# Patient Record
Sex: Female | Born: 1945 | Race: White | Hispanic: No | Smoking: Former smoker
Health system: Southern US, Community
[De-identification: ages and names within clinical notes are randomized; demographics above are authoritative.]

## PROBLEM LIST (undated history)

## (undated) DIAGNOSIS — F419 Anxiety disorder, unspecified: Secondary | ICD-10-CM

## (undated) DIAGNOSIS — H269 Unspecified cataract: Secondary | ICD-10-CM

## (undated) DIAGNOSIS — I10 Essential (primary) hypertension: Secondary | ICD-10-CM

## (undated) DIAGNOSIS — E7800 Pure hypercholesterolemia, unspecified: Secondary | ICD-10-CM

## (undated) DIAGNOSIS — F329 Major depressive disorder, single episode, unspecified: Secondary | ICD-10-CM

## (undated) DIAGNOSIS — F32A Depression, unspecified: Secondary | ICD-10-CM

## (undated) DIAGNOSIS — E079 Disorder of thyroid, unspecified: Secondary | ICD-10-CM

## (undated) HISTORY — DX: Unspecified cataract: H26.9

## (undated) HISTORY — DX: Anxiety disorder, unspecified: F41.9

## (undated) HISTORY — PX: TONSILLECTOMY: SUR1361

## (undated) HISTORY — PX: POLYPECTOMY: SHX149

## (undated) HISTORY — PX: REPLACEMENT TOTAL KNEE: SUR1224

## (undated) HISTORY — PX: CHOLECYSTECTOMY: SHX55

## (undated) HISTORY — PX: COLONOSCOPY: SHX174

---

## 1999-01-08 ENCOUNTER — Emergency Department (HOSPITAL_COMMUNITY): Admission: EM | Admit: 1999-01-08 | Discharge: 1999-01-08 | Payer: Self-pay | Admitting: Internal Medicine

## 1999-01-08 ENCOUNTER — Encounter: Payer: Self-pay | Admitting: Internal Medicine

## 1999-04-28 ENCOUNTER — Other Ambulatory Visit: Admission: RE | Admit: 1999-04-28 | Discharge: 1999-04-28 | Payer: Self-pay | Admitting: Obstetrics and Gynecology

## 1999-04-28 ENCOUNTER — Encounter (INDEPENDENT_AMBULATORY_CARE_PROVIDER_SITE_OTHER): Payer: Self-pay | Admitting: Specialist

## 1999-11-02 ENCOUNTER — Other Ambulatory Visit: Admission: RE | Admit: 1999-11-02 | Discharge: 1999-11-02 | Payer: Self-pay | Admitting: Obstetrics and Gynecology

## 1999-11-18 ENCOUNTER — Encounter: Payer: Self-pay | Admitting: Internal Medicine

## 1999-11-18 ENCOUNTER — Ambulatory Visit (HOSPITAL_COMMUNITY): Admission: RE | Admit: 1999-11-18 | Discharge: 1999-11-18 | Payer: Self-pay | Admitting: Internal Medicine

## 1999-11-19 ENCOUNTER — Ambulatory Visit (HOSPITAL_COMMUNITY): Admission: RE | Admit: 1999-11-19 | Discharge: 1999-11-19 | Payer: Self-pay | Admitting: Internal Medicine

## 1999-11-19 ENCOUNTER — Encounter: Payer: Self-pay | Admitting: Internal Medicine

## 2000-01-08 ENCOUNTER — Encounter (INDEPENDENT_AMBULATORY_CARE_PROVIDER_SITE_OTHER): Payer: Self-pay | Admitting: *Deleted

## 2000-01-08 ENCOUNTER — Ambulatory Visit (HOSPITAL_COMMUNITY): Admission: RE | Admit: 2000-01-08 | Discharge: 2000-01-08 | Payer: Self-pay | Admitting: *Deleted

## 2000-09-01 ENCOUNTER — Other Ambulatory Visit: Admission: RE | Admit: 2000-09-01 | Discharge: 2000-09-01 | Payer: Self-pay | Admitting: Urology

## 2000-09-06 ENCOUNTER — Other Ambulatory Visit: Admission: RE | Admit: 2000-09-06 | Discharge: 2000-09-06 | Payer: Self-pay | Admitting: Urology

## 2000-09-12 ENCOUNTER — Encounter: Payer: Self-pay | Admitting: General Surgery

## 2000-09-12 ENCOUNTER — Encounter: Admission: RE | Admit: 2000-09-12 | Discharge: 2000-09-12 | Payer: Self-pay | Admitting: General Surgery

## 2000-09-15 ENCOUNTER — Encounter: Payer: Self-pay | Admitting: General Surgery

## 2000-09-16 ENCOUNTER — Observation Stay (HOSPITAL_COMMUNITY): Admission: RE | Admit: 2000-09-16 | Discharge: 2000-09-17 | Payer: Self-pay | Admitting: General Surgery

## 2000-09-16 ENCOUNTER — Encounter (INDEPENDENT_AMBULATORY_CARE_PROVIDER_SITE_OTHER): Payer: Self-pay | Admitting: Specialist

## 2000-12-03 ENCOUNTER — Ambulatory Visit (HOSPITAL_COMMUNITY): Admission: RE | Admit: 2000-12-03 | Discharge: 2000-12-03 | Payer: Self-pay | Admitting: Endocrinology

## 2000-12-03 ENCOUNTER — Encounter: Payer: Self-pay | Admitting: Endocrinology

## 2000-12-08 ENCOUNTER — Other Ambulatory Visit: Admission: RE | Admit: 2000-12-08 | Discharge: 2000-12-08 | Payer: Self-pay | Admitting: Obstetrics and Gynecology

## 2000-12-13 ENCOUNTER — Encounter: Admission: RE | Admit: 2000-12-13 | Discharge: 2000-12-23 | Payer: Self-pay | Admitting: Orthopedic Surgery

## 2000-12-29 ENCOUNTER — Emergency Department (HOSPITAL_COMMUNITY): Admission: EM | Admit: 2000-12-29 | Discharge: 2000-12-29 | Payer: Self-pay | Admitting: *Deleted

## 2001-04-11 ENCOUNTER — Encounter: Payer: Self-pay | Admitting: Endocrinology

## 2001-04-11 ENCOUNTER — Encounter: Admission: RE | Admit: 2001-04-11 | Discharge: 2001-04-11 | Payer: Self-pay | Admitting: Endocrinology

## 2002-01-02 ENCOUNTER — Other Ambulatory Visit: Admission: RE | Admit: 2002-01-02 | Discharge: 2002-01-02 | Payer: Self-pay | Admitting: Obstetrics and Gynecology

## 2002-03-26 ENCOUNTER — Emergency Department (HOSPITAL_COMMUNITY): Admission: EM | Admit: 2002-03-26 | Discharge: 2002-03-26 | Payer: Self-pay | Admitting: Emergency Medicine

## 2002-03-26 ENCOUNTER — Encounter: Payer: Self-pay | Admitting: Emergency Medicine

## 2003-05-09 ENCOUNTER — Other Ambulatory Visit: Admission: RE | Admit: 2003-05-09 | Discharge: 2003-05-09 | Payer: Self-pay | Admitting: Obstetrics and Gynecology

## 2004-01-02 ENCOUNTER — Emergency Department (HOSPITAL_COMMUNITY): Admission: EM | Admit: 2004-01-02 | Discharge: 2004-01-02 | Payer: Self-pay | Admitting: Emergency Medicine

## 2004-03-30 ENCOUNTER — Other Ambulatory Visit: Admission: RE | Admit: 2004-03-30 | Discharge: 2004-03-30 | Payer: Self-pay | Admitting: Obstetrics and Gynecology

## 2005-04-08 ENCOUNTER — Other Ambulatory Visit: Admission: RE | Admit: 2005-04-08 | Discharge: 2005-04-08 | Payer: Self-pay | Admitting: Obstetrics and Gynecology

## 2005-10-27 ENCOUNTER — Ambulatory Visit: Payer: Self-pay | Admitting: Internal Medicine

## 2005-11-01 ENCOUNTER — Encounter (INDEPENDENT_AMBULATORY_CARE_PROVIDER_SITE_OTHER): Payer: Self-pay | Admitting: *Deleted

## 2005-11-01 ENCOUNTER — Ambulatory Visit: Payer: Self-pay | Admitting: Internal Medicine

## 2008-10-04 HISTORY — PX: JOINT REPLACEMENT: SHX530

## 2009-08-08 ENCOUNTER — Ambulatory Visit (HOSPITAL_BASED_OUTPATIENT_CLINIC_OR_DEPARTMENT_OTHER): Admission: RE | Admit: 2009-08-08 | Discharge: 2009-08-08 | Payer: Self-pay | Admitting: Specialist

## 2009-09-02 ENCOUNTER — Emergency Department (HOSPITAL_COMMUNITY): Admission: EM | Admit: 2009-09-02 | Discharge: 2009-09-02 | Payer: Self-pay | Admitting: Emergency Medicine

## 2009-10-04 HISTORY — PX: FOOT SURGERY: SHX648

## 2009-11-28 ENCOUNTER — Inpatient Hospital Stay (HOSPITAL_COMMUNITY): Admission: AD | Admit: 2009-11-28 | Discharge: 2009-12-02 | Payer: Self-pay | Admitting: Specialist

## 2009-12-16 ENCOUNTER — Ambulatory Visit: Admission: RE | Admit: 2009-12-16 | Discharge: 2009-12-16 | Payer: Self-pay | Admitting: Specialist

## 2009-12-16 ENCOUNTER — Encounter (INDEPENDENT_AMBULATORY_CARE_PROVIDER_SITE_OTHER): Payer: Self-pay | Admitting: Specialist

## 2009-12-16 ENCOUNTER — Ambulatory Visit: Payer: Self-pay | Admitting: Surgery

## 2010-02-26 ENCOUNTER — Telehealth: Payer: Self-pay | Admitting: Internal Medicine

## 2010-02-27 ENCOUNTER — Encounter (INDEPENDENT_AMBULATORY_CARE_PROVIDER_SITE_OTHER): Payer: Self-pay | Admitting: *Deleted

## 2010-02-27 ENCOUNTER — Telehealth: Payer: Self-pay | Admitting: Internal Medicine

## 2010-04-10 ENCOUNTER — Encounter (INDEPENDENT_AMBULATORY_CARE_PROVIDER_SITE_OTHER): Payer: Self-pay | Admitting: *Deleted

## 2010-04-15 ENCOUNTER — Ambulatory Visit: Payer: Self-pay | Admitting: Internal Medicine

## 2010-04-15 ENCOUNTER — Encounter (INDEPENDENT_AMBULATORY_CARE_PROVIDER_SITE_OTHER): Payer: Self-pay | Admitting: *Deleted

## 2010-04-22 ENCOUNTER — Ambulatory Visit: Payer: Self-pay | Admitting: Internal Medicine

## 2010-04-24 ENCOUNTER — Encounter: Payer: Self-pay | Admitting: Internal Medicine

## 2010-04-28 ENCOUNTER — Encounter: Admission: RE | Admit: 2010-04-28 | Discharge: 2010-04-28 | Payer: Self-pay | Admitting: Otolaryngology

## 2010-10-23 ENCOUNTER — Ambulatory Visit
Admission: RE | Admit: 2010-10-23 | Discharge: 2010-10-23 | Payer: Self-pay | Source: Home / Self Care | Attending: Specialist | Admitting: Specialist

## 2010-11-02 ENCOUNTER — Ambulatory Visit
Admission: RE | Admit: 2010-11-02 | Discharge: 2010-11-03 | Payer: Self-pay | Source: Home / Self Care | Attending: Specialist | Admitting: Specialist

## 2010-11-02 LAB — POCT I-STAT 4, (NA,K, GLUC, HGB,HCT)
Glucose, Bld: 89 mg/dL (ref 70–99)
HCT: 44 % (ref 36.0–46.0)
Hemoglobin: 15 g/dL (ref 12.0–15.0)
Sodium: 143 mEq/L (ref 135–145)

## 2010-11-03 NOTE — Letter (Signed)
Summary: Previsit letter  [facility]  [address] [place], [place] [address]   Phone: [phone]  Fax: [phone]       02/27/2010 MRN: [id number]  Dawn Kim [address] [place], [place]  [address]  Dear [name],  Welcome to the Gastroenterology Division at [facility].    You are scheduled to see a nurse for your pre-procedure visit on 04-15-10 at 10am, on the 3rd floor at [facility], [address]  We ask that you try to arrive at our office 15 minutes prior to your appointment time to allow for check-in.  Your nurse visit will consist of discussing your medical and surgical history, your immediate family medical history, and your medications.    Please bring a complete list of all your medications or, if you prefer, bring the medication bottles and we will list them.  We will need to be aware of both prescribed and over the counter drugs.  We will need to know exact dosage information as well.  If you are on blood thinners (Coumadin, Plavix, Aggrenox, Ticlid, etc.) please call our office today/prior to your appointment, as we need to consult with your physician about holding your medication.   Please be prepared to read and sign documents such as consent forms, a financial agreement, and acknowledgement forms.  If necessary, and with your consent, a friend or relative is welcome to sit-in on the nurse visit with you.  Please bring your insurance card so that we may make a copy of it.  If your insurance requires a referral to see a specialist, please bring your referral form from your primary care physician.  No co-pay is required for this nurse visit.     If you cannot keep your appointment, please call [phone] to cancel or reschedule prior to your appointment date.  This allows [place] the opportunity to schedule an appointment for another patient in need of care.    Thank you for choosing [facility] for your medical needs.   We appreciate the opportunity to care for you.  Please visit [place] at our website  to learn more about our practice.                     Sincerely.                                                                                                                   The Gastroenterology Division  Appended Document: Previsit letter Letter mailed to patient.

## 2010-11-03 NOTE — Procedures (Signed)
Summary: Colonoscopy   Colonoscopy  Procedure date:  11/01/2005  Findings:      Location:  [facility].  Pathology:  Adenomatous polyp.        Results: Diverticulosis.         Procedures Next Due Date:    Colonoscopy: 11/2008  Colonoscopy  Procedure date:  11/01/2005  Findings:      Location:  [facility].  Pathology:  Adenomatous polyp.        Results: Diverticulosis.         Procedures Next Due Date:    Colonoscopy: 11/2008 Patient Name: Dawn Kim, Dawn Kim. MRN:  Procedure Procedures: Colonoscopy CPT: [phone].    with polypectomy. CPT: A3573898.  Personnel: Endoscopist: Wilhemina Bonito. Marina Goodell, MD.  Exam Location: Exam performed in Outpatient Clinic. Outpatient  Patient Consent: Procedure, Alternatives, Risks and Benefits discussed, consent obtained, from patient. Consent was obtained by the RN.  Indications Symptoms: Constipation Abdominal pain / bloating.  Average Risk Screening Routine.  History  Current Medications: Patient is not currently taking Coumadin.  Pre-Exam Physical: Performed Nov 01, 2005. Cardio-pulmonary exam, Rectal exam, Abdominal exam, Mental status exam WNL.  Comments: Pt. history reviewed/updated, physical exam performed prior to initiation of sedation? yes Exam Exam: Extent of exam reached: Cecum, extent intended: Cecum.  The cecum was identified by appendiceal orifice and IC valve. Patient position: on left side. The Cecum was reached at 3:20 PM. ended at 3:35 PM. Time for Withdrawl: 00:15. Colon retroflexion performed. Images taken. ASA Classification: II. Tolerance: excellent.  Monitoring: Pulse and BP monitoring, Oximetry used. Supplemental O2 given.  Colon Prep Used Miralax for colon prep. Prep results: good.  Sedation Meds: Patient assessed and found to be appropriate for moderate (conscious) sedation. Fentanyl 100 mcg. given IV. Versed 10 mg. given IV.  Findings POLYP: Hepatic Flexure, diminutive, Procedure:   snare without cautery, removed, retrieved, Polyp sent to pathology. ICD9: Colon Polyps: 211. 3.  NORMAL EXAM: Cecum to Rectum.  MULTIPLE POLYPS: Cecum. minimum size 3 mm, maximum size 6 mm. Procedure:  snare without cautery, removed, retrieved, 3 polyps Polyps sent to pathology. ICD9: Colon Polyps: 211.3.  - DIVERTICULOSIS: Descending Colon to Sigmoid Colon. ICD9: Diverticulosis, Colon: 562.10.   Assessment  Diagnoses: 211.3: Colon Polyps.  562.10: Diverticulosis, Colon.   Events  Unplanned Interventions: No intervention was required.  Unplanned Events: There were no complications. Plans Disposition: After procedure patient sent to recovery. After recovery patient sent home.  Scheduling/Referral: Colonoscopy, to Wilhemina Bonito. Marina Goodell, MD, in 3 years if polyps adenomatous; otherwise 5 years,    This report was created from the original endoscopy report, which was reviewed and signed by the above listed endoscopist.   cc:  Adela Lank, MD      Harold Hedge, MD      The Patient

## 2010-11-03 NOTE — Miscellaneous (Signed)
Summary: [facility] Previsit/prep  Clinical Lists Changes  Medications: Added new medication of MOVIPREP 100 GM  SOLR (PEG-KCL-NACL-NASULF-NA ASC-C) As per prep instructions. - Signed Rx of MOVIPREP 100 GM  SOLR (PEG-KCL-NACL-NASULF-NA ASC-C) As per prep instructions.;  #1 x 0;  Signed;  Entered by: Wyona Almas RN;  Authorized by: Hilarie Fredrickson MD;  Method used: Electronically to [id number]*, [address], [place], [place]  [address], Ph: [phone], Fax: [phone] Observations: Added new observation of NKA: T (04/15/2010 9:34)    Prescriptions: MOVIPREP 100 GM  SOLR (PEG-KCL-NACL-NASULF-NA ASC-C) As per prep instructions.  #1 x 0   Entered by:   Wyona Almas RN   Authorized by:   Hilarie Fredrickson MD   Signed by:   Wyona Almas RN on 04/15/2010   Method used:   Electronically to        [id number] (retail)       [address] [place], [place]  [address]       Ph: [phone]       Fax: [phone]   RxID:   [id number]

## 2010-11-03 NOTE — Letter (Signed)
Summary: Anticoag letter  Anticoag letter   Imported By: Lester Stanchfield 04/22/2010 10:21:27  _____________________________________________________________________  External Attachment:    Type:   Image     Comment:   External Document

## 2010-11-03 NOTE — Procedures (Signed)
Summary: Colonoscopy  Patient: Dawn Kim Note: All result statuses are Final unless otherwise noted.  Tests: (1) Colonoscopy (COL)   COL Colonoscopy           DONE     [facility]     [address]     [place], [place]  [address]           COLONOSCOPY PROCEDURE REPORT           PATIENT:  [name], [name]  MR#:  [id number]     BIRTHDATE:  1945/12/02, 63 yrs. old  GENDER:  female     ENDOSCOPIST:  Wilhemina Bonito. Eda Keys, MD     REF. BY:  Surveillance Program Recall     PROCEDURE DATE:  04/22/2010     PROCEDURE:  Colonoscopy with snare polypectomy x 12     EXTENDED SERVICE MODIFIER FOR TIME     (>25 MIN) AND MULT. POLYPS (12)     ASA CLASS:  Class II     INDICATIONS:  history of pre-cancerous (adenomatous) colon polyps,     surveillance and high-risk screening ; index exam 10-2005 w/ mult.     adenomas     MEDICATIONS:   Fentanyl 100 mcg IV, Versed 10 mg IV           DESCRIPTION OF PROCEDURE:   After the risks benefits and     alternatives of the procedure were thoroughly explained, informed     consent was obtained.  Digital rectal exam was performed and     revealed no abnormalities.   The LB [id number] endoscope     was introduced through the anus and advanced to the cecum, which     was identified by both the appendix and ileocecal valve, without     limitations.Time to cecum = 3:31 min.  The quality of the prep was     good, using MoviPrep.  The instrument was then slowly withdrawn     (time = 22:16 min) as the colon was fully examined.     <<PROCEDUREIMAGES>>           FINDINGS:  There were multiplesessile polyps measuring between 3mm     and 11mm. these were located in the cecum (3), ascending colon     (7), and transverse colon (2). The majority looked adenomatous w/     some hyperplastic appearring lesions . All Polyps were snared     without cautery and removed entirely. Retrieval was successful.     Severe diverticulosis was found in the sigmoid colon.    Retroflexed     views in the rectum revealed no abnormalities.    The scope was     then withdrawn from the patient and the procedure completed.           COMPLICATIONS:  None     ENDOSCOPIC IMPRESSION:     1) Polyps, multiple (12) - Removed     2) Severe diverticulosis in the sigmoid colon           RECOMMENDATIONS:     1) Follow up colonoscopy in 2 years           ______________________________     Wilhemina Bonito. Eda Keys, MD           CC:  Adela Lank, MD; The Patient           n.     eSIGNED:   Wilhemina Bonito. Eda Keys at 04/22/2010 12:15  PM           [place]ice[place], Wente, [id number]  Note: An exclamation mark (!) indicates a result that was not dispersed into the flowsheet. Document Creation Date: 04/22/2010 12:16 PM _______________________________________________________________________  (1) Order result status: Final Collection or observation date-time: 04/22/2010 11:56 Requested date-time:  Receipt date-time:  Reported date-time:  Referring Physician:   Ordering Physician: Fransico Setters [phone]) Specimen Source:  Source: Launa Grill Order Number: [id number] Lab site:   Appended Document: Colonoscopy recall 2 yrs     Procedures Next Due Date:    Colonoscopy: 05/2012

## 2010-11-03 NOTE — Letter (Signed)
Summary: [facility] Instructions  [facility]  [address] [place], [place] [address]   Phone: [phone]  Fax: [phone]       EMI LYMON    September 11, 2001    MRN: [id number]        Procedure Day Dorna Bloom:   Wednesday   [birth year]     Arrival Time:   9:30 a.m.     Procedure Time:   10:30 a.m.     Location of Procedure:                    _x _  [facility] (4th Floor)   PREPARATION FOR COLONOSCOPY WITH MOVIPREP   Starting 67 days prior to your procedure  7-67-11 do not eat nuts, seeds, popcorn, corn, beans, peas,  salads, or any raw vegetables.  Do not take any fiber supplements (e.g. Metamucil, Citrucel, and Benefiber).  THE DAY BEFORE YOUR PROCEDURE         DATE:  04-21[birth year]  DAY: Tuesday  1.  Drink clear liquids the entire day-NO SOLID FOOD  2.  Do not drink anything colored red or purple.  Avoid juices with pulp.  No orange juice.  3.  Drink at least 64 oz. (8 glasses) of fluid/clear liquids during the day to prevent dehydration and help the prep work efficiently.  CLEAR LIQUIDS INCLUDE: Water Jello Ice Popsicles Tea (sugar ok, no milk/cream) Powdered fruit flavored drinks Coffee (sugar ok, no milk/cream) Gatorade Juice: apple, white grape, white cranberry  Lemonade Clear bullion, consomm, broth Carbonated beverages (any kind) Strained chicken noodle soup Hard Candy                             4.  In the morning, mix first dose of MoviPrep solution:    Empty 1 Pouch A and 1 Pouch B into the disposable container    Add lukewarm drinking water to the top line of the container. Mix to dissolve    Refrigerate (mixed solution should be used within 24 hrs)  5.  Begin drinking the prep at 5:00 p.m. The MoviPrep container is divided by 4 marks.   Every 15 minutes drink the solution down to the next mark (approximately 8 oz) until the full liter is complete.   6.  Follow completed prep with 16 oz of clear liquid of your choice (Nothing red or  purple).  Continue to drink clear liquids until bedtime.  7.  Before going to bed, mix second dose of MoviPrep solution:    Empty 1 Pouch A and 1 Pouch B into the disposable container    Add lukewarm drinking water to the top line of the container. Mix to dissolve    Refrigerate  THE DAY OF YOUR PROCEDURE      DATE: [birth year]22[birth year] DAY: Wednesday  Beginning at  5:30 a.m. (5 hours before procedure):         1. Every 15 minutes, drink the solution down to the next mark (approx 8 oz) until the full liter is complete.  2. Follow completed prep with 16 oz. of clear liquid of your choice.    3. You may drink clear liquids until  8:30 a.m.  (2 HOURS BEFORE PROCEDURE).   MEDICATION INSTRUCTIONS  Unless otherwise instructed, you should take regular prescription medications with a small sip of water   as early as possible the morning of your procedure.    Stop taking Coumadin  on  7/67/11  (5 days before procedure).  As instructed by PCP Dr. Adela Lank unless you hear differently from Dr. Marina Goodell.  Additional medication instructions:  Hold HCTZ the morning of procedures.         OTHER INSTRUCTIONS  You will need a responsible adult at least 67 years of age to accompany you and drive you home.   This person must remain in the waiting room during your procedure.  Wear loose fitting clothing that is easily removed.  Leave jewelry and other valuables at home.  However, you may wish to bring a book to read or  an iPod/MP3 player to listen to music as you wait for your procedure to start.  Remove all body piercing jewelry and leave at home.  Total time from sign-in until discharge is approximately 2-3 hours.  You should go home directly after your procedure and rest.  You can resume normal activities the  day after your procedure.  The day of your procedure you should not:   Drive   Make legal decisions   Operate machinery   Drink alcohol   Return to work  You will receive  specific instructions about eating, activities and medications before you leave.    The above instructions have been reviewed and explained to me by   Wyona Almas RN  [birth year] 13[birth year] 10:24 AM    I fully understand and can verbalize these instructions _____________________________ Date _________

## 2010-11-03 NOTE — Letter (Signed)
Summary: Patient Notice- Polyp Results  [facility]  [address] [place], [place] [address]   Phone: [phone]  Fax: [phone]        April 24, 2010 MRN: [id number]    Dawn Kim [address] [place], [place]  [address]    Dear [name],  I am pleased to inform you that the colon polyps removed during your recent colonoscopy were found to be benign (no cancer detected) upon pathologic examination.  Because of the number and type of polyps,I recommend you have a repeat colonoscopy examination in 2 years to look for recurrent polyps, as having colon polyps increases your risk for having recurrent polyps or even colon cancer in the future.  Should you develop new or worsening symptoms of abdominal pain, bowel habit changes or bleeding from the rectum or bowels, please schedule an evaluation with either your primary care physician or with me.   Additional information/recommendations:  __ No further action with gastroenterology is needed at this time. Please      follow-up with your primary care physician for your other healthcare      needs.     Please call us if you are having persistent problems or have questions about your condition that have not been fully answered at this time.  Sincerely,  Hilarie Fredrickson MD  This letter has been electronically signed by your physician.  Appended Document: Patient Notice- Polyp Results letter mailed

## 2010-11-03 NOTE — Progress Notes (Signed)
Summary: Triage-Colonoscopy Scheduled  Phone Note Call from Patient Call back at Home Phone [phone]   Caller: Patient Call For: Dr. Marina Goodell Reason for Call: Talk to Nurse Summary of Call: pt is on Coumadin and will come off of it in July....wants to know when it will be okay to sch'd a recall Colon Initial call taken by: Karna Christmas,  Feb 27, 2010 8:33 AM  Follow-up for Phone Call        Last Colon 11-01-2005. Dr.[name] please advise.  Follow-up by: Laureen Ochs LPN,  Feb 27, 2010 8:51 AM  Additional Follow-up for Phone Call Additional follow up Details #1::        after she is off coumadin would be fine Additional Follow-up by: Hilarie Fredrickson MD,  Feb 27, 2010 11:28 AM    Additional Follow-up for Phone Call Additional follow up Details #2::    Above MD orders reviewed with patient. She has scheduled her previsit for 04-15-10 at 10am and her Colonoscopy for 04-22-10 at 10:30am. Pt. instructed to call back as needed.  Follow-up by: Laureen Ochs LPN,  Feb 27, 2010 11:45 AM

## 2010-11-03 NOTE — Letter (Signed)
Summary: Anticoagulation Modification Letter  [facility]  [address] [place], [place] [address]   Phone: [phone]  Fax: [phone]    April 15, 2010  Re:    Dawn Kim DOB:    1946-02-07 MRN:    [id number]    Dear Dr.Kohut, We have scheduled the above patient for an endoscopic procedure. Our records show that  she is on anticoagulation therapy. Please advise as to how long the patient may come off their therapy of Coumadin  prior to the scheduled procedure(s) on 04/28/10   Please fax back/or route the completed form to [place] at [phone]. Please fax response asap as pt. will have to come off med. tomorrow if you approve stopping it for 5 days..  Thank you for your help with this matter.  Sincerely,  Teryl Lucy RN   Physician Recommendation:  Hold Plavix 7 days prior ________________  Hold Coumadin 5 days prior ____________  Other ______________________________     Appended Document: Anticoagulation Modification Letter  Per Dr.[name] he talked with pt. and she is coming off Plavix all together on Friday so that will be enough time to be off it for procedure7/20/11.

## 2010-11-03 NOTE — Procedures (Signed)
Summary: EGD   EGD  Procedure date:  11/01/2005  Findings:      Location: [facility]  GERD  EGD  Procedure date:  11/01/2005  Findings:      Location: [facility]  GERD Patient Name: Kim, Dawn Kim. MRN:  Procedure Procedures: Panendoscopy (EGD) CPT: 43235.  Personnel: Endoscopist: Wilhemina Bonito. Marina Goodell, MD.  Exam Location: Exam performed in Outpatient Clinic. Outpatient  Patient Consent: Procedure, Alternatives, Risks Dawn Benefits discussed, consent obtained, from patient. Consent was obtained by the RN.  Indications Symptoms: Reflux symptoms for 6-10 yrs,  History  Current Medications: Patient is not currently taking Coumadin.  Pre-Exam Physical: Performed Nov 01, 2005  Cardio-pulmonary exam, Abdominal exam, Mental status exam WNL.  Comments: Pt. history reviewed/updated, physical exam performed prior to initiation of sedation?yes Exam Exam Info: Maximum depth of insertion Duodenum, intended Duodenum. Patient position: on left side. Vocal cords visualized. Gastric retroflexion performed. Images taken. ASA Classification: II. Tolerance: excellent.  Sedation Meds: Patient assessed Dawn found to be appropriate for moderate (conscious) sedation. Residual sedation present from prior procedure today. Versed 2 mg. given IV. Cetacaine Spray  Monitoring: BP Dawn pulse monitoring done. Oximetry used. Supplemental O2 given  Findings Normal: Proximal Esophagus to Duodenal 2nd Portion. Comments: small hiatal hernia.   Assessment  Diagnoses: 530.81: GERD.   Events  Unplanned Intervention: No unplanned interventions were required.  Unplanned Events: There were no complications. Plans Medication(s): Continue current medications.  Disposition: After procedure patient sent to recovery. After recovery patient sent home.  Scheduling: Follow-up prn.  Comments: Return to Dawn care of Dr. Juleen China  This report was created from Dawn original  endoscopy report, which was reviewed Dawn signed by Dawn above listed endoscopist.   cc:  Adela Lank, MD      Harold Hedge, MD      Dawn Patient

## 2010-11-03 NOTE — Letter (Signed)
Summary: Anticoag/[facility]  Anticoag/[facility]   Imported By: Lester [place] 04/28/2010 09:18:23  _____________________________________________________________________  External Attachment:    Type:   Image     Comment:   External Document

## 2010-11-03 NOTE — Procedures (Signed)
Summary: Colonoscopy   Colonoscopy  Procedure date:  11/01/2005  Findings:      Location:  [facility].  Pathology:  Adenomatous polyp.          Procedures Next Due Date:    Colonoscopy: 11/2008 Patient Name: Loula, Marcella. MRN:  Procedure Procedures: Colonoscopy CPT: [phone].    with polypectomy. CPT: A3573898.  Personnel: Endoscopist: Wilhemina Bonito. Marina Goodell, MD.  Exam Location: Exam performed in Outpatient Clinic. Outpatient  Patient Consent: Procedure, Alternatives, Risks and Benefits discussed, consent obtained, from patient. Consent was obtained by the RN.  Indications Symptoms: Constipation Abdominal pain / bloating.  Average Risk Screening Routine.  History  Current Medications: Patient is not currently taking Coumadin.  Pre-Exam Physical: Performed Nov 01, 2005. Cardio-pulmonary exam, Rectal exam, Abdominal exam, Mental status exam WNL.  Comments: Pt. history reviewed/updated, physical exam performed prior to initiation of sedation? yes Exam Exam: Extent of exam reached: Cecum, extent intended: Cecum.  The cecum was identified by appendiceal orifice and IC valve. Patient position: on left side. The Cecum was reached at 3:20 PM. ended at 3:35 PM. Time for Withdrawl: 00:15. Colon retroflexion performed. Images taken. ASA Classification: II. Tolerance: excellent.  Monitoring: Pulse and BP monitoring, Oximetry used. Supplemental O2 given.  Colon Prep Used Miralax for colon prep. Prep results: good.  Sedation Meds: Patient assessed and found to be appropriate for moderate (conscious) sedation. Fentanyl 100 mcg. given IV. Versed 10 mg. given IV.  Findings POLYP: Hepatic Flexure, diminutive, Procedure:  snare without cautery, removed, retrieved, Polyp sent to pathology. ICD9: Colon Polyps: 211. 3.  NORMAL EXAM: Cecum to Rectum.  MULTIPLE POLYPS: Cecum. minimum size 3 mm, maximum size 6 mm. Procedure:  snare without cautery, removed, retrieved, 3  polyps Polyps sent to pathology. ICD9: Colon Polyps: 211.3.  - DIVERTICULOSIS: Descending Colon to Sigmoid Colon. ICD9: Diverticulosis, Colon: 562.10.   Assessment  Diagnoses: 211.3: Colon Polyps.  562.10: Diverticulosis, Colon.   Events  Unplanned Interventions: No intervention was required.  Unplanned Events: There were no complications. Plans Disposition: After procedure patient sent to recovery. After recovery patient sent home.  Scheduling/Referral: Colonoscopy, to Wilhemina Bonito. Marina Goodell, MD, in 3 years if polyps adenomatous; otherwise 5 years,    This report was created from the original endoscopy report, which was reviewed and signed by the above listed endoscopist.   cc:  Adela Lank, MD      Harold Hedge, MD      The Patient

## 2010-11-03 NOTE — Progress Notes (Signed)
Summary: rec COL?  Phone Note Call from Patient Call back at Home Phone [phone]   Caller: Patient Call For: Dr. Marina Goodell Reason for Call: Talk to Nurse Summary of Call: pt is currently taking Coumadin, but states she will be going off of it in July... wants to speak with a nurse about when she should sch her REC COL Initial call taken by: Vallarie Mare,  Feb 26, 2010 1:31 PM

## 2010-11-07 LAB — ANAEROBIC CULTURE

## 2010-11-09 NOTE — Op Note (Signed)
  Dawn Kim, Dawn Kim                ACCOUNT NO.:  [id number]  MEDICAL RECORD NO.:  [id number]          PATIENT TYPE:  AMB  LOCATION:  [facility]                         FACILITY:  [facility]  PHYSICIAN:  Jene Every, M.D.    DATE OF BIRTH:  1946/02/07  DATE OF PROCEDURE:  11/02/2010 DATE OF DISCHARGE:                              OPERATIVE REPORT   PREOPERATIVE DIAGNOSIS:  Arthrofibrosis, status post total knee replacement left.  POSTOPERATIVE DIAGNOSIS:  Arthrofibrosis, status post total knee replacement left, with synovitis left knee.  PROCEDURE PERFORMED: 1. Left knee arthroscopy. 2. Extensive synovectomy. 3. Debridement of extensive arthrofibrosis. 4. Aspiration of the knee with fluid sent for cultures, aerobic and     anaerobic.  ANESTHESIA:  General.  ASSISTANT:  No assistant.  BRIEF HISTORY:  A 65 year old status post total knee replacement with knee pain and swelling and grinding consistent with arthrofibrosis, aspiration negative for infection, x-rays negative for loosening and was indicated for debridement of presumed arthrofibrosis and synovitis as well as repeat aspiration.  Risks and benefits were discussed including bleeding and infection, no change in symptoms, worsening in symptoms, need for repeat debridement, DVT, PE, anesthetic complications, etc. She also had a history of a DVT in the past, no PE.  Will put her on postoperative anticoagulant.  TECHNIQUE:  The patient was placed in the supine position.  After induction of adequate general anesthesia and 2 grams of Kefzol, 600 clindamycin and we prepped and draped the left knee in the usual sterile fashion.  There was a lateral, medial and a superomedial parapatellar portal.  It was fashioned with a number 11 blade.  Cannula atraumatically placed.  Fluid and clear synovium was evacuated.  15 mL were sent for culture.  Irrigant was utilized to insufflate the joint. Under direct visualization, we inserted the  camera.  Copiously lavaged the knee.  Inspection revealed the suprapatellar pouch near the patella. Hypertrophic synovitis and scar tissue was noted.  I introduced a shaver and performed an extensive synovectomy of the suprapatellar pouch, the gutters in the medial and lateral aspect of the gutter.  There was scar tissue interposing between the components medially.  This was debrided. We flushed and extended the knee.  There was no evidence of loosening. No adhesion of the patella ligament.  She had actually good range and good stability under exam.  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  Following the debridement, she had good flexion and extension without impingement and good stability.  After the full synovectomy and lysis of adhesions, we removed all instrumentation.  Portals were closed with 4-0 nylon simple sutures.  0.25% Marcaine with epinephrine was infiltrated into the joint.  The wound was dressed sterilely, awoken without difficulty and transported to the recovery room in satisfactorycondition.  The patient tolerated the procedure well.  There were no complications. No assistant.  Minimal bleeding.     Jene Every, M.D.     [name]  D:  11/02/2010  T:  11/02/2010  Job:  [id number]  Electronically Signed by Jene Every M.D. on 11/09/2010 02:15:25 PM

## 2010-12-25 LAB — CBC
HCT: 32.2 % — ABNORMAL LOW (ref 36.0–46.0)
HCT: 36.1 % (ref 36.0–46.0)
HCT: 38.8 % (ref 36.0–46.0)
HCT: 44.4 % (ref 36.0–46.0)
Hemoglobin: 11.1 g/dL — ABNORMAL LOW (ref 12.0–15.0)
Hemoglobin: 11.9 g/dL — ABNORMAL LOW (ref 12.0–15.0)
Hemoglobin: 15.2 g/dL — ABNORMAL HIGH (ref 12.0–15.0)
MCHC: 32.9 g/dL (ref 30.0–36.0)
MCHC: 34.1 g/dL (ref 30.0–36.0)
MCHC: 34.6 g/dL (ref 30.0–36.0)
MCV: 92.5 fL (ref 78.0–100.0)
MCV: 92.6 fL (ref 78.0–100.0)
MCV: 92.8 fL (ref 78.0–100.0)
MCV: 92.8 fL (ref 78.0–100.0)
Platelets: 180 K/uL (ref 150–400)
Platelets: 208 K/uL (ref 150–400)
RBC: 3.48 MIL/uL — ABNORMAL LOW (ref 3.87–5.11)
RBC: 3.89 MIL/uL (ref 3.87–5.11)
RBC: 4.18 MIL/uL (ref 3.87–5.11)
RBC: 4.78 MIL/uL (ref 3.87–5.11)
RDW: 12.5 % (ref 11.5–15.5)
RDW: 12.6 % (ref 11.5–15.5)
RDW: 12.6 % (ref 11.5–15.5)
WBC: 10.3 10*3/uL (ref 4.0–10.5)
WBC: 7.2 10*3/uL (ref 4.0–10.5)
WBC: 7.3 K/uL (ref 4.0–10.5)
WBC: 7.9 K/uL (ref 4.0–10.5)

## 2010-12-25 LAB — COMPREHENSIVE METABOLIC PANEL
ALT: 28 U/L (ref 0–35)
BUN: 18 mg/dL (ref 6–23)
CO2: 31 mEq/L (ref 19–32)
GFR calc non Af Amer: >60 mL/min (ref >60)
Sodium: 141 mEq/L (ref 135–145)
Total Bilirubin: 0.8 mg/dL (ref 0.3–1.2)
Total Protein: 6.7 g/dL (ref 6.0–8.3)

## 2010-12-25 LAB — PROTIME-INR
INR: 0.87 (ref 0.00–1.49)
INR: 0.98 (ref 0.00–1.49)
INR: 2.06 — ABNORMAL HIGH (ref 0.00–1.49)
Prothrombin Time: 11.7 seconds (ref 11.6–15.2)
Prothrombin Time: 12.9 s (ref 11.6–15.2)
Prothrombin Time: 23 s — ABNORMAL HIGH (ref 11.6–15.2)

## 2010-12-25 LAB — BASIC METABOLIC PANEL
Calcium: 8.5 mg/dL (ref 8.4–10.5)
Chloride: 101 mEq/L (ref 96–112)
Creatinine, Ser: 0.86 mg/dL (ref 0.4–1.2)
GFR calc Af Amer: >60 mL/min (ref >60)
GFR calc non Af Amer: >60 mL/min (ref >60)

## 2010-12-25 LAB — BASIC METABOLIC PANEL WITH GFR
BUN: 6 mg/dL (ref 6–23)
BUN: 8 mg/dL (ref 6–23)
CO2: 32 meq/L (ref 19–32)
CO2: 32 meq/L (ref 19–32)
Calcium: 8.1 mg/dL — ABNORMAL LOW (ref 8.4–10.5)
Calcium: 8.2 mg/dL — ABNORMAL LOW (ref 8.4–10.5)
Chloride: 100 meq/L (ref 96–112)
Chloride: 102 meq/L (ref 96–112)
Creatinine, Ser: 0.72 mg/dL (ref 0.4–1.2)
Creatinine, Ser: 0.77 mg/dL (ref 0.4–1.2)
GFR calc non Af Amer: >60 mL/min
GFR calc non Af Amer: >60 mL/min
Glucose, Bld: 136 mg/dL — ABNORMAL HIGH (ref 70–99)
Glucose, Bld: 99 mg/dL (ref 70–99)
Potassium: 3.3 meq/L — ABNORMAL LOW (ref 3.5–5.1)
Potassium: 3.5 meq/L (ref 3.5–5.1)
Sodium: 139 meq/L (ref 135–145)
Sodium: 140 meq/L (ref 135–145)

## 2010-12-25 LAB — DIFFERENTIAL
Basophils Absolute: 0 10*3/uL (ref 0.0–0.1)
Basophils Relative: 0 % (ref 0–1)
Eosinophils Relative: 2 % (ref 0–5)
Monocytes Absolute: 0.6 10*3/uL (ref 0.1–1.0)
Neutro Abs: 4.4 10*3/uL (ref 1.7–7.7)
Neutrophils Relative %: 61 % (ref 43–77)

## 2010-12-25 LAB — URINALYSIS, ROUTINE W REFLEX MICROSCOPIC
Glucose, UA: NEGATIVE mg/dL
Hgb urine dipstick: NEGATIVE
Ketones, ur: NEGATIVE mg/dL
Protein, ur: NEGATIVE mg/dL

## 2010-12-25 LAB — TYPE AND SCREEN
ABO/RH(D): O NEG
Antibody Screen: NEGATIVE

## 2010-12-25 LAB — APTT: aPTT: 24 seconds (ref 24–37)

## 2010-12-25 LAB — ABO/RH: ABO/RH(D): O NEG

## 2010-12-28 LAB — BASIC METABOLIC PANEL
BUN: 10 mg/dL (ref 6–23)
GFR calc non Af Amer: >60 mL/min (ref >60)
Glucose, Bld: 102 mg/dL — ABNORMAL HIGH (ref 70–99)
Potassium: 3.4 mEq/L — ABNORMAL LOW (ref 3.5–5.1)

## 2010-12-28 LAB — CBC
HCT: 33.4 % — ABNORMAL LOW (ref 36.0–46.0)
MCV: 92.7 fL (ref 78.0–100.0)
Platelets: 205 10*3/uL (ref 150–400)
RDW: 12.7 % (ref 11.5–15.5)

## 2011-01-06 LAB — POCT I-STAT 4, (NA,K, GLUC, HGB,HCT)
Hemoglobin: 16 g/dL — ABNORMAL HIGH (ref 12.0–15.0)
Sodium: 139 mEq/L (ref 135–145)

## 2011-01-06 LAB — POCT PREGNANCY, URINE: Preg Test, Ur: NEGATIVE

## 2011-02-19 NOTE — Op Note (Signed)
[facility]  Patient:    Dawn Kim, Dawn Kim                       MRN: [id number] Proc. Date: 09/16/00 Adm. Date:  [id number] Disc. Date: [id number] Attending:  Brandy Hale CC:         Bernadene Person, M.D.  Ronald L. Ovidio Hanger, M.D.   Operative Report  PREOPERATIVE DIAGNOSIS:  Chronic cholecystitis with cholelithiasis.  POSTOPERATIVE DIAGNOSIS:  Chronic cholecystitis with cholelithiasis.  OPERATION PERFORMED:  Laparoscopic cholecystectomy.  SURGEON:  Angelia Mould. Derrell Lolling, M.D.  FIRST ASSISTANT:  Velora Heckler, M.D.  OPERATIVE INDICATIONS:  This is a 65 year old white female who presents with a several-month history of intermittent episodes of right upper quadrant pain and nausea.  This is occurring twice a week and lasts about 15 minutes and then will resolve.  She is currently being worked up for microscopic hematuria and no abnormal findings to date.  She had a gallbladder ultrasound which showed numerous gallstones but normal common bile ducts in diameter.  Liver function tests are normal.  It is felt that she is having recurrent biliary colic.  She is brought to the operating room electively for cholecystectomy.  OPERATIVE FINDINGS:  The gallbladder was chronically inflamed and somewhat discolored.  The liver appeared healthy.  The anatomy of the cystic duct, cystic artery and common bile duct were conventional.  The small intestine, large intestine, stomach and duodenum were all normal to inspection.  OPERATIVE TECHNIQUE:  Following the induction of general endotracheal anesthesia, the patients abdomen was prepped and draped in a sterile fashion. Marcaine 0.5% with epinephrine was used as a local infiltration anesthetic.  A vertical incision was made at the lower end of the umbilicus.  The fascia was incised in the midline and the abdominal cavity entered under direct vision. A 10-mm Hasson trocar was inserted and secured with a  pursestring suture of 0 Vicryl.  Pneumoperitoneum was created.  Video camera was inserted, with visualization and findings as described above.  A 10-mm trocar was placed in the subxiphoid region and two 5-mm trocars placed in the right mid-abdomen. The gallbladder was elevated and placed on traction.  We dissected out the cystic duct and cystic artery.  We dissected all of the tissue away from the cystic artery and cystic duct until we had a clear window behind both structures.  We clearly identified the junction of the cystic duct with the gallbladder and clearly identified the region of the common bile duct.  The cystic duct and cystic artery were then secured with metal clips and divided. The gallbladder was dissected from its bed with electrocautery and removed. The operative field and subphrenic spaces were irrigated with saline.  At the completion of the case, there was no bleeding and no bile leak whatsoever. The trocars were removed under direct vision and there was no bleeding from the trocar sites.  The pneumoperitoneum was released.  The fascia at the umbilicus was closed with 0 Vicryl sutures.  Skin incisions were closed with subcuticular sutures of 4-0 Vicryl and Steri-Strips.  Clean bandages were placed and the patient taken to recovery room in stable condition.  Estimated blood loss was about 10 cc.  Complications:  None.  Sponge, needle and instrument counts were correct. DD:  09/16/00 TD:  09/18/00 Job: [id number]

## 2011-02-19 NOTE — Procedures (Signed)
[facility]  Patient:    Dawn Kim, Dawn Kim                       MRN: [id number] Proc. Date: 01/08/00 Adm. Date:  [id number] Attending:  Sabino Gasser                           Procedure Report  PROCEDURE:  Colonoscopy with polypectomy.  GASTROENTEROLOGIST:  Sabino Gasser, M.D.  INDICATIONS:  Polyps seen on flexible sigmoidoscopy.  ANESTHESIA:  Demerol 80 mg, Versed 8 mg given intravenously in divided dose.  PROCEDURE IN DETAIL:  With the patient mildly sedated and in the left lateral decubitus position, the Olympus video colonoscope was inserted in the rectum and then passed under direct vision to the cecum.  The cecum was identified by ileocecal valve and appendiceal orifice, both of which were photographed.  From  this point, the colonoscope was slowly withdrawn, taking circumferential views f the entire colonic mucosa, stopping only then in the rectum which appeared normal except for the polyps seen on direct view, and on retroflexed view, internal hemorrhoids were seen.  Subsequently, the endoscope was straightened, and the polyp was resected using snare cautery technique, snaring with 25/25 blended current.  There was good hemostasis.  The patients vital signs and pulse oximetry remained stable.  The patient tolerated the procedure well with no apparent complications.  FINDINGS:  Polypoid rectum.  PLAN:  Await biopsy report.  The patient will call me for results and follow up  with me as an outpatient.DD:  01/08/00 TD:  01/09/00 Job: [id number]

## 2011-03-05 ENCOUNTER — Telehealth: Payer: Self-pay | Admitting: Internal Medicine

## 2011-03-05 NOTE — Telephone Encounter (Signed)
Pt is complaining of constipation. She states that Dr. Marina Goodell had mentioned to her in the past that she could try Miralax. Pt wants to know if it is ok for her to take this. Pt informed that Miralax is what we recommend and she can get it over the counter from the pharmacy. Pt verbalized understanding.

## 2011-10-04 ENCOUNTER — Ambulatory Visit (INDEPENDENT_AMBULATORY_CARE_PROVIDER_SITE_OTHER): Payer: MEDICARE

## 2011-10-04 DIAGNOSIS — F339 Major depressive disorder, recurrent, unspecified: Secondary | ICD-10-CM

## 2011-10-04 DIAGNOSIS — F341 Dysthymic disorder: Secondary | ICD-10-CM

## 2011-10-04 DIAGNOSIS — F410 Panic disorder [episodic paroxysmal anxiety] without agoraphobia: Secondary | ICD-10-CM

## 2012-01-20 ENCOUNTER — Ambulatory Visit (INDEPENDENT_AMBULATORY_CARE_PROVIDER_SITE_OTHER): Payer: MEDICARE | Admitting: Internal Medicine

## 2012-01-20 VITALS — BP 144/90 | HR 76 | Temp 98.4°F | Resp 16 | Ht 61.5 in | Wt 195.0 lb

## 2012-01-20 DIAGNOSIS — E7800 Pure hypercholesterolemia, unspecified: Secondary | ICD-10-CM | POA: Insufficient documentation

## 2012-01-20 DIAGNOSIS — S91109A Unspecified open wound of unspecified toe(s) without damage to nail, initial encounter: Secondary | ICD-10-CM

## 2012-01-20 DIAGNOSIS — I10 Essential (primary) hypertension: Secondary | ICD-10-CM

## 2012-01-20 DIAGNOSIS — F39 Unspecified mood [affective] disorder: Secondary | ICD-10-CM

## 2012-01-20 DIAGNOSIS — E789 Disorder of lipoprotein metabolism, unspecified: Secondary | ICD-10-CM

## 2012-01-20 NOTE — Patient Instructions (Signed)
Wound Care Wound care helps prevent pain and infection.  You may need a tetanus shot if:  You cannot remember when you had your last tetanus shot.   You have never had a tetanus shot.   The injury broke your skin.  If you need a tetanus shot and you choose not to have one, you may get tetanus. Sickness from tetanus can be serious. HOME CARE   Only take medicine as told by your doctor.   Clean the wound daily with mild soap and water.   Change any bandages (dressings) as told by your doctor.   Put medicated cream and a bandage on the wound as told by your doctor.   Change the bandage if it gets wet, dirty, or starts to smell.   Take showers. Do not take baths, swim, or do anything that puts your wound under water.   Rest and raise (elevate) the wound until the pain and puffiness (swelling) are better.   Keep all doctor visits as told.  GET HELP RIGHT AWAY IF:   Yellowish-white fluid (pus) comes from the wound.   Medicine does not lessen your pain.   There is a red streak going away from the wound.   You cannot move your finger or toe.   You have a fever.  MAKE SURE YOU:   Understand these instructions.   Will watch your condition.   Will get help right away if you are not doing well or get worse.  Document Released: 06/29/2008 Document Revised: 09/09/2011 Document Reviewed: 01/24/2011 [employer] Patient Information 2012 [employer]. 

## 2012-01-20 NOTE — Progress Notes (Signed)
  Subjective:    Patient ID: Dawn Kim, female    DOB: 03-13-1946, 66 y.o.   MRN: [id number]  HPI    Review of Systems     Objective:   Physical Exam  L great toe-2% plain lidocaine 3cc/side digital block.  Sterile P&D.  Lateral nail removed.  She does have a new nail that appears to be growing in nicely from the matrix. Xeroform. Bandage.      Assessment & Plan:  Toe care h/o

## 2012-01-20 NOTE — Progress Notes (Signed)
  Subjective:    Patient ID: Dawn Kim, female    DOB: 1946-03-28, 66 y.o.   MRN: [id number]  HPI Stubbed toe 3 weeks ago and nail partially avulsed. Has pulled portions of nail off, now irregular and not healing comfortably for her. Requests nail removal to help nail grow back properly.   Review of Systems    see problem list Objective:   Physical Exam Left great toe no signs of infection NMV intact       Assessment & Plan:  Wound care by PA Ms. McClung, see her note

## 2012-02-23 DIAGNOSIS — M7989 Other specified soft tissue disorders: Secondary | ICD-10-CM

## 2012-04-03 ENCOUNTER — Encounter: Payer: Self-pay | Admitting: Internal Medicine

## 2012-05-09 ENCOUNTER — Emergency Department (HOSPITAL_COMMUNITY): Payer: No Typology Code available for payment source

## 2012-05-09 ENCOUNTER — Emergency Department (HOSPITAL_COMMUNITY)
Admission: EM | Admit: 2012-05-09 | Discharge: 2012-05-09 | Disposition: A | Discharge status: Home / Self Care | Payer: No Typology Code available for payment source | Attending: Emergency Medicine | Admitting: Emergency Medicine

## 2012-05-09 ENCOUNTER — Encounter (HOSPITAL_COMMUNITY): Payer: Self-pay

## 2012-05-09 DIAGNOSIS — M542 Cervicalgia: Secondary | ICD-10-CM | POA: Insufficient documentation

## 2012-05-09 DIAGNOSIS — R10814 Left lower quadrant abdominal tenderness: Secondary | ICD-10-CM | POA: Insufficient documentation

## 2012-05-09 DIAGNOSIS — M25559 Pain in unspecified hip: Secondary | ICD-10-CM | POA: Insufficient documentation

## 2012-05-09 DIAGNOSIS — S20229A Contusion of unspecified back wall of thorax, initial encounter: Secondary | ICD-10-CM | POA: Insufficient documentation

## 2012-05-09 DIAGNOSIS — R079 Chest pain, unspecified: Secondary | ICD-10-CM | POA: Insufficient documentation

## 2012-05-09 DIAGNOSIS — S300XXA Contusion of lower back and pelvis, initial encounter: Secondary | ICD-10-CM

## 2012-05-09 DIAGNOSIS — I10 Essential (primary) hypertension: Secondary | ICD-10-CM | POA: Insufficient documentation

## 2012-05-09 DIAGNOSIS — M545 Low back pain, unspecified: Secondary | ICD-10-CM | POA: Insufficient documentation

## 2012-05-09 DIAGNOSIS — K5730 Diverticulosis of large intestine without perforation or abscess without bleeding: Secondary | ICD-10-CM | POA: Insufficient documentation

## 2012-05-09 HISTORY — DX: Depression, unspecified: F32.A

## 2012-05-09 HISTORY — DX: Disorder of thyroid, unspecified: E07.9

## 2012-05-09 HISTORY — DX: Pure hypercholesterolemia, unspecified: E78.00

## 2012-05-09 HISTORY — DX: Major depressive disorder, single episode, unspecified: F32.9

## 2012-05-09 HISTORY — DX: Essential (primary) hypertension: I10

## 2012-05-09 LAB — CBC WITH DIFFERENTIAL/PLATELET
Basophils Absolute: 0 10*3/uL (ref 0.0–0.1)
Eosinophils Absolute: 0.1 10*3/uL (ref 0.0–0.7)
Lymphs Abs: 2 10*3/uL (ref 0.7–4.0)
MCH: 30.2 pg (ref 26.0–34.0)
Neutrophils Relative %: 61 % (ref 43–77)
Platelets: 203 10*3/uL (ref 150–400)
RBC: 4.73 MIL/uL (ref 3.87–5.11)
WBC: 6.4 10*3/uL (ref 4.0–10.5)

## 2012-05-09 LAB — COMPREHENSIVE METABOLIC PANEL
ALT: 22 U/L (ref 0–35)
AST: 21 U/L (ref 0–37)
Albumin: 3.9 g/dL (ref 3.5–5.2)
Alkaline Phosphatase: 48 U/L (ref 39–117)
GFR calc Af Amer: >90 mL/min (ref >90)
Glucose, Bld: 88 mg/dL (ref 70–99)
Potassium: 3.7 mEq/L (ref 3.5–5.1)
Sodium: 140 mEq/L (ref 135–145)
Total Protein: 6.7 g/dL (ref 6.0–8.3)

## 2012-05-09 LAB — URINALYSIS, ROUTINE W REFLEX MICROSCOPIC
Bilirubin Urine: NEGATIVE
Glucose, UA: NEGATIVE mg/dL
Hgb urine dipstick: NEGATIVE
Specific Gravity, Urine: 1.025 (ref 1.005–1.030)
pH: 6 (ref 5.0–8.0)

## 2012-05-09 MED ORDER — IOHEXOL 300 MG/ML  SOLN
100.0000 mL | Freq: Once | INTRAMUSCULAR | Status: AC | PRN
  Administered 2012-05-09: 100 mL via INTRAVENOUS

## 2012-05-09 MED ORDER — ONDANSETRON HCL 4 MG/2ML IJ SOLN
4.0000 mg | Freq: Once | INTRAMUSCULAR | Status: AC
  Administered 2012-05-09: 4 mg via INTRAVENOUS
  Filled 2012-05-09: qty 2

## 2012-05-09 MED ORDER — MORPHINE SULFATE 4 MG/ML IJ SOLN
4.0000 mg | Freq: Once | INTRAMUSCULAR | Status: AC
  Administered 2012-05-09: 4 mg via INTRAVENOUS
  Filled 2012-05-09: qty 1

## 2012-05-09 NOTE — ED Notes (Signed)
Dr. Jeraldine Loots at the bedside and Dr. Christain Sacramento at the bedside.
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
## 2012-05-10 NOTE — ED Provider Notes (Signed)
  I performed a history and physical examination of Dawn Kim and discussed her management with Dr. Christain Sacramento.  I agree with the history, physical, assessment, and plan of care, with the following exceptions: None  Case well described by Dr. Christain Sacramento.   Date: 05/10/2012  Rate: 60  Rhythm: normal sinus rhythm  QRS Axis: normal  Intervals: normal  ST/T Wave abnormalities: nonspecific T wave changes  Conduction Disutrbances:nonspecific intraventricular conduction delay  Narrative Interpretation:   Old EKG Reviewed: unchanged    Chayanne Speir, Elvis Coil, MD 05/10/12 1600

## 2012-05-22 ENCOUNTER — Encounter: Payer: Self-pay | Admitting: Internal Medicine

## 2012-05-24 ENCOUNTER — Encounter: Payer: Self-pay | Admitting: Internal Medicine

## 2012-06-13 ENCOUNTER — Encounter: Payer: Self-pay | Admitting: Internal Medicine

## 2012-06-22 ENCOUNTER — Encounter: Payer: Self-pay | Admitting: Internal Medicine

## 2012-07-04 ENCOUNTER — Ambulatory Visit (INDEPENDENT_AMBULATORY_CARE_PROVIDER_SITE_OTHER): Payer: Medicare Other | Admitting: Family Medicine

## 2012-07-04 DIAGNOSIS — Z23 Encounter for immunization: Secondary | ICD-10-CM

## 2012-07-05 ENCOUNTER — Encounter: Payer: Self-pay | Admitting: Radiology

## 2012-07-05 ENCOUNTER — Telehealth: Payer: Self-pay

## 2012-07-05 NOTE — Telephone Encounter (Signed)
I have called patient to advise she states she has a rash and her face and lips are very swollen. She has recently started taking Celebrex. I advised her not to take this any more. She was advised to come in for [place] to look at her, especially since her lips and face are swollen. She states she can not afford to come here. I have advised her she needs someone to see her. She normally sees Dr Juleen China and his office prescribed the Celebrex. She advised she is going to call him immediately. She is given my name if she needs to call back here again, or if she can not get in touch with Dr Juleen China.   FYI

## 2012-07-05 NOTE — Telephone Encounter (Signed)
Pt was here yesterday for a flu shot her face is swollen this morning and would like a nurse to call her as soon as possible [phone]

## 2012-07-06 ENCOUNTER — Ambulatory Visit (INDEPENDENT_AMBULATORY_CARE_PROVIDER_SITE_OTHER): Payer: Medicare Other | Admitting: Family Medicine

## 2012-07-06 VITALS — BP 130/78 | HR 69 | Temp 97.8°F | Resp 18 | Ht 62.5 in | Wt 202.0 lb

## 2012-07-06 DIAGNOSIS — T7840XA Allergy, unspecified, initial encounter: Secondary | ICD-10-CM

## 2012-07-06 DIAGNOSIS — Z888 Allergy status to other drugs, medicaments and biological substances status: Secondary | ICD-10-CM

## 2012-07-06 MED ORDER — PREDNISONE 20 MG PO TABS: ORAL_TABLET | ORAL | Status: DC

## 2012-07-06 NOTE — Patient Instructions (Addendum)
Take zyrtec one daily for itching and allergy  Take prednisone as ordered  Return if worse.  If any mouth lesions or breathing problems occur please return  Discuss with your primary care whether you need further evaluation for the remote chance that this was from the recent amoxicillin

## 2012-07-06 NOTE — Progress Notes (Signed)
S: Took flu shot 2 days ago.  Yesterday awoke with swollen eyelids and lips and hives.  No prior allergy.    She was on amoxicillin until about 9/28.  O: Urticarial rash scattered all over.  Mild swelling of eyes and lips. No oral lesions  A: Flu shot allergy Recent amoxicillin exposure (4 days previous) and this remains a remote possibility of allergen in her

## 2012-07-08 ENCOUNTER — Telehealth: Payer: Self-pay

## 2012-07-08 NOTE — Telephone Encounter (Signed)
Normal side effect, correct?

## 2012-07-08 NOTE — Telephone Encounter (Signed)
Pt states that she was in our office recently for an allergic reaction to the flu-shot and was given prednisone. Pt suspects that the prednisone is causing her to be nervous and hyper. Please Advise. Best# [phone]

## 2012-07-09 NOTE — Telephone Encounter (Signed)
Spoke with patient yesterday and told her to d/c prednisone since welps are gone. Continue zyrtec and benadryl. Let us know if rash returns.

## 2012-07-28 ENCOUNTER — Ambulatory Visit (AMBULATORY_SURGERY_CENTER): Payer: Medicare Other

## 2012-07-28 VITALS — Ht 63.0 in | Wt 201.5 lb

## 2012-07-28 DIAGNOSIS — Z860100 Personal history of colon polyps, unspecified: Secondary | ICD-10-CM

## 2012-07-28 DIAGNOSIS — Z1211 Encounter for screening for malignant neoplasm of colon: Secondary | ICD-10-CM

## 2012-07-28 MED ORDER — MOVIPREP 100 G PO SOLR: ORAL | Status: DC

## 2012-08-09 ENCOUNTER — Telehealth: Payer: Self-pay | Admitting: *Deleted

## 2012-08-09 ENCOUNTER — Telehealth: Payer: Self-pay | Admitting: Internal Medicine

## 2012-08-09 NOTE — Telephone Encounter (Signed)
See other  Phone note that was opened.

## 2012-08-09 NOTE — Telephone Encounter (Signed)
Spoke with the patient, and she said that she had not had a BM since Saturday.   She wanted to take her  prep now.  I told her to take her prep around 3 pm if she wanted to start early.  She stated that she doesn't go to the BR without laxatives.  I told her to call the doc on call if she doesn't have a BM by at least 7pm tonight.  I told her that it was crucial that we knew.  She stated again that she didn't have BM's unless she had laxatives, and I told her that this prep would act as a strong laxative usually.  I reiterated that she needed to call us if the prep didn't work by [facility]. She agreed.

## 2012-08-10 ENCOUNTER — Encounter: Payer: Self-pay | Admitting: Internal Medicine

## 2012-08-10 ENCOUNTER — Ambulatory Visit (AMBULATORY_SURGERY_CENTER): Payer: Medicare Other | Admitting: Internal Medicine

## 2012-08-10 VITALS — BP 150/78 | HR 65 | Temp 98.5°F | Resp 27 | Ht 63.0 in | Wt 201.0 lb

## 2012-08-10 DIAGNOSIS — D126 Benign neoplasm of colon, unspecified: Secondary | ICD-10-CM

## 2012-08-10 DIAGNOSIS — K6389 Other specified diseases of intestine: Secondary | ICD-10-CM

## 2012-08-10 DIAGNOSIS — Z86010 Personal history of colonic polyps: Secondary | ICD-10-CM

## 2012-08-10 DIAGNOSIS — Z1211 Encounter for screening for malignant neoplasm of colon: Secondary | ICD-10-CM

## 2012-08-10 MED ORDER — SODIUM CHLORIDE 0.9 % IV SOLN: 500.0000 mL | INTRAVENOUS | Status: DC

## 2012-08-10 NOTE — Patient Instructions (Addendum)

## 2012-08-10 NOTE — Progress Notes (Signed)
Patient did not experience any of the following events: a burn prior to discharge; a fall within the facility; wrong site/side/patient/procedure/implant event; or a hospital transfer or hospital admission upon discharge from the facility. (G8907) Patient did not have preoperative order for IV antibiotic SSI prophylaxis. (G8918)  

## 2012-08-10 NOTE — Op Note (Signed)
[facility] [address] [place], [address]   COLONOSCOPY PROCEDURE REPORT  PATIENT: Dawn Kim, Dawn Kim.  MR#: [id number] BIRTHDATE: October 20, 1945 , 66  yrs. old GENDER: Female ENDOSCOPIST: Roxy Cedar, MD REFERRED AO:[id number] Program Recall PROCEDURE DATE:  08/10/2012 PROCEDURE:   Colonoscopy with snare polypectomy    x 6 ASA CLASS:   Class II INDICATIONS:patient's personal history of adenomatous colon polyps (index 2007; 2011 > 10 adenomas). MEDICATIONS: MAC sedation, administered by CRNA and propofol (Diprivan) 350mg  IV  DESCRIPTION OF PROCEDURE:   After the risks benefits and alternatives of the procedure were thoroughly explained, informed consent was obtained.  A digital rectal exam revealed no abnormalities of the rectum.   The LB CF-H180AL [id number] and LB CF-H180AL [id number]  endoscope was introduced through the anus and advanced to the cecum, which was identified by both the appendix and ileocecal valve. No adverse events experienced.   The quality of the prep was good, using MoviPrep  The instrument was then slowly withdrawn as the colon was fully examined.      COLON FINDINGS: Severe melanosis was found throughout the entire examined colon.   Six polyps ranging between 3-38mm in size were found in the ascending colon(3), transverse colon (2), and rectum. A polypectomy was performed with a cold snare.  The resection was complete and the polyp tissue was completely retrieved.   Mild diverticulosis was noted The finding was in the left colon. Retroflexed views revealed no abnormalities. The time to cecum=4 minutes 13 seconds.  Withdrawal time=15 minutes 25 seconds.  The scope was withdrawn and the procedure completed. COMPLICATIONS: There were no complications.  ENDOSCOPIC IMPRESSION: 1.   Severe melanosis was found throughout the entire examined colon  2.   Six polyps ranging between 3-22mm in size were found in the ascending colon, transverse  colon, and rectum; polypectomy was performed with a cold snare 3.   Mild diverticulosis was noted in the left colon  RECOMMENDATIONS: 1. Repeat Colonoscopy in 3 years.   eSigned:  Roxy Cedar, MD 08/10/2012 10:23 AM  cc: Adela Lank, MD and The Patient   PATIENT NAME:  Dawn Kim, Dawn Kim. MR#: [id number]

## 2012-08-11 ENCOUNTER — Telehealth: Payer: Self-pay | Admitting: *Deleted

## 2012-08-11 NOTE — Telephone Encounter (Signed)
  Follow up Call-  Call back number 08/10/2012  Post procedure Call Back phone  # [phone]  Permission to leave phone message Yes     Patient questions:  Do you have a fever, pain , or abdominal swelling? no Pain Score  0 *  Have you tolerated food without any problems? yes  Have you been able to return to your normal activities? yes  Do you have any questions about your discharge instructions: Diet   no Medications  no Follow up visit  no  Do you have questions or concerns about your Care? no  Actions: * If pain score is 4 or above: No action needed, pain <4.  Pt. Asked about meaning of melanosis.  Explained that melanosis was a staining of colon that occurs secondary to habitual laxative use.  States she takes a colon cleanse most days, and is dependent on it to have BM. Advised that she could add more fiber and water to her diet and that could help alleviate her constipation.  She did not seem to think she could do either of those things.  Advised her to discuss the "chronic" constipation with her PCP.

## 2012-08-15 ENCOUNTER — Encounter: Payer: Self-pay | Admitting: Internal Medicine

## 2012-10-11 ENCOUNTER — Other Ambulatory Visit (HOSPITAL_COMMUNITY): Payer: Self-pay | Admitting: Internal Medicine

## 2012-10-11 DIAGNOSIS — I82409 Acute embolism and thrombosis of unspecified deep veins of unspecified lower extremity: Secondary | ICD-10-CM

## 2012-11-02 ENCOUNTER — Ambulatory Visit (HOSPITAL_COMMUNITY)
Admission: RE | Admit: 2012-11-02 | Discharge: 2012-11-02 | Disposition: A | Discharge status: Home / Self Care | Payer: Medicare Other | Source: Ambulatory Visit | Attending: Internal Medicine | Admitting: Internal Medicine

## 2012-11-02 ENCOUNTER — Encounter (HOSPITAL_COMMUNITY): Payer: Medicare Other

## 2012-11-02 DIAGNOSIS — I82409 Acute embolism and thrombosis of unspecified deep veins of unspecified lower extremity: Secondary | ICD-10-CM

## 2012-11-02 NOTE — Progress Notes (Signed)
Bilateral lower ext. Venous duplex completed. Maxwell Caul

## 2013-03-19 ENCOUNTER — Encounter: Payer: Self-pay | Admitting: Internal Medicine

## 2013-04-23 ENCOUNTER — Ambulatory Visit: Payer: Medicare Other | Admitting: Internal Medicine

## 2013-05-02 ENCOUNTER — Ambulatory Visit (INDEPENDENT_AMBULATORY_CARE_PROVIDER_SITE_OTHER): Payer: Medicare Other | Admitting: Family Medicine

## 2013-05-02 VITALS — BP 150/88 | HR 64 | Temp 98.6°F | Resp 16 | Ht 62.5 in | Wt 195.0 lb

## 2013-05-02 DIAGNOSIS — J309 Allergic rhinitis, unspecified: Secondary | ICD-10-CM

## 2013-05-02 DIAGNOSIS — J019 Acute sinusitis, unspecified: Secondary | ICD-10-CM

## 2013-05-02 MED ORDER — AMOXICILLIN 500 MG PO TABS: 500.0000 mg | ORAL_TABLET | Freq: Two times a day (BID) | ORAL | Status: DC

## 2013-05-02 NOTE — Progress Notes (Signed)
[facility]:  Office Visit  Chief Complaint:  Chief Complaint  Patient presents with  . Sore Throat    3 weeks  . Cough    Chest congestion    HPI: Dawn Kim is a 67 y.o. female who complains of  2-3 week history of sinus congestion with ear pressure/pain. She has tried loratidine without releif. Denies allergies and asthma. Occ nonproductive cough. Denies fevers, chills. She does not have chest congestion or chest pressure daily, just intermittently when she coughs, daytime coughing.   Past Medical History  Diagnosis Date  . Thyroid disease   . Depression   . Hypercholesteremia   . Hypertension   . Cataract     Bil   Past Surgical History  Procedure Laterality Date  . Joint replacement  2010    left knee  . Foot surgery  2011    right foot  . Cholecystectomy    . Colonoscopy    . Polypectomy    . Tonsillectomy     History   Social History  . Marital Status: Married    Spouse Name: N/A    Number of Children: N/A  . Years of Education: N/A   Social History Main Topics  . Smoking status: Former Smoker    Types: Cigarettes    Quit date: 11/04/2009  . Smokeless tobacco: Never Used  . Alcohol Use: No  . Drug Use: No  . Sexually Active: None   Other Topics Concern  . None   Social History Narrative  . None   Family History  Problem Relation Age of Onset  . Heart disease Mother    Allergies  Allergen Reactions  . Influenza Vaccines Hives and Rash   Prior to Admission medications   Medication Sig Start Date End Date Taking? Authorizing Provider  ALPRAZolam (XANAX) 0.25 MG tablet Take 0.25 mg by mouth 3 (three) times daily as needed. For anxiety   Yes Historical Provider, MD  calcium carbonate (OS-CAL - DOSED IN MG OF ELEMENTAL CALCIUM) 1250 MG tablet Take 1 tablet by mouth 2 (two) times daily.    Yes Historical Provider, MD  celecoxib (CELEBREX) 200 MG capsule Take 200 mg by mouth daily.   Yes Historical Provider, MD  fish  oil-omega-3 fatty acids 1000 MG capsule Take 600 mg by mouth 2 (two) times daily.    Yes Historical Provider, MD  gabapentin (NEURONTIN) 300 MG capsule Take 300 mg by mouth at bedtime.   Yes Historical Provider, MD  hydrochlorothiazide (MICROZIDE) 12.5 MG capsule Take 12.5 mg by mouth daily.   Yes Historical Provider, MD  HYDROcodone-acetaminophen (NORCO) 7.5-325 MG per tablet Take 1 tablet by mouth every 6 (six) hours as needed.   Yes Historical Provider, MD  levothyroxine (SYNTHROID, LEVOTHROID) 137 MCG tablet Take 137 mcg by mouth daily.   Yes Historical Provider, MD  Multiple Vitamin (MULTIVITAMIN) tablet Take 1 tablet by mouth daily.   Yes Historical Provider, MD  omeprazole (PRILOSEC) 40 MG capsule Take 40 mg by mouth daily.   Yes Historical Provider, MD  simvastatin (ZOCOR) 40 MG tablet Take 40 mg by mouth every evening.   Yes Historical Provider, MD  traZODone (DESYREL) 150 MG tablet Take 150 mg by mouth at bedtime.   Yes Historical Provider, MD  venlafaxine XR (EFFEXOR-XR) 75 MG 24 hr capsule Take 75 mg by mouth daily.   Yes Historical Provider, MD  VOLTAREN 1 % GEL  07/26/12  Yes Historical Provider, MD  zolpidem (  AMBIEN) 10 MG tablet Take 10 mg by mouth at bedtime.   Yes Historical Provider, MD  aspirin 81 MG tablet Take 81 mg by mouth daily.    Historical Provider, MD     ROS: The patient denies fevers, chills, night sweats, unintentional weight loss, chest pain, palpitations, wheezing, , nausea, vomiting, abdominal pain, dysuria, hematuria, melena, numbness, weakness, or tingling.  All other systems have been reviewed and were otherwise negative with the exception of those mentioned in the HPI and as above.    PHYSICAL EXAM: Filed Vitals:   05/02/13 0851  BP: 150/88  Pulse: 64  Temp: 98.6 F (37 C)  Resp: 16   Filed Vitals:   05/02/13 0851  Height: 5' 2.5" (1.588 m)  Weight: 195 lb (88.451 kg)   Body mass index is 35.08 kg/(m^2).  General: Alert, no acute  distress HEENT:  Normocephalic, atraumatic, oropharynx patent. Allergic shiners, + boggy nares, inflammation, + max sinus tenderness . No exudates, tm nl Cardiovascular:  Regular rate and rhythm, no rubs murmurs or gallops.  No Carotid bruits, radial pulse intact. No pedal edema.  Respiratory: Clear to auscultation bilaterally.  No wheezes, rales, or rhonchi.  No cyanosis, no use of accessory musculature GI: No organomegaly, abdomen is soft and non-tender, positive bowel sounds.  No masses. Skin: No rashes. Neurologic: Facial musculature symmetric. Psychiatric: Patient is appropriate throughout our interaction. Lymphatic: No cervical lymphadenopathy Musculoskeletal: Gait intact.   LABS: Results for orders placed during the hospital encounter of 05/09/12  CBC WITH DIFFERENTIAL      Result Value Range   WBC 6.4  4.0 - 10.5 K/uL   RBC 4.73  3.87 - 5.11 MIL/uL   Hemoglobin 14.3  12.0 - 15.0 g/dL   HCT 16.1  09.6 - 04.5 %   MCV 90.7  78.0 - 100.0 fL   MCH 30.2  26.0 - 34.0 pg   MCHC 33.3  30.0 - 36.0 g/dL   RDW 40.9  81.1 - 91.4 %   Platelets 203  150 - 400 K/uL   Neutrophils Relative % 61  43 - 77 %   Neutro Abs 3.9  1.7 - 7.7 K/uL   Lymphocytes Relative 31  12 - 46 %   Lymphs Abs 2.0  0.7 - 4.0 K/uL   Monocytes Relative 7  3 - 12 %   Monocytes Absolute 0.4  0.1 - 1.0 K/uL   Eosinophils Relative 2  0 - 5 %   Eosinophils Absolute 0.1  0.0 - 0.7 K/uL   Basophils Relative 0  0 - 1 %   Basophils Absolute 0.0  0.0 - 0.1 K/uL  COMPREHENSIVE METABOLIC PANEL      Result Value Range   Sodium 140  135 - 145 mEq/L   Potassium 3.7  3.5 - 5.1 mEq/L   Chloride 102  96 - 112 mEq/L   CO2 30  19 - 32 mEq/L   Glucose, Bld 88  70 - 99 mg/dL   BUN 15  6 - 23 mg/dL   Creatinine, Ser 7.82  0.50 - 1.10 mg/dL   Calcium 9.0  8.4 - 95.6 mg/dL   Total Protein 6.7  6.0 - 8.3 g/dL   Albumin 3.9  3.5 - 5.2 g/dL   AST 21  0 - 37 U/L   ALT 22  0 - 35 U/L   Alkaline Phosphatase 48  39 - 117 U/L   Total  Bilirubin 0.4  0.3 - 1.2 mg/dL   GFR  calc non Af Amer 84 (*) >90 mL/min   GFR calc Af Amer >90  >90 mL/min  LACTIC ACID, PLASMA      Result Value Range   Lactic Acid, Venous 1.1  0.5 - 2.2 mmol/L  URINALYSIS, ROUTINE W REFLEX MICROSCOPIC      Result Value Range   Color, Urine YELLOW  YELLOW   APPearance CLEAR  CLEAR   Specific Gravity, Urine 1.025  1.005 - 1.030   pH 6.0  5.0 - 8.0   Glucose, UA NEGATIVE  NEGATIVE mg/dL   Hgb urine dipstick NEGATIVE  NEGATIVE   Bilirubin Urine NEGATIVE  NEGATIVE   Ketones, ur NEGATIVE  NEGATIVE mg/dL   Protein, ur NEGATIVE  NEGATIVE mg/dL   Urobilinogen, UA 0.2  0.0 - 1.0 mg/dL   Nitrite NEGATIVE  NEGATIVE   Leukocytes, UA NEGATIVE  NEGATIVE     EKG/XRAY:   Primary read interpreted by Dr. Conley Rolls at [facility].   ASSESSMENT/PLAN: Encounter Diagnoses  Name Primary?  . Sinusitis, acute Yes  . Allergic rhinitis    She cannot afford flonase  Advise to do saline nasal flushes C/w antihistamine Rx amoxacillin BID x 10 days    Audreyana Huntsberry PHUONG, DO 05/02/2013 9:29 AM

## 2013-05-02 NOTE — Patient Instructions (Signed)

## 2013-05-11 ENCOUNTER — Ambulatory Visit (INDEPENDENT_AMBULATORY_CARE_PROVIDER_SITE_OTHER): Payer: Medicare Other | Admitting: Family Medicine

## 2013-05-11 VITALS — BP 132/78 | HR 69 | Temp 97.6°F | Resp 17 | Ht 62.5 in | Wt 198.0 lb

## 2013-05-11 DIAGNOSIS — T7840XA Allergy, unspecified, initial encounter: Secondary | ICD-10-CM

## 2013-05-11 MED ORDER — PREDNISONE 20 MG PO TABS: ORAL_TABLET | ORAL | Status: DC

## 2013-05-11 NOTE — Progress Notes (Signed)
[address]   [place], [place]  [address]   [phone]  Subjective:    Patient ID: Dawn Kim, female    DOB: 04/02/1946, 67 y.o.   MRN: [id number]  HPI This 67 y.o. female presents for evaluation of allergic reaction. Evaluated on 04/24/13 treated for sinusitis; prescribed Amoxicillin.  Yesterday broke out in horrible rash all over, eye swelling. Took last dose of Amoxicillin yesterday.  Not sure if allergic to Amoxicillin.  Last night, rash was worse.  Extremities, torso involved.  Applied alcohol which helped with itch.  No Benadryl.  Taking OTC allergy medication.   No tongue swelling; no throat swelling; no sore throat; +hoarseness; eyes swollen.  Onset yesterday.  Last year 07/2012, had allergic reaction after flu vaccine; had also taken amoxicillin around the time of flu vaccine.   Review of Systems  Constitutional: Negative for chills, diaphoresis and fatigue.  HENT: Positive for facial swelling, trouble swallowing and voice change. Negative for sore throat.   Respiratory: Negative for shortness of breath, wheezing and stridor.   Gastrointestinal: Negative for nausea, vomiting and diarrhea.  Skin: Positive for rash.   Past Medical History  Diagnosis Date  . Thyroid disease   . Depression   . Hypercholesteremia   . Hypertension   . Cataract     Bil   Current Outpatient Prescriptions on File Prior to Visit  Medication Sig Dispense Refill  . ALPRAZolam (XANAX) 0.25 MG tablet Take 0.25 mg by mouth 3 (three) times daily as needed. For anxiety      . calcium carbonate (OS-CAL - DOSED IN MG OF ELEMENTAL CALCIUM) 1250 MG tablet Take 1 tablet by mouth 2 (two) times daily.       . fish oil-omega-3 fatty acids 1000 MG capsule Take 600 mg by mouth 2 (two) times daily.       [name] gabapentin (NEURONTIN) 300 MG capsule Take 300 mg by mouth at bedtime.      . hydrochlorothiazide (MICROZIDE) 12.5 MG capsule Take 12.5 mg by mouth daily.      [name] HYDROcodone-acetaminophen (NORCO) 7.5-325 MG  per tablet Take 1 tablet by mouth every 6 (six) hours as needed.      [name] levothyroxine (SYNTHROID, LEVOTHROID) 137 MCG tablet Take 137 mcg by mouth daily.      . Multiple Vitamin (MULTIVITAMIN) tablet Take 1 tablet by mouth daily.      [name] omeprazole (PRILOSEC) 40 MG capsule Take 40 mg by mouth daily.      . simvastatin (ZOCOR) 40 MG tablet Take 40 mg by mouth every evening.      . traZODone (DESYREL) 150 MG tablet Take 150 mg by mouth at bedtime.      [name] venlafaxine XR (EFFEXOR-XR) 75 MG 24 hr capsule Take 75 mg by mouth daily.      . VOLTAREN 1 % GEL       . zolpidem (AMBIEN) 10 MG tablet Take 10 mg by mouth at bedtime.      [name] amoxicillin (AMOXIL) 500 MG tablet Take 1 tablet (500 mg total) by mouth 2 (two) times daily.  20 tablet  0   No current facility-administered medications on file prior to visit.   Allergies  Allergen Reactions  . Influenza Vaccines Hives and Rash       Objective:   Physical Exam  Nursing note and vitals reviewed. Constitutional: She is oriented to person, place, and time. She appears well-developed and well-nourished. No distress.  HENT:  Head: Normocephalic  and atraumatic.  Right Ear: External ear normal.  Left Ear: External ear normal.  Nose: Nose normal.  Mouth/Throat: Oropharynx is clear and moist and mucous membranes are normal. No posterior oropharyngeal edema.  Eyes: EOM are normal. Pupils are equal, round, and reactive to light. Right conjunctiva is not injected. Left conjunctiva is not injected.  Upper eyelid swelling.  Neck: Normal range of motion. Neck supple. No JVD present. No tracheal deviation present. No thyromegaly present.  Cardiovascular: Normal rate, regular rhythm and normal heart sounds.  Exam reveals no gallop and no friction rub.   No murmur heard. Pulmonary/Chest: Effort normal and breath sounds normal. No stridor. No respiratory distress. She has no wheezes.  Lymphadenopathy:    She has no cervical adenopathy.  Neurological: She is  alert and oriented to person, place, and time.  Skin: Skin is warm and dry. Rash noted. She is not diaphoretic.  Diffuse erythematous rash arms, legs, torso.  Facial eyelid swelling upper lids only.    Psychiatric: She has a normal mood and affect. Her behavior is normal.       Assessment & Plan:  Allergic reaction, initial encounter - Plan: predniSONE (DELTASONE) 20 MG tablet   1.  Allergic Reaction Initial Encounter: New.  Allergy to Amoxicillin.  Advised to no longer take Amoxicillin and should avoid in future.  Rx for Prednisone provided.  Continue daily antihistamine.  Start Benadryl 25mg  qhs.  Start Ranitidine 150mg  bid.  To ED for SOB, throat swelling, worsening facial swelling. 2.  Amoxicillin allergy.  Meds ordered this encounter  Medications  . predniSONE (DELTASONE) 20 MG tablet    Sig: Three tablets daily x 1 day then two tablets daily x 5 days then one tablet daily x 5 days    Dispense:  18 tablet    Refill:  0

## 2013-05-11 NOTE — Patient Instructions (Addendum)
1.  Take antihistamine once daily.   2.  Take Benadryl 25mg  one at bedtime for one week. 3.  Take Zantac/Ranitidine 150mg  one tablet twice daily for the next week. 4.  PRESENT TO EMERGENCY DEPARTMENT IF BECOMES SHORT OF BREATH.

## 2013-05-12 ENCOUNTER — Telehealth: Payer: Self-pay

## 2013-05-12 NOTE — Telephone Encounter (Signed)
Pt states that her sob has gotten better and she knows that she needs to be seen or go to ER.  She has a couple of spots that has shown up on up on her thighs after just taking one dose of prednisone.  Advised pt that she just have to give it a little more time and to make sure she is taking the antihistamines.

## 2013-05-12 NOTE — Telephone Encounter (Signed)
Pt was here yesterday for a rash. The rash is getting worse also feeling slight shortness of breath.  Would like to speak with someone about when the rash should clear up. Please call back at [phone].

## 2013-05-13 ENCOUNTER — Encounter (HOSPITAL_COMMUNITY): Payer: Self-pay | Admitting: *Deleted

## 2013-05-13 ENCOUNTER — Emergency Department (HOSPITAL_COMMUNITY): Payer: Medicare Other

## 2013-05-13 ENCOUNTER — Emergency Department (HOSPITAL_COMMUNITY)
Admission: EM | Admit: 2013-05-13 | Discharge: 2013-05-13 | Disposition: A | Discharge status: Home / Self Care | Payer: Medicare Other | Attending: Emergency Medicine | Admitting: Emergency Medicine

## 2013-05-13 DIAGNOSIS — J3489 Other specified disorders of nose and nasal sinuses: Secondary | ICD-10-CM | POA: Insufficient documentation

## 2013-05-13 DIAGNOSIS — R21 Rash and other nonspecific skin eruption: Secondary | ICD-10-CM | POA: Insufficient documentation

## 2013-05-13 DIAGNOSIS — T360X5A Adverse effect of penicillins, initial encounter: Secondary | ICD-10-CM | POA: Insufficient documentation

## 2013-05-13 DIAGNOSIS — IMO0002 Reserved for concepts with insufficient information to code with codable children: Secondary | ICD-10-CM | POA: Insufficient documentation

## 2013-05-13 DIAGNOSIS — F3289 Other specified depressive episodes: Secondary | ICD-10-CM | POA: Insufficient documentation

## 2013-05-13 DIAGNOSIS — Z87891 Personal history of nicotine dependence: Secondary | ICD-10-CM | POA: Insufficient documentation

## 2013-05-13 DIAGNOSIS — Z79899 Other long term (current) drug therapy: Secondary | ICD-10-CM | POA: Insufficient documentation

## 2013-05-13 DIAGNOSIS — T7840XD Allergy, unspecified, subsequent encounter: Secondary | ICD-10-CM

## 2013-05-13 DIAGNOSIS — E079 Disorder of thyroid, unspecified: Secondary | ICD-10-CM | POA: Insufficient documentation

## 2013-05-13 DIAGNOSIS — I10 Essential (primary) hypertension: Secondary | ICD-10-CM | POA: Insufficient documentation

## 2013-05-13 DIAGNOSIS — F329 Major depressive disorder, single episode, unspecified: Secondary | ICD-10-CM | POA: Insufficient documentation

## 2013-05-13 DIAGNOSIS — Z8669 Personal history of other diseases of the nervous system and sense organs: Secondary | ICD-10-CM | POA: Insufficient documentation

## 2013-05-13 DIAGNOSIS — J029 Acute pharyngitis, unspecified: Secondary | ICD-10-CM | POA: Insufficient documentation

## 2013-05-13 DIAGNOSIS — E7800 Pure hypercholesterolemia, unspecified: Secondary | ICD-10-CM | POA: Insufficient documentation

## 2013-05-13 LAB — COMPREHENSIVE METABOLIC PANEL
ALT: 17 U/L (ref 0–35)
Alkaline Phosphatase: 41 U/L (ref 39–117)
CO2: 31 mEq/L (ref 19–32)
Chloride: 101 mEq/L (ref 96–112)
GFR calc Af Amer: 83 mL/min — ABNORMAL LOW (ref >90)
GFR calc non Af Amer: 72 mL/min — ABNORMAL LOW (ref >90)
Glucose, Bld: 115 mg/dL — ABNORMAL HIGH (ref 70–99)
Potassium: 3.7 mEq/L (ref 3.5–5.1)
Sodium: 141 mEq/L (ref 135–145)

## 2013-05-13 LAB — CBC WITH DIFFERENTIAL/PLATELET
Eosinophils Relative: 0 % (ref 0–5)
Lymphocytes Relative: 13 % (ref 12–46)
Lymphs Abs: 1.1 10*3/uL (ref 0.7–4.0)
MCV: 93 fL (ref 78.0–100.0)
Neutro Abs: 7.1 10*3/uL (ref 1.7–7.7)
Neutrophils Relative %: 85 % — ABNORMAL HIGH (ref 43–77)
Platelets: 211 10*3/uL (ref 150–400)
RBC: 4.85 MIL/uL (ref 3.87–5.11)
WBC: 8.4 10*3/uL (ref 4.0–10.5)

## 2013-05-13 MED ORDER — DIPHENHYDRAMINE HCL 50 MG/ML IJ SOLN
25.0000 mg | Freq: Once | INTRAMUSCULAR | Status: AC
  Administered 2013-05-13: 25 mg via INTRAVENOUS
  Filled 2013-05-13: qty 1

## 2013-05-13 MED ORDER — FAMOTIDINE IN NACL 20-0.9 MG/50ML-% IV SOLN
20.0000 mg | Freq: Once | INTRAVENOUS | Status: AC
  Administered 2013-05-13: 20 mg via INTRAVENOUS
  Filled 2013-05-13: qty 50

## 2013-05-13 NOTE — ED Provider Notes (Signed)
TIME SEEN: 11:07 AM  CHIEF COMPLAINT: Skin rash  HPI: Patient is a 67 y.o. Caucasian female with a history of hypothyroidism, depression, hypertension, hyperlipidemia who presents emergency Department with skin rash since 3 days ago. She reports that she has been having sinus congestion, rhinorrhea, sore throat and hoarse voice since last week. She was put on amoxicillin for which he took for 8 days had a ten-day course. She states she began developing high after 8 days of being on amoxicillin and was seen at a urgent care facility who told her she was having an allergic reaction to amoxicillin. They started her on prednisone and Benadryl.. She reports that her rash has not gotten much better. She states she also feels mildly short of breath. No wheezing. No angioedema. No difficulty swallowing. No lightheadedness. Patient has had amoxicillin in the past with similar reaction.  ROS: See HPI Constitutional: no fever  Eyes: no drainage  ENT: no runny nose   Cardiovascular:  no chest pain  Resp: no SOB  GI: no vomiting GU: no dysuria Integumentary: no rash  Allergy: Possible hives Musculoskeletal: no leg swelling  Neurological: no slurred speech ROS otherwise negative  PAST MEDICAL HISTORY/PAST SURGICAL HISTORY:  Past Medical History  Diagnosis Date  . Thyroid disease   . Depression   . Hypercholesteremia   . Hypertension   . Cataract     Bil    MEDICATIONS:  Prior to Admission medications   Medication Sig Start Date End Date Taking? Authorizing Provider  ALPRAZolam (XANAX) 0.25 MG tablet Take 0.25 mg by mouth 3 (three) times daily as needed. For anxiety    Historical Provider, MD  amoxicillin (AMOXIL) 500 MG tablet Take 1 tablet (500 mg total) by mouth 2 (two) times daily. 05/02/13   Thao P Le, DO  calcium carbonate (OS-CAL - DOSED IN MG OF ELEMENTAL CALCIUM) 1250 MG tablet Take 1 tablet by mouth 2 (two) times daily.     Historical Provider, MD  fish oil-omega-3 fatty acids 1000 MG  capsule Take 600 mg by mouth 2 (two) times daily.     Historical Provider, MD  gabapentin (NEURONTIN) 300 MG capsule Take 300 mg by mouth at bedtime.    Historical Provider, MD  hydrochlorothiazide (MICROZIDE) 12.5 MG capsule Take 12.5 mg by mouth daily.    Historical Provider, MD  HYDROcodone-acetaminophen (NORCO) 7.5-325 MG per tablet Take 1 tablet by mouth every 6 (six) hours as needed.    Historical Provider, MD  levothyroxine (SYNTHROID, LEVOTHROID) 137 MCG tablet Take 137 mcg by mouth daily.    Historical Provider, MD  Multiple Vitamin (MULTIVITAMIN) tablet Take 1 tablet by mouth daily.    Historical Provider, MD  omeprazole (PRILOSEC) 40 MG capsule Take 40 mg by mouth daily.    Historical Provider, MD  predniSONE (DELTASONE) 20 MG tablet Three tablets daily x 1 day then two tablets daily x 5 days then one tablet daily x 5 days 05/11/13   Ethelda Chick, MD  simvastatin (ZOCOR) 40 MG tablet Take 40 mg by mouth every evening.    Historical Provider, MD  traZODone (DESYREL) 150 MG tablet Take 150 mg by mouth at bedtime.    Historical Provider, MD  venlafaxine XR (EFFEXOR-XR) 75 MG 24 hr capsule Take 75 mg by mouth daily.    Historical Provider, MD  VOLTAREN 1 % GEL  07/26/12   Historical Provider, MD  zolpidem (AMBIEN) 10 MG tablet Take 10 mg by mouth at bedtime.    Historical  Provider, MD    ALLERGIES:  Allergies  Allergen Reactions  . Influenza Vaccines Hives and Rash  . Amoxicillin Rash    SOCIAL HISTORY:  History  Substance Use Topics  . Smoking status: Former Smoker    Types: Cigarettes    Quit date: 11/04/2009  . Smokeless tobacco: Never Used  . Alcohol Use: No    FAMILY HISTORY: Family History  Problem Relation Age of Onset  . Heart disease Mother     EXAM: BP 145/70  Pulse 72  Temp(Src) 98.6 F (37 C) (Oral)  Resp 16  SpO2 96% CONSTITUTIONAL: Alert and oriented and responds appropriately to questions. Well-appearing; well-nourished, patient is tearful and  anxious HEAD: Normocephalic EYES: Conjunctivae clear, PERRL ENT: normal nose; no rhinorrhea; moist mucous membranes; pharynx without lesions noted, no lesions noted within the mouth or oropharynx NECK: Supple, no meningismus, no LAD  CARD: RRR; S1 and S2 appreciated; no murmurs, no clicks, no rubs, no gallops RESP: Normal chest excursion without splinting or tachypnea; breath sounds clear and equal bilaterally; no wheezes, no rhonchi, no rales,  ABD/GI: Normal bowel sounds; non-distended; soft, non-tender, no rebound, no guarding BACK:  The back appears normal and is non-tender to palpation, there is no CVA tenderness EXT: Normal ROM in all joints; non-tender to palpation; no edema; normal capillary refill; no cyanosis    SKIN: Patient does have multiple erythematous papules on her torso and extremities, no rash on her palms or soles NEURO: Moves all extremities equally PSYCH: The patient's mood and manner are appropriate. Grooming and personal hygiene are appropriate.  MEDICAL DECISION MAKING: Patient with a nonspecific rash which may be allergic reaction versus  Drug reaction. Low suspicion for vasculitis or infectious etiology.  Will obtain basic labs to eval for possible tick borne however unlikely and pt denies exposure and no fever.  Will give benadryl and reassess.  Pt has taken steroids today.  We'll also obtain chest x-ray given her mild shortness of breath. Reassured patient that her O2 sats, lungs exam were reassuring.  ED PROGRESS:  12:56 PM Pt rash on her extremities has improved. She still has some rash on her face but her lungs are clear, no angioedema. Patient reports that she is on a prednisone taper. I have instructed her to continue this as well as when necessary Benadryl. Her labs are unremarkable. Given the patient reassurance and customary usual return precautions. We'll have her followup with her primary care physician if her rash is not improved after her prednisone has been  completed. Instructed her to avoid penicillins in the future she may be having an adverse versus allergic reaction to this medication. Patient verbalizes understanding and is comfortable with this plan.  Layla Maw Hesston Hitchens, DO 05/13/13 1257

## 2013-05-13 NOTE — ED Notes (Signed)
Pt reports she was put on amoxicillin for sinusitis, had allergic reaction with small hives, pt went to pcp on Friday. stopped the amoxicillin and put on predisone and benedryl, rash visible on neck, reports slight SOB. Denies pain

## 2013-07-09 ENCOUNTER — Emergency Department (HOSPITAL_COMMUNITY): Payer: Medicare Other

## 2013-07-09 ENCOUNTER — Encounter (HOSPITAL_COMMUNITY): Payer: Self-pay | Admitting: Emergency Medicine

## 2013-07-09 ENCOUNTER — Emergency Department (HOSPITAL_COMMUNITY)
Admission: EM | Admit: 2013-07-09 | Discharge: 2013-07-09 | Disposition: A | Discharge status: Home / Self Care | Payer: Medicare Other | Attending: Emergency Medicine | Admitting: Emergency Medicine

## 2013-07-09 DIAGNOSIS — E7800 Pure hypercholesterolemia, unspecified: Secondary | ICD-10-CM | POA: Insufficient documentation

## 2013-07-09 DIAGNOSIS — E785 Hyperlipidemia, unspecified: Secondary | ICD-10-CM | POA: Insufficient documentation

## 2013-07-09 DIAGNOSIS — Z87891 Personal history of nicotine dependence: Secondary | ICD-10-CM | POA: Insufficient documentation

## 2013-07-09 DIAGNOSIS — Z79899 Other long term (current) drug therapy: Secondary | ICD-10-CM | POA: Insufficient documentation

## 2013-07-09 DIAGNOSIS — I10 Essential (primary) hypertension: Secondary | ICD-10-CM | POA: Insufficient documentation

## 2013-07-09 DIAGNOSIS — E079 Disorder of thyroid, unspecified: Secondary | ICD-10-CM | POA: Insufficient documentation

## 2013-07-09 DIAGNOSIS — Z880 Allergy status to penicillin: Secondary | ICD-10-CM | POA: Insufficient documentation

## 2013-07-09 DIAGNOSIS — F329 Major depressive disorder, single episode, unspecified: Secondary | ICD-10-CM | POA: Insufficient documentation

## 2013-07-09 DIAGNOSIS — M79609 Pain in unspecified limb: Secondary | ICD-10-CM | POA: Insufficient documentation

## 2013-07-09 DIAGNOSIS — H269 Unspecified cataract: Secondary | ICD-10-CM | POA: Insufficient documentation

## 2013-07-09 DIAGNOSIS — F3289 Other specified depressive episodes: Secondary | ICD-10-CM | POA: Insufficient documentation

## 2013-07-09 DIAGNOSIS — R0602 Shortness of breath: Secondary | ICD-10-CM | POA: Insufficient documentation

## 2013-07-09 DIAGNOSIS — R079 Chest pain, unspecified: Secondary | ICD-10-CM | POA: Insufficient documentation

## 2013-07-09 LAB — URINALYSIS, ROUTINE W REFLEX MICROSCOPIC
Glucose, UA: NEGATIVE mg/dL
Ketones, ur: NEGATIVE mg/dL
Leukocytes, UA: NEGATIVE
Nitrite: NEGATIVE
Specific Gravity, Urine: 1.015 (ref 1.005–1.030)
pH: 5.5 (ref 5.0–8.0)

## 2013-07-09 LAB — POCT I-STAT TROPONIN I
Troponin i, poc: 0 ng/mL (ref 0.00–0.08)
Troponin i, poc: 0 ng/mL (ref 0.00–0.08)

## 2013-07-09 LAB — COMPREHENSIVE METABOLIC PANEL
ALT: 17 U/L (ref 0–35)
Albumin: 4 g/dL (ref 3.5–5.2)
Alkaline Phosphatase: 52 U/L (ref 39–117)
BUN: 18 mg/dL (ref 6–23)
Calcium: 9.1 mg/dL (ref 8.4–10.5)
Potassium: 3.7 mEq/L (ref 3.5–5.1)
Sodium: 138 mEq/L (ref 135–145)
Total Protein: 7 g/dL (ref 6.0–8.3)

## 2013-07-09 LAB — URINE MICROSCOPIC-ADD ON

## 2013-07-09 LAB — CBC
MCH: 31.4 pg (ref 26.0–34.0)
MCHC: 34.1 g/dL (ref 30.0–36.0)
RDW: 12.1 % (ref 11.5–15.5)

## 2013-07-09 LAB — PRO B NATRIURETIC PEPTIDE: Pro B Natriuretic peptide (BNP): 226 pg/mL — ABNORMAL HIGH (ref 0–125)

## 2013-07-09 MED ORDER — MORPHINE SULFATE 4 MG/ML IJ SOLN
4.0000 mg | Freq: Once | INTRAMUSCULAR | Status: AC
  Administered 2013-07-09: 4 mg via INTRAVENOUS
  Filled 2013-07-09: qty 1

## 2013-07-09 MED ORDER — KETOROLAC TROMETHAMINE 30 MG/ML IJ SOLN
30.0000 mg | Freq: Once | INTRAMUSCULAR | Status: AC
  Administered 2013-07-09: 30 mg via INTRAVENOUS
  Filled 2013-07-09: qty 1

## 2013-07-09 MED ORDER — ASPIRIN 325 MG PO TABS
325.0000 mg | ORAL_TABLET | Freq: Once | ORAL | Status: AC
  Administered 2013-07-09: 325 mg via ORAL
  Filled 2013-07-09: qty 1

## 2013-07-09 MED ORDER — IOHEXOL 350 MG/ML SOLN
100.0000 mL | Freq: Once | INTRAVENOUS | Status: AC | PRN
  Administered 2013-07-09: 100 mL via INTRAVENOUS

## 2013-07-09 MED ORDER — DIAZEPAM 5 MG/ML IJ SOLN
5.0000 mg | Freq: Once | INTRAMUSCULAR | Status: AC
  Administered 2013-07-09: 5 mg via INTRAVENOUS
  Filled 2013-07-09: qty 2

## 2013-07-09 MED ORDER — HYDROMORPHONE HCL PF 1 MG/ML IJ SOLN
1.0000 mg | Freq: Once | INTRAMUSCULAR | Status: AC
  Administered 2013-07-09: 1 mg via INTRAVENOUS
  Filled 2013-07-09: qty 1

## 2013-07-09 NOTE — ED Notes (Signed)
MD at bedside. 

## 2013-07-09 NOTE — ED Notes (Signed)
Pt complains of L shoulder pain and chest pain for past several days. Got worse today at appoximately 4am.  Denies injury. Pt complains of sob and dizziness/weakness.

## 2013-07-09 NOTE — ED Provider Notes (Addendum)
CSN: [id number]     Arrival date & time 07/09/13  0741 History   First MD Initiated Contact with Patient 07/09/13 [id number]     Chief Complaint  Patient presents with  . Chest Pain  . Arm Pain   (Consider location/radiation/quality/duration/timing/severity/associated sxs/prior Treatment) The history is provided by the patient.  LASHONDA SONNEBORN is a 67 y.o. female history of hypothyroidism, hypertension, hyperlipidemia here presenting with chest pain. Intermittent left-sided chest pain for the last month. It is a crampy feeling and worse with movement and taking a deep breath. This morning around 4 AM she had worsening pain that woke her up from sleep and some shortness of breath. Denies any recent travel history blood clots. Denies leg swelling. Denies any history of CAD or cardiac stents.    Past Medical History  Diagnosis Date  . Thyroid disease   . Depression   . Hypercholesteremia   . Hypertension   . Cataract     Bil   Past Surgical History  Procedure Laterality Date  . Joint replacement  2010    left knee  . Foot surgery  2011    right foot  . Cholecystectomy    . Colonoscopy    . Polypectomy    . Tonsillectomy     Family History  Problem Relation Age of Onset  . Heart disease Mother    History  Substance Use Topics  . Smoking status: Former Smoker    Types: Cigarettes    Quit date: 11/04/2009  . Smokeless tobacco: Never Used  . Alcohol Use: No   OB History   Grav Para Term Preterm Abortions TAB SAB Ect Mult Living                 Review of Systems  Respiratory: Positive for shortness of breath.   Cardiovascular: Positive for chest pain.  All other systems reviewed and are negative.    Allergies  Penicillins and Amoxicillin  Home Medications   Current Outpatient Rx  Name  Route  Sig  Dispense  Refill  . ALPRAZolam (XANAX) 0.25 MG tablet   Oral   Take 0.25 mg by mouth 3 (three) times daily as needed. For anxiety         . calcium carbonate (OS-CAL  - DOSED IN MG OF ELEMENTAL CALCIUM) 1250 MG tablet   Oral   Take 1 tablet by mouth 2 (two) times daily.          . celecoxib (CELEBREX) 200 MG capsule   Oral   Take 200 mg by mouth daily.         . diphenhydrAMINE (BENADRYL) 25 MG tablet   Oral   Take 25 mg by mouth every 6 (six) hours as needed for itching.         . ferrous sulfate 325 (65 FE) MG tablet   Oral   Take 325 mg by mouth daily with breakfast.         . fish oil-omega-3 fatty acids 1000 MG capsule   Oral   Take 600 mg by mouth 2 (two) times daily.          [name] gabapentin (NEURONTIN) 300 MG capsule   Oral   Take 300 mg by mouth at bedtime.         . hydrochlorothiazide (MICROZIDE) 12.5 MG capsule   Oral   Take 12.5 mg by mouth daily.         [name] HYDROcodone-acetaminophen (NORCO) 7.5-325 MG per tablet  Oral   Take 1 tablet by mouth every 6 (six) hours as needed.         [name] levothyroxine (SYNTHROID, LEVOTHROID) 137 MCG tablet   Oral   Take 137 mcg by mouth daily.         . Multiple Vitamin (MULTIVITAMIN) tablet   Oral   Take 1 tablet by mouth daily.         [name] omeprazole (PRILOSEC) 40 MG capsule   Oral   Take 40 mg by mouth daily.         . simvastatin (ZOCOR) 40 MG tablet   Oral   Take 40 mg by mouth every evening.         . traZODone (DESYREL) 150 MG tablet   Oral   Take 150 mg by mouth at bedtime.         [name] venlafaxine XR (EFFEXOR-XR) 75 MG 24 hr capsule   Oral   Take 75 mg by mouth daily.         [name] zolpidem (AMBIEN) 10 MG tablet   Oral   Take 10 mg by mouth at bedtime.          BP 189/80  Pulse 88  Temp(Src) 98.8 F (37.1 C) (Oral)  Resp 20  SpO2 98% Physical Exam  Nursing note and vitals reviewed. Constitutional: She is oriented to person, place, and time.  Tearful, uncomfortable   HENT:  Head: Normocephalic.  Mouth/Throat: Oropharynx is clear and moist.  Eyes: Conjunctivae are normal. Pupils are equal, round, and reactive to light.  Neck: Normal range  of motion. Neck supple.  Cardiovascular: Normal rate, regular rhythm and normal heart sounds.   Pulmonary/Chest: Effort normal and breath sounds normal. No respiratory distress. She has no wheezes. She has no rales.  L sided chest tenderness reproducible on palpation   Abdominal: Soft. Bowel sounds are normal. She exhibits no distension. There is no tenderness. There is no rebound and no guarding.  Musculoskeletal: Normal range of motion. She exhibits no edema and no tenderness.  Neurological: She is alert and oriented to person, place, and time.  Skin: Skin is warm and dry.  Psychiatric: She has a normal mood and affect. Her behavior is normal. Judgment and thought content normal.    ED Course  Procedures (including critical care time) Labs Review Labs Reviewed  CBC - Abnormal; Notable for the following:    RBC 5.13 (*)    Hemoglobin 16.1 (*)    HCT 47.2 (*)    All other components within normal limits  COMPREHENSIVE METABOLIC PANEL - Abnormal; Notable for the following:    Glucose, Bld 112 (*)    GFR calc non Af Amer 84 (*)    All other components within normal limits  URINALYSIS, ROUTINE W REFLEX MICROSCOPIC - Abnormal; Notable for the following:    Hgb urine dipstick TRACE (*)    All other components within normal limits  D-DIMER, QUANTITATIVE - Abnormal; Notable for the following:    D-Dimer, Quant 0.52 (*)    All other components within normal limits  PRO B NATRIURETIC PEPTIDE - Abnormal; Notable for the following:    Pro B Natriuretic peptide (BNP) 226.0 (*)    All other components within normal limits  URINE MICROSCOPIC-ADD ON  POCT I-STAT TROPONIN I  POCT I-STAT TROPONIN I   Imaging Review Ct Angio Chest Pe W/cm &/or Wo Cm  07/09/2013   CLINICAL DATA:  Chest pain. Shortness of breath.  EXAM: CT ANGIOGRAPHY  CHEST WITH CONTRAST  TECHNIQUE: Multidetector CT imaging of the chest was performed using the standard protocol during bolus administration of intravenous contrast.  Multiplanar CT image reconstructions including MIPs were obtained to evaluate the vascular anatomy.  CONTRAST:  OMNIPAQUE IOHEXOL 350 MG/ML SOLN  COMPARISON:  Chest CTA 03/2012.  FINDINGS: Adequate opacification of the main pulmonary artery for the detection of pulmonary embolus. No pulmonary embolus identified.  No enlarged axillary, mediastinal or hilar lymphadenopathy. Normal heart size. No pericardial effusion. Aorta and main pulmonary artery are normal in caliber.  Central airways are patent. Minimal dependent atelectasis within the bilateral lower lobes. No consolidative or nodular pulmonary opacities  Incidental imaging of the upper abdomen demonstrates no focal abnormalities. Mid thoracic spine degenerative change.  Review of the MIP images confirms the above findings.  IMPRESSION: 1. No evidence for pulmonary embolism.   Electronically Signed   By: Annia Belt M.D.   On: 07/09/2013 09:53   Dg Chest Port 1 View  07/09/2013   CLINICAL DATA:  Shortness of breath. Cough.  EXAM: PORTABLE CHEST - 1 VIEW  COMPARISON:  Chest radiograph 05/13/2013  FINDINGS: Mediastinal contours. Pulmonary venous hypertension no definite overt pulmonary edema. No definite overt pulmonary edema. No consolidative pulmonary opacities or pleural effusions. Regional skeleton is unremarkable.  IMPRESSION: Mild pulmonary venous hypertension.   Electronically Signed   By: Annia Belt M.D.   On: 07/09/2013 08:13     Date: 07/09/2013  Rate: 79  Rhythm: normal sinus rhythm  QRS Axis: normal  Intervals: normal  ST/T Wave abnormalities: normal  Conduction Disutrbances:none  Narrative Interpretation:   Old EKG Reviewed: unchanged    MDM  No diagnosis found. JOCILYN TREGO is a 67 y.o. female here with chest pain. Pain for a month, reproducible. Likely MSK. But given pleuritic component, will do d-dimer. Given SOB will get BNP to r/o new onset CHF. Unlikely ACS but given age will get trop x 2.   11:13 AM Trop neg x 2.  D-dimer slightly elevated. CT chest nl. BNP slightly elevated but no signs of heart failure. She now say that she has been stressed out. Denies suicidal or homicidal ideations. Pain improved and has vicodin at home. I told her to f/u with pmd to get stress test.    Richardean Canal, MD 07/09/13 1114  Richardean Canal, MD 07/09/13 1115

## 2013-10-26 ENCOUNTER — Ambulatory Visit (INDEPENDENT_AMBULATORY_CARE_PROVIDER_SITE_OTHER): Payer: Medicare Other

## 2013-10-26 VITALS — BP 135/83 | HR 76 | Resp 16

## 2013-10-26 DIAGNOSIS — D3610 Benign neoplasm of peripheral nerves and autonomic nervous system, unspecified: Secondary | ICD-10-CM

## 2013-10-26 DIAGNOSIS — M779 Enthesopathy, unspecified: Secondary | ICD-10-CM

## 2013-10-26 DIAGNOSIS — D219 Benign neoplasm of connective and other soft tissue, unspecified: Secondary | ICD-10-CM

## 2013-10-26 DIAGNOSIS — M79609 Pain in unspecified limb: Secondary | ICD-10-CM

## 2013-10-26 MED ORDER — TRIAMCINOLONE ACETONIDE 10 MG/ML IJ SUSP: 10.0000 mg | Freq: Once | INTRAMUSCULAR | Status: DC

## 2013-10-26 NOTE — Patient Instructions (Signed)

## 2013-10-26 NOTE — Progress Notes (Signed)
   Subjective:    Patient ID: Dawn Kim, female    DOB: 07/09/1946, 68 y.o.   MRN: [id number]  HPI Comments: "I am still having pain in the tops of my feet. The shots is what helped the best for me."  Foot Pain      Review of Systems no new findings or changes any significant systems     Objective:   Physical Exam Vascular status is intact as follows DP postal for PT plus one over 4 bilateral Refill time 3 seconds all digits skin temperature warm turgor diminished no edema rubor pallor or varicosities noted. Neurologically epicritic and proprioceptive sensations intact although there are paresthesias bilateral. Patient's and some history of neuroma surgery on the right second interspace however continues have pain in symptomology and capsulitis second MTP area second interspace bilateral. There is pain on direct lateral compression second interspace left more so than right it with enclosed shoes activities and ambulation. Orthopedic biomechanical exam otherwise unremarkable no chest significant changes hair growth diminished absent nails unremarkable no open wounds or ulcerations noted current time. Previous steroid injections were beneficial for several months.       Assessment & Plan:  Assessment this time capsulitis second MTP area bilateral with possibly associated neuroma symptomology second interspace plan at this time injection tender with Kenalog 20 mg Xylocaine plain infiltrated to the second intermetatarsal space 1 second digit common digital nerve and second MTP joint capsule. Maintain ice, Advil or aspirin as needed for pain recheck in 2 or 3 months for followup continued palliative care in the future as needed patient wearing orthoses and appropriate coming shoes as instructed again recheck in likely 2 months  Harriet Masson DPM

## 2013-10-28 ENCOUNTER — Other Ambulatory Visit: Payer: Self-pay | Admitting: Podiatry

## 2013-10-29 ENCOUNTER — Telehealth: Payer: Self-pay | Admitting: *Deleted

## 2013-10-30 NOTE — Telephone Encounter (Signed)
Ok meloxicam 15 mg daily with 2 refills  Dr Blenda Mounts

## 2013-11-14 ENCOUNTER — Ambulatory Visit (INDEPENDENT_AMBULATORY_CARE_PROVIDER_SITE_OTHER): Payer: Medicare Other

## 2013-11-14 VITALS — BP 140/72 | HR 73 | Resp 18

## 2013-11-14 DIAGNOSIS — M79609 Pain in unspecified limb: Secondary | ICD-10-CM

## 2013-11-14 DIAGNOSIS — D219 Benign neoplasm of connective and other soft tissue, unspecified: Secondary | ICD-10-CM

## 2013-11-14 DIAGNOSIS — D3610 Benign neoplasm of peripheral nerves and autonomic nervous system, unspecified: Secondary | ICD-10-CM

## 2013-11-14 DIAGNOSIS — M779 Enthesopathy, unspecified: Secondary | ICD-10-CM

## 2013-11-14 NOTE — Progress Notes (Signed)
   Subjective:    Patient ID: Dawn Kim, female    DOB: Jul 27, 1946, 68 y.o.   MRN: [id number]  HPI both my feet hurt and I am suppose to have some shots done today    Review of Systems no new changes or findings patient continues to have neuritis symptomology bilateral forefoot cannot rule out recurrent neuroma versus chronic pain in symptomology and neuralgia     Objective:   Physical Exam Lotion objective findings as follows pedal pulses are palpable plus one over 4 bilateral DP and PT. Refill time 3 seconds all digits skin color pigment normal. Orthopedic biomechanical exam unremarkable active triggering patient had previous neuroma surgery on the right second and third interspaces are continues to have pain on direct compression of the MTP capsule and interspace second and third bilateral. Both equally painful and symptomatic despite steroid injections which provided temporary relief at best. At this time we previous discuss the possible series of alcohol injections from 57 injections which can be delivered patient is advised this is not a guarantee it may or may not help and she may continue chronic nerve type pain in symptomology. Patient is also taking gabapentin which has provided some improvement relief although not complete resolution.       Assessment & Plan:  Assessment Morton's neuroma and neuralgia bilateral forefoot second and third interspaces. Plan at this time injection of 4% alcohol solution and 0.5% Marcaine plain and 1 cc is infiltrated each of the second and third interspaces of both feet. Patient tolerated these injections well with minimal discomfort reappointed next week or 2 for her second in a series of 5-7 injections we'll schedule appointments in advance for weekly her two-week intervals as recommended. Patient denies to contact us in changes or exacerbations in the interim we use Tylenol as needed for pain also ice to the foot after her injection  series.  Harriet Masson DPM

## 2013-11-14 NOTE — Patient Instructions (Signed)

## 2013-11-15 ENCOUNTER — Telehealth: Payer: Self-pay | Admitting: *Deleted

## 2013-11-15 NOTE — Telephone Encounter (Signed)
There are no other good options in [place] for shoes other than shoe market. At least not one that am aware of. She can tried a [facility] in [place].

## 2013-11-15 NOTE — Telephone Encounter (Signed)
Pt asked for another shoe store he preferred, she did not care for [employer].

## 2013-11-21 NOTE — Telephone Encounter (Signed)
Called pt-informed her of Dr. Erasmo Downer response regarding shoe store options. She states that she did not want to have to drive all the [place].

## 2013-11-27 ENCOUNTER — Ambulatory Visit (INDEPENDENT_AMBULATORY_CARE_PROVIDER_SITE_OTHER): Payer: Medicare Other | Admitting: Emergency Medicine

## 2013-11-27 VITALS — BP 130/78 | HR 74 | Temp 98.1°F | Resp 18 | Ht 62.5 in | Wt 190.0 lb

## 2013-11-27 DIAGNOSIS — J069 Acute upper respiratory infection, unspecified: Secondary | ICD-10-CM

## 2013-11-27 MED ORDER — AZITHROMYCIN 250 MG PO TABS: ORAL_TABLET | ORAL | Status: DC

## 2013-11-27 NOTE — Progress Notes (Signed)
   Subjective:    Patient ID: Dawn Kim, female    DOB: 1946-08-31, 68 y.o.   MRN: [id number]  HPI One week of sore throat, swollen glands and generalized fatigue and malaise.  No fever.  Intermittent cough which is nonproductive.  No n/v.  Has not taken anything for her symptoms.  No chest pain or shortness of breath.  She reports mild pain with swallowing.  No sick contacts.  PPMH:  Hypertension  SH:  Former smoker, no alcohol.  Review of Systems  Constitutional: Negative for fever and chills.  HENT: Positive for ear pain, rhinorrhea and sore throat. Negative for sinus pressure and trouble swallowing.   Respiratory: Positive for cough. Negative for chest tightness and shortness of breath.   Gastrointestinal: Negative for nausea, vomiting, abdominal pain, diarrhea and constipation.  Musculoskeletal: Positive for myalgias. Negative for back pain and neck pain.  Skin: Negative for rash.       Objective:   Physical Exam Blood pressure 130/78, pulse 74, temperature 98.1 F (36.7 C), temperature source Oral, resp. rate 18, height 5' 2.5" (1.588 m), weight 190 lb (86.183 kg), SpO2 97.00%. Body mass index is 34.18 kg/(m^2). Well-developed, well nourished female who is awake, alert and oriented, in NAD. HEENT: /AT, PERRL, EOMI.  Sclera and conjunctiva are clear.  EAC are patent, TMs are buldging with no erythema. Nasal mucosa is pink and moist. OP is erythematous with no exudates. Neck: supple, non-tender, no lymphadenopathy, thyromegaly. Heart: RRR, no murmur Lungs: normal effort, CTA Abdomen: normo-active bowel sounds, supple, non-tender, no mass or organomegaly. Extremities: no cyanosis, clubbing or edema. Skin: warm and dry without rash. Psychologic: good mood and appropriate affect, normal speech and behavior.      Assessment & Plan:  Upper respiratory infection.  Start zithromax and prn tylenol.

## 2013-11-27 NOTE — Patient Instructions (Signed)

## 2013-11-28 ENCOUNTER — Ambulatory Visit: Payer: Medicare Other

## 2013-12-05 ENCOUNTER — Ambulatory Visit: Payer: Medicare Other

## 2013-12-12 ENCOUNTER — Ambulatory Visit: Payer: Medicare Other

## 2013-12-19 ENCOUNTER — Ambulatory Visit: Payer: Medicare Other

## 2014-01-18 ENCOUNTER — Other Ambulatory Visit (HOSPITAL_COMMUNITY): Payer: Self-pay | Admitting: Specialist

## 2014-01-18 DIAGNOSIS — M25569 Pain in unspecified knee: Secondary | ICD-10-CM

## 2014-01-30 ENCOUNTER — Ambulatory Visit (HOSPITAL_COMMUNITY): Payer: Medicare Other

## 2014-01-30 ENCOUNTER — Encounter (HOSPITAL_COMMUNITY): Payer: Medicare Other

## 2014-02-26 ENCOUNTER — Encounter: Payer: Self-pay | Admitting: Internal Medicine

## 2014-03-27 ENCOUNTER — Encounter (HOSPITAL_COMMUNITY): Payer: Self-pay | Admitting: Emergency Medicine

## 2014-03-27 ENCOUNTER — Emergency Department (HOSPITAL_COMMUNITY): Payer: Medicare Other

## 2014-03-27 ENCOUNTER — Emergency Department (HOSPITAL_COMMUNITY)
Admission: EM | Admit: 2014-03-27 | Discharge: 2014-03-27 | Disposition: A | Discharge status: Home / Self Care | Payer: Medicare Other | Attending: Emergency Medicine | Admitting: Emergency Medicine

## 2014-03-27 DIAGNOSIS — Z87891 Personal history of nicotine dependence: Secondary | ICD-10-CM | POA: Insufficient documentation

## 2014-03-27 DIAGNOSIS — Z791 Long term (current) use of non-steroidal anti-inflammatories (NSAID): Secondary | ICD-10-CM | POA: Insufficient documentation

## 2014-03-27 DIAGNOSIS — R5383 Other fatigue: Secondary | ICD-10-CM

## 2014-03-27 DIAGNOSIS — R0602 Shortness of breath: Secondary | ICD-10-CM | POA: Insufficient documentation

## 2014-03-27 DIAGNOSIS — H269 Unspecified cataract: Secondary | ICD-10-CM | POA: Insufficient documentation

## 2014-03-27 DIAGNOSIS — F3289 Other specified depressive episodes: Secondary | ICD-10-CM | POA: Insufficient documentation

## 2014-03-27 DIAGNOSIS — IMO0001 Reserved for inherently not codable concepts without codable children: Secondary | ICD-10-CM | POA: Insufficient documentation

## 2014-03-27 DIAGNOSIS — R490 Dysphonia: Secondary | ICD-10-CM | POA: Insufficient documentation

## 2014-03-27 DIAGNOSIS — R5381 Other malaise: Secondary | ICD-10-CM | POA: Insufficient documentation

## 2014-03-27 DIAGNOSIS — F329 Major depressive disorder, single episode, unspecified: Secondary | ICD-10-CM | POA: Insufficient documentation

## 2014-03-27 DIAGNOSIS — Z880 Allergy status to penicillin: Secondary | ICD-10-CM | POA: Insufficient documentation

## 2014-03-27 DIAGNOSIS — Z792 Long term (current) use of antibiotics: Secondary | ICD-10-CM | POA: Insufficient documentation

## 2014-03-27 DIAGNOSIS — E7800 Pure hypercholesterolemia, unspecified: Secondary | ICD-10-CM | POA: Insufficient documentation

## 2014-03-27 DIAGNOSIS — E079 Disorder of thyroid, unspecified: Secondary | ICD-10-CM | POA: Insufficient documentation

## 2014-03-27 DIAGNOSIS — R0789 Other chest pain: Secondary | ICD-10-CM | POA: Insufficient documentation

## 2014-03-27 DIAGNOSIS — Z79899 Other long term (current) drug therapy: Secondary | ICD-10-CM | POA: Insufficient documentation

## 2014-03-27 DIAGNOSIS — I10 Essential (primary) hypertension: Secondary | ICD-10-CM | POA: Insufficient documentation

## 2014-03-27 DIAGNOSIS — L509 Urticaria, unspecified: Secondary | ICD-10-CM | POA: Insufficient documentation

## 2014-03-27 DIAGNOSIS — R630 Anorexia: Secondary | ICD-10-CM | POA: Insufficient documentation

## 2014-03-27 LAB — CBC
HCT: 44.1 % (ref 36.0–46.0)
Hemoglobin: 14.9 g/dL (ref 12.0–15.0)
MCH: 30.8 pg (ref 26.0–34.0)
MCHC: 33.8 g/dL (ref 30.0–36.0)
MCV: 91.3 fL (ref 78.0–100.0)
PLATELETS: 237 10*3/uL (ref 150–400)
RBC: 4.83 MIL/uL (ref 3.87–5.11)
RDW: 12.1 % (ref 11.5–15.5)
WBC: 7.5 10*3/uL (ref 4.0–10.5)

## 2014-03-27 LAB — BASIC METABOLIC PANEL
BUN: 14 mg/dL (ref 6–23)
CALCIUM: 9.3 mg/dL (ref 8.4–10.5)
CO2: 30 meq/L (ref 19–32)
Chloride: 99 mEq/L (ref 96–112)
Creatinine, Ser: 0.82 mg/dL (ref 0.50–1.10)
GFR, EST AFRICAN AMERICAN: 83 mL/min — AB (ref >90)
GFR, EST NON AFRICAN AMERICAN: 71 mL/min — AB (ref >90)
Glucose, Bld: 94 mg/dL (ref 70–99)
Potassium: 3.7 mEq/L (ref 3.7–5.3)
SODIUM: 141 meq/L (ref 137–147)

## 2014-03-27 LAB — HEPATIC FUNCTION PANEL
ALT: 16 U/L (ref 0–35)
AST: 19 U/L (ref 0–37)
Albumin: 4 g/dL (ref 3.5–5.2)
Alkaline Phosphatase: 57 U/L (ref 39–117)
BILIRUBIN TOTAL: 0.6 mg/dL (ref 0.3–1.2)
Bilirubin, Direct: <0.2 mg/dL (ref 0.0–0.3)
Total Protein: 6.7 g/dL (ref 6.0–8.3)

## 2014-03-27 LAB — PRO B NATRIURETIC PEPTIDE: PRO B NATRI PEPTIDE: 59.8 pg/mL (ref 0–125)

## 2014-03-27 LAB — I-STAT TROPONIN, ED: TROPONIN I, POC: 0 ng/mL (ref 0.00–0.08)

## 2014-03-27 LAB — URINALYSIS, ROUTINE W REFLEX MICROSCOPIC
Bilirubin Urine: NEGATIVE
Glucose, UA: NEGATIVE mg/dL
HGB URINE DIPSTICK: NEGATIVE
KETONES UR: NEGATIVE mg/dL
Leukocytes, UA: NEGATIVE
NITRITE: NEGATIVE
PROTEIN: NEGATIVE mg/dL
Specific Gravity, Urine: 1.005 (ref 1.005–1.030)
UROBILINOGEN UA: 0.2 mg/dL (ref 0.0–1.0)
pH: 6 (ref 5.0–8.0)

## 2014-03-27 LAB — LIPASE, BLOOD: LIPASE: 32 U/L (ref 11–59)

## 2014-03-27 LAB — T4, FREE: Free T4: 1.48 ng/dL (ref 0.80–1.80)

## 2014-03-27 LAB — TROPONIN I: Troponin I: <0.3 ng/mL (ref <0.30)

## 2014-03-27 LAB — MAGNESIUM: MAGNESIUM: 2.1 mg/dL (ref 1.5–2.5)

## 2014-03-27 LAB — TSH: TSH: 1.17 u[IU]/mL (ref 0.350–4.500)

## 2014-03-27 LAB — CK: Total CK: 117 U/L (ref 7–177)

## 2014-03-27 LAB — D-DIMER, QUANTITATIVE (NOT AT ARMC): D DIMER QUANT: 0.3 ug{FEU}/mL (ref 0.00–0.48)

## 2014-03-27 MED ORDER — FAMOTIDINE IN NACL 20-0.9 MG/50ML-% IV SOLN
20.0000 mg | Freq: Once | INTRAVENOUS | Status: AC
  Administered 2014-03-27: 20 mg via INTRAVENOUS
  Filled 2014-03-27: qty 50

## 2014-03-27 MED ORDER — DIPHENHYDRAMINE HCL 50 MG/ML IJ SOLN
25.0000 mg | Freq: Once | INTRAMUSCULAR | Status: AC
  Administered 2014-03-27: 25 mg via INTRAVENOUS
  Filled 2014-03-27: qty 1

## 2014-03-27 NOTE — ED Notes (Signed)
Patient reports she feels knots on both sides of her neck and also is having pain behind both ears.

## 2014-03-27 NOTE — Discharge Instructions (Signed)
Fatigue Your testing today is reassuring. Followup with Dr. [place] Singer tomorrow. Return to the ED if you develop new or worsening symptoms.  Fatigue is a feeling of tiredness, lack of energy, lack of motivation, or feeling tired all the time. Having enough rest, good nutrition, and reducing stress will normally reduce fatigue. Consult your caregiver if it persists. The nature of your fatigue will help your caregiver to find out its cause. The treatment is based on the cause.  CAUSES  There are many causes for fatigue. Most of the time, fatigue can be traced to one or more of your habits or routines. Most causes fit into one or more of three general areas. They are: Lifestyle problems  Sleep disturbances.  Overwork.  Physical exertion.  Unhealthy habits.  Poor eating habits or eating disorders.  Alcohol and/or drug use .  Lack of proper nutrition (malnutrition). Psychological problems  Stress and/or anxiety problems.  Depression.  Grief.  Boredom. Medical Problems or Conditions  Anemia.  Pregnancy.  Thyroid gland problems.  Recovery from major surgery.  Continuous pain.  Emphysema or asthma that is not well controlled  Allergic conditions.  Diabetes.  Infections (such as mononucleosis).  Obesity.  Sleep disorders, such as sleep apnea.  Heart failure or other heart-related problems.  Cancer.  Kidney disease.  Liver disease.  Effects of certain medicines such as antihistamines, cough and cold remedies, prescription pain medicines, heart and blood pressure medicines, drugs used for treatment of cancer, and some antidepressants. SYMPTOMS  The symptoms of fatigue include:   Lack of energy.  Lack of drive (motivation).  Drowsiness.  Feeling of indifference to the surroundings. DIAGNOSIS  The details of how you feel help guide your caregiver in finding out what is causing the fatigue. You will be asked about your present and past health condition. It is  important to review all medicines that you take, including prescription and non-prescription items. A thorough exam will be done. You will be questioned about your feelings, habits, and normal lifestyle. Your caregiver may suggest blood tests, urine tests, or other tests to look for common medical causes of fatigue.  TREATMENT  Fatigue is treated by correcting the underlying cause. For example, if you have continuous pain or depression, treating these causes will improve how you feel. Similarly, adjusting the dose of certain medicines will help in reducing fatigue.  HOME CARE INSTRUCTIONS   Try to get the required amount of good sleep every night.  Eat a healthy and nutritious diet, and drink enough water throughout the day.  Practice ways of relaxing (including yoga or meditation).  Exercise regularly.  Make plans to change situations that cause stress. Act on those plans so that stresses decrease over time. Keep your work and personal routine reasonable.  Avoid street drugs and minimize use of alcohol.  Start taking a daily multivitamin after consulting your caregiver. SEEK MEDICAL CARE IF:   You have persistent tiredness, which cannot be accounted for.  You have fever.  You have unintentional weight loss.  You have headaches.  You have disturbed sleep throughout the night.  You are feeling sad.  You have constipation.  You have dry skin.  You have gained weight.  You are taking any new or different medicines that you suspect are causing fatigue.  You are unable to sleep at night.  You develop any unusual swelling of your legs or other parts of your body. SEEK IMMEDIATE MEDICAL CARE IF:   You are feeling confused.  Your  vision is blurred.  You feel faint or pass out.  You develop severe headache.  You develop severe abdominal, pelvic, or back pain.  You develop chest pain, shortness of breath, or an irregular or fast heartbeat.  You are unable to pass a  normal amount of urine.  You develop abnormal bleeding such as bleeding from the rectum or you vomit blood.  You have thoughts about harming yourself or committing suicide.  You are worried that you might harm someone else. MAKE SURE YOU:   Understand these instructions.  Will watch your condition.  Will get help right away if you are not doing well or get worse. Document Released: 07/18/2007 Document Revised: 12/13/2011 Document Reviewed: 07/18/2007 [facility] Patient Information 2015 [place], [place]. This information is not intended to replace advice given to you by your health care provider. Make sure you discuss any questions you have with your health care provider.

## 2014-03-27 NOTE — ED Notes (Signed)
Pt states that she has been having weakness, SOB, and leg cramps x 1 wk.  Seems to think that her celebrex has something to do with it but states that she has been taking celebrex for several years.

## 2014-03-27 NOTE — ED Notes (Signed)
Walked pt down the hall and back to her room, O2 stayed between 99 and 100

## 2014-03-27 NOTE — ED Notes (Signed)
Patient having hive-like rash to both upper arms, when asked if she is allergic to anything like latex, patient responded she didn't know.  Dr. Wyvonnia Dusky notified.

## 2014-03-27 NOTE — ED Provider Notes (Signed)
CSN: [id number]     Arrival date & time 03/27/14  1107 History   First MD Initiated Contact with Patient 03/27/14 1130     Chief Complaint  Patient presents with  . Weakness  . Shortness of Breath  . Leg Pain     (Consider location/radiation/quality/duration/timing/severity/associated sxs/prior Treatment) HPI Comments: Patient present with one week history of fatigue, generalized weakness, feeling wiped out, shortness of breath, intermittent leg cramps and chest tightness. He believes this may be from her Celebrex or Ultram medications. She recently started Ultram one week ago but has been Celebrex years. No chest pain currently. She is intermittent chest tightness that comes and goes, lasts a few seconds at a time and doesn't radiate. Denies any cough, fever, nausea or vomiting. No abdominal pain. No nausea vomiting. She has a history of hypothyroidism and states that he complained to medications. She's had a poor appetite. He denies any history of heart or lung problems.  The history is provided by the patient and the spouse.    Past Medical History  Diagnosis Date  . Thyroid disease   . Depression   . Hypercholesteremia   . Hypertension   . Cataract     Bil   Past Surgical History  Procedure Laterality Date  . Joint replacement  2010    left knee  . Foot surgery  2011    right foot  . Cholecystectomy    . Colonoscopy    . Polypectomy    . Tonsillectomy     Family History  Problem Relation Age of Onset  . Heart disease Mother    History  Substance Use Topics  . Smoking status: Former Smoker    Types: Cigarettes    Quit date: 11/04/2009  . Smokeless tobacco: Never Used  . Alcohol Use: No   OB History   Grav Para Term Preterm Abortions TAB SAB Ect Mult Living                 Review of Systems  Constitutional: Positive for activity change, appetite change and fatigue. Negative for fever.  Eyes: Negative for visual disturbance.  Respiratory: Positive for chest  tightness and shortness of breath. Negative for cough.   Cardiovascular: Negative for chest pain.  Gastrointestinal: Negative for nausea, vomiting and abdominal pain.  Genitourinary: Negative for vaginal bleeding and vaginal discharge.  Musculoskeletal: Positive for arthralgias and myalgias. Negative for neck pain and neck stiffness.  Skin: Negative for rash.  Neurological: Positive for weakness.  A complete 10 system review of systems was obtained and all systems are negative except as noted in the HPI and PMH.      Allergies  Penicillins and Amoxicillin  Home Medications   Prior to Admission medications   Medication Sig Start Date End Date Taking? Authorizing Provider  ALPRAZolam (XANAX) 0.25 MG tablet Take 0.25 mg by mouth 3 (three) times daily as needed. For anxiety   Yes Historical Provider, MD  calcium carbonate (OS-CAL - DOSED IN MG OF ELEMENTAL CALCIUM) 1250 MG tablet Take 1 tablet by mouth 2 (two) times daily.    Yes Historical Provider, MD  celecoxib (CELEBREX) 200 MG capsule Take 200 mg by mouth daily.  03/13/14  Yes Historical Provider, MD  ferrous sulfate 325 (65 FE) MG tablet Take 325 mg by mouth daily with breakfast.   Yes Historical Provider, MD  fish oil-omega-3 fatty acids 1000 MG capsule Take 1 g by mouth 2 (two) times daily.    Yes Historical Provider,  MD  gabapentin (NEURONTIN) 300 MG capsule Take 300 mg by mouth at bedtime.   Yes Historical Provider, MD  hydrochlorothiazide (MICROZIDE) 12.5 MG capsule Take 12.5 mg by mouth daily.   Yes Historical Provider, MD  levothyroxine (SYNTHROID, LEVOTHROID) 137 MCG tablet Take 137 mcg by mouth daily.   Yes Historical Provider, MD  Multiple Vitamin (MULTIVITAMIN) tablet Take 1 tablet by mouth daily.   Yes Historical Provider, MD  neomycin-polymyxin b-dexamethasone (MAXITROL) 3.5-10000-0.1 OINT Place 1 application into the right eye 4 (four) times daily.  11/12/13  Yes Historical Provider, MD  omeprazole (PRILOSEC) 40 MG capsule  Take 40 mg by mouth daily.   Yes Historical Provider, MD  simvastatin (ZOCOR) 40 MG tablet Take 40 mg by mouth every evening.   Yes Historical Provider, MD  traMADol (ULTRAM) 50 MG tablet Take 50 mg by mouth every 6 (six) hours as needed for moderate pain.  10/08/13  Yes Historical Provider, MD  traZODone (DESYREL) 150 MG tablet Take 150 mg by mouth at bedtime.   Yes Historical Provider, MD  venlafaxine XR (EFFEXOR-XR) 150 MG 24 hr capsule Take 150 mg by mouth daily with breakfast.   Yes Historical Provider, MD  venlafaxine XR (EFFEXOR-XR) 75 MG 24 hr capsule Take 75 mg by mouth at bedtime.    Yes Historical Provider, MD  VOLTAREN 1 % GEL Apply 2 g topically daily.  10/17/13  Yes Historical Provider, MD  zolpidem (AMBIEN) 10 MG tablet Take 10 mg by mouth at bedtime.   Yes Historical Provider, MD   BP 188/75  Pulse 70  Temp(Src) 98.5 F (36.9 C) (Oral)  Resp 7  SpO2 99% Physical Exam  Nursing note and vitals reviewed. Constitutional: She is oriented to person, place, and time. She appears well-developed and well-nourished. No distress.  Appears fatigued, hoarse voice  HENT:  Head: Normocephalic and atraumatic.  Mouth/Throat: Oropharynx is clear and moist. No oropharyngeal exudate.  Eyes: Conjunctivae and EOM are normal. Pupils are equal, round, and reactive to light.  Neck: Normal range of motion. Neck supple.  No meningismus.  Cardiovascular: Normal rate, regular rhythm, normal heart sounds and intact distal pulses.   No murmur heard. Pulmonary/Chest: Effort normal and breath sounds normal. No respiratory distress. She exhibits no tenderness.  Abdominal: Soft. There is no tenderness. There is no rebound and no guarding.  Musculoskeletal: Normal range of motion. She exhibits no edema and no tenderness.  Neurological: She is alert and oriented to person, place, and time. No cranial nerve deficit. She exhibits normal muscle tone. Coordination normal.  Poor effort on her exam throughout.4/5  strength throughout. CN 2-12 intact. Negative Romberg. Equal grip strength. Sensation intact. Gait is normal.   Skin: Skin is warm.  Psychiatric: She has a normal mood and affect. Her behavior is normal.    ED Course  Procedures (including critical care time) Labs Review Labs Reviewed  BASIC METABOLIC PANEL - Abnormal; Notable for the following:    GFR calc non Af Amer 71 (*)    GFR calc Af Amer 83 (*)    All other components within normal limits  CBC  HEPATIC FUNCTION PANEL  LIPASE, BLOOD  D-DIMER, QUANTITATIVE  PRO B NATRIURETIC PEPTIDE  URINALYSIS, ROUTINE W REFLEX MICROSCOPIC  CK  TSH  T4, FREE  TROPONIN I  MAGNESIUM  I-STAT TROPOININ, ED    Imaging Review Dg Chest 2 View (if Patient Has Fever And/or Copd)  03/27/2014   CLINICAL DATA:  WEAKNESS SHORTNESS OF BREATH LEG PAIN  EXAM: CHEST  2 VIEW  COMPARISON:  None.  FINDINGS: The heart size and mediastinal contours are within normal limits. Both lungs are clear. The visualized skeletal structures are unremarkable.  IMPRESSION: No active cardiopulmonary disease.   Electronically Signed   By: Margaree Mackintosh M.D.   On: 03/27/2014 11:56     EKG Interpretation   Date/Time:  Wednesday March 27 2014 15:11:33 EDT Ventricular Rate:  62 PR Interval:  163 QRS Duration: 102 QT Interval:  539 QTC Calculation: 547 R Axis:   65 Text Interpretation:  Sinus rhythm Nonspecific T abnrm, anterolateral  leads Prolonged QT interval QT has shortened Baseline wander in lead(s) II  aVR No significant change was found Confirmed by [place]  [phone]) on 03/27/2014 3:15:35 PM      MDM   Final diagnoses:  Other fatigue   Generalized weakness with fatigue, shortness of breath, chest tightness and leg cramps. No fever.\  EKG with nonspecific T-wave inversions laterally. nonfocal neuro exam Chest x-ray is negative. D-dimer is negative. Ck normal.  Labs on her remarkable otherwise. TSH is normal  Patient develop urticaria to  bilateral arms in the ED.  She had not received any medications.  No chest pain, throat swelling or tongue swelling.  Benadryl and pepcid given with improvement.  She reports having similar episodes at home without etiology.  Patient ambulatory in the ED without desaturation.  Troponin negative x2. D-dimer negative.  Negative workup discussed the patient's PCP Dr.Kohut. Her QT today slightly more prolonged than previous. There are also questionable U waves on her EKG.  Her potassium and magnesium are normal.  This could be from her Effexor. He wishes to see her in the office tomorrow. Patient feeling better and requesting to go home.  Suspect component of anxiety to her presentation.  Encouraged close follow up with Dr. Wilson Singer tomorrow. Return precautions discussed.   BP 188/75  Pulse 70  Temp(Src) 98.5 F (36.9 C) (Oral)  Resp 7  SpO2 99%    Ezequiel Essex, MD 03/27/14 1839

## 2014-04-22 ENCOUNTER — Ambulatory Visit: Payer: Medicare Other | Admitting: Internal Medicine

## 2014-05-08 ENCOUNTER — Other Ambulatory Visit (HOSPITAL_COMMUNITY): Payer: Self-pay | Admitting: Specialist

## 2014-05-08 ENCOUNTER — Telehealth: Payer: Self-pay | Admitting: Internal Medicine

## 2014-05-08 DIAGNOSIS — M25562 Pain in left knee: Secondary | ICD-10-CM

## 2014-05-08 DIAGNOSIS — Z96659 Presence of unspecified artificial knee joint: Secondary | ICD-10-CM

## 2014-05-08 NOTE — Telephone Encounter (Signed)
Returned call to patient no answer.LMTC. 

## 2014-05-08 NOTE — Telephone Encounter (Signed)
Per answering service-Please call,she thinks she have blood clots.

## 2014-05-09 NOTE — Telephone Encounter (Signed)
Returned call to patient no answer.LMTC. 

## 2014-05-10 NOTE — Telephone Encounter (Signed)
Returned call to patient she stated everything ok.Stated she went to [facility] this week for pain in rt knee.Stated they gave her injection in rt knee and it was better.

## 2014-05-13 ENCOUNTER — Encounter (HOSPITAL_COMMUNITY)
Admission: RE | Admit: 2014-05-13 | Discharge: 2014-05-13 | Disposition: A | Discharge status: Home / Self Care | Payer: Medicare Other | Source: Ambulatory Visit | Attending: Diagnostic Radiology | Admitting: Diagnostic Radiology

## 2014-05-13 ENCOUNTER — Encounter (HOSPITAL_COMMUNITY)
Admission: RE | Admit: 2014-05-13 | Discharge: 2014-05-13 | Disposition: A | Discharge status: Home / Self Care | Payer: Medicare Other | Source: Ambulatory Visit | Attending: Specialist | Admitting: Specialist

## 2014-05-13 DIAGNOSIS — T8489XA Other specified complication of internal orthopedic prosthetic devices, implants and grafts, initial encounter: Secondary | ICD-10-CM | POA: Diagnosis present

## 2014-05-13 DIAGNOSIS — Y831 Surgical operation with implant of artificial internal device as the cause of abnormal reaction of the patient, or of later complication, without mention of misadventure at the time of the procedure: Secondary | ICD-10-CM | POA: Diagnosis not present

## 2014-05-13 DIAGNOSIS — Z96659 Presence of unspecified artificial knee joint: Secondary | ICD-10-CM | POA: Diagnosis not present

## 2014-05-13 DIAGNOSIS — M949 Disorder of cartilage, unspecified: Secondary | ICD-10-CM

## 2014-05-13 DIAGNOSIS — M25569 Pain in unspecified knee: Secondary | ICD-10-CM | POA: Insufficient documentation

## 2014-05-13 DIAGNOSIS — Y929 Unspecified place or not applicable: Secondary | ICD-10-CM | POA: Diagnosis not present

## 2014-05-13 DIAGNOSIS — M25562 Pain in left knee: Secondary | ICD-10-CM

## 2014-05-13 DIAGNOSIS — M899 Disorder of bone, unspecified: Secondary | ICD-10-CM | POA: Insufficient documentation

## 2014-05-13 MED ORDER — TECHNETIUM TC 99M MEDRONATE IV KIT
25.0000 | PACK | Freq: Once | INTRAVENOUS | Status: AC | PRN
  Administered 2014-05-13: 25 via INTRAVENOUS

## 2014-06-04 ENCOUNTER — Emergency Department (HOSPITAL_COMMUNITY)
Admission: EM | Admit: 2014-06-04 | Discharge: 2014-06-04 | Disposition: A | Discharge status: Home / Self Care | Payer: Medicare Other | Source: Home / Self Care | Attending: Family Medicine | Admitting: Family Medicine

## 2014-06-04 ENCOUNTER — Encounter (HOSPITAL_COMMUNITY): Payer: Self-pay | Admitting: Emergency Medicine

## 2014-06-04 DIAGNOSIS — W57XXXA Bitten or stung by nonvenomous insect and other nonvenomous arthropods, initial encounter: Secondary | ICD-10-CM

## 2014-06-04 DIAGNOSIS — S0086XA Insect bite (nonvenomous) of other part of head, initial encounter: Secondary | ICD-10-CM

## 2014-06-04 DIAGNOSIS — S1096XA Insect bite of unspecified part of neck, initial encounter: Secondary | ICD-10-CM

## 2014-06-04 MED ORDER — FLUTICASONE PROPIONATE 0.05 % EX CREA: TOPICAL_CREAM | Freq: Two times a day (BID) | CUTANEOUS | Status: DC

## 2014-06-04 MED ORDER — TRIAMCINOLONE ACETONIDE 40 MG/ML IJ SUSP
40.0000 mg | Freq: Once | INTRAMUSCULAR | Status: AC
  Administered 2014-06-04: 40 mg via INTRAMUSCULAR

## 2014-06-04 MED ORDER — TRIAMCINOLONE ACETONIDE 40 MG/ML IJ SUSP
INTRAMUSCULAR | Status: AC
  Filled 2014-06-04: qty 1

## 2014-06-04 MED ORDER — CEPHALEXIN 500 MG PO CAPS: 500.0000 mg | ORAL_CAPSULE | Freq: Four times a day (QID) | ORAL | Status: DC

## 2014-06-04 NOTE — ED Notes (Signed)
Pt  Felt  A  Sting r  Side  Face     4  Days  Ago  And  She   Now  Has  Pain  Redness       And   Swelling  To  r  Side  Of  Face        She  Reports  The  Area is  painfull  And  Her  Eyes  Are  Watering   Awake and  Alert  Slight  Change  In  Voice

## 2014-06-04 NOTE — ED Provider Notes (Signed)
CSN: [id number]     Arrival date & time 06/04/14  6789 History   First MD Initiated Contact with Patient 06/04/14 1013     Chief Complaint  Patient presents with  . Insect Bite   (Consider location/radiation/quality/duration/timing/severity/associated sxs/prior Treatment) Patient is a 68 y.o. female presenting with rash. The history is provided by the patient and a relative.  Rash Location:  Face Facial rash location:  R cheek Quality: dryness, painful, redness and swelling   Quality: not blistering and not draining   Pain details:    Quality:  Stinging and sore   Severity:  Moderate   Onset quality:  Sudden   Duration:  4 days   Progression:  Unchanged Severity:  Moderate Chronicity:  New Relieved by:  None tried Worsened by:  Nothing tried Associated symptoms: no fever, no periorbital edema, no shortness of breath, no throat swelling and no tongue swelling     Past Medical History  Diagnosis Date  . Thyroid disease   . Depression   . Hypercholesteremia   . Hypertension   . Cataract     Bil   Past Surgical History  Procedure Laterality Date  . Joint replacement  2010    left knee  . Foot surgery  2011    right foot  . Cholecystectomy    . Colonoscopy    . Polypectomy    . Tonsillectomy     Family History  Problem Relation Age of Onset  . Heart disease Mother    History  Substance Use Topics  . Smoking status: Former Smoker    Types: Cigarettes    Quit date: 11/04/2009  . Smokeless tobacco: Never Used  . Alcohol Use: No   OB History   Grav Para Term Preterm Abortions TAB SAB Ect Mult Living                 Review of Systems  Constitutional: Negative.  Negative for fever.  HENT: Positive for facial swelling. Negative for congestion, mouth sores and rhinorrhea.   Eyes: Negative for photophobia, pain, discharge, redness, itching and visual disturbance.  Respiratory: Negative for shortness of breath.   Skin: Positive for rash. Negative for wound.     Allergies  Penicillins and Amoxicillin  Home Medications   Prior to Admission medications   Medication Sig Start Date End Date Taking? Authorizing Provider  ALPRAZolam (XANAX) 0.25 MG tablet Take 0.25 mg by mouth 3 (three) times daily as needed. For anxiety    Historical Provider, MD  calcium carbonate (OS-CAL - DOSED IN MG OF ELEMENTAL CALCIUM) 1250 MG tablet Take 1 tablet by mouth 2 (two) times daily.     Historical Provider, MD  celecoxib (CELEBREX) 200 MG capsule Take 200 mg by mouth daily.  03/13/14   Historical Provider, MD  cephALEXin (KEFLEX) 500 MG capsule Take 1 capsule (500 mg total) by mouth 4 (four) times daily. Take all of medicine and drink lots of fluids 06/04/14   Billy Fischer, MD  ferrous sulfate 325 (65 FE) MG tablet Take 325 mg by mouth daily with breakfast.    Historical Provider, MD  fish oil-omega-3 fatty acids 1000 MG capsule Take 1 g by mouth 2 (two) times daily.     Historical Provider, MD  fluticasone (CUTIVATE) 0.05 % cream Apply topically 2 (two) times daily. 06/04/14   Billy Fischer, MD  gabapentin (NEURONTIN) 300 MG capsule Take 300 mg by mouth at bedtime.    Historical Provider, MD  hydrochlorothiazide (  MICROZIDE) 12.5 MG capsule Take 12.5 mg by mouth daily.    Historical Provider, MD  levothyroxine (SYNTHROID, LEVOTHROID) 137 MCG tablet Take 137 mcg by mouth daily.    Historical Provider, MD  Multiple Vitamin (MULTIVITAMIN) tablet Take 1 tablet by mouth daily.    Historical Provider, MD  neomycin-polymyxin b-dexamethasone (MAXITROL) 3.5-10000-0.1 OINT Place 1 application into the right eye 4 (four) times daily.  11/12/13   Historical Provider, MD  omeprazole (PRILOSEC) 40 MG capsule Take 40 mg by mouth daily.    Historical Provider, MD  simvastatin (ZOCOR) 40 MG tablet Take 40 mg by mouth every evening.    Historical Provider, MD  traMADol (ULTRAM) 50 MG tablet Take 50 mg by mouth every 6 (six) hours as needed for moderate pain.  10/08/13   Historical Provider, MD   traZODone (DESYREL) 150 MG tablet Take 150 mg by mouth at bedtime.    Historical Provider, MD  venlafaxine XR (EFFEXOR-XR) 150 MG 24 hr capsule Take 150 mg by mouth daily with breakfast.    Historical Provider, MD  venlafaxine XR (EFFEXOR-XR) 75 MG 24 hr capsule Take 75 mg by mouth at bedtime.     Historical Provider, MD  VOLTAREN 1 % GEL Apply 2 g topically daily.  10/17/13   Historical Provider, MD  zolpidem (AMBIEN) 10 MG tablet Take 10 mg by mouth at bedtime.    Historical Provider, MD   BP 153/89  Pulse 69  Temp(Src) 97.9 F (36.6 C) (Oral)  Resp 20  SpO2 96% Physical Exam  Nursing note and vitals reviewed. Constitutional: She is oriented to person, place, and time. She appears well-developed and well-nourished.  HENT:  Head: Normocephalic.  Right Ear: External ear normal.  Left Ear: External ear normal.  Nose: Nose normal.  Mouth/Throat: Oropharynx is clear and moist.  Eyes: Conjunctivae are normal. Pupils are equal, round, and reactive to light.  Neck: Normal range of motion. Neck supple.  Neurological: She is alert and oriented to person, place, and time.  Skin: Skin is warm and dry. Rash noted.  Erythematous tender sts to right infraorbital cheek with preauricular tenderness, 2 lesions assoc., no other facial or neck involvement, nose and eye intact.    ED Course  Procedures (including critical care time) Labs Review Labs Reviewed - No data to display  Imaging Review No results found.   MDM   1. Insect bite of face with local reaction, initial encounter        Billy Fischer, MD 06/04/14 1042

## 2014-06-04 NOTE — Discharge Instructions (Signed)
Cool cloth to face before cream, take antibiotic as prescribed, return on thurs for recheck if needed or see your doctor .

## 2014-06-05 NOTE — ED Notes (Signed)
Pt called  State  Face  Getting  Worse      Advised to  Return  For  Recheck          pts  Daughter  Called     And  Was  Advised  As  Well

## 2014-06-25 ENCOUNTER — Ambulatory Visit (INDEPENDENT_AMBULATORY_CARE_PROVIDER_SITE_OTHER): Payer: Medicare Other | Admitting: Radiology

## 2014-06-25 DIAGNOSIS — Z23 Encounter for immunization: Secondary | ICD-10-CM

## 2014-08-14 ENCOUNTER — Other Ambulatory Visit: Payer: Self-pay | Admitting: Obstetrics and Gynecology

## 2014-08-14 DIAGNOSIS — N63 Unspecified lump in unspecified breast: Secondary | ICD-10-CM

## 2014-08-27 ENCOUNTER — Ambulatory Visit
Admission: RE | Admit: 2014-08-27 | Discharge: 2014-08-27 | Disposition: A | Discharge status: Home / Self Care | Payer: Medicare Other | Source: Ambulatory Visit | Attending: Obstetrics and Gynecology | Admitting: Obstetrics and Gynecology

## 2014-08-27 DIAGNOSIS — N63 Unspecified lump in unspecified breast: Secondary | ICD-10-CM

## 2015-02-01 ENCOUNTER — Ambulatory Visit (INDEPENDENT_AMBULATORY_CARE_PROVIDER_SITE_OTHER): Payer: Medicare Other | Admitting: Family Medicine

## 2015-02-01 VITALS — BP 124/86 | HR 85 | Temp 98.3°F | Resp 16 | Ht 62.0 in | Wt 194.0 lb

## 2015-02-01 DIAGNOSIS — L03311 Cellulitis of abdominal wall: Secondary | ICD-10-CM | POA: Diagnosis not present

## 2015-02-01 LAB — POCT CBC
Granulocyte percent: 66.8 %G (ref 37–80)
HCT, POC: 45.4 % (ref 37.7–47.9)
Hemoglobin: 14.6 g/dL (ref 12.2–16.2)
Lymph, poc: 1.9 (ref 0.6–3.4)
MCH, POC: 29.5 pg (ref 27–31.2)
MCHC: 32.1 g/dL (ref 31.8–35.4)
MCV: 92.1 fL (ref 80–97)
MID (CBC): 0.5 (ref 0–0.9)
MPV: 6.1 fL (ref 0–99.8)
PLATELET COUNT, POC: 307 10*3/uL (ref 142–424)
POC GRANULOCYTE: 4.9 (ref 2–6.9)
POC LYMPH %: 26.2 % (ref 10–50)
POC MID %: 7 %M (ref 0–12)
RBC: 4.93 M/uL (ref 4.04–5.48)
RDW, POC: 14.4 %
WBC: 7.4 10*3/uL (ref 4.6–10.2)

## 2015-02-01 MED ORDER — DOXYCYCLINE HYCLATE 100 MG PO TABS: 100.0000 mg | ORAL_TABLET | Freq: Two times a day (BID) | ORAL | Status: DC

## 2015-02-01 MED ORDER — CEFTRIAXONE SODIUM 1 G IJ SOLR
1.0000 g | Freq: Once | INTRAMUSCULAR | Status: AC
  Administered 2015-02-01: 1 g via INTRAMUSCULAR

## 2015-02-01 NOTE — Patient Instructions (Signed)
Start antibiotic by mouth twice per day. Recheck with Dr. Carlota Raspberry Monday morning. Sooner or to emergency room if worsening. Tylenol over the counter if needed for pain. Warm compresses 4-5 times per day to affected area.   Return to the clinic or go to the nearest emergency room if any of your symptoms worsen or new symptoms occur.   Cellulitis Cellulitis is an infection of the skin and the tissue beneath it. The infected area is usually red and tender. Cellulitis occurs most often in the arms and lower legs.  CAUSES  Cellulitis is caused by bacteria that enter the skin through cracks or cuts in the skin. The most common types of bacteria that cause cellulitis are staphylococci and streptococci. SIGNS AND SYMPTOMS   Redness and warmth.  Swelling.  Tenderness or pain.  Fever. DIAGNOSIS  Your health care provider can usually determine what is wrong based on a physical exam. Blood tests may also be done. TREATMENT  Treatment usually involves taking an antibiotic medicine. HOME CARE INSTRUCTIONS   Take your antibiotic medicine as directed by your health care provider. Finish the antibiotic even if you start to feel better.  Keep the infected arm or leg elevated to reduce swelling.  Apply a warm cloth to the affected area up to 4 times per day to relieve pain.  Take medicines only as directed by your health care provider.  Keep all follow-up visits as directed by your health care provider. SEEK MEDICAL CARE IF:   You notice red streaks coming from the infected area.  Your red area gets larger or turns dark in color.  Your bone or joint underneath the infected area becomes painful after the skin has healed.  Your infection returns in the same area or another area.  You notice a swollen bump in the infected area.  You develop new symptoms.  You have a fever. SEEK IMMEDIATE MEDICAL CARE IF:   You feel very sleepy.  You develop vomiting or diarrhea.  You have a general ill  feeling (malaise) with muscle aches and pains. MAKE SURE YOU:   Understand these instructions.  Will watch your condition.  Will get help right away if you are not doing well or get worse. Document Released: 06/30/2005 Document Revised: 02/04/2014 Document Reviewed: 12/06/2011 [facility] Patient Information 2015 [place], [place]. This information is not intended to replace advice given to you by your health care provider. Make sure you discuss any questions you have with your health care provider.

## 2015-02-01 NOTE — Progress Notes (Addendum)
Subjective:    Patient ID: Dawn Kim, female    DOB: 03/13/1946, 69 y.o.   MRN: [id number] This chart was scribed for Merri Ray, MD by Marti Sleigh, Medical Scribe. This patient was seen in Room 10 and the patient's care was started a 3:07 PM.  Chief Complaint  Patient presents with  . Skin Ulcer    lower abdomen     HPI HPI Comments: Dawn Kim is a 69 y.o. female who presents to [facility] complaining of a skin ulcer that stated four days ago. Pt endorses associated pain and redness. Pt state she had a small bump several days ago that she put medicated powder on. Pt states the bump has developed into an ulcer. Pt denies fever. Pt endorses associated mild chills.    Pt states she is not allergic to penicillin or amoxicillin. Pt states she had a rash one time with a large dose of penicillin, and that is why the allergy is listed. Pt has taken keflex in the past without reaction.     Patient Active Problem List   Diagnosis Date Noted  . HTN (hypertension) 01/20/2012  . Lipid disorder 01/20/2012  . Mood disorder 01/20/2012   Past Medical History  Diagnosis Date  . Thyroid disease   . Depression   . Hypercholesteremia   . Hypertension   . Cataract     Bil   Past Surgical History  Procedure Laterality Date  . Joint replacement  2010    left knee  . Foot surgery  2011    right foot  . Cholecystectomy    . Colonoscopy    . Polypectomy    . Tonsillectomy     Allergies  Allergen Reactions  . Penicillins Hives  . Amoxicillin Rash   Prior to Admission medications   Medication Sig Start Date End Date Taking? Authorizing Provider  ALPRAZolam (XANAX) 0.25 MG tablet Take 0.25 mg by mouth 3 (three) times daily as needed. For anxiety   Yes Historical Provider, MD  calcium carbonate (OS-CAL - DOSED IN MG OF ELEMENTAL CALCIUM) 1250 MG tablet Take 1 tablet by mouth 2 (two) times daily.    Yes Historical Provider, MD  celecoxib (CELEBREX) 200 MG capsule Take 200 mg by  mouth daily.  03/13/14  Yes Historical Provider, MD  cephALEXin (KEFLEX) 500 MG capsule Take 1 capsule (500 mg total) by mouth 4 (four) times daily. Take all of medicine and drink lots of fluids 06/04/14  Yes Billy Fischer, MD  ferrous sulfate 325 (65 FE) MG tablet Take 325 mg by mouth daily with breakfast.   Yes Historical Provider, MD  fish oil-omega-3 fatty acids 1000 MG capsule Take 1 g by mouth 2 (two) times daily.    Yes Historical Provider, MD  fluticasone (CUTIVATE) 0.05 % cream Apply topically 2 (two) times daily. 06/04/14  Yes Billy Fischer, MD  gabapentin (NEURONTIN) 300 MG capsule Take 300 mg by mouth at bedtime.   Yes Historical Provider, MD  hydrochlorothiazide (MICROZIDE) 12.5 MG capsule Take 12.5 mg by mouth daily.   Yes Historical Provider, MD  levothyroxine (SYNTHROID, LEVOTHROID) 137 MCG tablet Take 137 mcg by mouth daily.   Yes Historical Provider, MD  Multiple Vitamin (MULTIVITAMIN) tablet Take 1 tablet by mouth daily.   Yes Historical Provider, MD  neomycin-polymyxin b-dexamethasone (MAXITROL) 3.5-10000-0.1 OINT Place 1 application into the right eye 4 (four) times daily.  11/12/13  Yes Historical Provider, MD  traZODone (DESYREL) 150 MG tablet Take  150 mg by mouth at bedtime.   Yes Historical Provider, MD  venlafaxine XR (EFFEXOR-XR) 150 MG 24 hr capsule Take 150 mg by mouth daily with breakfast.   Yes Historical Provider, MD  venlafaxine XR (EFFEXOR-XR) 75 MG 24 hr capsule Take 75 mg by mouth at bedtime.    Yes Historical Provider, MD  VOLTAREN 1 % GEL Apply 2 g topically daily.  10/17/13  Yes Historical Provider, MD  zolpidem (AMBIEN) 10 MG tablet Take 10 mg by mouth at bedtime.   Yes Historical Provider, MD  omeprazole (PRILOSEC) 40 MG capsule Take 40 mg by mouth daily.    Historical Provider, MD  simvastatin (ZOCOR) 40 MG tablet Take 40 mg by mouth every evening.    Historical Provider, MD  traMADol (ULTRAM) 50 MG tablet Take 50 mg by mouth every 6 (six) hours as needed for  moderate pain.  10/08/13   Historical Provider, MD   History   Social History  . Marital Status: Married    Spouse Name: N/A  . Number of Children: N/A  . Years of Education: N/A   Occupational History  . Not on file.   Social History Main Topics  . Smoking status: Former Smoker    Types: Cigarettes    Quit date: 11/04/2009  . Smokeless tobacco: Never Used  . Alcohol Use: No  . Drug Use: No  . Sexual Activity: Not on file   Other Topics Concern  . Not on file   Social History Narrative    Review of Systems  Constitutional: Positive for chills. Negative for fever.  Gastrointestinal: Positive for abdominal pain.       Abdominal wall pain only.  Skin: Positive for color change and wound.       Objective:   Physical Exam  Constitutional: She is oriented to person, place, and time. She appears well-developed and well-nourished. No distress.  HENT:  Head: Normocephalic and atraumatic.  Eyes: Pupils are equal, round, and reactive to light.  Neck: Neck supple.  Cardiovascular: Normal rate.   Pulmonary/Chest: Effort normal. No respiratory distress.  Musculoskeletal: Normal range of motion.  Neurological: She is alert and oriented to person, place, and time. Coordination normal.  Skin: Skin is warm and dry. She is not diaphoretic.  A band of erythema over her lower right abdomen. 1.5cm indurated elevated lump with central opening measuring approximately 1cm with yellow-white exudate. No apparent fluctuance of central area. Area outlined with skin marker. The induration around that central measures approximately 4cm by 3 cm. The erythema measured 24cm horizontally, by 6cm vertically.   Psychiatric: She has a normal mood and affect. Her behavior is normal.  Nursing note and vitals reviewed.    Filed Vitals:   02/01/15 1441  BP: 124/86  Pulse: 85  Temp: 98.3 F (36.8 C)  TempSrc: Oral  Resp: 16  Height: 5\' 2"  (1.575 m)  Weight: 194 lb (87.998 kg)  SpO2: 93%    Results for orders placed or performed in visit on 02/01/15  POCT CBC  Result Value Ref Range   WBC 7.4 4.6 - 10.2 K/uL   Lymph, poc 1.9 0.6 - 3.4   POC LYMPH PERCENT 26.2 10 - 50 %L   MID (cbc) 0.5 0 - 0.9   POC MID % 7.0 0 - 12 %M   POC Granulocyte 4.9 2 - 6.9   Granulocyte percent 66.8 37 - 80 %G   RBC 4.93 4.04 - 5.48 M/uL   Hemoglobin 14.6 12.2 -  16.2 g/dL   HCT, POC 45.4 37.7 - 47.9 %   MCV 92.1 80 - 97 fL   MCH, POC 29.5 27 - 31.2 pg   MCHC 32.1 31.8 - 35.4 g/dL   RDW, POC 14.4 %   Platelet Count, POC 307 142 - 424 K/uL   MPV 6.1 0 - 99.8 fL       Assessment & Plan:   Dawn Kim is a 69 y.o. female Cellulitis of abdominal wall - Plan: cefTRIAXone (ROCEPHIN) injection 1 g, doxycycline (VIBRA-TABS) 100 MG tablet, Wound culture, POCT CBC  - start doxycycline, rocephin 1 gram x1.   - warm compresses/sx care.   -outlined involved area for recheck in 24-48 hrs.    Meds ordered this encounter  Medications  . cefTRIAXone (ROCEPHIN) injection 1 g    Sig:     Order Specific Question:  Antibiotic Indication:    Answer:  Cellulitis  . doxycycline (VIBRA-TABS) 100 MG tablet    Sig: Take 1 tablet (100 mg total) by mouth 2 (two) times daily.    Dispense:  20 tablet    Refill:  0   Patient Instructions  Start antibiotic by mouth twice per day. Recheck with Dr. Carlota Raspberry Monday morning. Sooner or to emergency room if worsening. Tylenol over the counter if needed for pain. Warm compresses 4-5 times per day to affected area.   Return to the clinic or go to the nearest emergency room if any of your symptoms worsen or new symptoms occur.   Cellulitis Cellulitis is an infection of the skin and the tissue beneath it. The infected area is usually red and tender. Cellulitis occurs most often in the arms and lower legs.  CAUSES  Cellulitis is caused by bacteria that enter the skin through cracks or cuts in the skin. The most common types of bacteria that cause cellulitis are  staphylococci and streptococci. SIGNS AND SYMPTOMS   Redness and warmth.  Swelling.  Tenderness or pain.  Fever. DIAGNOSIS  Your health care provider can usually determine what is wrong based on a physical exam. Blood tests may also be done. TREATMENT  Treatment usually involves taking an antibiotic medicine. HOME CARE INSTRUCTIONS   Take your antibiotic medicine as directed by your health care provider. Finish the antibiotic even if you start to feel better.  Keep the infected arm or leg elevated to reduce swelling.  Apply a warm cloth to the affected area up to 4 times per day to relieve pain.  Take medicines only as directed by your health care provider.  Keep all follow-up visits as directed by your health care provider. SEEK MEDICAL CARE IF:   You notice red streaks coming from the infected area.  Your red area gets larger or turns dark in color.  Your bone or joint underneath the infected area becomes painful after the skin has healed.  Your infection returns in the same area or another area.  You notice a swollen bump in the infected area.  You develop new symptoms.  You have a fever. SEEK IMMEDIATE MEDICAL CARE IF:   You feel very sleepy.  You develop vomiting or diarrhea.  You have a general ill feeling (malaise) with muscle aches and pains. MAKE SURE YOU:   Understand these instructions.  Will watch your condition.  Will get help right away if you are not doing well or get worse. Document Released: 06/30/2005 Document Revised: 02/04/2014 Document Reviewed: 12/06/2011 [facility] Patient Information 2015 [place], [place]. This information  is not intended to replace advice given to you by your health care provider. Make sure you discuss any questions you have with your health care provider.       I personally performed the services described in this documentation, which was scribed in my presence. The recorded information has been reviewed and  considered, and addended by me as needed.

## 2015-02-04 LAB — WOUND CULTURE
GRAM STAIN: NONE SEEN
Gram Stain: NONE SEEN

## 2015-02-07 ENCOUNTER — Encounter (HOSPITAL_COMMUNITY): Payer: Self-pay

## 2015-02-07 ENCOUNTER — Emergency Department (HOSPITAL_COMMUNITY)
Admission: EM | Admit: 2015-02-07 | Discharge: 2015-02-07 | Disposition: A | Discharge status: Home / Self Care | Payer: Medicare Other | Attending: Emergency Medicine | Admitting: Emergency Medicine

## 2015-02-07 DIAGNOSIS — Z7952 Long term (current) use of systemic steroids: Secondary | ICD-10-CM | POA: Diagnosis not present

## 2015-02-07 DIAGNOSIS — Z792 Long term (current) use of antibiotics: Secondary | ICD-10-CM | POA: Insufficient documentation

## 2015-02-07 DIAGNOSIS — I10 Essential (primary) hypertension: Secondary | ICD-10-CM | POA: Diagnosis not present

## 2015-02-07 DIAGNOSIS — H269 Unspecified cataract: Secondary | ICD-10-CM | POA: Diagnosis not present

## 2015-02-07 DIAGNOSIS — R21 Rash and other nonspecific skin eruption: Secondary | ICD-10-CM | POA: Diagnosis present

## 2015-02-07 DIAGNOSIS — F329 Major depressive disorder, single episode, unspecified: Secondary | ICD-10-CM | POA: Insufficient documentation

## 2015-02-07 DIAGNOSIS — Z880 Allergy status to penicillin: Secondary | ICD-10-CM | POA: Insufficient documentation

## 2015-02-07 DIAGNOSIS — L03311 Cellulitis of abdominal wall: Secondary | ICD-10-CM | POA: Diagnosis not present

## 2015-02-07 DIAGNOSIS — Z87891 Personal history of nicotine dependence: Secondary | ICD-10-CM | POA: Diagnosis not present

## 2015-02-07 DIAGNOSIS — E079 Disorder of thyroid, unspecified: Secondary | ICD-10-CM | POA: Insufficient documentation

## 2015-02-07 DIAGNOSIS — E780 Pure hypercholesterolemia: Secondary | ICD-10-CM | POA: Insufficient documentation

## 2015-02-07 DIAGNOSIS — Z79899 Other long term (current) drug therapy: Secondary | ICD-10-CM | POA: Diagnosis not present

## 2015-02-07 LAB — CBC WITH DIFFERENTIAL/PLATELET
Basophils Absolute: 0 10*3/uL (ref 0.0–0.1)
Basophils Relative: 0 % (ref 0–1)
Eosinophils Absolute: 0.2 10*3/uL (ref 0.0–0.7)
Eosinophils Relative: 3 % (ref 0–5)
HEMATOCRIT: 47.7 % — AB (ref 36.0–46.0)
HEMOGLOBIN: 15.6 g/dL — AB (ref 12.0–15.0)
LYMPHS PCT: 32 % (ref 12–46)
Lymphs Abs: 2.5 10*3/uL (ref 0.7–4.0)
MCH: 30.9 pg (ref 26.0–34.0)
MCHC: 32.7 g/dL (ref 30.0–36.0)
MCV: 94.5 fL (ref 78.0–100.0)
Monocytes Absolute: 0.8 10*3/uL (ref 0.1–1.0)
Monocytes Relative: 10 % (ref 3–12)
NEUTROS PCT: 55 % (ref 43–77)
Neutro Abs: 4.2 10*3/uL (ref 1.7–7.7)
Platelets: 280 10*3/uL (ref 150–400)
RBC: 5.05 MIL/uL (ref 3.87–5.11)
RDW: 12.8 % (ref 11.5–15.5)
WBC: 7.8 10*3/uL (ref 4.0–10.5)

## 2015-02-07 LAB — I-STAT CHEM 8, ED
BUN: 16 mg/dL (ref 6–20)
CREATININE: 1 mg/dL (ref 0.44–1.00)
Calcium, Ion: 1.16 mmol/L (ref 1.13–1.30)
Chloride: 97 mmol/L — ABNORMAL LOW (ref 101–111)
Glucose, Bld: 90 mg/dL (ref 70–99)
HCT: 49 % — ABNORMAL HIGH (ref 36.0–46.0)
HEMOGLOBIN: 16.7 g/dL — AB (ref 12.0–15.0)
POTASSIUM: 3.3 mmol/L — AB (ref 3.5–5.1)
SODIUM: 140 mmol/L (ref 135–145)
TCO2: 28 mmol/L (ref 0–100)

## 2015-02-07 MED ORDER — ONDANSETRON 4 MG PO TBDP
4.0000 mg | ORAL_TABLET | Freq: Once | ORAL | Status: AC
  Administered 2015-02-07: 4 mg via ORAL
  Filled 2015-02-07: qty 1

## 2015-02-07 MED ORDER — SULFAMETHOXAZOLE-TRIMETHOPRIM 800-160 MG PO TABS: 1.0000 | ORAL_TABLET | Freq: Two times a day (BID) | ORAL | Status: AC

## 2015-02-07 MED ORDER — SULFAMETHOXAZOLE-TRIMETHOPRIM 800-160 MG PO TABS
1.0000 | ORAL_TABLET | Freq: Once | ORAL | Status: AC
  Administered 2015-02-07: 1 via ORAL
  Filled 2015-02-07: qty 1

## 2015-02-07 MED ORDER — ONDANSETRON 8 MG PO TBDP: 8.0000 mg | ORAL_TABLET | Freq: Three times a day (TID) | ORAL | Status: DC | PRN

## 2015-02-07 NOTE — ED Provider Notes (Signed)
CSN: [id number]     Arrival date & time 02/07/15  1516 History   First MD Initiated Contact with Patient 02/07/15 1649     Chief Complaint  Patient presents with  . Cellulitis     (Consider location/radiation/quality/duration/timing/severity/associated sxs/prior Treatment) HPI 69  Y.o. Female with right abdominal  Redness beginning 10 days ago, [facility] and treated with doxycycline but not resolved.  Patient states she feels she has had some nausea, but has continued to drink fluids without difficulty.  She has not had vomiting, diarrhea, or lightheadedness.  She feels tired.  She drove here.   Past Medical History  Diagnosis Date  . Thyroid disease   . Depression   . Hypercholesteremia   . Hypertension   . Cataract     Bil   Past Surgical History  Procedure Laterality Date  . Joint replacement  2010    left knee  . Foot surgery  2011    right foot  . Cholecystectomy    . Colonoscopy    . Polypectomy    . Tonsillectomy     Family History  Problem Relation Age of Onset  . Heart disease Mother    History  Substance Use Topics  . Smoking status: Former Smoker    Types: Cigarettes    Quit date: 11/04/2009  . Smokeless tobacco: Never Used  . Alcohol Use: No   OB History    No data available     Review of Systems  All other systems reviewed and are negative.     Allergies  Penicillins and Amoxicillin  Home Medications   Prior to Admission medications   Medication Sig Start Date End Date Taking? Authorizing Provider  ALPRAZolam (XANAX) 0.25 MG tablet Take 0.25 mg by mouth 3 (three) times daily as needed. For anxiety    Historical Provider, MD  calcium carbonate (OS-CAL - DOSED IN MG OF ELEMENTAL CALCIUM) 1250 MG tablet Take 1 tablet by mouth 2 (two) times daily.     Historical Provider, MD  celecoxib (CELEBREX) 200 MG capsule Take 200 mg by mouth daily.  03/13/14   Historical Provider, MD  cephALEXin (KEFLEX) 500 MG capsule Take 1 capsule (500 mg total) by  mouth 4 (four) times daily. Take all of medicine and drink lots of fluids 06/04/14   Billy Fischer, MD  doxycycline (VIBRA-TABS) 100 MG tablet Take 1 tablet (100 mg total) by mouth 2 (two) times daily. 02/01/15   Wendie Agreste, MD  ferrous sulfate 325 (65 FE) MG tablet Take 325 mg by mouth daily with breakfast.    Historical Provider, MD  fish oil-omega-3 fatty acids 1000 MG capsule Take 1 g by mouth 2 (two) times daily.     Historical Provider, MD  fluticasone (CUTIVATE) 0.05 % cream Apply topically 2 (two) times daily. 06/04/14   Billy Fischer, MD  gabapentin (NEURONTIN) 300 MG capsule Take 300 mg by mouth at bedtime.    Historical Provider, MD  hydrochlorothiazide (MICROZIDE) 12.5 MG capsule Take 12.5 mg by mouth daily.    Historical Provider, MD  levothyroxine (SYNTHROID, LEVOTHROID) 137 MCG tablet Take 137 mcg by mouth daily.    Historical Provider, MD  Multiple Vitamin (MULTIVITAMIN) tablet Take 1 tablet by mouth daily.    Historical Provider, MD  neomycin-polymyxin b-dexamethasone (MAXITROL) 3.5-10000-0.1 OINT Place 1 application into the right eye 4 (four) times daily.  11/12/13   Historical Provider, MD  omeprazole (PRILOSEC) 40 MG capsule Take 40 mg by mouth  daily.    Historical Provider, MD  simvastatin (ZOCOR) 40 MG tablet Take 40 mg by mouth every evening.    Historical Provider, MD  traMADol (ULTRAM) 50 MG tablet Take 50 mg by mouth every 6 (six) hours as needed for moderate pain.  10/08/13   Historical Provider, MD  traZODone (DESYREL) 150 MG tablet Take 150 mg by mouth at bedtime.    Historical Provider, MD  venlafaxine XR (EFFEXOR-XR) 150 MG 24 hr capsule Take 150 mg by mouth daily with breakfast.    Historical Provider, MD  venlafaxine XR (EFFEXOR-XR) 75 MG 24 hr capsule Take 75 mg by mouth at bedtime.     Historical Provider, MD  VOLTAREN 1 % GEL Apply 2 g topically daily.  10/17/13   Historical Provider, MD  zolpidem (AMBIEN) 10 MG tablet Take 10 mg by mouth at bedtime.    Historical  Provider, MD   BP 128/72 mmHg  Pulse 84  Temp(Src) 98.4 F (36.9 C) (Oral)  Resp 16  SpO2 97% Physical Exam  Constitutional: She is oriented to person, place, and time. She appears well-developed and well-nourished.  HENT:  Head: Normocephalic and atraumatic.  Right Ear: External ear normal.  Left Ear: External ear normal.  Nose: Nose normal.  Mouth/Throat: Oropharynx is clear and moist.  Eyes: Conjunctivae and EOM are normal. Pupils are equal, round, and reactive to light.  Neck: Normal range of motion. Neck supple.  Cardiovascular: Normal rate.   Pulmonary/Chest: Effort normal and breath sounds normal.  Abdominal: Soft. Bowel sounds are normal.    Erythematous area 3x3 cm, no fluctuance or drainage.    Musculoskeletal: Normal range of motion.  Neurological: She is alert and oriented to person, place, and time.  Skin: Skin is warm. Rash noted.  Psychiatric: She has a normal mood and affect. Her behavior is normal. Thought content normal.  Nursing note and vitals reviewed.   ED Course  Procedures (including critical care time) Labs Review Labs Reviewed  CBC WITH DIFFERENTIAL/PLATELET - Abnormal; Notable for the following:    Hemoglobin 15.6 (*)    HCT 47.7 (*)    All other components within normal limits  I-STAT CHEM 8, ED - Abnormal; Notable for the following:    Potassium 3.3 (*)    Chloride 97 (*)    Hemoglobin 16.7 (*)    HCT 49.0 (*)    All other components within normal limits    Imaging Review No results found.   EKG Interpretation None      MDM   Final diagnoses:  Cellulitis of abdominal wall    Lower abdominal cellulitis with draining area- failed doxycycline, plan bactrim and will give zofran op.  Patient advised to recheck with her primary, return precautions discussed.      Pattricia Boss, MD 02/10/15 [phone]

## 2015-02-07 NOTE — ED Notes (Signed)
Measured and marked cellulitis area per MD request.  Ecchymosis measures 8cm wide x 3 cm high on right lower abdominal area.

## 2015-02-07 NOTE — ED Notes (Signed)
Pt presents complaining of what she has been informed is cellulitis on her lower right abdomen.  She says it started as a sore and then about ten days ago it started feeling sore and warm to the touch.  She went to Urgent Care six days ago and received a prescription for doxyxycline, but she has not noticed an improvement in symptoms.  She has had decreased appetite for about six days as well.  Pain is currently rated as 4/10 and feels sore and aching.  She states that the wound is draining constantly.

## 2015-02-07 NOTE — Discharge Instructions (Signed)

## 2015-02-07 NOTE — ED Notes (Addendum)
Pt has dime sized wound to abdomin. Pt was placed on antibiotics and has not improved.  Redness noted around site. Potential bite to initiate event.  Pt has been very tired also. Stating she has no energy.

## 2015-02-07 NOTE — ED Notes (Signed)
Notified Dr. Roderic Palau of pt fatigue. Order given for labs.

## 2015-03-04 ENCOUNTER — Emergency Department (HOSPITAL_COMMUNITY)
Admission: EM | Admit: 2015-03-04 | Discharge: 2015-03-04 | Disposition: A | Discharge status: Home / Self Care | Payer: Medicare Other | Attending: Emergency Medicine | Admitting: Emergency Medicine

## 2015-03-04 ENCOUNTER — Emergency Department (HOSPITAL_COMMUNITY): Payer: Medicare Other

## 2015-03-04 ENCOUNTER — Encounter (HOSPITAL_COMMUNITY): Payer: Self-pay | Admitting: Emergency Medicine

## 2015-03-04 DIAGNOSIS — Y9289 Other specified places as the place of occurrence of the external cause: Secondary | ICD-10-CM | POA: Insufficient documentation

## 2015-03-04 DIAGNOSIS — Y9389 Activity, other specified: Secondary | ICD-10-CM | POA: Diagnosis not present

## 2015-03-04 DIAGNOSIS — E079 Disorder of thyroid, unspecified: Secondary | ICD-10-CM | POA: Diagnosis not present

## 2015-03-04 DIAGNOSIS — Z79899 Other long term (current) drug therapy: Secondary | ICD-10-CM | POA: Insufficient documentation

## 2015-03-04 DIAGNOSIS — R269 Unspecified abnormalities of gait and mobility: Secondary | ICD-10-CM | POA: Diagnosis not present

## 2015-03-04 DIAGNOSIS — F329 Major depressive disorder, single episode, unspecified: Secondary | ICD-10-CM | POA: Diagnosis not present

## 2015-03-04 DIAGNOSIS — E780 Pure hypercholesterolemia: Secondary | ICD-10-CM | POA: Insufficient documentation

## 2015-03-04 DIAGNOSIS — I10 Essential (primary) hypertension: Secondary | ICD-10-CM | POA: Insufficient documentation

## 2015-03-04 DIAGNOSIS — Z880 Allergy status to penicillin: Secondary | ICD-10-CM | POA: Diagnosis not present

## 2015-03-04 DIAGNOSIS — Y998 Other external cause status: Secondary | ICD-10-CM | POA: Insufficient documentation

## 2015-03-04 DIAGNOSIS — Z791 Long term (current) use of non-steroidal anti-inflammatories (NSAID): Secondary | ICD-10-CM | POA: Diagnosis not present

## 2015-03-04 DIAGNOSIS — S8012XA Contusion of left lower leg, initial encounter: Secondary | ICD-10-CM

## 2015-03-04 DIAGNOSIS — H269 Unspecified cataract: Secondary | ICD-10-CM | POA: Insufficient documentation

## 2015-03-04 DIAGNOSIS — Z87891 Personal history of nicotine dependence: Secondary | ICD-10-CM | POA: Diagnosis not present

## 2015-03-04 DIAGNOSIS — W19XXXA Unspecified fall, initial encounter: Secondary | ICD-10-CM

## 2015-03-04 DIAGNOSIS — S8992XA Unspecified injury of left lower leg, initial encounter: Secondary | ICD-10-CM | POA: Diagnosis present

## 2015-03-04 DIAGNOSIS — S8991XA Unspecified injury of right lower leg, initial encounter: Secondary | ICD-10-CM | POA: Diagnosis not present

## 2015-03-04 DIAGNOSIS — W01198A Fall on same level from slipping, tripping and stumbling with subsequent striking against other object, initial encounter: Secondary | ICD-10-CM | POA: Insufficient documentation

## 2015-03-04 LAB — BASIC METABOLIC PANEL
Anion gap: 7 (ref 5–15)
BUN: 15 mg/dL (ref 6–20)
CO2: 30 mmol/L (ref 22–32)
Calcium: 8.5 mg/dL — ABNORMAL LOW (ref 8.9–10.3)
Chloride: 103 mmol/L (ref 101–111)
Creatinine, Ser: 0.73 mg/dL (ref 0.44–1.00)
GFR calc Af Amer: >60 mL/min (ref >60)
GLUCOSE: 95 mg/dL (ref 65–99)
Potassium: 3.2 mmol/L — ABNORMAL LOW (ref 3.5–5.1)
Sodium: 140 mmol/L (ref 135–145)

## 2015-03-04 LAB — CBC WITH DIFFERENTIAL/PLATELET
Basophils Absolute: 0 10*3/uL (ref 0.0–0.1)
Basophils Relative: 0 % (ref 0–1)
Eosinophils Absolute: 0.2 10*3/uL (ref 0.0–0.7)
Eosinophils Relative: 2 % (ref 0–5)
HCT: 38.5 % (ref 36.0–46.0)
Hemoglobin: 12.5 g/dL (ref 12.0–15.0)
LYMPHS ABS: 2.3 10*3/uL (ref 0.7–4.0)
Lymphocytes Relative: 25 % (ref 12–46)
MCH: 30.7 pg (ref 26.0–34.0)
MCHC: 32.5 g/dL (ref 30.0–36.0)
MCV: 94.6 fL (ref 78.0–100.0)
Monocytes Absolute: 0.7 10*3/uL (ref 0.1–1.0)
Monocytes Relative: 8 % (ref 3–12)
NEUTROS PCT: 65 % (ref 43–77)
Neutro Abs: 5.8 10*3/uL (ref 1.7–7.7)
PLATELETS: 201 10*3/uL (ref 150–400)
RBC: 4.07 MIL/uL (ref 3.87–5.11)
RDW: 12.6 % (ref 11.5–15.5)
WBC: 8.9 10*3/uL (ref 4.0–10.5)

## 2015-03-04 MED ORDER — HYDROMORPHONE HCL 1 MG/ML IJ SOLN
1.0000 mg | Freq: Once | INTRAMUSCULAR | Status: AC
  Administered 2015-03-04: 1 mg via INTRAVENOUS
  Filled 2015-03-04: qty 1

## 2015-03-04 MED ORDER — HYDROCODONE-ACETAMINOPHEN 5-325 MG PO TABS: 1.0000 | ORAL_TABLET | ORAL | Status: DC | PRN

## 2015-03-04 NOTE — ED Provider Notes (Signed)
CSN: [id number]     Arrival date & time 03/04/15  1217 History   First MD Initiated Contact with Patient 03/04/15 1228     Chief Complaint  Patient presents with  . Leg Injury     (Consider location/radiation/quality/duration/timing/severity/associated sxs/prior Treatment) HPI Comments: 69 year old female with history of knee replacement, high blood pressure presents with bilateral anterior lower leg pain and swelling with ecchymosis since she fell earlier today trying to put a sharp [place]. Patient was standing on telephone books that slipped out from under her causing her to fall with her tibias hitting the edge of the bathtub. No head injury or loss of consciousness. Patient recalls events. Patient had 200 g a fentanyl on route and pain still severe. Pain with palpation however constant. No numbness or weakness in lower extremities. No blood thinners.  The history is provided by the patient.    Past Medical History  Diagnosis Date  . Thyroid disease   . Depression   . Hypercholesteremia   . Hypertension   . Cataract     Bil   Past Surgical History  Procedure Laterality Date  . Joint replacement  2010    left knee  . Foot surgery  2011    right foot  . Cholecystectomy    . Colonoscopy    . Polypectomy    . Tonsillectomy     Family History  Problem Relation Age of Onset  . Heart disease Mother    History  Substance Use Topics  . Smoking status: Former Smoker    Types: Cigarettes    Quit date: 11/04/2009  . Smokeless tobacco: Never Used  . Alcohol Use: No   OB History    No data available     Review of Systems  Constitutional: Negative for fever and chills.  HENT: Negative for congestion.   Eyes: Negative for visual disturbance.  Respiratory: Negative for shortness of breath.   Cardiovascular: Negative for chest pain.  Gastrointestinal: Negative for vomiting and abdominal pain.  Genitourinary: Negative for dysuria and flank pain.  Musculoskeletal: Positive  for joint swelling and gait problem. Negative for back pain, neck pain and neck stiffness.  Skin: Negative for rash.  Neurological: Negative for light-headedness and headaches.      Allergies  Penicillins and Amoxicillin  Home Medications   Prior to Admission medications   Medication Sig Start Date End Date Taking? Authorizing Provider  ALPRAZolam (XANAX) 0.25 MG tablet Take 0.25 mg by mouth 3 (three) times daily as needed. For anxiety   Yes Historical Provider, MD  Biotin 5000 MCG TABS Take 1 tablet by mouth daily.   Yes Historical Provider, MD  calcium carbonate (OS-CAL - DOSED IN MG OF ELEMENTAL CALCIUM) 1250 MG tablet Take 1 tablet by mouth daily.    Yes Historical Provider, MD  cholecalciferol (VITAMIN D) 1000 UNITS tablet Take 1,000 Units by mouth daily.   Yes Historical Provider, MD  ferrous sulfate 325 (65 FE) MG tablet Take 325 mg by mouth daily with breakfast.   Yes Historical Provider, MD  fish oil-omega-3 fatty acids 1000 MG capsule Take 1 g by mouth 2 (two) times daily.    Yes Historical Provider, MD  gabapentin (NEURONTIN) 300 MG capsule Take 300 mg by mouth at bedtime.   Yes Historical Provider, MD  hydrochlorothiazide (MICROZIDE) 12.5 MG capsule Take 12.5 mg by mouth daily.   Yes Historical Provider, MD  HYDROcodone-acetaminophen (NORCO/VICODIN) 5-325 MG per tablet Take 1 tablet by mouth every 4 (four) hours  as needed for moderate pain.   Yes Historical Provider, MD  levothyroxine (SYNTHROID, LEVOTHROID) 137 MCG tablet Take 137 mcg by mouth daily.   Yes Historical Provider, MD  meloxicam (MOBIC) 7.5 MG tablet Take 7.5 mg by mouth daily.   Yes Historical Provider, MD  Multiple Vitamin (MULTIVITAMIN) tablet Take 1 tablet by mouth daily.   Yes Historical Provider, MD  omeprazole (PRILOSEC) 40 MG capsule Take 40 mg by mouth daily.   Yes Historical Provider, MD  simvastatin (ZOCOR) 40 MG tablet Take 40 mg by mouth every evening.   Yes Historical Provider, MD  traZODone  (DESYREL) 150 MG tablet Take 150 mg by mouth at bedtime.   Yes Historical Provider, MD  venlafaxine XR (EFFEXOR-XR) 150 MG 24 hr capsule Take 150 mg by mouth daily with breakfast.   Yes Historical Provider, MD  venlafaxine XR (EFFEXOR-XR) 75 MG 24 hr capsule Take 75 mg by mouth at bedtime.    Yes Historical Provider, MD  VOLTAREN 1 % GEL Apply 2 g topically 2 (two) times daily as needed (for pain).  10/17/13  Yes Historical Provider, MD  zolpidem (AMBIEN) 10 MG tablet Take 10 mg by mouth at bedtime.   Yes Historical Provider, MD  cephALEXin (KEFLEX) 500 MG capsule Take 1 capsule (500 mg total) by mouth 4 (four) times daily. Take all of medicine and drink lots of fluids Patient not taking: Reported on 03/04/2015 06/04/14   Billy Fischer, MD  doxycycline (VIBRA-TABS) 100 MG tablet Take 1 tablet (100 mg total) by mouth 2 (two) times daily. Patient not taking: Reported on 03/04/2015 02/01/15   Wendie Agreste, MD  fluticasone (CUTIVATE) 0.05 % cream Apply topically 2 (two) times daily. Patient not taking: Reported on 03/04/2015 06/04/14   Billy Fischer, MD  HYDROcodone-acetaminophen [facility]) 5-325 MG per tablet Take 1-2 tablets by mouth every 4 (four) hours as needed. 03/04/15   Elnora Morrison, MD  ondansetron (ZOFRAN ODT) 8 MG disintegrating tablet Take 1 tablet (8 mg total) by mouth every 8 (eight) hours as needed for nausea or vomiting. Patient not taking: Reported on 03/04/2015 02/07/15   Pattricia Boss, MD   BP 136/82 mmHg  Pulse 70  Temp(Src) 98.2 F (36.8 C) (Oral)  Resp 16  SpO2 98% Physical Exam  Constitutional: She is oriented to person, place, and time. She appears well-developed and well-nourished.  HENT:  Head: Normocephalic and atraumatic.  Eyes: Conjunctivae are normal. Right eye exhibits no discharge. Left eye exhibits no discharge.  Neck: Normal range of motion. Neck supple. No tracheal deviation present.  Cardiovascular: Normal rate and regular rhythm.   Pulmonary/Chest: Effort normal and  breath sounds normal.  Abdominal: Soft. She exhibits no distension. There is no tenderness. There is no guarding.  Musculoskeletal: She exhibits tenderness. She exhibits no edema.  Patient has significant tenderness anterior proximal mid tibia bilateral worse on the left. Contusion neck most this anterior tibia worse in the left. Significant hematoma on the left anterior tibia. Compartments soft over tenderness palpation. Neurovascular intact distal legs 2+ pulses DP and PT bilateral. Full range of motion of ankles without effusion. No focal knee tenderness or hip tenderness with palpation or range of motion.  Neurological: She is alert and oriented to person, place, and time.  Skin: Skin is warm. No rash noted.  Psychiatric: She has a normal mood and affect.  Nursing note and vitals reviewed.   ED Course  Procedures (including critical care time) Labs Review Labs Reviewed  BASIC  METABOLIC PANEL - Abnormal; Notable for the following:    Potassium 3.2 (*)    Calcium 8.5 (*)    All other components within normal limits  CBC WITH DIFFERENTIAL/PLATELET    Imaging Review Dg Chest 1 View  03/04/2015   CLINICAL DATA:  Fall in bathroom well shadowing to hanging shower recurrent with left-sided chest pain  EXAM: CHEST  1 VIEW  COMPARISON:  03/27/2014  FINDINGS: The heart size and mediastinal contours are within normal limits. Both lungs are clear. The visualized skeletal structures are unremarkable.  IMPRESSION: No active disease.   Electronically Signed   By: Inez Catalina M.D.   On: 03/04/2015 13:33   Dg Tibia/fibula Left  03/04/2015   CLINICAL DATA:  Fall while putting up shower occurred in today. Bilateral shin hematomas.  EXAM: LEFT TIBIA AND FIBULA - 2 VIEW  COMPARISON:  Knee images 2 02/20/2010  FINDINGS: Changes of left knee replacement again noted. No hardware complicating feature. No joint effusion within the left knee. Marked soft tissue swelling over the anterior upper left tibia. No  underlying bony abnormality. No acute fracture, subluxation or dislocation.  IMPRESSION: No acute bony abnormality.  Anterior soft tissue swelling.   Electronically Signed   By: Rolm Baptise M.D.   On: 03/04/2015 13:28   Dg Tibia/fibula Right  03/04/2015   CLINICAL DATA:  Fall today pitting of shower curtain. Bilateral shin soft tissue swelling.  EXAM: RIGHT TIBIA AND FIBULA - 2 VIEW  COMPARISON:  None.  FINDINGS: Mild anterior soft tissue swelling. No acute bony abnormality. No fracture, subluxation or dislocation. Mild degenerative changes within the right knee.  IMPRESSION: No acute bony abnormality.   Electronically Signed   By: Rolm Baptise M.D.   On: 03/04/2015 13:32   Ct Tibia Fibula Left Wo Contrast  03/04/2015   CLINICAL DATA:  [place] today. Pain and swelling of the left lower extremity.  EXAM: CT TIBIA FIBULA LEFT WITHOUT CONTRAST  TECHNIQUE: Multidetector CT imaging was performed according to the standard protocol. Multiplanar CT image reconstructions were also generated.  COMPARISON:  Radiographs 03/04/2015  FINDINGS: A total knee arthroplasty is noted. No acute fractures identified. The tibia and fibula are intact. The ankle joint is normal.  There is a large pretibial soft tissue hematoma beginning just below the knee and extending down to the lower tibial area.  IMPRESSION: No acute fracture is identified.  Large pretibial subcutaneous hematoma.   Electronically Signed   By: Marijo Sanes M.D.   On: 03/04/2015 14:44   Dg Knee Complete 4 Views Left  03/04/2015   CLINICAL DATA:  Fall in bathtub today. Pain and swelling. Initial encounter.  EXAM: LEFT KNEE - COMPLETE 4+ VIEW  COMPARISON:  Radiographs 11/28/2009.  FINDINGS: Status post total knee arthroplasty. The hardware appears intact without loosening. No evidence of acute fracture, dislocation or joint effusion. Moderate soft tissue swelling is noted anterior to the proximal tibia.  IMPRESSION: No acute osseous findings status post total knee  arthroplasty. Pretibial soft tissue swelling.   Electronically Signed   By: Richardean Sale M.D.   On: 03/04/2015 13:23   Dg Knee Complete 4 Views Right  03/04/2015   CLINICAL DATA:  [place] while putting up shower curtain today. Anterior shin soft tissue swelling bilaterally  EXAM: RIGHT KNEE - COMPLETE 4+ VIEW  COMPARISON:  None.  FINDINGS: Mild degenerative changes within the left right knee. No joint effusion. No fracture, subluxation or dislocation. Soft tissues are intact.  IMPRESSION: No  acute bony abnormality.   Electronically Signed   By: Rolm Baptise M.D.   On: 03/04/2015 13:30     EKG Interpretation None      MDM   Final diagnoses:  Fall  Contusion of left leg, initial encounter   Patient is a mechanical fall and bilateral tibia tenderness. X-rays reviewed no acute fracture by myself. CT scan of the left tibia ordered due to significant swelling and pain. Pain meds and ice.  X-ray reviewed no acute fracture however with significant hematoma and pain CT scan ordered. CT scan results reviewed no acute fracture. Patient's pain improved on recheck. No signs of acute compartment syndrome at this time. Patient has a walker and lives with her husband. Discussed observation in the hospital for rechecks and pain control. Patient does not want to stay in the hospital this time and prefers outpatient follow-up. Patient understands importance of close follow-up and recheck.  Results and differential diagnosis were discussed with the patient/parent/guardian. Close follow up outpatient was discussed, comfortable with the plan.   Medications  HYDROmorphone (DILAUDID) injection 1 mg (1 mg Intravenous Given 03/04/15 1237)  HYDROmorphone (DILAUDID) injection 1 mg (1 mg Intravenous Given 03/04/15 1338)    Filed Vitals:   03/04/15 1328 03/04/15 1330 03/04/15 1528 03/04/15 1610  BP: 143/72 154/71 134/72 136/82  Pulse: 63 66 69 70  Temp:    98.2 F (36.8 C)  TempSrc:    Oral  Resp: 15 14 16 16    SpO2: 95% 95% 97% 98%    Final diagnoses:  Fall  Contusion of left leg, initial encounter       Elnora Morrison, MD 03/04/15 [phone]

## 2015-03-04 NOTE — ED Notes (Signed)
Patient transported to X-ray 

## 2015-03-04 NOTE — ED Notes (Signed)
Patient not in the room.  Nurse is going to start IV when she returns.

## 2015-03-04 NOTE — ED Notes (Signed)
AVS explained in detail. Knows to follow up with orthopedic surgery tomorrow ASAP. Acknowledges understanding. Pedal pulses 1+. Both leg coloration and temperature equal and WDL. Ambulatory to the car. Knows to use ice, Vicodin, Motrin and elevation. No other c/c.

## 2015-03-04 NOTE — ED Notes (Signed)
Per EMS-patient was putting up a shower curtain and was standing on telephone books. Fell against her bathtub with both legs. Gross bruising noted to bilateral lower legs. Deformity noted on left lower leg and knee. Had left knee replacement 5 years ago. Pedal pulses present on bilateral legs. 20 PIV placed in [place]. Given 200 mcg Fentanyl. Pain originally 10/10. Now is 8/10. VSS: 150/70 HR 80.

## 2015-03-04 NOTE — Discharge Instructions (Signed)
Take over the counter medicines for pain, For severe pain take norco or vicodin however realize they have the potential for addiction and it can make you sleepy and has tylenol in it.  No operating machinery while taking. Watch for signs of compartment syndrome (pain out of proportion/ worsening despite pain medicines, numbness in lower leg, cool foot), follow up with ortho doctor for recheck.  Ice for 20 minutes every few hours while awake.  Elevate leg frequently.  If you were given medicines take as directed.  If you are on coumadin or contraceptives realize their levels and effectiveness is altered by many different medicines.  If you have any reaction (rash, tongues swelling, other) to the medicines stop taking and see a physician.    If your blood pressure was elevated in the ER make sure you follow up for management with a primary doctor or return for chest pain, shortness of breath or stroke symptoms.  Please follow up as directed and return to the ER or see a physician for new or worsening symptoms.  Thank you. Filed Vitals:   03/04/15 1327 03/04/15 1328 03/04/15 1330 03/04/15 1528  BP: 143/72 143/72 154/71 134/72  Pulse: 74 63 66 69  Temp:      TempSrc:      Resp: 20 15 14 16   SpO2: 97% 95% 95% 97%

## 2015-03-04 NOTE — ED Notes (Signed)
Bed: WA17 Expected date:  Expected time:  Means of arrival:  Comments: EMS- 69yo F, fall w/ deformity

## 2015-03-04 NOTE — Progress Notes (Signed)
Pt with bilateral knee hematomas no fractures per imaging CM spoke with pt who states she has a walker at home.  CM offered other DME but pt refused States her husband is able to assist at home although he has rotator cuff surgery hx.  Pt refused home health services Cm encouraged pt to contact pcp if she changes her mind and provided pt with a list of [place] Pt understands that her pcp can assist with initiation of home health services if she changes her mind.

## 2015-03-04 NOTE — ED Notes (Signed)
Pt here with deformity/swelling to LLE after falling in bathroom and striking both shins on side of tub. Has large growing hematoma to left shin and large hematoma to right shin. Was able to ambulate to bed after injury. Received total of 250mcg of fentanyl en route to ED, but is in severe pain on arrival.

## 2015-03-25 ENCOUNTER — Encounter: Payer: Self-pay | Admitting: Internal Medicine

## 2015-08-13 ENCOUNTER — Encounter: Payer: Self-pay | Admitting: Internal Medicine

## 2015-09-01 ENCOUNTER — Encounter (HOSPITAL_COMMUNITY): Payer: Self-pay | Admitting: Emergency Medicine

## 2015-09-01 ENCOUNTER — Emergency Department (HOSPITAL_COMMUNITY)
Admission: EM | Admit: 2015-09-01 | Discharge: 2015-09-01 | Disposition: A | Discharge status: Home / Self Care | Payer: Medicare Other | Attending: Emergency Medicine | Admitting: Emergency Medicine

## 2015-09-01 ENCOUNTER — Emergency Department (HOSPITAL_COMMUNITY): Payer: Medicare Other

## 2015-09-01 DIAGNOSIS — Z86718 Personal history of other venous thrombosis and embolism: Secondary | ICD-10-CM | POA: Diagnosis not present

## 2015-09-01 DIAGNOSIS — G8929 Other chronic pain: Secondary | ICD-10-CM | POA: Diagnosis not present

## 2015-09-01 DIAGNOSIS — Z87891 Personal history of nicotine dependence: Secondary | ICD-10-CM | POA: Insufficient documentation

## 2015-09-01 DIAGNOSIS — E7800 Pure hypercholesterolemia, unspecified: Secondary | ICD-10-CM | POA: Insufficient documentation

## 2015-09-01 DIAGNOSIS — Z880 Allergy status to penicillin: Secondary | ICD-10-CM | POA: Insufficient documentation

## 2015-09-01 DIAGNOSIS — I10 Essential (primary) hypertension: Secondary | ICD-10-CM | POA: Insufficient documentation

## 2015-09-01 DIAGNOSIS — R0789 Other chest pain: Secondary | ICD-10-CM

## 2015-09-01 DIAGNOSIS — F329 Major depressive disorder, single episode, unspecified: Secondary | ICD-10-CM | POA: Insufficient documentation

## 2015-09-01 DIAGNOSIS — Z79899 Other long term (current) drug therapy: Secondary | ICD-10-CM | POA: Insufficient documentation

## 2015-09-01 DIAGNOSIS — M25562 Pain in left knee: Secondary | ICD-10-CM | POA: Diagnosis not present

## 2015-09-01 DIAGNOSIS — R0602 Shortness of breath: Secondary | ICD-10-CM | POA: Insufficient documentation

## 2015-09-01 DIAGNOSIS — Z791 Long term (current) use of non-steroidal anti-inflammatories (NSAID): Secondary | ICD-10-CM | POA: Diagnosis not present

## 2015-09-01 DIAGNOSIS — R2243 Localized swelling, mass and lump, lower limb, bilateral: Secondary | ICD-10-CM | POA: Insufficient documentation

## 2015-09-01 DIAGNOSIS — H269 Unspecified cataract: Secondary | ICD-10-CM | POA: Insufficient documentation

## 2015-09-01 DIAGNOSIS — E079 Disorder of thyroid, unspecified: Secondary | ICD-10-CM | POA: Diagnosis not present

## 2015-09-01 DIAGNOSIS — Z9889 Other specified postprocedural states: Secondary | ICD-10-CM | POA: Diagnosis not present

## 2015-09-01 DIAGNOSIS — R079 Chest pain, unspecified: Secondary | ICD-10-CM | POA: Diagnosis present

## 2015-09-01 LAB — CBC
HCT: 42.1 % (ref 36.0–46.0)
Hemoglobin: 13.8 g/dL (ref 12.0–15.0)
MCH: 31.1 pg (ref 26.0–34.0)
MCHC: 32.8 g/dL (ref 30.0–36.0)
MCV: 94.8 fL (ref 78.0–100.0)
PLATELETS: 211 10*3/uL (ref 150–400)
RBC: 4.44 MIL/uL (ref 3.87–5.11)
RDW: 12.8 % (ref 11.5–15.5)
WBC: 7.7 10*3/uL (ref 4.0–10.5)

## 2015-09-01 LAB — BASIC METABOLIC PANEL
Anion gap: 10 (ref 5–15)
BUN: 21 mg/dL — AB (ref 6–20)
CO2: 29 mmol/L (ref 22–32)
Calcium: 9.4 mg/dL (ref 8.9–10.3)
Chloride: 100 mmol/L — ABNORMAL LOW (ref 101–111)
Creatinine, Ser: 0.85 mg/dL (ref 0.44–1.00)
GFR calc Af Amer: >60 mL/min (ref >60)
Glucose, Bld: 95 mg/dL (ref 65–99)
Potassium: 3.7 mmol/L (ref 3.5–5.1)
SODIUM: 139 mmol/L (ref 135–145)

## 2015-09-01 LAB — I-STAT TROPONIN, ED
TROPONIN I, POC: 0.01 ng/mL (ref 0.00–0.08)
Troponin i, poc: 0.01 ng/mL (ref 0.00–0.08)

## 2015-09-01 LAB — BRAIN NATRIURETIC PEPTIDE: B Natriuretic Peptide: 47 pg/mL (ref 0.0–100.0)

## 2015-09-01 LAB — D-DIMER, QUANTITATIVE: D-Dimer, Quant: 0.44 ug/mL-FEU (ref 0.00–0.50)

## 2015-09-01 MED ORDER — HYDROCODONE-ACETAMINOPHEN 5-325 MG PO TABS: 1.0000 | ORAL_TABLET | ORAL | Status: DC | PRN

## 2015-09-01 NOTE — ED Notes (Signed)
Pt c/o central chest pain that has been intermittent over a month.  Pt states that it's related to stress and will just "shoot and go away".  Pt states that she has fallen several times in the past couple weeks, and unsure of what is causing the falls.  Pt also c/o bilat leg pain and swelling.

## 2015-09-01 NOTE — ED Notes (Addendum)
Pt found nurse to state that her "foot was turning black".   RN returned Pt to her room and assessed foot.  Both feet warm, pink and with 2+ pedal pulse.  RN encouraged Pt to please use call bell if she needs to get out of bed to present fall.

## 2015-09-01 NOTE — Discharge Instructions (Signed)
Chronic Pain  Chronic pain can be defined as pain that is off and on and lasts for 3-6 months or longer. Many things cause chronic pain, which can make it difficult to make a diagnosis. There are many treatment options available for chronic pain. However, finding a treatment that works well for you may require trying various approaches until the right one is found. Many people benefit from a combination of two or more types of treatment to control their pain.  SYMPTOMS   Chronic pain can occur anywhere in the body and can range from mild to very severe. Some types of chronic pain include:  · Headache.  · Low back pain.  · Cancer pain.  · Arthritis pain.  · Neurogenic pain. This is pain resulting from damage to nerves.   People with chronic pain may also have other symptoms such as:  · Depression.  · Anger.  · Insomnia.  · Anxiety.  DIAGNOSIS   Your health care provider will help diagnose your condition over time. In many cases, the initial focus will be on excluding possible conditions that could be causing the pain. Depending on your symptoms, your health care provider may order tests to diagnose your condition. Some of these tests may include:   · Blood tests.    · CT scan.    · MRI.    · X-rays.    · Ultrasounds.    · Nerve conduction studies.    You may need to see a specialist.   TREATMENT   Finding treatment that works well may take time. You may be referred to a pain specialist. He or she may prescribe medicine or therapies, such as:   · Mindful meditation or yoga.  · Shots (injections) of numbing or pain-relieving medicines into the spine or area of pain.  · Local electrical stimulation.  · Acupuncture.    · Massage therapy.    · Aroma, color, light, or sound therapy.    · Biofeedback.    · Working with a physical therapist to keep from getting stiff.    · Regular, gentle exercise.    · Cognitive or behavioral therapy.    · Group support.    Sometimes, surgery may be recommended.   HOME CARE INSTRUCTIONS    · Take all medicines as directed by your health care provider.    · Lessen stress in your life by relaxing and doing things such as listening to calming music.    · Exercise or be active as directed by your health care provider.    · Eat a healthy diet and include things such as vegetables, fruits, fish, and lean meats in your diet.    · Keep all follow-up appointments with your health care provider.    · Attend a support group with others suffering from chronic pain.  SEEK MEDICAL CARE IF:   · Your pain gets worse.    · You develop a new pain that was not there before.    · You cannot tolerate medicines given to you by your health care provider.    · You have new symptoms since your last visit with your health care provider.    SEEK IMMEDIATE MEDICAL CARE IF:   · You feel weak.    · You have decreased sensation or numbness.    · You lose control of bowel or bladder function.    · Your pain suddenly gets much worse.    · You develop shaking.  · You develop chills.  · You develop confusion.  · You develop chest pain.  · You develop shortness of breath.    MAKE SURE YOU:  ·   Document Revised: 05/23/2013 Document Reviewed: 03/16/2013 Elsevier Interactive Patient Education 2016 [employer]  Nonspecific Chest Pain  Chest pain can be caused by many different conditions. There is always a chance that your pain could be related to something serious, such as a heart attack or a blood clot in your lungs. Chest pain can also be caused by conditions that are not life-threatening. If you have chest pain, it is very important to follow up with your health care provider. CAUSES  Chest pain  can be caused by:  Heartburn.  Pneumonia or bronchitis.  Anxiety or stress.  Inflammation around your heart (pericarditis) or lung (pleuritis or pleurisy).  A blood clot in your lung.  A collapsed lung (pneumothorax). It can develop suddenly on its own (spontaneous pneumothorax) or from trauma to the chest.  Shingles infection (varicella-zoster virus).  Heart attack.  Damage to the bones, muscles, and cartilage that make up your chest wall. This can include:  Bruised bones due to injury.  Strained muscles or cartilage due to frequent or repeated coughing or overwork.  Fracture to one or more ribs.  Sore cartilage due to inflammation (costochondritis). RISK FACTORS  Risk factors for chest pain may include:  Activities that increase your risk for trauma or injury to your chest.  Respiratory infections or conditions that cause frequent coughing.  Medical conditions or overeating that can cause heartburn.  Heart disease or family history of heart disease.  Conditions or health behaviors that increase your risk of developing a blood clot.  Having had chicken pox (varicella zoster). SIGNS AND SYMPTOMS Chest pain can feel like:  Burning or tingling on the surface of your chest or deep in your chest.  Crushing, pressure, aching, or squeezing pain.  Dull or sharp pain that is worse when you move, cough, or take a deep breath.  Pain that is also felt in your back, neck, shoulder, or arm, or pain that spreads to any of these areas. Your chest pain may come and go, or it may stay constant. DIAGNOSIS Lab tests or other studies may be needed to find the cause of your pain. Your health care provider may have you take a test called an ambulatory ECG (electrocardiogram). An ECG records your heartbeat patterns at the time the test is performed. You may also have other tests, such as:  Transthoracic echocardiogram (TTE). During echocardiography, sound waves are used to create a  picture of all of the heart structures and to look at how blood flows through your heart.  Transesophageal echocardiogram (TEE).This is a more advanced imaging test that obtains images from inside your body. It allows your health care provider to see your heart in finer detail.  Cardiac monitoring. This allows your health care provider to monitor your heart rate and rhythm in real time.  Holter monitor. This is a portable device that records your heartbeat and can help to diagnose abnormal heartbeats. It allows your health care provider to track your heart activity for several days, if needed.  Stress tests. These can be done through exercise or by taking medicine that makes your heart beat more quickly.  Blood tests.  Imaging tests. TREATMENT  Your treatment depends on what is causing your chest pain. Treatment may include:  Medicines. These may include:  Acid blockers for heartburn.  Anti-inflammatory medicine.  Pain medicine for inflammatory conditions.  Antibiotic medicine, if an infection is present.  Medicines to dissolve blood clots.  Medicines to treat coronary artery disease.  Supportive care  for conditions that do not require medicines. This may include:  Resting.  Applying heat or cold packs to injured areas.  Limiting activities until pain decreases. HOME CARE INSTRUCTIONS  If you were prescribed an antibiotic medicine, finish it all even if you start to feel better.  Avoid any activities that bring on chest pain.  Do not use any tobacco products, including cigarettes, chewing tobacco, or electronic cigarettes. If you need help quitting, ask your health care provider.  Do not drink alcohol.  Take medicines only as directed by your health care provider.  Keep all follow-up visits as directed by your health care provider. This is important. This includes any further testing if your chest pain does not go away.  If heartburn is the cause for your chest  pain, you may be told to keep your head raised (elevated) while sleeping. This reduces the chance that acid will go from your stomach into your esophagus.  Make lifestyle changes as directed by your health care provider. These may include:  Getting regular exercise. Ask your health care provider to suggest some activities that are safe for you.  Eating a heart-healthy diet. A registered dietitian can help you to learn healthy eating options.  Maintaining a healthy weight.  Managing diabetes, if necessary.  Reducing stress. SEEK MEDICAL CARE IF:  Your chest pain does not go away after treatment.  You have a rash with blisters on your chest.  You have a fever. SEEK IMMEDIATE MEDICAL CARE IF:   Your chest pain is worse.  You have an increasing cough, or you cough up blood.  You have severe abdominal pain.  You have severe weakness.  You faint.  You have chills.  You have sudden, unexplained chest discomfort.  You have sudden, unexplained discomfort in your arms, back, neck, or jaw.  You have shortness of breath at any time.  You suddenly start to sweat, or your skin gets clammy.  You feel nauseous or you vomit.  You suddenly feel light-headed or dizzy.  Your heart begins to beat quickly, or it feels like it is skipping beats. These symptoms may represent a serious problem that is an emergency. Do not wait to see if the symptoms will go away. Get medical help right away. Call your local emergency services (911 in the [place] Do not drive yourself to the hospital.   This information is not intended to replace advice given to you by your health care provider. Make sure you discuss any questions you have with your health care provider.   Document Released: 06/30/2005 Document Revised: 10/11/2014 Document Reviewed: 04/26/2014 Elsevier Interactive Patient Education [employer].

## 2015-09-01 NOTE — ED Notes (Signed)
Pt took her own IV out and put a band aid from her purse on it.

## 2015-09-01 NOTE — ED Provider Notes (Signed)
CSN: [id number]     Arrival date & time 09/01/15  0802 History   First MD Initiated Contact with Patient 09/01/15 [phone]     Chief Complaint  Patient presents with  . Chest Pain  . Leg Pain    Patient is a 69 y.o. female presenting with chest pain and leg pain. The history is provided by the patient. No language interpreter was used.  Chest Pain Leg Pain  Dawn Kim is a 69 y.o. female w/ hx/o HTN and HPL who presents to the Emergency Department complaining of chest pain and leg pain.she reports several months of waxing and waning chest pain. The pain is located in her central chest and described as a discomfort. It is worse with stress. She reports pain episodes come and go with episodes lasting about a couple of minutes at a time. She has associated shortness of breath. No fevers, cough, hemoptysis, bone pain, vomiting, diarrhea. She also endorses several months of progressive lower extremity swelling. She states it has been present since she had a fall back in May but it is worsening since then. She has diffuse lower extremity pain that is worse with palpation and movement. She reports frequent falls lately. 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 No history of cardiac disease.   Past Medical History  Diagnosis Date  . Thyroid disease   . Depression   . Hypercholesteremia   . Hypertension   . Cataract     Bil   Past Surgical History  Procedure Laterality Date  . Joint replacement  2010    left knee  . Foot surgery  2011    right foot  . Cholecystectomy    . Colonoscopy    . Polypectomy    . Tonsillectomy     Family History  Problem Relation Age of Onset  . Heart disease Mother    Social History  Substance Use Topics  . Smoking status: Former Smoker    Types: Cigarettes    Quit date: 11/04/2009  . Smokeless tobacco: Never Used  . Alcohol Use: No   OB History    No data available     Review of Systems  Cardiovascular:  Positive for chest pain.  All other systems reviewed and are negative.     Allergies  Penicillins and Amoxicillin  Home Medications   Prior to Admission medications   Medication Sig Start Date End Date Taking? Authorizing Provider  ALPRAZolam (XANAX) 0.25 MG tablet Take 0.25 mg by mouth 3 (three) times daily as needed. For anxiety   Yes Historical Provider, MD  Biotin 5000 MCG TABS Take 1 tablet by mouth daily.   Yes Historical Provider, MD  calcium carbonate (OS-CAL - DOSED IN MG OF ELEMENTAL CALCIUM) 1250 MG tablet Take 1 tablet by mouth daily.    Yes Historical Provider, MD  cholecalciferol (VITAMIN D) 1000 UNITS tablet Take 1,000 Units by mouth daily.   Yes Historical Provider, MD  ferrous sulfate 325 (65 FE) MG tablet Take 325 mg by mouth daily with breakfast.   Yes Historical Provider, MD  fish oil-omega-3 fatty acids 1000 MG capsule Take 1 g by mouth 2 (two) times daily.    Yes Historical Provider, MD  gabapentin (NEURONTIN) 300 MG capsule Take 300 mg by mouth at bedtime.   Yes Historical Provider, MD  hydrochlorothiazide (MICROZIDE) 12.5 MG capsule Take 12.5 mg by mouth daily.   Yes Historical Provider, MD  HYDROcodone-acetaminophen (NORCO) 7.5-325 MG tablet  Take 1 tablet by mouth. 08/20/15  Yes Historical Provider, MD  levothyroxine (SYNTHROID, LEVOTHROID) 137 MCG tablet Take 137 mcg by mouth daily.   Yes Historical Provider, MD  meloxicam (MOBIC) 7.5 MG tablet Take 7.5 mg by mouth daily.   Yes Historical Provider, MD  omeprazole (PRILOSEC) 40 MG capsule Take 40 mg by mouth daily.   Yes Historical Provider, MD  simvastatin (ZOCOR) 40 MG tablet Take 40 mg by mouth every evening.   Yes Historical Provider, MD  traZODone (DESYREL) 150 MG tablet Take 150 mg by mouth at bedtime.   Yes Historical Provider, MD  venlafaxine XR (EFFEXOR-XR) 150 MG 24 hr capsule Take 150 mg by mouth daily with breakfast.   Yes Historical Provider, MD  zolpidem (AMBIEN) 10 MG tablet Take 10 mg by mouth at  bedtime.   Yes Historical Provider, MD  cephALEXin (KEFLEX) 500 MG capsule Take 1 capsule (500 mg total) by mouth 4 (four) times daily. Take all of medicine and drink lots of fluids Patient not taking: Reported on 03/04/2015 06/04/14   Billy Fischer, MD  doxycycline (VIBRA-TABS) 100 MG tablet Take 1 tablet (100 mg total) by mouth 2 (two) times daily. Patient not taking: Reported on 03/04/2015 02/01/15   Wendie Agreste, MD  fluticasone (CUTIVATE) 0.05 % cream Apply topically 2 (two) times daily. Patient not taking: Reported on 03/04/2015 06/04/14   Billy Fischer, MD  Multiple Vitamin (MULTIVITAMIN) tablet Take 1 tablet by mouth daily.    Historical Provider, MD  ondansetron (ZOFRAN ODT) 8 MG disintegrating tablet Take 1 tablet (8 mg total) by mouth every 8 (eight) hours as needed for nausea or vomiting. Patient not taking: Reported on 03/04/2015 02/07/15   Pattricia Boss, MD  venlafaxine XR (EFFEXOR-XR) 75 MG 24 hr capsule Take 75 mg by mouth at bedtime.     Historical Provider, MD  VOLTAREN 1 % GEL Apply 2 g topically 2 (two) times daily as needed (for pain).  10/17/13   Historical Provider, MD   BP 173/95 mmHg  Pulse 77  Temp(Src) 97.7 F (36.5 C) (Oral)  Resp 18  SpO2 96% Physical Exam  Constitutional: She is oriented to person, place, and time. She appears well-developed and well-nourished.  HENT:  Head: Normocephalic and atraumatic.  Cardiovascular: Normal rate and regular rhythm.   No murmur heard. Pulmonary/Chest: Effort normal and breath sounds normal. No respiratory distress.  Abdominal: Soft. There is no tenderness. There is no rebound and no guarding.  Musculoskeletal:  Mild diffuse swelling of bilateral lower extremities that is tender to palpation. Swelling extends from feet to knees. 2+ DP pulses bilaterally. No cellulitis.  Neurological: She is alert and oriented to person, place, and time.  Skin: Skin is warm and dry.  Psychiatric: She has a normal mood and affect. Her behavior is  normal.  Nursing note and vitals reviewed.   ED Course  Procedures (including critical care time) Labs Review Labs Reviewed  BASIC METABOLIC PANEL - Abnormal; Notable for the following:    Chloride 100 (*)    BUN 21 (*)    All other components within normal limits  CBC  BRAIN NATRIURETIC PEPTIDE  D-DIMER, QUANTITATIVE (NOT AT [facility])  Randolm Idol, ED  Randolm Idol, ED    Imaging Review Dg Chest 2 View  09/01/2015  CLINICAL DATA:  Central chest pain EXAM: CHEST  2 VIEW COMPARISON:  03/04/2015 FINDINGS: Normal heart size and mediastinal contours. No acute infiltrate or edema. No effusion or pneumothorax. No  acute osseous findings. IMPRESSION: No evidence of active cardiopulmonary disease. Electronically Signed   By: Monte Fantasia M.D.   On: 09/01/2015 09:01   Dg Knee Complete 4 Views Left  09/01/2015  CLINICAL DATA:  Several falls in last couple weeks, BILATERAL leg pain and swelling since Friday, LEFT knee pain EXAM: LEFT KNEE - COMPLETE 4+ VIEW COMPARISON:  03/04/2015 FINDINGS: Osseous demineralization. Components of LEFT knee prosthesis in expected positions. Scattered bone debris. No periprosthetic lucency. Mild regional soft tissue swelling. No acute fracture, dislocation, or bone destruction. No knee joint effusion. IMPRESSION: Osseous demineralization with unchanged appearance of LEFT knee prosthesis since previous exam. No acute abnormalities. Electronically Signed   By: Lavonia Dana M.D.   On: 09/01/2015 13:11   I have personally reviewed and evaluated these images and lab results as part of my medical decision-making.   EKG Interpretation   Date/Time:  Monday September 01 2015 08:12:35 EST Ventricular Rate:  73 PR Interval:  157 QRS Duration: 79 QT Interval:  433 QTC Calculation: 477 R Axis:   58 Text Interpretation:  Sinus rhythm Low voltage, precordial leads  Borderline T abnormalities, anterior leads Baseline wander in lead(s) III  V2 Confirmed by Hazle Coca  [phone]) on 09/01/2015 8:34:03 AM      MDM   Final diagnoses:  Atypical chest pain  Chronic knee pain, left    Patient here for evaluation of several months of chest and leg pain. No evidence of acute CHF, pneumonia, ACS, renal failure. Will risks for DVT/PE, d-dimer negative. Discussed with patient home care for lower extremity edema, leg pain, atypical chest pain also discussed outpatient follow-up. Discussed with patient diet changes. Patient takes hydrocodone at home for pain, meds not given in the emergency Department since patient drove today.    Quintella Reichert, MD 09/01/15 [phone]

## 2015-09-01 NOTE — ED Notes (Signed)
Pt ambulatory with steady gait. C/o bilateral leg and knee pain still. MD Ralene Bathe notified.

## 2015-09-01 NOTE — ED Notes (Signed)
Patient transported to X-ray 

## 2015-09-03 ENCOUNTER — Telehealth (HOSPITAL_BASED_OUTPATIENT_CLINIC_OR_DEPARTMENT_OTHER): Payer: Self-pay | Admitting: Emergency Medicine

## 2015-09-04 ENCOUNTER — Encounter: Payer: Self-pay | Admitting: Internal Medicine

## 2015-09-16 ENCOUNTER — Encounter: Payer: Self-pay | Admitting: Internal Medicine

## 2015-10-03 ENCOUNTER — Ambulatory Visit: Payer: Medicare Other | Admitting: Family Medicine

## 2015-10-12 DIAGNOSIS — T50902A Poisoning by unspecified drugs, medicaments and biological substances, intentional self-harm, initial encounter: Secondary | ICD-10-CM | POA: Insufficient documentation

## 2015-10-13 DIAGNOSIS — X838XXA Intentional self-harm by other specified means, initial encounter: Secondary | ICD-10-CM | POA: Insufficient documentation

## 2015-10-15 DIAGNOSIS — E785 Hyperlipidemia, unspecified: Secondary | ICD-10-CM | POA: Insufficient documentation

## 2015-10-15 DIAGNOSIS — T50901A Poisoning by unspecified drugs, medicaments and biological substances, accidental (unintentional), initial encounter: Secondary | ICD-10-CM | POA: Insufficient documentation

## 2015-10-17 DIAGNOSIS — F332 Major depressive disorder, recurrent severe without psychotic features: Secondary | ICD-10-CM | POA: Insufficient documentation

## 2015-10-22 ENCOUNTER — Encounter: Payer: Self-pay | Admitting: Internal Medicine

## 2015-10-22 ENCOUNTER — Ambulatory Visit (INDEPENDENT_AMBULATORY_CARE_PROVIDER_SITE_OTHER): Payer: Medicare Other | Admitting: Internal Medicine

## 2015-10-22 VITALS — BP 142/90 | HR 75 | Temp 98.5°F | Resp 18 | Ht 62.0 in | Wt 192.0 lb

## 2015-10-22 DIAGNOSIS — I10 Essential (primary) hypertension: Secondary | ICD-10-CM

## 2015-10-22 DIAGNOSIS — M25562 Pain in left knee: Secondary | ICD-10-CM

## 2015-10-22 DIAGNOSIS — G4700 Insomnia, unspecified: Secondary | ICD-10-CM

## 2015-10-22 DIAGNOSIS — E038 Other specified hypothyroidism: Secondary | ICD-10-CM

## 2015-10-22 DIAGNOSIS — E789 Disorder of lipoprotein metabolism, unspecified: Secondary | ICD-10-CM | POA: Diagnosis not present

## 2015-10-22 DIAGNOSIS — E039 Hypothyroidism, unspecified: Secondary | ICD-10-CM | POA: Insufficient documentation

## 2015-10-22 DIAGNOSIS — M25561 Pain in right knee: Secondary | ICD-10-CM

## 2015-10-22 DIAGNOSIS — M79672 Pain in left foot: Secondary | ICD-10-CM

## 2015-10-22 DIAGNOSIS — F32A Depression, unspecified: Secondary | ICD-10-CM

## 2015-10-22 DIAGNOSIS — F419 Anxiety disorder, unspecified: Secondary | ICD-10-CM

## 2015-10-22 DIAGNOSIS — M79671 Pain in right foot: Secondary | ICD-10-CM

## 2015-10-22 DIAGNOSIS — K219 Gastro-esophageal reflux disease without esophagitis: Secondary | ICD-10-CM

## 2015-10-22 DIAGNOSIS — F329 Major depressive disorder, single episode, unspecified: Secondary | ICD-10-CM

## 2015-10-22 MED ORDER — MELOXICAM 15 MG PO TABS: 15.0000 mg | ORAL_TABLET | Freq: Every day | ORAL | Status: DC

## 2015-10-22 MED ORDER — OMEPRAZOLE 40 MG PO CPDR: 40.0000 mg | DELAYED_RELEASE_CAPSULE | Freq: Every day | ORAL | Status: DC

## 2015-10-22 MED ORDER — GABAPENTIN 300 MG PO CAPS: 300.0000 mg | ORAL_CAPSULE | Freq: Two times a day (BID) | ORAL | Status: DC

## 2015-10-22 MED ORDER — SIMVASTATIN 40 MG PO TABS: 40.0000 mg | ORAL_TABLET | Freq: Every evening | ORAL | Status: DC

## 2015-10-22 MED ORDER — CLONAZEPAM 0.5 MG PO TABS: 0.5000 mg | ORAL_TABLET | Freq: Two times a day (BID) | ORAL | Status: DC | PRN

## 2015-10-22 MED ORDER — TRAZODONE HCL 150 MG PO TABS: 150.0000 mg | ORAL_TABLET | Freq: Every day | ORAL | Status: DC

## 2015-10-22 MED ORDER — VENLAFAXINE HCL ER 150 MG PO CP24: 150.0000 mg | ORAL_CAPSULE | Freq: Two times a day (BID) | ORAL | Status: DC

## 2015-10-22 MED ORDER — LEVOTHYROXINE SODIUM 137 MCG PO TABS: 137.0000 ug | ORAL_TABLET | Freq: Every day | ORAL | Status: DC

## 2015-10-22 MED ORDER — RAMIPRIL 10 MG PO CAPS: 10.0000 mg | ORAL_CAPSULE | Freq: Every day | ORAL | Status: DC

## 2015-10-22 NOTE — Assessment & Plan Note (Signed)
Controlled Continue daily medication for now

## 2015-10-22 NOTE — Patient Instructions (Addendum)
   Medications reviewed and updated.  Changes include stopping the hydrocodone pain medication.  We will increase the meloxicam.  Stop taking the lorazepam.  Start clonazepam twice daily.  Stop taking the ambien.   Your prescription(s) have been submitted to your pharmacy. Please take as directed and contact our office if you believe you are having problem(s) with the medication(s).  Call with any questions.  Please schedule followup in 1-2 weeks.

## 2015-10-22 NOTE — Assessment & Plan Note (Signed)
Taking simvastatin 40 mg daily We'll continue We will discuss checking lipid panel at next visit

## 2015-10-22 NOTE — Assessment & Plan Note (Signed)
Taking gabapentin, which seems to be helping Continue gabapentin 300 mg twice daily

## 2015-10-22 NOTE — Progress Notes (Signed)
Subjective:    Patient ID: Dawn Kim, female    DOB: 1945/10/06, 70 y.o.   MRN: [id number]  HPI She is here to establish with a new pcp.   Hypothyroidism:  She is taking her medication daily.  She denies any recent changes in energy or weight that are unexplained.  Her last tsh was about one month ago.  Hypertension: She is taking her medication daily. She is compliant with a low sodium diet.  She denies chest pain, palpitations, edema, shortness of breath and regular headaches. She is not exercising regularly.  She does monitor her blood pressure at home, up to [id number] systolically.    Hyperlipidemia: She is taking her medication daily. She is compliant with a low fat/cholesterol diet. She is not exercising regularly. She denies myalgias.   Depression: She is taking her medication daily as prescribed. She denies any side effects from the medication. She feels her depression is well controlled and she is happy with her current dose of medication.   Anxiety:  She was taking xanax as needed for anxiety, but her last pcp did not want to prescribe it.  She is taking lorazepam as needed, which is an old prescription. She feels her anxiety is not controlled. She feels her stress level is very high. She cannot afford to speak to a counselor because it is not completely covered by her insurance. She is currently taking care for 61 year old husband, which is a huge source of stress.  GERD:  She is taking her medication daily as prescribed.  She denies any GERD symptoms and feels her GERD is well controlled.   Insomnia:  She takes [place] as needed -about twice a week.  She takes trazodone nightly and it works 80% of the time.     S/p left knee replacement, right knee OA:   She takes norco three times as needed for knee pain. She does not take pain medication every single day-just depends on how much pain she has. She follows with orthopedics.  She also takes meloxicam daily.    Foot surgery:  She  had foot surgery and is taking gabapentin twice a day.   Medications and allergies reviewed with patient and updated if appropriate.  Patient Active Problem List   Diagnosis Date Noted  . HTN (hypertension) 01/20/2012  . Lipid disorder 01/20/2012  . Mood disorder ([place]) 01/20/2012    Current Outpatient Prescriptions on File Prior to Visit  Medication Sig Dispense Refill  . ALPRAZolam (XANAX) 0.5 MG tablet Take 0.5 mg by mouth daily as needed for anxiety.    . Biotin 5000 MCG TABS Take 1 tablet by mouth daily.    . calcium carbonate (OS-CAL - DOSED IN MG OF ELEMENTAL CALCIUM) 1250 MG tablet Take 1 tablet by mouth daily.     . cholecalciferol (VITAMIN D) 1000 UNITS tablet Take 1,000 Units by mouth daily.    . fish oil-omega-3 fatty acids 1000 MG capsule Take 1 g by mouth 2 (two) times daily.     [name] gabapentin (NEURONTIN) 300 MG capsule Take 300 mg by mouth 3 (three) times daily.     [name] HYDROcodone-acetaminophen (NORCO) 7.5-325 MG tablet Take 1 tablet by mouth 3 (three) times daily as needed (for pain).   0  . levothyroxine (SYNTHROID, LEVOTHROID) 137 MCG tablet Take 137 mcg by mouth daily.    . meloxicam (MOBIC) 7.5 MG tablet Take 7.5 mg by mouth daily.    . Multiple Vitamin (MULTIVITAMIN) tablet Take  1 tablet by mouth daily.    [name] omeprazole (PRILOSEC) 40 MG capsule Take 40 mg by mouth daily.    . simvastatin (ZOCOR) 40 MG tablet Take 40 mg by mouth every evening.    . traZODone (DESYREL) 150 MG tablet Take 150 mg by mouth at bedtime.    [name] venlafaxine XR (EFFEXOR-XR) 150 MG 24 hr capsule Take 150 mg by mouth daily with breakfast.    . VOLTAREN 1 % GEL Apply 2 g topically 2 (two) times daily as needed (for pain).     [name] zolpidem (AMBIEN) 10 MG tablet Take 10 mg by mouth at bedtime.    . ferrous sulfate 325 (65 FE) MG tablet Take 325 mg by mouth daily with breakfast.     Current Facility-Administered Medications on File Prior to Visit  Medication Dose Route Frequency Provider Last Rate Last  Dose  . triamcinolone acetonide (KENALOG) 10 MG/ML injection 10 mg  10 mg Other Once Harriet Masson, DPM      . triamcinolone acetonide (KENALOG) 10 MG/ML injection 10 mg  10 mg Other Once Harriet Masson, DPM        Past Medical History  Diagnosis Date  . Thyroid disease   . Depression   . Hypercholesteremia   . Hypertension   . Cataract     Bil    Past Surgical History  Procedure Laterality Date  . Joint replacement  2010    left knee  . Foot surgery  2011    right foot  . Cholecystectomy    . Colonoscopy    . Polypectomy    . Tonsillectomy      Social History   Social History  . Marital Status: Married    Spouse Name: N/A  . Number of Children: N/A  . Years of Education: N/A   Social History Main Topics  . Smoking status: Former Smoker    Types: Cigarettes    Quit date: 11/04/2009  . Smokeless tobacco: Never Used  . Alcohol Use: No  . Drug Use: No  . Sexual Activity: Not on file   Other Topics Concern  . Not on file   Social History Narrative    Family History  Problem Relation Age of Onset  . Heart disease Mother     Review of Systems  Constitutional: Negative for fever and chills.  HENT: Negative for congestion and sore throat.   Respiratory: Negative for cough, shortness of breath and wheezing.   Cardiovascular: Negative for chest pain, palpitations and leg swelling.  Gastrointestinal: Positive for constipation (pain medication). Negative for nausea, abdominal pain, diarrhea and blood in stool.       Gerd controlled  Endocrine: Negative for polydipsia and polyuria.  Genitourinary: Negative for dysuria and hematuria.  Musculoskeletal: Positive for arthralgias.  Neurological: Negative for dizziness, light-headedness and headaches.  Psychiatric/Behavioral: Positive for sleep disturbance and dysphoric mood. The patient is nervous/anxious.        Objective:   Filed Vitals:   10/22/15 1307  BP: 142/90  Pulse: 75  Temp: 98.5 F (36.9 C)    Resp: 18   Filed Weights   10/22/15 1307  Weight: 192 lb (87.091 kg)   Body mass index is 35.11 kg/(m^2).   Physical Exam  Constitutional: She is oriented to person, place, and time. She appears well-developed and well-nourished. No distress.  HENT:  Head: Normocephalic and atraumatic.  Nose: Nose normal.  Mouth/Throat: Oropharynx is clear and moist. No oropharyngeal exudate.  Eyes: Conjunctivae are  normal.  Neck: Neck supple. No tracheal deviation present. No thyromegaly present.  No chronic.  Cardiovascular: Normal rate, regular rhythm, normal heart sounds and intact distal pulses.   No murmur heard. Pulmonary/Chest: Effort normal and breath sounds normal. No respiratory distress. She has no wheezes. She has no rales.  Abdominal: Soft. She exhibits no distension. There is no tenderness.  Musculoskeletal: She exhibits no edema.  Lymphadenopathy:    She has no cervical adenopathy.  Neurological: She is alert and oriented to person, place, and time.  Skin: Skin is dry. She is not diaphoretic.  Psychiatric:  Anxious        Assessment & Plan:    See problem list for assessment and plan  Follow-up in 1-2 weeks

## 2015-10-22 NOTE — Assessment & Plan Note (Addendum)
She is taking effexor 150 mg twice daily She feels her depression is well controlled Continue same

## 2015-10-22 NOTE — Assessment & Plan Note (Signed)
Overall not controlled We will increase lisinopril to 10 mg daily Will recheck CMP at next visit

## 2015-10-22 NOTE — Assessment & Plan Note (Signed)
Not controlled, high stress level She really needs to speak with her therapist, but not for this Discussed other sources of social support-church, family members The stress is a huge problem and is affecting many aspects of her life Effexor is not controlling her anxiety She sees a psychiatrist or therapist She has been taking benzodiazepines for a while and I feel the benefit of these medications may help with her case Trial of clonazepam 0.5 mg twice daily

## 2015-10-22 NOTE — Assessment & Plan Note (Signed)
Taking meloxicam 7.5 mg daily-we'll increase to 15 mg daily-we'll reevaluate if this is effective and if that is causing any stomach upset at her next visit Discontinue hydrocodone We'll hopefully be able to avoid narcotic pain medication, but we may need to restart hydrocodone depending on severity of her pain Follow-up in 1-2 weeks Already following with orthopedics

## 2015-10-22 NOTE — Progress Notes (Signed)
Pre visit review using our clinic review tool, if applicable. No additional management support is needed unless otherwise documented below in the visit note. 

## 2015-10-22 NOTE — Assessment & Plan Note (Addendum)
tsh checked about one month ago Taking synthroid 137 mcg daily and wants to try the generic Refill sent to pharmacy We'll discuss blood work at her next visit

## 2015-10-31 ENCOUNTER — Ambulatory Visit: Payer: Medicare Other | Admitting: Internal Medicine

## 2015-11-03 ENCOUNTER — Other Ambulatory Visit: Payer: Self-pay | Admitting: Internal Medicine

## 2015-11-03 ENCOUNTER — Other Ambulatory Visit (INDEPENDENT_AMBULATORY_CARE_PROVIDER_SITE_OTHER): Payer: Medicare Other

## 2015-11-03 ENCOUNTER — Encounter: Payer: Self-pay | Admitting: Internal Medicine

## 2015-11-03 ENCOUNTER — Ambulatory Visit (INDEPENDENT_AMBULATORY_CARE_PROVIDER_SITE_OTHER): Payer: Medicare Other | Admitting: Internal Medicine

## 2015-11-03 VITALS — BP 140/84 | HR 65 | Temp 98.3°F | Resp 18 | Wt 196.0 lb

## 2015-11-03 DIAGNOSIS — G4700 Insomnia, unspecified: Secondary | ICD-10-CM | POA: Diagnosis not present

## 2015-11-03 DIAGNOSIS — E038 Other specified hypothyroidism: Secondary | ICD-10-CM | POA: Diagnosis not present

## 2015-11-03 DIAGNOSIS — F329 Major depressive disorder, single episode, unspecified: Secondary | ICD-10-CM

## 2015-11-03 DIAGNOSIS — F419 Anxiety disorder, unspecified: Secondary | ICD-10-CM | POA: Diagnosis not present

## 2015-11-03 DIAGNOSIS — I10 Essential (primary) hypertension: Secondary | ICD-10-CM

## 2015-11-03 DIAGNOSIS — F32A Depression, unspecified: Secondary | ICD-10-CM

## 2015-11-03 LAB — CBC WITH DIFFERENTIAL/PLATELET
BASOS PCT: 0.3 % (ref 0.0–3.0)
Basophils Absolute: 0 10*3/uL (ref 0.0–0.1)
EOS PCT: 6.1 % — AB (ref 0.0–5.0)
Eosinophils Absolute: 0.4 10*3/uL (ref 0.0–0.7)
HCT: 44.2 % (ref 36.0–46.0)
HEMOGLOBIN: 14.6 g/dL (ref 12.0–15.0)
LYMPHS ABS: 2.2 10*3/uL (ref 0.7–4.0)
Lymphocytes Relative: 30.5 % (ref 12.0–46.0)
MCHC: 33.1 g/dL (ref 30.0–36.0)
MCV: 91.8 fl (ref 78.0–100.0)
MONO ABS: 0.6 10*3/uL (ref 0.1–1.0)
MONOS PCT: 8.7 % (ref 3.0–12.0)
Neutro Abs: 3.9 10*3/uL (ref 1.4–7.7)
Neutrophils Relative %: 54.4 % (ref 43.0–77.0)
Platelets: 269 10*3/uL (ref 150.0–400.0)
RBC: 4.82 Mil/uL (ref 3.87–5.11)
RDW: 12.7 % (ref 11.5–15.5)
WBC: 7.2 10*3/uL (ref 4.0–10.5)

## 2015-11-03 LAB — COMPREHENSIVE METABOLIC PANEL
ALT: 15 U/L (ref 0–35)
AST: 15 U/L (ref 0–37)
Albumin: 4 g/dL (ref 3.5–5.2)
Alkaline Phosphatase: 48 U/L (ref 39–117)
BUN: 14 mg/dL (ref 6–23)
CHLORIDE: 104 meq/L (ref 96–112)
CO2: 30 mEq/L (ref 19–32)
Calcium: 8.9 mg/dL (ref 8.4–10.5)
Creatinine, Ser: 0.8 mg/dL (ref 0.40–1.20)
GFR: 75.06 mL/min (ref >60.00)
GLUCOSE: 86 mg/dL (ref 70–99)
POTASSIUM: 4.2 meq/L (ref 3.5–5.1)
Sodium: 142 mEq/L (ref 135–145)
Total Bilirubin: 0.5 mg/dL (ref 0.2–1.2)
Total Protein: 6.6 g/dL (ref 6.0–8.3)

## 2015-11-03 LAB — TSH: TSH: 0.32 u[IU]/mL — AB (ref 0.35–4.50)

## 2015-11-03 MED ORDER — TRAZODONE HCL 100 MG PO TABS: 200.0000 mg | ORAL_TABLET | Freq: Every day | ORAL | Status: DC

## 2015-11-03 MED ORDER — BUSPIRONE HCL 5 MG PO TABS: 5.0000 mg | ORAL_TABLET | Freq: Three times a day (TID) | ORAL | Status: DC

## 2015-11-03 MED ORDER — LEVOTHYROXINE SODIUM 125 MCG PO TABS: 125.0000 ug | ORAL_TABLET | Freq: Every day | ORAL | Status: DC

## 2015-11-03 NOTE — Progress Notes (Signed)
Subjective:    Patient ID: Dawn Kim, female    DOB: June 27, 1946, 70 y.o.   MRN: [id number]  HPI She is here for follow-up. Her niece is with her.  Insomnia:  She is taking the trazodone nightly and 150 mg nightly. She has been on medication for years and when she saw me last she was also taking Ambien with that.  The trazodone 150 mg is not helping. She is not able to go to sleep and once she does fall asleep she is not able to stay asleep. She is getting approximately 8-year-old 4 hours of sleep a night. She does feel chronically fatigued.  .  Memory issues:  She is concerned about memory. She sometimes has difficulty recalling specific name or object. We did discuss at her last visit that some of the medication she was on could potentially affect her memory and she is concerned that she took them for so long.    Anxiety, stress:  The clonazepam made her cry and feel more stressed.  She stopped it.  She took xanax in the past and that did help with her anxiety - she took it 1-2 times a day and helped.  The ativan did not help and it made her sleepy/groggy.  She feels anxiety intermittently throughout the day, not consistently. She was on BuSpar in the past as well. This was only during one situation and she denies any side effects.    Depression:  She feels her depression is controlled. She is taking Effexor twice daily as prescribed. She is happy with her doses and does not want to change this medication.  Hypothyroidism:  She is taking her medication daily.  She denies any recent changes in energy or weight that are unexplained.   Hypertension: She is taking her medication daily. She is compliant with a low sodium diet.  She denies chest pain, palpitations, shortness of breath and regular headaches. She is not exercising regularly.  She does monitor her blood pressure at home, 143/85, 124/?.  Her blood pressure has been better since she was here last one we adjusted her  medication.   Medications and allergies reviewed with patient and updated if appropriate.  Patient Active Problem List   Diagnosis Date Noted  . Hypothyroidism 10/22/2015  . Depression 10/22/2015  . Anxiety 10/22/2015  . GERD (gastroesophageal reflux disease) 10/22/2015  . Insomnia 10/22/2015  . Foot pain, right 10/22/2015  . Bilateral knee pain 10/22/2015  . HTN (hypertension) 01/20/2012  . Lipid disorder 01/20/2012    Current Outpatient Prescriptions on File Prior to Visit  Medication Sig Dispense Refill  . Biotin 5000 MCG TABS Take 1 tablet by mouth daily.    . calcium carbonate (OS-CAL - DOSED IN MG OF ELEMENTAL CALCIUM) 1250 MG tablet Take 1 tablet by mouth daily.     . cholecalciferol (VITAMIN D) 1000 UNITS tablet Take 1,000 Units by mouth daily.    . ferrous sulfate 325 (65 FE) MG tablet Take 325 mg by mouth daily with breakfast.    . fish oil-omega-3 fatty acids 1000 MG capsule Take 1 g by mouth 2 (two) times daily.     [name] gabapentin (NEURONTIN) 300 MG capsule Take 1 capsule (300 mg total) by mouth 2 (two) times daily. 180 capsule 3  . levothyroxine (SYNTHROID, LEVOTHROID) 137 MCG tablet Take 1 tablet (137 mcg total) by mouth daily. 90 tablet 3  . meloxicam (MOBIC) 15 MG tablet Take 1 tablet (15 mg total) by  mouth daily. 90 tablet 3  . Multiple Vitamin (MULTIVITAMIN) tablet Take 1 tablet by mouth daily.    [name] omeprazole (PRILOSEC) 40 MG capsule Take 1 capsule (40 mg total) by mouth daily. 90 capsule 3  . ramipril (ALTACE) 10 MG capsule Take 1 capsule (10 mg total) by mouth daily. 90 capsule 3  . simvastatin (ZOCOR) 40 MG tablet Take 1 tablet (40 mg total) by mouth every evening. 90 tablet 3  . traZODone (DESYREL) 150 MG tablet Take 1 tablet (150 mg total) by mouth at bedtime. 90 tablet 3  . venlafaxine XR (EFFEXOR-XR) 150 MG 24 hr capsule Take 1 capsule (150 mg total) by mouth 2 (two) times daily. 180 capsule 3  . VOLTAREN 1 % GEL Apply 2 g topically 2 (two) times daily as  needed (for pain).     . clonazePAM (KLONOPIN) 0.5 MG tablet Take 1 tablet (0.5 mg total) by mouth 2 (two) times daily as needed for anxiety. (Patient not taking: Reported on 11/03/2015) 60 tablet 0   Current Facility-Administered Medications on File Prior to Visit  Medication Dose Route Frequency Provider Last Rate Last Dose  . triamcinolone acetonide (KENALOG) 10 MG/ML injection 10 mg  10 mg Other Once Harriet Masson, DPM      . triamcinolone acetonide (KENALOG) 10 MG/ML injection 10 mg  10 mg Other Once Harriet Masson, DPM        Past Medical History  Diagnosis Date  . Thyroid disease   . Depression   . Hypercholesteremia   . Hypertension   . Cataract     Bil    Past Surgical History  Procedure Laterality Date  . Joint replacement  2010    left knee  . Foot surgery  2011    right foot  . Cholecystectomy    . Colonoscopy    . Polypectomy    . Tonsillectomy      Social History   Social History  . Marital Status: Married    Spouse Name: N/A  . Number of Children: N/A  . Years of Education: N/A   Social History Main Topics  . Smoking status: Former Smoker    Types: Cigarettes    Quit date: 11/04/2009  . Smokeless tobacco: Never Used  . Alcohol Use: No  . Drug Use: No  . Sexual Activity: Not Asked   Other Topics Concern  . None   Social History Narrative    Family History  Problem Relation Age of Onset  . Heart disease Mother     Review of Systems  Constitutional: Negative for fever and chills.  Respiratory: Negative for cough, shortness of breath and wheezing.   Cardiovascular: Positive for leg swelling (occasional). Negative for chest pain and palpitations.  Neurological: Positive for headaches (sometimes). Negative for dizziness and light-headedness.       Objective:   Filed Vitals:   11/03/15 1105  BP: 140/84  Pulse: 65  Temp: 98.3 F (36.8 C)  Resp: 18   Filed Weights   11/03/15 1105  Weight: 196 lb (88.905 kg)   Body mass index is  35.84 kg/(m^2).   Physical Exam Constitutional: Appears well-developed and well-nourished. No distress.  Neck: Neck supple. No tracheal deviation present. No thyromegaly present.  No carotid bruit. No cervical adenopathy.   Cardiovascular: Normal rate, regular rhythm and normal heart sounds.   No murmur heard.  No edema Pulmonary/Chest: Effort normal and breath sounds normal. No respiratory distress. No wheezes.  Psych:  Appears anxious  Assessment & Plan:   See Problem List for Assessment and Plan of chronic medical problems.  Follow-up in one month, sooner if needed

## 2015-11-03 NOTE — Assessment & Plan Note (Signed)
Not controlled and she appears anxious here today Review her options I would like to avoid benzodiazepine and she does not want to change the Effexor She did tolerate BuSpar in the past and we will retry that at a low dose Start buspar 5 mg three times daily She would benefit from therapy, but this is not covered by her insurance Consider psychiatry for evaluation, but she also thinks this is not covered for the pain-she will check Follow-up in one month

## 2015-11-03 NOTE — Assessment & Plan Note (Signed)
Check TSH We'll titrate medication if needed Need to rule this out as a possible source of some of her anxiety

## 2015-11-03 NOTE — Progress Notes (Signed)
Pre visit review using our clinic review tool, if applicable. No additional management support is needed unless otherwise documented below in the visit note. 

## 2015-11-03 NOTE — Assessment & Plan Note (Signed)
Blood pressure better controlled at home after increasing ramipril to 10 mg She will continue to monitor at home Encouraged regular exercise Recheck in one month

## 2015-11-03 NOTE — Assessment & Plan Note (Signed)
Controlled, stable Continue Effexor at current dose

## 2015-11-03 NOTE — Patient Instructions (Addendum)
  We have reviewed your prior records including labs and tests today.  Test(s) ordered today. Your results will be released to [facility] (or called to you) after review, usually within 72hours after test completion. If any changes need to be made, you will be notified at that same time.   Medications reviewed and updated.     Changes include increasing the trazodone to 200 mg nightly.  We will also start buspirone (buspar) 5 mg three times a day.    Your prescription(s) have been submitted to your pharmacy. Please take as directed and contact our office if you believe you are having problem(s) with the medication(s).   Please schedule followup in 4 weeks

## 2015-11-03 NOTE — Assessment & Plan Note (Signed)
Not controlled with trazodone 150 mg nightly We will try the trazodone at 200 mg nightly - hopefully this will increase will help Her niece and I agree she is not acutely be on Ambien and we will try to avoid this Discussed good sleep hygiene Hopefully decreasing her anxiety will also help Follow-up in one month

## 2015-11-05 ENCOUNTER — Encounter: Payer: Medicare Other | Admitting: Internal Medicine

## 2015-11-12 DIAGNOSIS — H5203 Hypermetropia, bilateral: Secondary | ICD-10-CM | POA: Diagnosis not present

## 2015-11-18 ENCOUNTER — Encounter: Payer: Medicare Other | Admitting: Internal Medicine

## 2015-11-19 ENCOUNTER — Telehealth: Payer: Self-pay | Admitting: Internal Medicine

## 2015-11-19 NOTE — Telephone Encounter (Signed)
Please advise if there is anything you can do until Dr Quay Burow gets back.

## 2015-11-19 NOTE — Telephone Encounter (Signed)
Pt called stating Dr. Quay Burow changed up her traZODone (DESYREL) 100 MG tablet [id number] to help her sleep She still isn't able to sleep. She states she's been on this for 18yrs and thinks she may be immune to it.  She takes care of her 72 yr old husband and needs her sleep. I informed her Dr. Quay Burow is out for the week and she would like to see if you can consult with another provider. [facility]

## 2015-11-20 NOTE — Telephone Encounter (Signed)
Will defer additional medication until Dr. Quay Burow returns.

## 2015-11-21 NOTE — Telephone Encounter (Signed)
Spoke with pt to inform.  

## 2015-12-01 ENCOUNTER — Ambulatory Visit: Payer: Medicare Other | Admitting: Internal Medicine

## 2015-12-15 ENCOUNTER — Ambulatory Visit (INDEPENDENT_AMBULATORY_CARE_PROVIDER_SITE_OTHER): Payer: Medicare Other | Admitting: Internal Medicine

## 2015-12-15 ENCOUNTER — Encounter: Payer: Self-pay | Admitting: Internal Medicine

## 2015-12-15 VITALS — BP 140/88 | HR 74 | Temp 98.4°F | Resp 16 | Wt 197.0 lb

## 2015-12-15 DIAGNOSIS — F329 Major depressive disorder, single episode, unspecified: Secondary | ICD-10-CM | POA: Diagnosis not present

## 2015-12-15 DIAGNOSIS — G4700 Insomnia, unspecified: Secondary | ICD-10-CM | POA: Diagnosis not present

## 2015-12-15 DIAGNOSIS — F419 Anxiety disorder, unspecified: Secondary | ICD-10-CM | POA: Diagnosis not present

## 2015-12-15 DIAGNOSIS — M25562 Pain in left knee: Secondary | ICD-10-CM

## 2015-12-15 DIAGNOSIS — M25561 Pain in right knee: Secondary | ICD-10-CM

## 2015-12-15 DIAGNOSIS — F32A Depression, unspecified: Secondary | ICD-10-CM

## 2015-12-15 MED ORDER — BUSPIRONE HCL 5 MG PO TABS: 10.0000 mg | ORAL_TABLET | Freq: Three times a day (TID) | ORAL | Status: DC

## 2015-12-15 MED ORDER — SUVOREXANT 10 MG PO TABS: 10.0000 mg | ORAL_TABLET | Freq: Every day | ORAL | Status: DC

## 2015-12-15 MED ORDER — GABAPENTIN 300 MG PO CAPS: ORAL_CAPSULE | ORAL | Status: DC

## 2015-12-15 NOTE — Assessment & Plan Note (Signed)
Trazodone not effective-we'll discontinue Ideally like to avoid Ambien given her side effect- staggering and feeling under the influence the morning after taking it Trial of belsomra If above not improved may need to discontinue Effexor and try Remeron or some other alternative-doxepin. Ideally she should be seen a psychiatrist for medication adjustment, but it is not covered by insurance and she cannot afford Hopefully the slight increase in gabapentin will help - we may be able to increase this further

## 2015-12-15 NOTE — Progress Notes (Signed)
Pre visit review using our clinic review tool, if applicable. No additional management support is needed unless otherwise documented below in the visit note. 

## 2015-12-15 NOTE — Assessment & Plan Note (Signed)
Continue meloxicam-we'll reevaluate when her anxiety and depression are better controlled-may need to discontinue if it is not effective at all Increase gabapentin to 600 mg at bedtime, continue 300 mg in the morning

## 2015-12-15 NOTE — Patient Instructions (Addendum)
Try increasing buspar to 10 mg three times a day.  If this does not help with your anxiety call me Thursday or Friday.  We may need to go back to the alprazolam.   Continue the trazodone for now - we will send a new prescription to your pharmacy to see if we can get it approved.    Increase your gabapentin to one pill in the morning and two pills at bedtime.

## 2015-12-15 NOTE — Assessment & Plan Note (Signed)
Not really controlled Taking Effexor 300 mg daily-which is a very high dose Discussed with her decreasing the dose of changing to a different medication and she does not want to try that I think she would do better with a different medication, but we will readdress this once her anxiety and insomnia are slightly improved

## 2015-12-15 NOTE — Assessment & Plan Note (Signed)
Poorly controlled. Ativan, clonazepam were not effective were made her anxiety worse Xanax was effective, but not ideal medication for. She has been on BuSpar and there has been no effect. I will increase the BuSpar to 10 mg 3 times daily. She will call me in a few days if this is not effective. If it is not effective and we'll go ahead and taper her off the medication and consider restarting Xanax. Unfortunately her anxiety is severe and she needs something. Discussed changing the Effexor to something different, but she does not want to do that-remained considerably longer better control her overall anxiety depression

## 2015-12-15 NOTE — Progress Notes (Signed)
Subjective:    Patient ID: Dawn Kim, female    DOB: Aug 12, 1946, 70 y.o.   MRN: [id number]  HPI She is here for follow up of her anxiety, depression, insomnia.   Insomnia:  She has tried trazodone 200 mg nightly.  She had been taking 150 mg nightly and it was not effective and we decided to try 200 mg at night. She states this has not helped at all. She falls asleep ok and then wakes 3-4 hours after falling asleep.  She is unable to get back to sleep.  She was on Ambien in the past, but admits that sometimes she would feel hung over in the morning. One morning she found herself staggering going to an early doctor's appointment.  Anxiety:  She is taking the effexor daily.  She has tried clonazepam and lorazepam and they did not work or made things worse. She is on buspar now and it has not helped.  The only medication that has helped is xanax.    Depression: She is taking the Effexor daily as prescribed. She has been on that dose for a while and is very hesitant to decrease it. At times she feels her depression is well controlled, but with being so fatigued and anxious she is starting to become depressed.  Left knee pain:  She has chronic left knee pain. She has had 4 surgeries on that knee in the past. She has seen more than one orthopedic and the only thing they could do was an injection.  She is frustrated that there is no way to control her pain. She does take the meloxicam daily and it does not help.  She takes the gabapentin one morning and 1 at night and she thinks it might help with some for pain.  Medications and allergies reviewed with patient and updated if appropriate.  Patient Active Problem List   Diagnosis Date Noted  . Hypothyroidism 10/22/2015  . Depression 10/22/2015  . Anxiety 10/22/2015  . GERD (gastroesophageal reflux disease) 10/22/2015  . Insomnia 10/22/2015  . Foot pain, right 10/22/2015  . Bilateral knee pain 10/22/2015  . HTN (hypertension) 01/20/2012  .  Lipid disorder 01/20/2012    Current Outpatient Prescriptions on File Prior to Visit  Medication Sig Dispense Refill  . Biotin 5000 MCG TABS Take 1 tablet by mouth daily.    . busPIRone (BUSPAR) 5 MG tablet Take 1 tablet (5 mg total) by mouth 3 (three) times daily. 90 tablet 1  . calcium carbonate (OS-CAL - DOSED IN MG OF ELEMENTAL CALCIUM) 1250 MG tablet Take 1 tablet by mouth daily.     . cholecalciferol (VITAMIN D) 1000 UNITS tablet Take 1,000 Units by mouth daily.    . ferrous sulfate 325 (65 FE) MG tablet Take 325 mg by mouth daily with breakfast.    . fish oil-omega-3 fatty acids 1000 MG capsule Take 1 g by mouth 2 (two) times daily.     [name] gabapentin (NEURONTIN) 300 MG capsule Take 1 capsule (300 mg total) by mouth 2 (two) times daily. 180 capsule 3  . levothyroxine (SYNTHROID, LEVOTHROID) 125 MCG tablet Take 1 tablet (125 mcg total) by mouth daily. 90 tablet 3  . meloxicam (MOBIC) 15 MG tablet Take 1 tablet (15 mg total) by mouth daily. 90 tablet 3  . Multiple Vitamin (MULTIVITAMIN) tablet Take 1 tablet by mouth daily.    [name] omeprazole (PRILOSEC) 40 MG capsule Take 1 capsule (40 mg total) by mouth daily. 90 capsule  3  . ramipril (ALTACE) 10 MG capsule Take 1 capsule (10 mg total) by mouth daily. 90 capsule 3  . simvastatin (ZOCOR) 40 MG tablet Take 1 tablet (40 mg total) by mouth every evening. 90 tablet 3  . traZODone (DESYREL) 100 MG tablet Take 2 tablets (200 mg total) by mouth at bedtime. 60 tablet 3  . venlafaxine XR (EFFEXOR-XR) 150 MG 24 hr capsule Take 1 capsule (150 mg total) by mouth 2 (two) times daily. 180 capsule 3  . VOLTAREN 1 % GEL Apply 2 g topically 2 (two) times daily as needed (for pain).      Current Facility-Administered Medications on File Prior to Visit  Medication Dose Route Frequency Provider Last Rate Last Dose  . triamcinolone acetonide (KENALOG) 10 MG/ML injection 10 mg  10 mg Other Once Harriet Masson, DPM      . triamcinolone acetonide (KENALOG) 10 MG/ML  injection 10 mg  10 mg Other Once Harriet Masson, DPM        Past Medical History  Diagnosis Date  . Thyroid disease   . Depression   . Hypercholesteremia   . Hypertension   . Cataract     Bil    Past Surgical History  Procedure Laterality Date  . Joint replacement  2010    left knee  . Foot surgery  2011    right foot  . Cholecystectomy    . Colonoscopy    . Polypectomy    . Tonsillectomy      Social History   Social History  . Marital Status: Married    Spouse Name: N/A  . Number of Children: N/A  . Years of Education: N/A   Social History Main Topics  . Smoking status: Former Smoker    Types: Cigarettes    Quit date: 11/04/2009  . Smokeless tobacco: Never Used  . Alcohol Use: No  . Drug Use: No  . Sexual Activity: Not on file   Other Topics Concern  . Not on file   Social History Narrative    Family History  Problem Relation Age of Onset  . Heart disease Mother     Review of Systems  Constitutional: Positive for fever.  Psychiatric/Behavioral: Positive for sleep disturbance and dysphoric mood. Negative for self-injury. The patient is nervous/anxious.        Objective:   Filed Vitals:   12/15/15 1602  BP: 140/88  Pulse: 74  Temp: 98.4 F (36.9 C)  Resp: 16   Filed Weights   12/15/15 1602  Weight: 197 lb (89.359 kg)   Body mass index is 36.02 kg/(m^2).   Physical Exam  Constitutional: She appears well-developed and well-nourished. No distress.  Psychiatric:  Anxious and depressed, crying intermittently       Assessment & Plan:   30 minutes were spent face-to-face with the patient, over 50% of which was spent counseling regarding her medications, trying to control her anxiety, depression, insomnia and pain. She is unable to afford to see a therapist or psychiatrist and neither are covered by her insurance  See Problem List for Assessment and Plan of chronic medical problems.  Follow-up in 4 weeks, sooner if needed

## 2015-12-16 ENCOUNTER — Telehealth: Payer: Self-pay | Admitting: Internal Medicine

## 2015-12-16 NOTE — Telephone Encounter (Signed)
Pt states the pharmacy told her they will not cover the Suvorexant (BELSOMRA) 10 MG TABS [id number]

## 2015-12-16 NOTE — Telephone Encounter (Signed)
This med will require a PA, LVM informing pt. MD has been notified and does want to proceed with PA.

## 2015-12-17 MED ORDER — ALPRAZOLAM 0.25 MG PO TABS: 0.2500 mg | ORAL_TABLET | Freq: Three times a day (TID) | ORAL | Status: DC

## 2015-12-17 NOTE — Telephone Encounter (Signed)
Stop buspar.  Start xanax 0.25 mg three times daily.  Lets work on the anxiety first then worry about the sleep so we do not make too many changes.

## 2015-12-17 NOTE — Telephone Encounter (Signed)
Please advise 

## 2015-12-17 NOTE — Addendum Note (Signed)
Addended by: Binnie Rail on: 12/17/2015 03:29 PM   Modules accepted: Orders, Medications

## 2015-12-17 NOTE — Telephone Encounter (Addendum)
Pt informed rx has been faxed to pharm.

## 2015-12-17 NOTE — Telephone Encounter (Signed)
Pt called back and she did call the insurance and they said they would approve it but it would cost her about $150.  She's wondering if there could be an alternative.  She also states the busPIRone (BUSPAR) 5 MG tablet [id number] is not helping her at all.  Please give pt a call

## 2015-12-22 ENCOUNTER — Telehealth: Payer: Self-pay | Admitting: Internal Medicine

## 2015-12-22 MED ORDER — QUETIAPINE FUMARATE 25 MG PO TABS: 25.0000 mg | ORAL_TABLET | Freq: Every day | ORAL | Status: DC

## 2015-12-22 NOTE — Telephone Encounter (Signed)
Pt states she hasn't been sleeping at all and if you can prescribe something to help her  Pharmacy is [facility]

## 2015-12-22 NOTE — Telephone Encounter (Signed)
Trial of seroquel -- sent to pharmacy.  She should follow up with me next week in the office

## 2015-12-22 NOTE — Telephone Encounter (Signed)
Please advise 

## 2015-12-23 ENCOUNTER — Telehealth: Payer: Self-pay

## 2015-12-23 DIAGNOSIS — G4700 Insomnia, unspecified: Secondary | ICD-10-CM

## 2015-12-23 NOTE — Telephone Encounter (Signed)
PA initiated via [facility]Meds [facility]

## 2015-12-23 NOTE — Telephone Encounter (Signed)
Pt is going to make sure the co-pay for the med is affordable and will call back to schedule an appt.

## 2015-12-24 NOTE — Telephone Encounter (Signed)
Re-faxed to [phone]

## 2015-12-24 NOTE — Telephone Encounter (Signed)
[employer] called [phone]) requesting additional information ( does pt have additional diagnosis beside insomnia ex. Depression, anxiety etc.)  Information submitted via fax to [phone] Authorization Dept

## 2015-12-25 NOTE — Telephone Encounter (Signed)
PA Denied, pt requires phychiatric diagnosis to qualify for medication (Anxiety, depression not included - mood disorder is listed as resolved)

## 2015-12-26 NOTE — Telephone Encounter (Signed)
Patient called to advise that she was told that the PA was denied. Advised that we have note of this, and that it has been sent to dr burns for review.

## 2015-12-28 NOTE — Telephone Encounter (Signed)
She can try increasing the gabapentin to three pills at night.  Has she tried [place] or [place] - they are similar to [place]? There are no other options - she has either tried everything or can not take certain medications because of what she is already on.  I do not think the effexor is working - she is still anxious.  I think we need to decrease that dose slowly and consider different treatment that may help her anxiety, depression and possibly sleep.

## 2015-12-30 NOTE — Telephone Encounter (Signed)
Hetlioz is restricted in the [place].  Armodafinil is used in narcolepsy to help keep people awake during the day - it is not appropriate.  We can have her see neuro sleep medicine clinic - we can see if they can help.

## 2015-12-30 NOTE — Telephone Encounter (Signed)
Spoke with pt. She stated that medications listed below were not in the "book". She gave two medications that were Armodafinil & Hetlioz. Pt is coming in next week for follow-up

## 2015-12-31 ENCOUNTER — Encounter: Payer: Self-pay | Admitting: Internal Medicine

## 2015-12-31 ENCOUNTER — Other Ambulatory Visit: Payer: Self-pay | Admitting: Internal Medicine

## 2015-12-31 NOTE — Telephone Encounter (Signed)
silenor can not be combined with the effexor.  xyrem is not available in the [place] and not appropriate.   zaleplon is sonata, which I asked previously if she has tried.  It may not be covered by her insurance and we would have to be careful with her other medicatons

## 2015-12-31 NOTE — Telephone Encounter (Signed)
Please advise 

## 2015-12-31 NOTE — Telephone Encounter (Signed)
Pt has called back and I let her know what Dr. Quay Burow said. She gave me more names from the "book". Silenor 3 mg, Xyren, Zaleplon 5mg . I also mentioned the sleep clinic.  Can you please give her a call back

## 2015-12-31 NOTE — Telephone Encounter (Signed)
Spoke with pt. She is okay with trying to [facility], informed that we would send it in to verify if insurance would pay.

## 2015-12-31 NOTE — Telephone Encounter (Signed)
LVM for pt to call back.

## 2016-01-01 ENCOUNTER — Telehealth: Payer: Self-pay | Admitting: Emergency Medicine

## 2016-01-01 MED ORDER — ZALEPLON 5 MG PO CAPS: 5.0000 mg | ORAL_CAPSULE | Freq: Every evening | ORAL | Status: DC | PRN

## 2016-01-01 NOTE — Addendum Note (Signed)
Addended by: Binnie Rail on: 01/01/2016 11:56 AM   Modules accepted: Orders

## 2016-01-01 NOTE — Telephone Encounter (Signed)
RX for [employer] faxed to [facility]

## 2016-01-01 NOTE — Telephone Encounter (Signed)
Faxed to [facility]

## 2016-01-01 NOTE — Telephone Encounter (Signed)
printed

## 2016-01-05 ENCOUNTER — Telehealth: Payer: Self-pay | Admitting: Internal Medicine

## 2016-01-05 ENCOUNTER — Encounter: Payer: Medicare Other | Admitting: Internal Medicine

## 2016-01-05 NOTE — Telephone Encounter (Signed)
It is not for a sleep study over night - it is to see a neurologist

## 2016-01-05 NOTE — Telephone Encounter (Signed)
Pt agree to take 2 pill at night to see if that will help. Pt does not wants to go to sleep clinic, can not leave spouse at home by himself. Pt has an appt on 01/08/16.

## 2016-01-05 NOTE — Progress Notes (Signed)
Subjective:    Patient ID: Dawn Kim, female    DOB: Apr 17, 1946, 70 y.o.   MRN: [id number]  HPI error  Medications and allergies reviewed with patient and updated if appropriate.  Patient Active Problem List   Diagnosis Date Noted  . Hypothyroidism 10/22/2015  . Depression 10/22/2015  . Anxiety 10/22/2015  . GERD (gastroesophageal reflux disease) 10/22/2015  . Insomnia 10/22/2015  . Foot pain, right 10/22/2015  . Bilateral knee pain 10/22/2015  . HTN (hypertension) 01/20/2012  . Lipid disorder 01/20/2012    Current Outpatient Prescriptions on File Prior to Visit  Medication Sig Dispense Refill  . ALPRAZolam (XANAX) 0.25 MG tablet Take 1 tablet (0.25 mg total) by mouth 3 (three) times daily. 90 tablet 0  . Biotin 5000 MCG TABS Take 1 tablet by mouth daily.    . calcium carbonate (OS-CAL - DOSED IN MG OF ELEMENTAL CALCIUM) 1250 MG tablet Take 1 tablet by mouth daily.     . cholecalciferol (VITAMIN D) 1000 UNITS tablet Take 1,000 Units by mouth daily.    . ferrous sulfate 325 (65 FE) MG tablet Take 325 mg by mouth daily with breakfast.    . fish oil-omega-3 fatty acids 1000 MG capsule Take 1 g by mouth 2 (two) times daily.     [name] gabapentin (NEURONTIN) 300 MG capsule Take one pill in morning, take two pills at bedtime 270 capsule 0  . levothyroxine (SYNTHROID, LEVOTHROID) 125 MCG tablet Take 1 tablet (125 mcg total) by mouth daily. 90 tablet 3  . meloxicam (MOBIC) 15 MG tablet Take 1 tablet (15 mg total) by mouth daily. 90 tablet 3  . Multiple Vitamin (MULTIVITAMIN) tablet Take 1 tablet by mouth daily.    [name] omeprazole (PRILOSEC) 40 MG capsule Take 1 capsule (40 mg total) by mouth daily. 90 capsule 3  . QUEtiapine (SEROQUEL) 25 MG tablet Take 1 tablet (25 mg total) by mouth at bedtime. 30 tablet 0  . ramipril (ALTACE) 10 MG capsule Take 1 capsule (10 mg total) by mouth daily. 90 capsule 3  . simvastatin (ZOCOR) 40 MG tablet Take 1 tablet (40 mg total) by mouth every evening.  90 tablet 3  . Suvorexant (BELSOMRA) 10 MG TABS Take 10 mg by mouth at bedtime. 30 tablet 5  . venlafaxine XR (EFFEXOR-XR) 150 MG 24 hr capsule Take 1 capsule (150 mg total) by mouth 2 (two) times daily. 180 capsule 3  . VOLTAREN 1 % GEL Apply 2 g topically 2 (two) times daily as needed (for pain).     . zaleplon (SONATA) 5 MG capsule Take 1 capsule (5 mg total) by mouth at bedtime as needed for sleep. 30 capsule 0   Current Facility-Administered Medications on File Prior to Visit  Medication Dose Route Frequency Provider Last Rate Last Dose  . triamcinolone acetonide (KENALOG) 10 MG/ML injection 10 mg  10 mg Other Once Harriet Masson, DPM      . triamcinolone acetonide (KENALOG) 10 MG/ML injection 10 mg  10 mg Other Once Harriet Masson, DPM        Past Medical History  Diagnosis Date  . Thyroid disease   . Depression   . Hypercholesteremia   . Hypertension   . Cataract     Bil    Past Surgical History  Procedure Laterality Date  . Joint replacement  2010    left knee  . Foot surgery  2011    right foot  . Cholecystectomy    .  Colonoscopy    . Polypectomy    . Tonsillectomy      Social History   Social History  . Marital Status: Married    Spouse Name: N/A  . Number of Children: N/A  . Years of Education: N/A   Social History Main Topics  . Smoking status: Former Smoker    Types: Cigarettes    Quit date: 11/04/2009  . Smokeless tobacco: Never Used  . Alcohol Use: No  . Drug Use: No  . Sexual Activity: Not on file   Other Topics Concern  . Not on file   Social History Narrative    Family History  Problem Relation Age of Onset  . Heart disease Mother     Review of Systems     Objective:  There were no vitals filed for this visit. There were no vitals filed for this visit. There is no weight on file to calculate BMI.   Physical Exam        Assessment & Plan:    This encounter was created in error - please disregard.

## 2016-01-05 NOTE — Telephone Encounter (Signed)
Pt called stating her new sleep medication is not working. She picked it up on Friday and has not slept. She has canceled her appt for this morning for this reason.

## 2016-01-05 NOTE — Telephone Encounter (Signed)
She can try taking two at night to see if that works.  I would like to refer her to a sleep clinic if she agrees.

## 2016-01-06 NOTE — Telephone Encounter (Signed)
Spoke with pt to inform. Pt stated that she was not able to go to any other appts right now and wanted to discuss further at appt later in the week. Stated she took 2 pills last night for sleep and was able to sleep.

## 2016-01-08 ENCOUNTER — Ambulatory Visit (INDEPENDENT_AMBULATORY_CARE_PROVIDER_SITE_OTHER): Payer: Medicare Other | Admitting: Internal Medicine

## 2016-01-08 ENCOUNTER — Encounter: Payer: Self-pay | Admitting: Internal Medicine

## 2016-01-08 VITALS — BP 146/100 | HR 70 | Temp 98.5°F | Resp 16 | Wt 190.0 lb

## 2016-01-08 DIAGNOSIS — G4700 Insomnia, unspecified: Secondary | ICD-10-CM

## 2016-01-08 DIAGNOSIS — F419 Anxiety disorder, unspecified: Secondary | ICD-10-CM

## 2016-01-08 DIAGNOSIS — F329 Major depressive disorder, single episode, unspecified: Secondary | ICD-10-CM | POA: Diagnosis not present

## 2016-01-08 DIAGNOSIS — F32A Depression, unspecified: Secondary | ICD-10-CM

## 2016-01-08 MED ORDER — ZALEPLON 10 MG PO CAPS: 10.0000 mg | ORAL_CAPSULE | Freq: Every evening | ORAL | Status: DC | PRN

## 2016-01-08 NOTE — Assessment & Plan Note (Signed)
Sonata not effective at 5 mg, but seems to be effective and well tolerated and 10 mg nightly Continue Sonata 10 mg nightly Given her extensive history with sleep difficulties and will refer to neurology-sleep clinic for further evaluation and recommendations

## 2016-01-08 NOTE — Progress Notes (Signed)
Pre visit review using our clinic review tool, if applicable. No additional management support is needed unless otherwise documented below in the visit note. 

## 2016-01-08 NOTE — Assessment & Plan Note (Signed)
Fairly controlled. She does experience some depression regarding her husband. She is resistant to changing the Effexor, which she has been on for a long time Ideally I would like her to see a psychiatrist for medication management, but this is not covered by her insurance and she cannot afford Continue current medication for now, but we'll discuss decreasing Effexor and considering a different medication in the near future

## 2016-01-08 NOTE — Assessment & Plan Note (Signed)
Anxiety not controlled with Effexor BuSpar, Ativan and clonazepam not effective Xanax is effective and given her significant anxiety we will continue We have discussed the possible risks with taking medication on a daily basis

## 2016-01-08 NOTE — Patient Instructions (Signed)
   Medications reviewed and updated.  No changes recommended at this time.  Continue the sonata at 10 mg nightly for sleep.  We will continue the xanax as needed during the day for anxiety - do not take more than prescribed.      Please followup in 3 months

## 2016-01-08 NOTE — Progress Notes (Signed)
Subjective:    Patient ID: Dawn Kim, female    DOB: 04-19-1946, 70 y.o.   MRN: [id number]  HPI She is here for follow up of anxiety and insomnia.  Anxiety:  She is taking the xanax during the day.  She was prescribed one pill three times a day, but did self increase the medication and has run out early.  She was taking it at night to help her sleep as well.  She does feel less anxious during the day since getting some sleep.  She is out of xanax, but understands she can not get it filled yet.    Insomnia: She has tried several medication that have not been effective.  Ambien was effective, but she felt hungover the next day.  She has been using sonata at 5 mg and it did not help.  She tried the 10 mg and it seems to work well.  She denies a hungover feeling.    Depression:  She is taking effexor daily as prescribed.  She has been on medication for a long time and has felt that it has been effective. She is reluctant to make any changes.  We have discussed in the past or seen a psychiatrist or a therapist, but neither by her insurance and she is not able to afford it.  Medications and allergies reviewed with patient and updated if appropriate.  Patient Active Problem List   Diagnosis Date Noted  . Hypothyroidism 10/22/2015  . Depression 10/22/2015  . Anxiety 10/22/2015  . GERD (gastroesophageal reflux disease) 10/22/2015  . Insomnia 10/22/2015  . Foot pain, right 10/22/2015  . Bilateral knee pain 10/22/2015  . HTN (hypertension) 01/20/2012  . Lipid disorder 01/20/2012    Current Outpatient Prescriptions on File Prior to Visit  Medication Sig Dispense Refill  . ALPRAZolam (XANAX) 0.25 MG tablet Take 1 tablet (0.25 mg total) by mouth 3 (three) times daily. 90 tablet 0  . Biotin 5000 MCG TABS Take 1 tablet by mouth daily.    . calcium carbonate (OS-CAL - DOSED IN MG OF ELEMENTAL CALCIUM) 1250 MG tablet Take 1 tablet by mouth daily.     . cholecalciferol (VITAMIN D) 1000 UNITS  tablet Take 1,000 Units by mouth daily.    . ferrous sulfate 325 (65 FE) MG tablet Take 325 mg by mouth daily with breakfast.    . fish oil-omega-3 fatty acids 1000 MG capsule Take 1 g by mouth 2 (two) times daily.     [name] gabapentin (NEURONTIN) 300 MG capsule Take one pill in morning, take two pills at bedtime 270 capsule 0  . levothyroxine (SYNTHROID, LEVOTHROID) 125 MCG tablet Take 1 tablet (125 mcg total) by mouth daily. 90 tablet 3  . meloxicam (MOBIC) 15 MG tablet Take 1 tablet (15 mg total) by mouth daily. 90 tablet 3  . Multiple Vitamin (MULTIVITAMIN) tablet Take 1 tablet by mouth daily.    [name] omeprazole (PRILOSEC) 40 MG capsule Take 1 capsule (40 mg total) by mouth daily. 90 capsule 3  . ramipril (ALTACE) 10 MG capsule Take 1 capsule (10 mg total) by mouth daily. 90 capsule 3  . simvastatin (ZOCOR) 40 MG tablet Take 1 tablet (40 mg total) by mouth every evening. 90 tablet 3  . venlafaxine XR (EFFEXOR-XR) 150 MG 24 hr capsule Take 1 capsule (150 mg total) by mouth 2 (two) times daily. 180 capsule 3  . VOLTAREN 1 % GEL Apply 2 g topically 2 (two) times daily as needed (  for pain).     . zaleplon (SONATA) 5 MG capsule Take 1 capsule (5 mg total) by mouth at bedtime as needed for sleep. 30 capsule 0   Current Facility-Administered Medications on File Prior to Visit  Medication Dose Route Frequency Provider Last Rate Last Dose  . triamcinolone acetonide (KENALOG) 10 MG/ML injection 10 mg  10 mg Other Once Harriet Masson, DPM      . triamcinolone acetonide (KENALOG) 10 MG/ML injection 10 mg  10 mg Other Once Harriet Masson, DPM        Past Medical History  Diagnosis Date  . Thyroid disease   . Depression   . Hypercholesteremia   . Hypertension   . Cataract     Bil    Past Surgical History  Procedure Laterality Date  . Joint replacement  2010    left knee  . Foot surgery  2011    right foot  . Cholecystectomy    . Colonoscopy    . Polypectomy    . Tonsillectomy      Social  History   Social History  . Marital Status: Married    Spouse Name: N/A  . Number of Children: N/A  . Years of Education: N/A   Social History Main Topics  . Smoking status: Former Smoker    Types: Cigarettes    Quit date: 11/04/2009  . Smokeless tobacco: Never Used  . Alcohol Use: No  . Drug Use: No  . Sexual Activity: Not on file   Other Topics Concern  . Not on file   Social History Narrative    Family History  Problem Relation Age of Onset  . Heart disease Mother     Review of Systems  Constitutional: Positive for fatigue (Energy level is improving with increased sleep).  Cardiovascular: Negative for chest pain and palpitations.  Neurological: Negative for light-headedness and headaches.  Psychiatric/Behavioral: Positive for sleep disturbance and dysphoric mood (sometimes feels depressed). Negative for confusion and self-injury. The patient is nervous/anxious.        Objective:   Filed Vitals:   01/08/16 0928  BP: 146/100  Pulse: 70  Temp: 98.5 F (36.9 C)  Resp: 16   Filed Weights   01/08/16 0928  Weight: 190 lb (86.183 kg)   Body mass index is 34.74 kg/(m^2).   Physical Exam  Constitutional: She appears well-developed and well-nourished. No distress.  Skin: She is not diaphoretic.  Psychiatric: Judgment and thought content normal.  Slightly anxious, almost hyperactive          Assessment & Plan:   See Problem List for Assessment and Plan of chronic medical problems.

## 2016-01-09 ENCOUNTER — Other Ambulatory Visit: Payer: Self-pay | Admitting: Internal Medicine

## 2016-01-12 ENCOUNTER — Other Ambulatory Visit: Payer: Self-pay | Admitting: Internal Medicine

## 2016-01-12 NOTE — Telephone Encounter (Signed)
RX [facility]ed to local [facility] 

## 2016-01-19 ENCOUNTER — Other Ambulatory Visit: Payer: Self-pay | Admitting: Internal Medicine

## 2016-01-19 DIAGNOSIS — G4700 Insomnia, unspecified: Secondary | ICD-10-CM

## 2016-01-28 ENCOUNTER — Telehealth: Payer: Self-pay | Admitting: Internal Medicine

## 2016-01-28 NOTE — Telephone Encounter (Signed)
Please advise 

## 2016-01-28 NOTE — Telephone Encounter (Signed)
States medication prescribed for knee inflammation - mobic.  Would like to know if something else could be sent to her pharmacy at [facility] on [address]. Please follow up in regards.

## 2016-01-29 MED ORDER — DICLOFENAC SODIUM 75 MG PO TBEC: 75.0000 mg | DELAYED_RELEASE_TABLET | Freq: Two times a day (BID) | ORAL | Status: DC

## 2016-01-29 NOTE — Telephone Encounter (Signed)
Spoke with pt to inform.  

## 2016-01-29 NOTE — Telephone Encounter (Signed)
A different anti-inflammatory was sent to her [facility] - diclofenac - she needs to stop the meloxicam.  This one may or may not be more effective.

## 2016-02-09 ENCOUNTER — Encounter: Payer: Self-pay | Admitting: Internal Medicine

## 2016-02-12 DIAGNOSIS — M1711 Unilateral primary osteoarthritis, right knee: Secondary | ICD-10-CM | POA: Diagnosis not present

## 2016-02-12 DIAGNOSIS — Z96652 Presence of left artificial knee joint: Secondary | ICD-10-CM | POA: Diagnosis not present

## 2016-02-12 DIAGNOSIS — Z09 Encounter for follow-up examination after completed treatment for conditions other than malignant neoplasm: Secondary | ICD-10-CM | POA: Diagnosis not present

## 2016-02-16 ENCOUNTER — Other Ambulatory Visit: Payer: Self-pay | Admitting: Internal Medicine

## 2016-02-16 ENCOUNTER — Encounter: Payer: Self-pay | Admitting: Internal Medicine

## 2016-02-16 NOTE — Telephone Encounter (Signed)
RX faxed to [facility] 

## 2016-02-17 ENCOUNTER — Other Ambulatory Visit (INDEPENDENT_AMBULATORY_CARE_PROVIDER_SITE_OTHER): Payer: Medicare Other

## 2016-02-17 ENCOUNTER — Encounter: Payer: Self-pay | Admitting: Internal Medicine

## 2016-02-17 ENCOUNTER — Ambulatory Visit (INDEPENDENT_AMBULATORY_CARE_PROVIDER_SITE_OTHER): Payer: Medicare Other | Admitting: Internal Medicine

## 2016-02-17 VITALS — BP 160/100 | HR 82 | Temp 98.4°F | Resp 18 | Ht 62.5 in | Wt 193.1 lb

## 2016-02-17 DIAGNOSIS — Z1159 Encounter for screening for other viral diseases: Secondary | ICD-10-CM | POA: Diagnosis not present

## 2016-02-17 DIAGNOSIS — F329 Major depressive disorder, single episode, unspecified: Secondary | ICD-10-CM

## 2016-02-17 DIAGNOSIS — I10 Essential (primary) hypertension: Secondary | ICD-10-CM | POA: Diagnosis not present

## 2016-02-17 DIAGNOSIS — F419 Anxiety disorder, unspecified: Secondary | ICD-10-CM | POA: Diagnosis not present

## 2016-02-17 DIAGNOSIS — M25561 Pain in right knee: Secondary | ICD-10-CM

## 2016-02-17 DIAGNOSIS — E038 Other specified hypothyroidism: Secondary | ICD-10-CM

## 2016-02-17 DIAGNOSIS — K219 Gastro-esophageal reflux disease without esophagitis: Secondary | ICD-10-CM

## 2016-02-17 DIAGNOSIS — M25562 Pain in left knee: Secondary | ICD-10-CM

## 2016-02-17 DIAGNOSIS — F32A Depression, unspecified: Secondary | ICD-10-CM

## 2016-02-17 DIAGNOSIS — G4700 Insomnia, unspecified: Secondary | ICD-10-CM | POA: Diagnosis not present

## 2016-02-17 LAB — TSH: TSH: 0.48 u[IU]/mL (ref 0.35–4.50)

## 2016-02-17 MED ORDER — TRAZODONE HCL 100 MG PO TABS: 150.0000 mg | ORAL_TABLET | Freq: Every day | ORAL | Status: DC

## 2016-02-17 MED ORDER — HYDROCHLOROTHIAZIDE 25 MG PO TABS: 25.0000 mg | ORAL_TABLET | Freq: Every day | ORAL | Status: DC

## 2016-02-17 NOTE — Progress Notes (Signed)
Subjective:    Patient ID: Dawn Kim, female    DOB: 08-08-1946, 70 y.o.   MRN: [id number]  HPI She is here for follow up.  Anxiety, depression: She feels her depression and anxiety are controlled.  She is happy with her current dose of effexor and xanax.  She still has anxiety and depression at times.   Insomnia:  She has been taking the sonata 10 mg at night and has had confusion, gait instability and memory issues.  The 5 mg dose was not effective.  She has tried several other medications and had side effects or they were not effective. She wonders about going back to trazodone.    Hypertension: She is taking her medication daily. She is compliant with a low sodium diet.  She denies chest pain, palpitations, edema, shortness of breath and regular headaches. She is not exercising regularly.  She does monitor her blood pressure at home - it has been 169/92 and around there.    GERD:  She is taking her medication daily as prescribed.  She denies any GERD symptoms and feels her GERD is well controlled.   Hypothyroidism:  She is taking her medication daily.  She denies any recent changes in energy or weight that are unexplained.   Arthritis:  She has bad knee pain and is following with orthopedics.  She had an injection in her right knee.  She has been taking the diclofenac and it helps a little.     Medications and allergies reviewed with patient and updated if appropriate.  Patient Active Problem List   Diagnosis Date Noted  . Hypothyroidism 10/22/2015  . Depression 10/22/2015  . Anxiety 10/22/2015  . GERD (gastroesophageal reflux disease) 10/22/2015  . Insomnia 10/22/2015  . Foot pain, right 10/22/2015  . Bilateral knee pain 10/22/2015  . HTN (hypertension) 01/20/2012  . Lipid disorder 01/20/2012    Current Outpatient Prescriptions on File Prior to Visit  Medication Sig Dispense Refill  . ALPRAZolam (XANAX) 0.25 MG tablet TAKE ONE TABLET BY MOUTH THREE TIMES DAILY 90  tablet 0  . Biotin 5000 MCG TABS Take 1 tablet by mouth daily.    . calcium carbonate (OS-CAL - DOSED IN MG OF ELEMENTAL CALCIUM) 1250 MG tablet Take 1 tablet by mouth daily.     . cholecalciferol (VITAMIN D) 1000 UNITS tablet Take 1,000 Units by mouth daily.    . diclofenac (VOLTAREN) 75 MG EC tablet Take 1 tablet (75 mg total) by mouth 2 (two) times daily. Take with food. 60 tablet 3  . ferrous sulfate 325 (65 FE) MG tablet Take 325 mg by mouth daily with breakfast.    . fish oil-omega-3 fatty acids 1000 MG capsule Take 1 g by mouth 2 (two) times daily.     [name] gabapentin (NEURONTIN) 300 MG capsule Take one pill in morning, take two pills at bedtime 270 capsule 0  . levothyroxine (SYNTHROID, LEVOTHROID) 125 MCG tablet Take 1 tablet (125 mcg total) by mouth daily. 90 tablet 3  . Multiple Vitamin (MULTIVITAMIN) tablet Take 1 tablet by mouth daily.    [name] omeprazole (PRILOSEC) 40 MG capsule Take 1 capsule (40 mg total) by mouth daily. 90 capsule 3  . ramipril (ALTACE) 10 MG capsule Take 1 capsule (10 mg total) by mouth daily. 90 capsule 3  . simvastatin (ZOCOR) 40 MG tablet Take 1 tablet (40 mg total) by mouth every evening. 90 tablet 3  . venlafaxine XR (EFFEXOR-XR) 150 MG 24 hr capsule  Take 1 capsule (150 mg total) by mouth 2 (two) times daily. 180 capsule 3  . zaleplon (SONATA) 10 MG capsule Take 1 capsule (10 mg total) by mouth at bedtime as needed for sleep. 30 capsule 2  . VOLTAREN 1 % GEL Apply 2 g topically 2 (two) times daily as needed (for pain). Reported on 02/17/2016     Current Facility-Administered Medications on File Prior to Visit  Medication Dose Route Frequency Provider Last Rate Last Dose  . triamcinolone acetonide (KENALOG) 10 MG/ML injection 10 mg  10 mg Other Once Harriet Masson, DPM      . triamcinolone acetonide (KENALOG) 10 MG/ML injection 10 mg  10 mg Other Once Harriet Masson, DPM        Past Medical History  Diagnosis Date  . Thyroid disease   . Depression   .  Hypercholesteremia   . Hypertension   . Cataract     Bil    Past Surgical History  Procedure Laterality Date  . Joint replacement  2010    left knee  . Foot surgery  2011    right foot  . Cholecystectomy    . Colonoscopy    . Polypectomy    . Tonsillectomy      Social History   Social History  . Marital Status: Married    Spouse Name: N/A  . Number of Children: N/A  . Years of Education: N/A   Social History Main Topics  . Smoking status: Former Smoker    Types: Cigarettes    Quit date: 11/04/2009  . Smokeless tobacco: Never Used  . Alcohol Use: No  . Drug Use: No  . Sexual Activity: Not Asked   Other Topics Concern  . None   Social History Narrative    Family History  Problem Relation Age of Onset  . Heart disease Mother     Review of Systems  Constitutional: Negative for fever.  Respiratory: Negative for cough, shortness of breath and wheezing.   Cardiovascular: Negative for chest pain, palpitations and leg swelling.  Musculoskeletal: Positive for joint swelling (knees) and arthralgias.  Neurological: Negative for light-headedness and headaches.       Objective:   Filed Vitals:   02/17/16 1601  BP: 160/100  Pulse: 82  Temp: 98.4 F (36.9 C)  Resp: 18   Filed Weights   02/17/16 1601  Weight: 193 lb 1.9 oz (87.599 kg)   Body mass index is 34.74 kg/(m^2).   Physical Exam Constitutional: Appears well-developed and well-nourished. No distress.  Neck: Neck supple. No tracheal deviation present. No thyromegaly present.  No carotid bruit. No cervical adenopathy.   Cardiovascular: Normal rate, regular rhythm and normal heart sounds.   No murmur heard.  No edema Pulmonary/Chest: Effort normal and breath sounds normal. No respiratory distress. No wheezes.  Psych: slightly anxious - at baseline      Assessment & Plan:   See Problem List for Assessment and Plan of chronic medical problems.  F/u in 4 months

## 2016-02-17 NOTE — Assessment & Plan Note (Signed)
Fairly controlled Will continue current dose of effexor

## 2016-02-17 NOTE — Progress Notes (Signed)
Pre visit review using our clinic review tool, if applicable. No additional management support is needed unless otherwise documented below in the visit note. 

## 2016-02-17 NOTE — Assessment & Plan Note (Signed)
GERD controlled Continue daily medication  

## 2016-02-17 NOTE — Assessment & Plan Note (Signed)
Check tsh  Titrate med dose if needed  

## 2016-02-17 NOTE — Patient Instructions (Addendum)
Goal BP on average less than 150/90.   Test(s) ordered today. Your results will be released to [place] (or called to you) after review, usually within 72hours after test completion. If any changes need to be made, you will be notified at that same time.  Medications reviewed and updated.  Changes include stopping the sonata (sleep medication) and restarting the trazodone.  Take 150-200 mg nightly.  Start hydrochlorothiazide for your blood pressure - 25 mg daily.  Your prescription(s) have been submitted to your pharmacy. Please take as directed and contact our office if you believe you are having problem(s) with the medication(s).   Please followup in 4 months

## 2016-02-17 NOTE — Assessment & Plan Note (Signed)
Fairly controlled Will continue current dose of effexor and xanax

## 2016-02-17 NOTE — Assessment & Plan Note (Signed)
Following with orthopedics Diclofenac helps a little - will continue Encouraged regular exercise, weight loss

## 2016-02-17 NOTE — Assessment & Plan Note (Signed)
Side effects from sonata 10 mg daily -  will discontinue  Restart trazodone 150-200 mg daily

## 2016-02-17 NOTE — Assessment & Plan Note (Signed)
Uncontrolled Continue altace 10 mg daily Start hctz 25 mg daily She will continue to monitor her BP at home - discussed goal of less than 150/90

## 2016-02-18 LAB — HEPATITIS C ANTIBODY: HCV AB: NEGATIVE

## 2016-02-23 ENCOUNTER — Other Ambulatory Visit: Payer: Self-pay | Admitting: Obstetrics & Gynecology

## 2016-02-23 ENCOUNTER — Telehealth: Payer: Self-pay | Admitting: Internal Medicine

## 2016-02-23 DIAGNOSIS — R5381 Other malaise: Secondary | ICD-10-CM

## 2016-02-23 MED ORDER — DICLOFENAC SODIUM 1 % TD GEL: 2.0000 g | Freq: Two times a day (BID) | TRANSDERMAL | Status: DC | PRN

## 2016-02-23 NOTE — Telephone Encounter (Signed)
Is requesting voltarin gel to be sent to [facility] on [address].   Patient is requesting script for 3 tubes b/c its the same price as 1.

## 2016-02-24 ENCOUNTER — Telehealth: Payer: Self-pay | Admitting: Internal Medicine

## 2016-02-24 ENCOUNTER — Other Ambulatory Visit: Payer: Self-pay | Admitting: Obstetrics & Gynecology

## 2016-02-24 DIAGNOSIS — E2839 Other primary ovarian failure: Secondary | ICD-10-CM

## 2016-02-24 NOTE — Telephone Encounter (Signed)
See other note

## 2016-02-24 NOTE — Telephone Encounter (Signed)
Is the orthopedic trying to rule out insufficiency of the veins or a blood clot?  I will order - just want to know more of why the orthopedic wants this test.

## 2016-02-24 NOTE — Telephone Encounter (Signed)
Patient is requesting a HC US VENOUS IMAGING BILATERAL . i advised that an appt may be needed for this, and she states that she did talk to you about this at the last visit that she had. She states that she prefers a referral from you because of the fact that she is not comfortable with the judgement of her orthopedic doctor.

## 2016-02-24 NOTE — Telephone Encounter (Signed)
5.23.17 Pt called at 5:30 because she had not heard from [facility] or Dr. Quay Burow regarding phone not from earlier in the morning. Told pt I would let [name] know to call first thing in the AM. MS

## 2016-02-25 DIAGNOSIS — M7742 Metatarsalgia, left foot: Secondary | ICD-10-CM | POA: Diagnosis not present

## 2016-02-25 DIAGNOSIS — M7741 Metatarsalgia, right foot: Secondary | ICD-10-CM | POA: Diagnosis not present

## 2016-02-25 NOTE — Telephone Encounter (Signed)
Patient walked in today. She states that she is the one that is requesting the test. Dawn Kim spoke to the patient and advised that we are without the notes from her ortho dr. The dx is left knee pain

## 2016-02-25 NOTE — Telephone Encounter (Signed)
It might be best for her to see a different orthopedic for evaluation.  She does not need and can not have a bone scan.  An [place] is only going to show if she has a blood clot, but will not show anything else.  If she thinks she has a blood clot she needs to come in for an appt so we can get the US done.

## 2016-02-25 NOTE — Telephone Encounter (Signed)
LVM for pt to call back. MD is needing to know why ortho is wanting the the [place] ordered.

## 2016-02-25 NOTE — Telephone Encounter (Signed)
Pt states that she would like a Bone Scan. It sounds like she needs an [place] be she continued to talk about blood clots and bruising. She stated ortho would not given injection in left knee due to a replacement. She states left knee is causing horrible pain.

## 2016-02-26 NOTE — Telephone Encounter (Signed)
Patient called back in regards.  Gave her Dr. Quay Burow response.  Patient states she is going to look for another orthopedic.

## 2016-02-26 NOTE — Telephone Encounter (Signed)
LVM for pt to call back.

## 2016-03-03 ENCOUNTER — Ambulatory Visit: Payer: Medicare Other | Admitting: Family

## 2016-03-04 ENCOUNTER — Ambulatory Visit: Payer: Medicare Other | Admitting: Family

## 2016-03-11 ENCOUNTER — Telehealth: Payer: Self-pay | Admitting: *Deleted

## 2016-03-11 NOTE — Telephone Encounter (Signed)
Left msg on triage requesting call back. Call pt no answer LMOM RTC...Dawn Kim

## 2016-03-12 MED ORDER — HYDROCHLOROTHIAZIDE 25 MG PO TABS: 25.0000 mg | ORAL_TABLET | Freq: Every day | ORAL | Status: DC

## 2016-03-12 NOTE — Telephone Encounter (Signed)
MD sent rx on 5/16, resent to [place]...Dawn Kim

## 2016-03-12 NOTE — Telephone Encounter (Signed)
Pt called back stated Dr. Quay Burow want her to Start hctz 25 mg daily with the Ramipril since her BP is high. Please send to [place].

## 2016-03-12 NOTE — Addendum Note (Signed)
Addended by: Earnstine Regal on: 03/12/2016 04:42 PM   Modules accepted: Orders

## 2016-03-12 NOTE — Telephone Encounter (Signed)
Tried contact pt again a gentleman answer stating this is not the correct # for pt, and not to call bck. Closing encounter...Dawn Kim

## 2016-03-15 ENCOUNTER — Encounter: Payer: Medicare Other | Admitting: Internal Medicine

## 2016-03-15 ENCOUNTER — Other Ambulatory Visit: Payer: Medicare Other

## 2016-03-16 ENCOUNTER — Other Ambulatory Visit: Payer: Self-pay | Admitting: Internal Medicine

## 2016-03-17 NOTE — Telephone Encounter (Signed)
RX faxed to [facility] 

## 2016-03-29 ENCOUNTER — Other Ambulatory Visit: Payer: Medicare Other

## 2016-04-05 ENCOUNTER — Other Ambulatory Visit: Payer: Self-pay | Admitting: Internal Medicine

## 2016-04-05 NOTE — Telephone Encounter (Signed)
Please advise. Pt is not due for Xanax until 04/16/16. Other Med is not on current med list.

## 2016-04-06 NOTE — Telephone Encounter (Signed)
Do not fill xanax early.  Refuse sonata - we discontinued that medication - she is now taking trazodone and we have not discussed continuing the sonata

## 2016-04-08 ENCOUNTER — Ambulatory Visit: Payer: Medicare Other | Admitting: Internal Medicine

## 2016-04-09 ENCOUNTER — Encounter: Payer: Self-pay | Admitting: Vascular Surgery

## 2016-04-12 ENCOUNTER — Other Ambulatory Visit: Payer: Self-pay | Admitting: Internal Medicine

## 2016-04-12 ENCOUNTER — Encounter: Payer: Medicare Other | Admitting: Vascular Surgery

## 2016-04-12 DIAGNOSIS — H04123 Dry eye syndrome of bilateral lacrimal glands: Secondary | ICD-10-CM | POA: Diagnosis not present

## 2016-04-13 ENCOUNTER — Encounter: Payer: Medicare Other | Admitting: Internal Medicine

## 2016-04-15 ENCOUNTER — Other Ambulatory Visit: Payer: Self-pay | Admitting: Internal Medicine

## 2016-04-15 NOTE — Telephone Encounter (Signed)
Called refill into [facility] spoke w/Marsha gave md approval.../lmb

## 2016-04-19 ENCOUNTER — Encounter: Payer: Medicare Other | Admitting: Vascular Surgery

## 2016-04-20 ENCOUNTER — Telehealth: Payer: Self-pay | Admitting: Internal Medicine

## 2016-04-20 NOTE — Telephone Encounter (Signed)
Please advise if you would like me to order.

## 2016-04-20 NOTE — Telephone Encounter (Signed)
Yes ok to order  

## 2016-04-20 NOTE — Telephone Encounter (Signed)
Sorry, intended to route to taylor

## 2016-04-20 NOTE — Telephone Encounter (Signed)
Patient called to request that we issue her a cologuard screening kit. She states that she is wanting to do this as a preventative measure.

## 2016-04-23 ENCOUNTER — Inpatient Hospital Stay: Admission: RE | Admit: 2016-04-23 | Payer: Medicare Other | Source: Ambulatory Visit

## 2016-04-30 ENCOUNTER — Telehealth: Payer: Self-pay | Admitting: Emergency Medicine

## 2016-04-30 NOTE — Telephone Encounter (Signed)
Pt called back about ordering her cologuard. Please follow up thanks.

## 2016-05-03 NOTE — Telephone Encounter (Signed)
Ordered

## 2016-05-03 NOTE — Telephone Encounter (Signed)
LVM informing pt Cologuard was ordered. Will take them 24-48 hours to contact her.

## 2016-05-04 ENCOUNTER — Encounter: Payer: Medicare Other | Admitting: Vascular Surgery

## 2016-05-05 NOTE — Telephone Encounter (Signed)
Pt advised in detail 

## 2016-05-06 ENCOUNTER — Ambulatory Visit
Admission: RE | Admit: 2016-05-06 | Discharge: 2016-05-06 | Disposition: A | Discharge status: Home / Self Care | Payer: Medicare Other | Source: Ambulatory Visit | Attending: Obstetrics & Gynecology | Admitting: Obstetrics & Gynecology

## 2016-05-06 DIAGNOSIS — Z780 Asymptomatic menopausal state: Secondary | ICD-10-CM | POA: Diagnosis not present

## 2016-05-06 DIAGNOSIS — M8589 Other specified disorders of bone density and structure, multiple sites: Secondary | ICD-10-CM | POA: Diagnosis not present

## 2016-05-06 DIAGNOSIS — E2839 Other primary ovarian failure: Secondary | ICD-10-CM

## 2016-05-09 DIAGNOSIS — Z1212 Encounter for screening for malignant neoplasm of rectum: Secondary | ICD-10-CM | POA: Diagnosis not present

## 2016-05-09 DIAGNOSIS — Z1211 Encounter for screening for malignant neoplasm of colon: Secondary | ICD-10-CM | POA: Diagnosis not present

## 2016-05-09 LAB — COLOGUARD: Cologuard: NEGATIVE

## 2016-05-17 ENCOUNTER — Other Ambulatory Visit: Payer: Self-pay | Admitting: Internal Medicine

## 2016-05-17 NOTE — Telephone Encounter (Signed)
RX faxed to [facility] 

## 2016-05-30 NOTE — Progress Notes (Signed)
Subjective:    Patient ID: Dawn Kim, female    DOB: 04-10-1946, 70 y.o.   MRN: [id number]  HPI appt cancelled  Hypertension: She is taking her medication daily. She is compliant with a low sodium diet.  She denies chest pain, palpitations, edema, shortness of breath and regular headaches. She is exercising regularly.  She does not monitor her blood pressure at home.    Hypothyroidism:  She is taking her medication daily.  She denies any recent changes in energy or weight that are unexplained.   GERD:  She is taking her medication daily as prescribed.  She denies any GERD symptoms and feels her GERD is well controlled.   Anxiety:  Insomnia:    Medications and allergies reviewed with patient and updated if appropriate.  Patient Active Problem List   Diagnosis Date Noted  . Hypothyroidism 10/22/2015  . Depression 10/22/2015  . Anxiety 10/22/2015  . GERD (gastroesophageal reflux disease) 10/22/2015  . Insomnia 10/22/2015  . Foot pain, right 10/22/2015  . Bilateral knee pain 10/22/2015  . HTN (hypertension) 01/20/2012  . Lipid disorder 01/20/2012    Current Outpatient Prescriptions on File Prior to Visit  Medication Sig Dispense Refill  . ALPRAZolam (XANAX) 0.25 MG tablet Take 1 tablet (0.25 mg total) by mouth 3 (three) times daily as needed. 90 tablet 0  . Biotin 5000 MCG TABS Take 1 tablet by mouth daily.    . calcium carbonate (OS-CAL - DOSED IN MG OF ELEMENTAL CALCIUM) 1250 MG tablet Take 1 tablet by mouth daily.     . cholecalciferol (VITAMIN D) 1000 UNITS tablet Take 1,000 Units by mouth daily.    . diclofenac (VOLTAREN) 75 MG EC tablet Take 1 tablet (75 mg total) by mouth 2 (two) times daily. Take with food. 60 tablet 3  . diclofenac sodium (VOLTAREN) 1 % GEL Apply 2 g topically 2 (two) times daily as needed (for pain). 300 g 0  . ferrous sulfate 325 (65 FE) MG tablet Take 325 mg by mouth daily with breakfast.    . fish oil-omega-3 fatty acids 1000 MG capsule  Take 1 g by mouth 2 (two) times daily.     [name] gabapentin (NEURONTIN) 300 MG capsule Take one pill in morning, take two pills at bedtime 270 capsule 0  . hydrochlorothiazide (HYDRODIURIL) 25 MG tablet Take 1 tablet (25 mg total) by mouth daily. 90 tablet 3  . levothyroxine (SYNTHROID, LEVOTHROID) 125 MCG tablet Take 1 tablet (125 mcg total) by mouth daily. 90 tablet 3  . Multiple Vitamin (MULTIVITAMIN) tablet Take 1 tablet by mouth daily.    [name] omeprazole (PRILOSEC) 40 MG capsule Take 1 capsule (40 mg total) by mouth daily. 90 capsule 3  . ramipril (ALTACE) 10 MG capsule Take 1 capsule (10 mg total) by mouth daily. 90 capsule 3  . simvastatin (ZOCOR) 40 MG tablet Take 1 tablet (40 mg total) by mouth every evening. 90 tablet 3  . traZODone (DESYREL) 100 MG tablet Take 1.5-2 tablets (150-200 mg total) by mouth at bedtime. 60 tablet 5  . venlafaxine XR (EFFEXOR-XR) 150 MG 24 hr capsule Take 1 capsule (150 mg total) by mouth 2 (two) times daily. 180 capsule 3   Current Facility-Administered Medications on File Prior to Visit  Medication Dose Route Frequency Provider Last Rate Last Dose  . triamcinolone acetonide (KENALOG) 10 MG/ML injection 10 mg  10 mg Other Once Harriet Masson, DPM      . triamcinolone acetonide (KENALOG) 10  MG/ML injection 10 mg  10 mg Other Once Harriet Masson, DPM        Past Medical History:  Diagnosis Date  . Cataract    Bil  . Depression   . Hypercholesteremia   . Hypertension   . Thyroid disease     Past Surgical History:  Procedure Laterality Date  . CHOLECYSTECTOMY    . COLONOSCOPY    . FOOT SURGERY  2011   right foot  . JOINT REPLACEMENT  2010   left knee  . POLYPECTOMY    . TONSILLECTOMY      Social History   Social History  . Marital status: Married    Spouse name: N/A  . Number of children: N/A  . Years of education: N/A   Social History Main Topics  . Smoking status: Former Smoker    Types: Cigarettes    Quit date: 11/04/2009  . Smokeless  tobacco: Never Used  . Alcohol use No  . Drug use: No  . Sexual activity: Not on file   Other Topics Concern  . Not on file   Social History Narrative  . No narrative on file    Family History  Problem Relation Age of Onset  . Heart disease Mother     Review of Systems     Objective:  There were no vitals filed for this visit. There were no vitals filed for this visit. There is no height or weight on file to calculate BMI.   Physical Exam        Assessment & Plan:       This encounter was created in error - please disregard.

## 2016-05-31 ENCOUNTER — Other Ambulatory Visit (INDEPENDENT_AMBULATORY_CARE_PROVIDER_SITE_OTHER): Payer: Medicare Other

## 2016-05-31 ENCOUNTER — Encounter: Payer: Self-pay | Admitting: Nurse Practitioner

## 2016-05-31 ENCOUNTER — Ambulatory Visit (INDEPENDENT_AMBULATORY_CARE_PROVIDER_SITE_OTHER): Payer: Medicare Other | Admitting: Nurse Practitioner

## 2016-05-31 ENCOUNTER — Encounter: Payer: Medicare Other | Admitting: Internal Medicine

## 2016-05-31 DIAGNOSIS — I10 Essential (primary) hypertension: Secondary | ICD-10-CM | POA: Diagnosis not present

## 2016-05-31 DIAGNOSIS — G4700 Insomnia, unspecified: Secondary | ICD-10-CM

## 2016-05-31 DIAGNOSIS — F419 Anxiety disorder, unspecified: Secondary | ICD-10-CM

## 2016-05-31 DIAGNOSIS — R600 Localized edema: Secondary | ICD-10-CM | POA: Insufficient documentation

## 2016-05-31 DIAGNOSIS — M810 Age-related osteoporosis without current pathological fracture: Secondary | ICD-10-CM | POA: Diagnosis not present

## 2016-05-31 DIAGNOSIS — K219 Gastro-esophageal reflux disease without esophagitis: Secondary | ICD-10-CM

## 2016-05-31 DIAGNOSIS — F329 Major depressive disorder, single episode, unspecified: Secondary | ICD-10-CM | POA: Diagnosis not present

## 2016-05-31 DIAGNOSIS — E789 Disorder of lipoprotein metabolism, unspecified: Secondary | ICD-10-CM

## 2016-05-31 DIAGNOSIS — F32A Depression, unspecified: Secondary | ICD-10-CM

## 2016-05-31 DIAGNOSIS — E038 Other specified hypothyroidism: Secondary | ICD-10-CM

## 2016-05-31 LAB — BASIC METABOLIC PANEL
BUN: 17 mg/dL (ref 6–23)
CHLORIDE: 99 meq/L (ref 96–112)
CO2: 35 meq/L — AB (ref 19–32)
CREATININE: 0.96 mg/dL (ref 0.40–1.20)
Calcium: 9.3 mg/dL (ref 8.4–10.5)
GFR: 60.72 mL/min (ref >60.00)
GLUCOSE: 91 mg/dL (ref 70–99)
Potassium: 3.5 mEq/L (ref 3.5–5.1)
Sodium: 141 mEq/L (ref 135–145)

## 2016-05-31 LAB — BRAIN NATRIURETIC PEPTIDE: PRO B NATRI PEPTIDE: 71 pg/mL (ref 0.0–100.0)

## 2016-05-31 MED ORDER — CALCIUM CARBONATE 1250 (500 CA) MG PO TABS: 1.0000 | ORAL_TABLET | Freq: Every day | ORAL | Status: AC

## 2016-05-31 NOTE — Assessment & Plan Note (Signed)
Stable.  No change in medication. 

## 2016-05-31 NOTE — Assessment & Plan Note (Signed)
Stable Maintain Effexor and xanax

## 2016-05-31 NOTE — Progress Notes (Signed)
Subjective:  Patient ID: Dawn Kim, female    DOB: 1945/12/01  Age: 70 y.o. MRN: [id number]  CC: Follow-up (HTN, Insomnia, anxiety/depression) and Edema (x74months.)  HPI RUTHELMA BESCH presents for f/up of chronic conditions.  States she has also developed LE edema in the last 37months.  HTN: improved with medication, tolerating HCTZ and ramipril well. No Cp, no palpitation, no SOB. Developed edema x6months  Insomnia: improved with use of trazodone, has developed edema, weight gain and impaired memory in last 28months.  Anxiety/Depression: controlled with effexor and xanax.  GERD: controlled, no black stool, no ABD pain or nausea or vomiting.  Hypothyroidism: controlled.  Edema: onset 60months ago, improves with elevation, no PND.  Also brings report of dexa scan which indicates osteoporosis, still taking calcium with Vit D supplement.  Outpatient Medications Prior to Visit  Medication Sig Dispense Refill  . ALPRAZolam (XANAX) 0.25 MG tablet Take 1 tablet (0.25 mg total) by mouth 3 (three) times daily as needed. 90 tablet 0  . Biotin 5000 MCG TABS Take 1 tablet by mouth daily.    . cholecalciferol (VITAMIN D) 1000 UNITS tablet Take 1,000 Units by mouth daily.    . diclofenac sodium (VOLTAREN) 1 % GEL Apply 2 g topically 2 (two) times daily as needed (for pain). 300 g 0  . ferrous sulfate 325 (65 FE) MG tablet Take 325 mg by mouth daily with breakfast.    . fish oil-omega-3 fatty acids 1000 MG capsule Take 1 g by mouth 2 (two) times daily.     [name] gabapentin (NEURONTIN) 300 MG capsule Take one pill in morning, take two pills at bedtime 270 capsule 0  . hydrochlorothiazide (HYDRODIURIL) 25 MG tablet Take 1 tablet (25 mg total) by mouth daily. 90 tablet 3  . levothyroxine (SYNTHROID, LEVOTHROID) 125 MCG tablet Take 1 tablet (125 mcg total) by mouth daily. 90 tablet 3  . Multiple Vitamin (MULTIVITAMIN) tablet Take 1 tablet by mouth daily.    [name] omeprazole (PRILOSEC) 40 MG capsule Take  1 capsule (40 mg total) by mouth daily. 90 capsule 3  . ramipril (ALTACE) 10 MG capsule Take 1 capsule (10 mg total) by mouth daily. 90 capsule 3  . simvastatin (ZOCOR) 40 MG tablet Take 1 tablet (40 mg total) by mouth every evening. 90 tablet 3  . traZODone (DESYREL) 100 MG tablet Take 1.5-2 tablets (150-200 mg total) by mouth at bedtime. 60 tablet 5  . venlafaxine XR (EFFEXOR-XR) 150 MG 24 hr capsule Take 1 capsule (150 mg total) by mouth 2 (two) times daily. 180 capsule 3  . calcium carbonate (OS-CAL - DOSED IN MG OF ELEMENTAL CALCIUM) 1250 MG tablet Take 1 tablet by mouth daily.     . diclofenac (VOLTAREN) 75 MG EC tablet Take 1 tablet (75 mg total) by mouth 2 (two) times daily. Take with food. (Patient not taking: Reported on 05/31/2016) 60 tablet 3   Facility-Administered Medications Prior to Visit  Medication Dose Route Frequency Provider Last Rate Last Dose  . triamcinolone acetonide (KENALOG) 10 MG/ML injection 10 mg  10 mg Other Once Harriet Masson, DPM      . triamcinolone acetonide (KENALOG) 10 MG/ML injection 10 mg  10 mg Other Once Harriet Masson, DPM        ROS Review of Systems  Constitutional: Positive for unexpected weight change. Negative for activity change, appetite change, chills and fever.  Eyes: Negative for visual disturbance.  Respiratory: Negative for shortness of breath.   Cardiovascular:  Positive for leg swelling. Negative for chest pain and palpitations.  Gastrointestinal: Negative for abdominal pain, blood in stool, constipation, diarrhea, nausea and vomiting.  Genitourinary: Negative for frequency.  Skin: Negative for rash.  Neurological: Negative for headaches.  Psychiatric/Behavioral: Negative for confusion and suicidal ideas.    Objective:  BP (P) 128/78   Pulse (P) 76   Temp (P) 98.1 F (36.7 C)   Wt (P) 207 lb (93.9 kg)   SpO2 (P) 98%   BMI (P) 37.26 kg/m   BP Readings from Last 3 Encounters:  05/31/16 (P) 128/78  02/17/16 (!) 160/100    01/08/16 (!) 146/100    Wt Readings from Last 3 Encounters:  05/31/16 (P) 207 lb (93.9 kg)  02/17/16 193 lb 1.9 oz (87.6 kg)  01/08/16 190 lb (86.2 kg)    Physical Exam  Constitutional: She is oriented to person, place, and time. She appears well-developed. No distress.  Neck: Normal range of motion. Neck supple. No thyromegaly present.  Cardiovascular: Normal rate and normal heart sounds.   Pulmonary/Chest: Effort normal and breath sounds normal.  Abdominal: Soft.  Musculoskeletal: She exhibits edema.  Lymphadenopathy:    She has no cervical adenopathy.  Neurological: She is alert and oriented to person, place, and time.  Skin: Skin is warm and dry.  Psychiatric: She has a normal mood and affect.    Lab Results  Component Value Date   WBC 7.2 11/03/2015   HGB 14.6 11/03/2015   HCT 44.2 11/03/2015   PLT 269.0 11/03/2015   GLUCOSE 91 05/31/2016   ALT 15 11/03/2015   AST 15 11/03/2015   NA 141 05/31/2016   K 3.5 05/31/2016   CL 99 05/31/2016   CREATININE 0.96 05/31/2016   BUN 17 05/31/2016   CO2 35 (H) 05/31/2016   TSH 0.48 02/17/2016   INR 2.33 (H) 12/02/2009    Dg Bone Density  Result Date: 05/06/2016 EXAM: DUAL X-RAY ABSORPTIOMETRY (DXA) FOR BONE MINERAL DENSITY IMPRESSION: Referring Physician:  Sanjuana Kava PATIENT: Name: Achante, Sturdy Patient ID: [id number] Birth Date: Jul 08, 1946 Height: 62.5 in. Sex: Female Measured: 05/06/2016 Weight: 204.0 lbs. Indications: Advanced Age, Caucasian, Estrogen Deficient, Postmenopausal, Synthroid Fractures: None Treatments: Calcium (E943.0), Vitamin D (E933.5) ASSESSMENT: The BMD measured at Forearm Radius 33% is 0.719 g/cm2 with a T-score of -1.9. This patient is considered osteopenic according to [facility]) criteria. Lumbar spine was not utilized due to advanced degenerative changes. Per the official positions of the ISCD, it is not possible to quantitatively compare BMD or calculate an [facility] between exams done at  different facilities. Site Region Measured Date Measured Age YA BMD Significant CHANGE T-score Left Forearm Radius 33% 05/06/2016 71.9 -1.9 0.719 g/cm2 DualFemur Neck Left 05/06/2016 71.9 -1.0 0.901 g/cm2 World Health Organization [facility]) criteria for post-menopausal, Caucasian Women: Normal       T-score at or above -1 SD Osteopenia   T-score between -1 and -2.5 SD Osteoporosis T-score at or below -2.5 SD RECOMMENDATION: [place] recommends that FDA-approved medical therapies be considered in postmenopausal women and men age 57 or older with a: 1. Hip or vertebral (clinical or morphometric) fracture. 2. T-score of <-2.5 at the spine or hip. 3. Ten-year fracture probability by FRAX of 3% or greater for hip fracture or 20% or greater for major osteoporotic fracture. All treatment decisions require clinical judgment and consideration of individual patient factors, including patient preferences, co-morbidities, previous drug use, risk factors not captured in the FRAX model (e.g. falls,  vitamin D deficiency, increased bone turnover, interval significant decline in bone density) and possible under - or over-estimation of fracture risk by FRAX. All patients should ensure an adequate intake of dietary calcium (1200 mg/d) and vitamin D (800 IU daily) unless contraindicated. FOLLOW-UP: People with diagnosed cases of osteoporosis or at high risk for fracture should have regular bone mineral density tests. For patients eligible for Medicare, routine testing is allowed once every 2 years. The testing frequency can be increased to one year for patients who have rapidly progressing disease, those who are receiving or discontinuing medical therapy to restore bone mass, or have additional risk factors. I have reviewed this report, and agree with the above findings. [facility] FRAX* 10-year Probability of Fracture Based on femoral neck BMD: DualFemur (Left) Major Osteoporotic Fracture: 8.4% Hip  Fracture:                0.9% Population:                  [place] (Caucasian) Risk Factors:                None *FRAX is a Materials engineer of the [employer] for Metabolic Bone Disease, a World Pharmacologist (WHO) [facility]. ASSESSMENT: The probability of a major osteoporotic fracture is 8.4 % withinthe next ten years. The probability of a hip fracture is 0.9 % within the next ten tears. Electronically Signed   By: Fidela Salisbury M.D.   On: 05/06/2016 13:22    Assessment & Plan:   Ayleth was seen today for follow-up and edema.  Diagnoses and all orders for this visit:  Essential hypertension -     Basic Metabolic Panel (BMET); Future -     Pro b natriuretic peptide; Future  Depression  Insomnia  Gastroesophageal reflux disease, esophagitis presence not specified  Anxiety  Lower leg edema -     Pro b natriuretic peptide; Future  Osteoporosis -     Vitamin D 1,25 dihydroxy; Future -     calcium carbonate (OS-CAL - DOSED IN MG OF ELEMENTAL CALCIUM) 1250 (500 Ca) MG tablet; Take 1 tablet (500 mg of elemental calcium total) by mouth daily.  Other specified hypothyroidism  Lipid disorder -     Lipid Profile; Future   I have changed Ms. Buss's calcium carbonate. I am also having her maintain her multivitamin, fish oil-omega-3 fatty acids, ferrous sulfate, Biotin, cholecalciferol, ramipril, omeprazole, simvastatin, venlafaxine XR, levothyroxine, gabapentin, diclofenac, traZODone, diclofenac sodium, hydrochlorothiazide, and ALPRAZolam. We will continue to administer triamcinolone acetonide and triamcinolone acetonide.  Meds ordered this encounter  Medications  . calcium carbonate (OS-CAL - DOSED IN MG OF ELEMENTAL CALCIUM) 1250 (500 Ca) MG tablet    Sig: Take 1 tablet (500 mg of elemental calcium total) by mouth daily.    Order Specific Question:   Supervising Provider    Answer:   Cassandria Anger [[id number]]    Follow-up: Return in  about 3 months (around 08/31/2016) for HTN, edema, Insomnia, Osteoporosis.  Wilfred Lacy, NP

## 2016-05-31 NOTE — Assessment & Plan Note (Signed)
Check BMP and proBNP.

## 2016-05-31 NOTE — Progress Notes (Signed)
Pre visit review using our clinic review tool, if applicable. No additional management support is needed unless otherwise documented below in the visit note. 

## 2016-05-31 NOTE — Assessment & Plan Note (Signed)
Stable, no change in medication

## 2016-05-31 NOTE — Assessment & Plan Note (Signed)
Stable Maintain effexor and xanax

## 2016-05-31 NOTE — Assessment & Plan Note (Addendum)
Improved BP Check BMP today Maintain HCTZ and ramipril

## 2016-05-31 NOTE — Assessment & Plan Note (Signed)
Check vit D level Start prolia

## 2016-05-31 NOTE — Patient Instructions (Addendum)
Osteoporosis:  Your bone density scan showed that you have osteoporosis.  There has been no improvement since your last scan. You have a high risk of a fracture.   To maintain or improve your bone density you should exercise regularly and make sure you are getting about 1000-2000 units of vitamin D daily. Ideally you should be getting approximately 1200 mg of calcium daily in a combination of food and supplements, but no more than 600 mg should come from supplements. Dairy products, dark green vegetables, beans and oily fish, such as salmon, are good sources of calcium.   I will be starting you on Prolia.  Prolia is a medication that is given in our office as an injection twice a year.  It helps decrease the breakdown of bone and is well tolerated.  Let me know if you have any questions or concerns.

## 2016-06-04 ENCOUNTER — Telehealth: Payer: Self-pay | Admitting: Internal Medicine

## 2016-06-04 LAB — VITAMIN D 1,25 DIHYDROXY
VITAMIN D 1, 25 (OH) TOTAL: 21 pg/mL
Vitamin D3 1, 25 (OH)2: 20 pg/mL

## 2016-06-04 NOTE — Telephone Encounter (Signed)
Forwarding to [place] for advise:   I did not see where anything was rx'ed for knee swelling in the medication list.

## 2016-06-04 NOTE — Telephone Encounter (Signed)
Pt was wondering if Dawn Kim can give her something for knee knee being swelling and pain. Please advise. Saw Dawn Kim on 05/31/16.

## 2016-06-04 NOTE — Telephone Encounter (Signed)
I did not see this patient for knee pain , nor did she mention anything about knee pain during Ov. She will need to make another  appt with Dr. Senaida Ores to eval knee pain and re eval LE edema. Thank you.

## 2016-06-04 NOTE — Telephone Encounter (Signed)
Pt would like someone to call her about her collogued results

## 2016-06-04 NOTE — Telephone Encounter (Signed)
Reviewed portal. Cologuard results are negative.  Pt informed. Modifier added to overdue maintenance and result abstracted. Place in PCP box for initial and scan.

## 2016-06-08 ENCOUNTER — Encounter: Payer: Self-pay | Admitting: Internal Medicine

## 2016-06-08 NOTE — Telephone Encounter (Signed)
Advise pt of massage below. Pt stating she will call back once she is ready to make an appt.

## 2016-06-09 ENCOUNTER — Telehealth: Payer: Self-pay | Admitting: Internal Medicine

## 2016-06-09 NOTE — Telephone Encounter (Signed)
Let her know her cologuard is negative.  Please document in health Maintenance.

## 2016-06-10 NOTE — Telephone Encounter (Signed)
Results were given by [facility] c, CMA on 06/04/16

## 2016-06-14 ENCOUNTER — Other Ambulatory Visit: Payer: Self-pay | Admitting: Internal Medicine

## 2016-06-14 NOTE — Telephone Encounter (Signed)
RX faxed to [facility] 

## 2016-06-18 ENCOUNTER — Ambulatory Visit: Payer: Medicare Other | Admitting: Internal Medicine

## 2016-06-18 DIAGNOSIS — M545 Low back pain: Secondary | ICD-10-CM | POA: Diagnosis not present

## 2016-06-18 DIAGNOSIS — M79671 Pain in right foot: Secondary | ICD-10-CM | POA: Diagnosis not present

## 2016-06-18 DIAGNOSIS — M25562 Pain in left knee: Secondary | ICD-10-CM | POA: Diagnosis not present

## 2016-06-18 DIAGNOSIS — M25561 Pain in right knee: Secondary | ICD-10-CM | POA: Diagnosis not present

## 2016-06-18 DIAGNOSIS — M79672 Pain in left foot: Secondary | ICD-10-CM | POA: Diagnosis not present

## 2016-06-18 DIAGNOSIS — G894 Chronic pain syndrome: Secondary | ICD-10-CM | POA: Diagnosis not present

## 2016-06-21 ENCOUNTER — Ambulatory Visit (INDEPENDENT_AMBULATORY_CARE_PROVIDER_SITE_OTHER): Payer: Medicare Other | Admitting: Internal Medicine

## 2016-06-21 ENCOUNTER — Encounter: Payer: Self-pay | Admitting: Internal Medicine

## 2016-06-21 VITALS — BP 120/76 | HR 67 | Temp 98.4°F | Resp 18 | Wt 202.0 lb

## 2016-06-21 DIAGNOSIS — K219 Gastro-esophageal reflux disease without esophagitis: Secondary | ICD-10-CM | POA: Diagnosis not present

## 2016-06-21 DIAGNOSIS — M8580 Other specified disorders of bone density and structure, unspecified site: Secondary | ICD-10-CM | POA: Insufficient documentation

## 2016-06-21 DIAGNOSIS — F32A Depression, unspecified: Secondary | ICD-10-CM

## 2016-06-21 DIAGNOSIS — Z23 Encounter for immunization: Secondary | ICD-10-CM | POA: Diagnosis not present

## 2016-06-21 DIAGNOSIS — I10 Essential (primary) hypertension: Secondary | ICD-10-CM | POA: Diagnosis not present

## 2016-06-21 DIAGNOSIS — E038 Other specified hypothyroidism: Secondary | ICD-10-CM | POA: Diagnosis not present

## 2016-06-21 DIAGNOSIS — L089 Local infection of the skin and subcutaneous tissue, unspecified: Secondary | ICD-10-CM | POA: Insufficient documentation

## 2016-06-21 DIAGNOSIS — F419 Anxiety disorder, unspecified: Secondary | ICD-10-CM

## 2016-06-21 DIAGNOSIS — G4700 Insomnia, unspecified: Secondary | ICD-10-CM | POA: Diagnosis not present

## 2016-06-21 DIAGNOSIS — L723 Sebaceous cyst: Secondary | ICD-10-CM

## 2016-06-21 DIAGNOSIS — H00013 Hordeolum externum right eye, unspecified eyelid: Secondary | ICD-10-CM

## 2016-06-21 DIAGNOSIS — F329 Major depressive disorder, single episode, unspecified: Secondary | ICD-10-CM

## 2016-06-21 DIAGNOSIS — H00019 Hordeolum externum unspecified eye, unspecified eyelid: Secondary | ICD-10-CM | POA: Insufficient documentation

## 2016-06-21 MED ORDER — CEPHALEXIN 500 MG PO CAPS: 500.0000 mg | ORAL_CAPSULE | Freq: Three times a day (TID) | ORAL | 0 refills | Status: DC

## 2016-06-21 MED ORDER — ERYTHROMYCIN 5 MG/GM OP OINT: 1.0000 "application " | TOPICAL_OINTMENT | Freq: Every day | OPHTHALMIC | 0 refills | Status: DC

## 2016-06-21 NOTE — Assessment & Plan Note (Signed)
Continue calcium, vitamin d Encouraged exercise No other treatment needed

## 2016-06-21 NOTE — Progress Notes (Signed)
Pre visit review using our clinic review tool, if applicable. No additional management support is needed unless otherwise documented below in the visit note. 

## 2016-06-21 NOTE — Assessment & Plan Note (Signed)
Fairly controlled Continue trazodone nightly

## 2016-06-21 NOTE — Patient Instructions (Addendum)
Apply the antibiotic ointment to your right lower lid at bedtime.  Continue to apply the warm compresses.   Take the antibiotic, Keflex, three times a day for 10 days.  If your infection under your left arm does not improve or worsens please call.  Start applying warm compresses to the area a few times a day.     Flu vaccine administered today.   Medications reviewed and updated.  Changes include those mentioned above.   Your prescription(s) have been submitted to your pharmacy. Please take as directed and contact our office if you believe you are having problem(s) with the medication(s).    Please followup in 6 months

## 2016-06-21 NOTE — Assessment & Plan Note (Signed)
tsh in normal range in may 2017 Continue current dose of medication

## 2016-06-21 NOTE — Assessment & Plan Note (Signed)
Present for two weeks No improvement with warm compresses erythromycin ointment

## 2016-06-21 NOTE — Assessment & Plan Note (Addendum)
BP well controlled Current regimen effective and well tolerated Continue current medications at current doses cmp in august

## 2016-06-21 NOTE — Assessment & Plan Note (Signed)
Not controlled, but does not want to try to come off effexor and try something different Continue current medications Can not afford seeing a psychiatrist

## 2016-06-21 NOTE — Assessment & Plan Note (Signed)
Controlled, stable Continue current dose of medication  

## 2016-06-21 NOTE — Progress Notes (Signed)
Subjective:    Patient ID: Dawn Kim, female    DOB: 03-23-1946, 70 y.o.   MRN: [id number]  HPI The patient is here for follow up.  Stye right lower eyelid:  It has been there for two weeks.  She has been applying warm compresses.  She denies eye pain, changes vision or discharge from the eye.   Rash, skin rawness:  For two weeks she has a couple of boils under her left armpit.  She denies prior episodes.  They came to a head and there was discharge.  Now there are a couple of areas of raw skin.    Hypertension: She is taking her medication daily. She is compliant with a low sodium diet.  She denies chest pain, palpitations, edema, shortness of breath and regular headaches. She is exercising regularly.  She does not monitor her blood pressure at home.    Hypothyroidism:  She is taking her medication daily.  She denies any recent changes in energy or weight that are unexplained.   GERD:  She is taking her medication daily as prescribed.  She denies any GERD symptoms and feels her GERD is well controlled.   Anxiety:  She is taking xanax 3 times a day only as needed and effexor daily.      Insomnia:  She is taking trazodone nightly.  Her sleep is fairly controlled.    Depression: She is taking her medication daily as prescribed. She denies any side effects from the medication. She feels her depression is not well controlled, but does not want to change medications.      Bilateral knee pain:  She did see pain management and was given a prescription for hydrocodone.  She is not sure she wants to follow with them anymore because of the requirements required by their office.  She is taking gabapentin twice daily.     Medications and allergies reviewed with patient and updated if appropriate.  Patient Active Problem List   Diagnosis Date Noted  . Stye external 06/21/2016  . Infected sebaceous cyst of skin 06/21/2016  . Osteoporosis 05/31/2016  . Lower leg edema 05/31/2016  .  Hypothyroidism 10/22/2015  . Depression 10/22/2015  . Anxiety 10/22/2015  . GERD (gastroesophageal reflux disease) 10/22/2015  . Insomnia 10/22/2015  . Foot pain, right 10/22/2015  . Bilateral knee pain 10/22/2015  . HTN (hypertension) 01/20/2012  . Lipid disorder 01/20/2012    Current Outpatient Prescriptions on File Prior to Visit  Medication Sig Dispense Refill  . ALPRAZolam (XANAX) 0.25 MG tablet TAKE ONE TABLET BY MOUTH THREE TIMES DAILY AS NEEDED 90 tablet 0  . Biotin 5000 MCG TABS Take 1 tablet by mouth daily.    . calcium carbonate (OS-CAL - DOSED IN MG OF ELEMENTAL CALCIUM) 1250 (500 Ca) MG tablet Take 1 tablet (500 mg of elemental calcium total) by mouth daily.    . cholecalciferol (VITAMIN D) 1000 UNITS tablet Take 1,000 Units by mouth daily.    . diclofenac sodium (VOLTAREN) 1 % GEL Apply 2 g topically 2 (two) times daily as needed (for pain). 300 g 0  . ferrous sulfate 325 (65 FE) MG tablet Take 325 mg by mouth daily with breakfast.    . fish oil-omega-3 fatty acids 1000 MG capsule Take 1 g by mouth 2 (two) times daily.     [name] gabapentin (NEURONTIN) 300 MG capsule Take one pill in morning, take two pills at bedtime 270 capsule 0  . hydrochlorothiazide (HYDRODIURIL) 25  MG tablet Take 1 tablet (25 mg total) by mouth daily. 90 tablet 3  . levothyroxine (SYNTHROID, LEVOTHROID) 125 MCG tablet Take 1 tablet (125 mcg total) by mouth daily. 90 tablet 3  . Multiple Vitamin (MULTIVITAMIN) tablet Take 1 tablet by mouth daily.    [name] omeprazole (PRILOSEC) 40 MG capsule Take 1 capsule (40 mg total) by mouth daily. 90 capsule 3  . ramipril (ALTACE) 10 MG capsule Take 1 capsule (10 mg total) by mouth daily. 90 capsule 3  . simvastatin (ZOCOR) 40 MG tablet Take 1 tablet (40 mg total) by mouth every evening. 90 tablet 3  . traZODone (DESYREL) 100 MG tablet Take 1.5-2 tablets (150-200 mg total) by mouth at bedtime. 60 tablet 5  . venlafaxine XR (EFFEXOR-XR) 150 MG 24 hr capsule Take 1 capsule  (150 mg total) by mouth 2 (two) times daily. 180 capsule 3   Current Facility-Administered Medications on File Prior to Visit  Medication Dose Route Frequency Provider Last Rate Last Dose  . triamcinolone acetonide (KENALOG) 10 MG/ML injection 10 mg  10 mg Other Once Harriet Masson, DPM      . triamcinolone acetonide (KENALOG) 10 MG/ML injection 10 mg  10 mg Other Once Harriet Masson, DPM        Past Medical History:  Diagnosis Date  . Cataract    Bil  . Depression   . Hypercholesteremia   . Hypertension   . Thyroid disease     Past Surgical History:  Procedure Laterality Date  . CHOLECYSTECTOMY    . COLONOSCOPY    . FOOT SURGERY  2011   right foot  . JOINT REPLACEMENT  2010   left knee  . POLYPECTOMY    . TONSILLECTOMY      Social History   Social History  . Marital status: Married    Spouse name: N/A  . Number of children: N/A  . Years of education: N/A   Social History Main Topics  . Smoking status: Former Smoker    Types: Cigarettes    Quit date: 11/04/2009  . Smokeless tobacco: Never Used  . Alcohol use No  . Drug use: No  . Sexual activity: Not on file   Other Topics Concern  . Not on file   Social History Narrative  . No narrative on file    Family History  Problem Relation Age of Onset  . Heart disease Mother     Review of Systems  Constitutional: Negative for fever.  Respiratory: Negative for cough, shortness of breath and wheezing.   Cardiovascular: Negative for chest pain, palpitations and leg swelling.  Gastrointestinal: Negative for abdominal pain.       Gerd controlled  Musculoskeletal: Positive for arthralgias.  Neurological: Negative for light-headedness and headaches.  Psychiatric/Behavioral: Positive for dysphoric mood and sleep disturbance (controlled). The patient is not nervous/anxious.        Objective:   Vitals:   06/21/16 1303  BP: 120/76  Pulse: 67  Resp: 18  Temp: 98.4 F (36.9 C)   Filed Weights   06/21/16 1303   Weight: 202 lb (91.6 kg)   Body mass index is 36.36 kg/m.   Physical Exam    Constitutional: Appears well-developed and well-nourished. No distress.  HENT:  Head: Normocephalic and atraumatic. right lower eye lid in medial aspect with erythema, no pinpoint, no conjunctival erythema or discharge Neck: Neck supple. No tracheal deviation present. No thyromegaly present.  Cardiovascular: Normal rate, regular rhythm and normal heart sounds.  No murmur heard. No carotid bruit  Pulmonary/Chest: Effort normal and breath sounds normal. No respiratory distress. No has no wheezes. No rales.  Musculoskeletal: No edema.  Lymphadenopathy: No cervical adenopathy.  Skin: Skin is warm and dry. Not diaphoretic. left axilla: three boils infected - two are open and draining Psychiatric: Normal mood and affect. Behavior is normal.     Assessment & Plan:    Flu vaccine today  See Problem List for Assessment and Plan of chronic medical problems.   Follow up in 6 months

## 2016-06-21 NOTE — Assessment & Plan Note (Signed)
Three cysts left axilla with active drainage in one Warm compresses Will start an antibiotic - keflex 500 mg TID x 10 days Return if no improvement or if symptoms worsen

## 2016-07-03 ENCOUNTER — Other Ambulatory Visit: Payer: Self-pay | Admitting: Internal Medicine

## 2016-07-10 DIAGNOSIS — H04122 Dry eye syndrome of left lacrimal gland: Secondary | ICD-10-CM | POA: Diagnosis not present

## 2016-07-10 DIAGNOSIS — H04121 Dry eye syndrome of right lacrimal gland: Secondary | ICD-10-CM | POA: Diagnosis not present

## 2016-07-13 ENCOUNTER — Telehealth: Payer: Self-pay | Admitting: Emergency Medicine

## 2016-07-13 NOTE — Telephone Encounter (Signed)
Pt called and stated she has been taking 3 capsules instead of 2 of gabapentin (NEURONTIN) 300 MG capsule. She is running out of her medication faster and wants to know if she can get a refill? Please advise thanks.

## 2016-07-13 NOTE — Telephone Encounter (Signed)
Ok to increase and send in script - I am assuming she is taking 4/day - one in the morning and three at night - just confirm before sending in

## 2016-07-13 NOTE — Telephone Encounter (Signed)
Please advise 

## 2016-07-14 ENCOUNTER — Other Ambulatory Visit: Payer: Self-pay | Admitting: Internal Medicine

## 2016-07-14 MED ORDER — GABAPENTIN 300 MG PO CAPS: ORAL_CAPSULE | ORAL | 0 refills | Status: DC

## 2016-07-14 NOTE — Addendum Note (Signed)
Addended by: Terence Lux B on: 07/14/2016 09:39 AM   Modules accepted: Orders

## 2016-07-14 NOTE — Telephone Encounter (Signed)
Faxed to [facility] 

## 2016-07-14 NOTE — Telephone Encounter (Signed)
Spoke with pt. States she is taking 1 at bedtime and 2 during the day. rx sent to [facility]

## 2016-08-02 ENCOUNTER — Ambulatory Visit (INDEPENDENT_AMBULATORY_CARE_PROVIDER_SITE_OTHER): Payer: Medicare Other | Admitting: Orthopedic Surgery

## 2016-08-11 ENCOUNTER — Telehealth: Payer: Self-pay | Admitting: Internal Medicine

## 2016-08-11 ENCOUNTER — Other Ambulatory Visit: Payer: Self-pay | Admitting: Internal Medicine

## 2016-08-11 MED ORDER — ALPRAZOLAM 0.25 MG PO TABS: 0.2500 mg | ORAL_TABLET | Freq: Three times a day (TID) | ORAL | 0 refills | Status: DC | PRN

## 2016-08-11 NOTE — Telephone Encounter (Signed)
Ok to fill - xanax printed

## 2016-08-11 NOTE — Telephone Encounter (Signed)
Patient is requesting refill on alprazolam and venlafaxine to be sent to [facility] at [facility].

## 2016-08-12 ENCOUNTER — Telehealth: Payer: Self-pay

## 2016-08-12 NOTE — Telephone Encounter (Signed)
RX faxed to [facility] 

## 2016-08-12 NOTE — Telephone Encounter (Signed)
What med is this for?

## 2016-08-12 NOTE — Telephone Encounter (Signed)
PA received on this patient for exceeding plan quantity limits. Can the prescribed total daily dose be achieved with a lower quantity of a higher strength, that does not exceed the quantity limit (e.g. one 60 mg tablet/day in place of two 30 mg tablets/day)?  Please advise, thanks!

## 2016-08-13 NOTE — Telephone Encounter (Signed)
PA APPROVED 08/13/2016 through 08/13/2017. Pharmacy to be notified via CoverMyMeds

## 2016-08-13 NOTE — Telephone Encounter (Signed)
She has been on that dose long term.  She feels it is working and is stable on that dose.  She is unable to see a psychiatrist because it is not covered by her insurance.  She has tried multiple medications in the past and they were not effective.  For these reasons I feel it is necessary for her to remain on the effexor XR 300 mg daily.

## 2016-08-13 NOTE — Telephone Encounter (Signed)
PA initiated via CoverMyMeds key [id number].  MD clinical rationale added to request

## 2016-08-13 NOTE — Telephone Encounter (Signed)
Sorry, Effexor

## 2016-08-19 ENCOUNTER — Ambulatory Visit (INDEPENDENT_AMBULATORY_CARE_PROVIDER_SITE_OTHER): Payer: Medicare Other | Admitting: Orthopedic Surgery

## 2016-08-21 ENCOUNTER — Other Ambulatory Visit: Payer: Self-pay | Admitting: Internal Medicine

## 2016-09-01 ENCOUNTER — Other Ambulatory Visit (INDEPENDENT_AMBULATORY_CARE_PROVIDER_SITE_OTHER): Payer: Medicare Other

## 2016-09-01 ENCOUNTER — Encounter: Payer: Self-pay | Admitting: Internal Medicine

## 2016-09-01 ENCOUNTER — Ambulatory Visit (INDEPENDENT_AMBULATORY_CARE_PROVIDER_SITE_OTHER): Payer: Medicare Other | Admitting: Internal Medicine

## 2016-09-01 VITALS — BP 150/82 | HR 79 | Temp 97.7°F | Resp 16 | Wt 204.0 lb

## 2016-09-01 DIAGNOSIS — E038 Other specified hypothyroidism: Secondary | ICD-10-CM

## 2016-09-01 DIAGNOSIS — M25562 Pain in left knee: Secondary | ICD-10-CM

## 2016-09-01 DIAGNOSIS — E789 Disorder of lipoprotein metabolism, unspecified: Secondary | ICD-10-CM

## 2016-09-01 DIAGNOSIS — Z23 Encounter for immunization: Secondary | ICD-10-CM

## 2016-09-01 DIAGNOSIS — M8580 Other specified disorders of bone density and structure, unspecified site: Secondary | ICD-10-CM | POA: Diagnosis not present

## 2016-09-01 DIAGNOSIS — I10 Essential (primary) hypertension: Secondary | ICD-10-CM | POA: Diagnosis not present

## 2016-09-01 DIAGNOSIS — G8929 Other chronic pain: Secondary | ICD-10-CM

## 2016-09-01 DIAGNOSIS — K219 Gastro-esophageal reflux disease without esophagitis: Secondary | ICD-10-CM | POA: Diagnosis not present

## 2016-09-01 DIAGNOSIS — G4700 Insomnia, unspecified: Secondary | ICD-10-CM

## 2016-09-01 DIAGNOSIS — F419 Anxiety disorder, unspecified: Secondary | ICD-10-CM

## 2016-09-01 DIAGNOSIS — F329 Major depressive disorder, single episode, unspecified: Secondary | ICD-10-CM

## 2016-09-01 DIAGNOSIS — F32A Depression, unspecified: Secondary | ICD-10-CM

## 2016-09-01 LAB — COMPREHENSIVE METABOLIC PANEL
ALBUMIN: 4.2 g/dL (ref 3.5–5.2)
ALK PHOS: 40 U/L (ref 39–117)
ALT: 19 U/L (ref 0–35)
AST: 21 U/L (ref 0–37)
BUN: 16 mg/dL (ref 6–23)
CALCIUM: 9.6 mg/dL (ref 8.4–10.5)
CHLORIDE: 100 meq/L (ref 96–112)
CO2: 32 mEq/L (ref 19–32)
CREATININE: 0.92 mg/dL (ref 0.40–1.20)
GFR: 63.73 mL/min (ref >60.00)
Glucose, Bld: 90 mg/dL (ref 70–99)
POTASSIUM: 3.9 meq/L (ref 3.5–5.1)
SODIUM: 140 meq/L (ref 135–145)
TOTAL PROTEIN: 6.7 g/dL (ref 6.0–8.3)
Total Bilirubin: 0.7 mg/dL (ref 0.2–1.2)

## 2016-09-01 LAB — CBC WITH DIFFERENTIAL/PLATELET
BASOS PCT: 0.2 % (ref 0.0–3.0)
Basophils Absolute: 0 10*3/uL (ref 0.0–0.1)
EOS PCT: 2.8 % (ref 0.0–5.0)
Eosinophils Absolute: 0.2 10*3/uL (ref 0.0–0.7)
HEMATOCRIT: 42.6 % (ref 36.0–46.0)
HEMOGLOBIN: 14.3 g/dL (ref 12.0–15.0)
LYMPHS PCT: 40.3 % (ref 12.0–46.0)
Lymphs Abs: 2.6 10*3/uL (ref 0.7–4.0)
MCHC: 33.5 g/dL (ref 30.0–36.0)
MCV: 90.3 fl (ref 78.0–100.0)
MONO ABS: 0.5 10*3/uL (ref 0.1–1.0)
MONOS PCT: 8.1 % (ref 3.0–12.0)
Neutro Abs: 3.2 10*3/uL (ref 1.4–7.7)
Neutrophils Relative %: 48.6 % (ref 43.0–77.0)
Platelets: 287 10*3/uL (ref 150.0–400.0)
RBC: 4.71 Mil/uL (ref 3.87–5.11)
RDW: 12.8 % (ref 11.5–15.5)
WBC: 6.5 10*3/uL (ref 4.0–10.5)

## 2016-09-01 LAB — LIPID PANEL
CHOLESTEROL: 154 mg/dL (ref 0–200)
HDL: 48.8 mg/dL (ref >39.00)
LDL Cholesterol: 72 mg/dL (ref 0–99)
NONHDL: 105.61
Total CHOL/HDL Ratio: 3
Triglycerides: 167 mg/dL — ABNORMAL HIGH (ref 0.0–149.0)
VLDL: 33.4 mg/dL (ref 0.0–40.0)

## 2016-09-01 LAB — TSH: TSH: 0.13 u[IU]/mL — AB (ref 0.35–4.50)

## 2016-09-01 LAB — VITAMIN D 25 HYDROXY (VIT D DEFICIENCY, FRACTURES): VITD: 36.95 ng/mL (ref 30.00–100.00)

## 2016-09-01 NOTE — Progress Notes (Signed)
Pre visit review using our clinic review tool, if applicable. No additional management support is needed unless otherwise documented below in the visit note. 

## 2016-09-01 NOTE — Progress Notes (Signed)
Subjective:    Patient ID: Dawn Kim, female    DOB: Mar 04, 1946, 70 y.o.   MRN: [id number]  HPI The patient is here for follow up.  Anxiety: She is taking her medication daily as prescribed. She denies any side effects from the medication. She feels her anxiety is not well controlled.     Depression: She is taking her medication daily as prescribed. She denies any side effects from the medication. She has been on the medication for years and does not want to change it.  She feels her depression is well controlled and she is happy with her current dose of medication.   Insomnia: She sleeps only 4-5 hours at night.  She feels the medication is not working well.  She wonders about trying [place] again - it did make her hungover and there was one instance of her driving in the morning and was not clear or felt hungover.  She is unsure if she snores and is unsure if she has ever had apnea.  When she wakes she does feel refreshed, but then will become tired and be tired all day.  She does not nap.   GERD:  She is taking her medication daily as prescribed.  She denies any GERD symptoms and feels her GERD is well controlled.   Hypertension: She is taking her medication daily. She is compliant with a low sodium diet.  She denies chest pain, palpitations, edema, shortness of breath and regular headaches. She is exercising regularly.  She does not monitor her blood pressure at home.    Hyperlipidemia: She is taking her medication daily. She is compliant with a low fat/cholesterol diet. She is exercising regularly. She denies myalgias.   Hypothyroidism:  She is taking her medication daily.  She denies any recent changes in energy or weight that are unexplained.      Medications and allergies reviewed with patient and updated if appropriate.  Patient Active Problem List   Diagnosis Date Noted  . Stye external 06/21/2016  . Infected sebaceous cyst of skin 06/21/2016  . Osteopenia 06/21/2016    . Lower leg edema 05/31/2016  . Hypothyroidism 10/22/2015  . Depression 10/22/2015  . Anxiety 10/22/2015  . GERD (gastroesophageal reflux disease) 10/22/2015  . Insomnia 10/22/2015  . Foot pain, right 10/22/2015  . Bilateral knee pain 10/22/2015  . HTN (hypertension) 01/20/2012  . Lipid disorder 01/20/2012    Current Outpatient Prescriptions on File Prior to Visit  Medication Sig Dispense Refill  . ALPRAZolam (XANAX) 0.25 MG tablet Take 1 tablet (0.25 mg total) by mouth 3 (three) times daily as needed. 90 tablet 0  . Biotin 5000 MCG TABS Take 1 tablet by mouth daily.    . calcium carbonate (OS-CAL - DOSED IN MG OF ELEMENTAL CALCIUM) 1250 (500 Ca) MG tablet Take 1 tablet (500 mg of elemental calcium total) by mouth daily.    . cholecalciferol (VITAMIN D) 1000 UNITS tablet Take 1,000 Units by mouth daily.    . diclofenac sodium (VOLTAREN) 1 % GEL APPLY 2 GRAMS TOPICALLY TWO TIMES DAILY AS NEEDED FOR PAIN 300 g 0  . ferrous sulfate 325 (65 FE) MG tablet Take 325 mg by mouth daily with breakfast.    . fish oil-omega-3 fatty acids 1000 MG capsule Take 1 g by mouth 2 (two) times daily.     [name] gabapentin (NEURONTIN) 300 MG capsule Take one pill in morning, take two pills at bedtime 270 capsule 0  . hydrochlorothiazide (HYDRODIURIL)  25 MG tablet Take 1 tablet (25 mg total) by mouth daily. 90 tablet 3  . levothyroxine (SYNTHROID, LEVOTHROID) 125 MCG tablet Take 1 tablet (125 mcg total) by mouth daily. 90 tablet 3  . Multiple Vitamin (MULTIVITAMIN) tablet Take 1 tablet by mouth daily.    [name] omeprazole (PRILOSEC) 40 MG capsule Take 1 capsule (40 mg total) by mouth daily. 90 capsule 3  . ramipril (ALTACE) 10 MG capsule Take 1 capsule (10 mg total) by mouth daily. 90 capsule 3  . simvastatin (ZOCOR) 40 MG tablet Take 1 tablet (40 mg total) by mouth every evening. 90 tablet 3  . traZODone (DESYREL) 100 MG tablet TAKE ONE & ONE-HALF TO TWO TABLETS BY MOUTH AT BEDTIME 60 tablet 5  . venlafaxine XR  (EFFEXOR-XR) 150 MG 24 hr capsule Take 1 capsule (150 mg total) by mouth 2 (two) times daily. 180 capsule 3   Current Facility-Administered Medications on File Prior to Visit  Medication Dose Route Frequency Provider Last Rate Last Dose  . triamcinolone acetonide (KENALOG) 10 MG/ML injection 10 mg  10 mg Other Once Harriet Masson, DPM      . triamcinolone acetonide (KENALOG) 10 MG/ML injection 10 mg  10 mg Other Once Harriet Masson, DPM        Past Medical History:  Diagnosis Date  . Cataract    Bil  . Depression   . Hypercholesteremia   . Hypertension   . Thyroid disease     Past Surgical History:  Procedure Laterality Date  . CHOLECYSTECTOMY    . COLONOSCOPY    . FOOT SURGERY  2011   right foot  . JOINT REPLACEMENT  2010   left knee  . POLYPECTOMY    . TONSILLECTOMY      Social History   Social History  . Marital status: Married    Spouse name: N/A  . Number of children: N/A  . Years of education: N/A   Social History Main Topics  . Smoking status: Former Smoker    Types: Cigarettes    Quit date: 11/04/2009  . Smokeless tobacco: Never Used  . Alcohol use No  . Drug use: No  . Sexual activity: Not Asked   Other Topics Concern  . None   Social History Narrative  . None    Family History  Problem Relation Age of Onset  . Heart disease Mother     Review of Systems  Constitutional: Negative for fever.  Respiratory: Negative for cough, shortness of breath and wheezing.   Cardiovascular: Negative for chest pain, palpitations and leg swelling.  Neurological: Negative for light-headedness and headaches.  Psychiatric/Behavioral: Positive for dysphoric mood. The patient is nervous/anxious.        Objective:   Vitals:   09/01/16 1054  BP: (!) 150/82  Pulse: 79  Resp: 16  Temp: 97.7 F (36.5 C)   Filed Weights   09/01/16 1054  Weight: 204 lb (92.5 kg)   Body mass index is 36.72 kg/m.   Physical Exam    Constitutional: Appears well-developed  and well-nourished. No distress.  HENT:  Head: Normocephalic and atraumatic.  Neck: Neck supple. No tracheal deviation present. No thyromegaly present.  No cervical lymphadenopathy Cardiovascular: Normal rate, regular rhythm and normal heart sounds.   No murmur heard. No carotid bruit .  No edema Pulmonary/Chest: Effort normal and breath sounds normal. No respiratory distress. No has no wheezes. No rales.  Skin: Skin is warm and dry. Not diaphoretic.  Psychiatric: anxious mood  and affect. Behavior is normal.      Assessment & Plan:    See Problem List for Assessment and Plan of chronic medical problems.

## 2016-09-01 NOTE — Patient Instructions (Addendum)
Schedule your mammogram.    Test(s) ordered today. Your results will be released to [place] (or called to you) after review, usually within 72hours after test completion. If any changes need to be made, you will be notified at that same time.  All other Health Maintenance issues reviewed.   All recommended immunizations and age-appropriate screenings are up-to-date or discussed.  prevnar  administered today.   Medications reviewed and updated.  No changes recommended at this time.  Your prescription(s) have been submitted to your pharmacy. Please take as directed and contact our office if you believe you are having problem(s) with the medication(s).   Please followup in 6 months

## 2016-09-02 ENCOUNTER — Telehealth: Payer: Self-pay | Admitting: Internal Medicine

## 2016-09-02 NOTE — Assessment & Plan Note (Signed)
GERD controlled Continue daily medication  

## 2016-09-02 NOTE — Assessment & Plan Note (Signed)
Not controlled, but has tried several medication had they were not effective or she had side effects Will not start ambien again given hungover feeling - did not remember driving to doctors and has appeared "under the influence' when on the medication encouraged a sleep study Unable to see psych - not covered by insurance and can not afford Discussed sleep hygeine

## 2016-09-02 NOTE — Assessment & Plan Note (Signed)
Not ideally controlled per her, but she does not want to change the effexor  Will not increase the xanax Psychiatry is not covered by her insurance Will continue current meds

## 2016-09-02 NOTE — Assessment & Plan Note (Signed)
BP Readings from Last 3 Encounters:  09/01/16 (!) 150/82  06/21/16 120/76  05/31/16 (P) 128/78    Typically well controlled Will continue current medication at current doses and monitor cmp

## 2016-09-02 NOTE — Assessment & Plan Note (Signed)
Controlled, stable Continue current dose of medication  

## 2016-09-02 NOTE — Telephone Encounter (Signed)
We discussed this yesterday and I advised she have it done.  If she is willing to go through with it I will schedule -- she will need to see a doctor first for eval and then they will schedule.

## 2016-09-02 NOTE — Assessment & Plan Note (Signed)
Check tsh  Titrate med dose if needed  

## 2016-09-02 NOTE — Assessment & Plan Note (Signed)
Check vitamin d level

## 2016-09-02 NOTE — Assessment & Plan Note (Signed)
Check lipids Continue statin Continue walking

## 2016-09-02 NOTE — Telephone Encounter (Signed)
Patient is requesting the nurse to call her back about the the sleep study. She states Dr. Quay Burow had talked her to about it before. And making sure her insurance covered it. She states the she would pay 20% of it. Please follow up, Thank you.

## 2016-09-02 NOTE — Telephone Encounter (Signed)
Please advise if you are okay with putting this in.. I looked in the referrals and pt declined when she was called to schedule appt.

## 2016-09-03 ENCOUNTER — Telehealth: Payer: Self-pay | Admitting: Internal Medicine

## 2016-09-03 DIAGNOSIS — R5383 Other fatigue: Secondary | ICD-10-CM

## 2016-09-03 DIAGNOSIS — G4700 Insomnia, unspecified: Secondary | ICD-10-CM

## 2016-09-03 NOTE — Telephone Encounter (Signed)
LV informing pt that and eval will be before an actual sleep study. Please order.

## 2016-09-03 NOTE — Telephone Encounter (Signed)
Patient states she is suppose to be referred for a sleep study?  Do not see referral or order.  Can you please follow up in regard.

## 2016-09-03 NOTE — Telephone Encounter (Signed)
Referral ordered to neuro for sleep eval.

## 2016-09-03 NOTE — Telephone Encounter (Signed)
LVM informing pt

## 2016-09-04 ENCOUNTER — Other Ambulatory Visit: Payer: Self-pay | Admitting: Internal Medicine

## 2016-09-04 MED ORDER — LEVOTHYROXINE SODIUM 112 MCG PO TABS: 112.0000 ug | ORAL_TABLET | Freq: Every day | ORAL | 3 refills | Status: DC

## 2016-09-07 ENCOUNTER — Other Ambulatory Visit: Payer: Self-pay | Admitting: Internal Medicine

## 2016-09-07 NOTE — Telephone Encounter (Signed)
[place] controlled substance database checked.  rx last filled 11/9 - due for refill 12/7.  Ok to fill medication.

## 2016-09-08 ENCOUNTER — Other Ambulatory Visit: Payer: Self-pay | Admitting: Internal Medicine

## 2016-09-08 NOTE — Telephone Encounter (Signed)
RX faxed to [facility] 

## 2016-09-21 ENCOUNTER — Ambulatory Visit: Payer: Medicare Other | Admitting: Family Medicine

## 2016-09-28 ENCOUNTER — Other Ambulatory Visit: Payer: Self-pay | Admitting: Internal Medicine

## 2016-09-30 ENCOUNTER — Other Ambulatory Visit: Payer: Self-pay | Admitting: Internal Medicine

## 2016-09-30 NOTE — Telephone Encounter (Signed)
90 day supply filled on 07/14/2016. Please advise

## 2016-10-04 ENCOUNTER — Other Ambulatory Visit: Payer: Self-pay | Admitting: Internal Medicine

## 2016-10-05 NOTE — Telephone Encounter (Signed)
RX faxed to [facility] 

## 2016-10-07 ENCOUNTER — Institutional Professional Consult (permissible substitution): Payer: Medicare Other | Admitting: Neurology

## 2016-11-02 ENCOUNTER — Other Ambulatory Visit: Payer: Self-pay | Admitting: Internal Medicine

## 2016-11-03 NOTE — Telephone Encounter (Signed)
RX faxed to [facility] 

## 2016-11-08 ENCOUNTER — Ambulatory Visit (INDEPENDENT_AMBULATORY_CARE_PROVIDER_SITE_OTHER): Payer: Medicare Other | Admitting: Orthopedic Surgery

## 2016-11-10 ENCOUNTER — Other Ambulatory Visit: Payer: Self-pay | Admitting: Internal Medicine

## 2016-11-29 ENCOUNTER — Other Ambulatory Visit: Payer: Self-pay | Admitting: Internal Medicine

## 2016-12-01 NOTE — Telephone Encounter (Signed)
Notified pt refill has been called into [facility] spoke w/Betty gave MD approval.../lmb

## 2016-12-01 NOTE — Telephone Encounter (Signed)
Pt called to check up on this request. Please call her once its done.

## 2016-12-01 NOTE — Telephone Encounter (Signed)
[place] controlled substance database checked.  Ok to fill medication.  

## 2016-12-05 ENCOUNTER — Other Ambulatory Visit: Payer: Self-pay | Admitting: Internal Medicine

## 2016-12-09 ENCOUNTER — Ambulatory Visit (INDEPENDENT_AMBULATORY_CARE_PROVIDER_SITE_OTHER)
Admission: RE | Admit: 2016-12-09 | Discharge: 2016-12-09 | Disposition: A | Discharge status: Home / Self Care | Payer: Medicare Other | Source: Ambulatory Visit | Attending: Family Medicine | Admitting: Family Medicine

## 2016-12-09 ENCOUNTER — Ambulatory Visit: Payer: Self-pay

## 2016-12-09 ENCOUNTER — Ambulatory Visit (INDEPENDENT_AMBULATORY_CARE_PROVIDER_SITE_OTHER): Payer: Medicare Other | Admitting: Family Medicine

## 2016-12-09 ENCOUNTER — Encounter: Payer: Self-pay | Admitting: Family Medicine

## 2016-12-09 VITALS — BP 120/74 | HR 75 | Wt 207.0 lb

## 2016-12-09 DIAGNOSIS — G8929 Other chronic pain: Secondary | ICD-10-CM

## 2016-12-09 DIAGNOSIS — Z96652 Presence of left artificial knee joint: Secondary | ICD-10-CM

## 2016-12-09 DIAGNOSIS — M25562 Pain in left knee: Principal | ICD-10-CM

## 2016-12-09 DIAGNOSIS — T8484XA Pain due to internal orthopedic prosthetic devices, implants and grafts, initial encounter: Secondary | ICD-10-CM | POA: Diagnosis not present

## 2016-12-09 NOTE — Patient Instructions (Addendum)
I am sorry for the bad news.  I feel the knee is unstable  We will try to get you in with Dr. Maureen Ralphs.,  Xray today  pennsaid pinkie amount topically 2 times daily as needed.  Call me if you need me or have questions.

## 2016-12-09 NOTE — Assessment & Plan Note (Signed)
Patient does have some very mild instability possibly noted. We discussed we can do further workup including x-rays as well as potentially a bone scan which patient declined. Patient thinks that she needs something more aggressive. We discussed the potential stability brace which patient also declined. We did put an order for a knee x-ray to further evaluate. Patient was referred to her choice of orthopedic surgeon, Dr. Ricki Rodriguez. Discussed with her having this instability feeling further workup will likely be necessary.

## 2016-12-09 NOTE — Progress Notes (Signed)
[facility] [place], [place] [address] Phone: [phone] Subjective:    I'm seeing this patient by the request  of:  Binnie Rail, MD   CC: Left knee pain  [id number]  Dawn Kim is a 71 y.o. female coming in with complaint of left knee pain. Replacement In 2011. Patient was doing fairly well. Recently did have another fall 1 year ago that seems to give this pain patient states that she has fallen multiple times since this. Patient states that she feels like there is swelling. States that it is sore all the time. Even hurts to touch. Rates the severity pain is 8 out of 10. Patient states that she is unable to play with her 73-year-old grandson.  Last imaging shows the patient does have a left knee x-ray taken 09/01/2015. Patient was found to have osteopenia but no chair instability of the knee.   Patient also had a bone scan in August 2015 I did not show any true loosening of the prosthesis at that time.    Past Medical History:  Diagnosis Date  . Cataract    Bil  . Depression   . Hypercholesteremia   . Hypertension   . Thyroid disease    Past Surgical History:  Procedure Laterality Date  . CHOLECYSTECTOMY    . COLONOSCOPY    . FOOT SURGERY  2011   right foot  . JOINT REPLACEMENT  2010   left knee  . POLYPECTOMY    . TONSILLECTOMY     Social History   Social History  . Marital status: Married    Spouse name: N/A  . Number of children: N/A  . Years of education: N/A   Social History Main Topics  . Smoking status: Former Smoker    Types: Cigarettes    Quit date: 11/04/2009  . Smokeless tobacco: Never Used  . Alcohol use No  . Drug use: No  . Sexual activity: Not on file   Other Topics Concern  . Not on file   Social History Narrative  . No narrative on file   Allergies  Allergen Reactions  . Penicillins Hives    Has patient had a PCN reaction causing immediate rash, facial/tongue/throat swelling, SOB or  lightheadedness with hypotension: No Has patient had a PCN reaction causing severe rash involving mucus membranes or skin necrosis: Yes  Has patient had a PCN reaction that required hospitalization: No Has patient had a PCN reaction occurring within the last 10 years: No  If all of the above answers are "NO", then may proceed with Cephalosporin use.   [name] Amoxicillin Rash    Has patient had a PCN reaction causing immediate rash, facial/tongue/throat swelling, SOB or lightheadedness with hypotension: No  Has patient had a PCN reaction causing severe rash involving mucus membranes or skin necrosis: Yes Has patient had a PCN reaction that required hospitalization No Has patient had a PCN reaction occurring within the last 10 years: No If all of the above answers are "NO", then may proceed with Cephalosporin use.    Family History  Problem Relation Age of Onset  . Heart disease Mother     Past medical history, social, surgical and family history all reviewed in electronic medical record.  No pertanent information unless stated regarding to the chief complaint.   Review of Systems:Review of systems updated and as accurate as of 12/09/16  Positive for headaches, muscle aches, body aches, joint swelling, denies bowel  fever, chills, shortness of breath, visual impairment, abdominal pain, diarrhea or constipation cranial rash  Objective  There were no vitals taken for this visit. Systems examined below as of 12/09/16   General: No apparent distress alert and oriented x3 mood and affect normal, dressed appropriately.  HEENT: Pupils equal, extraocular movements intact  Respiratory: Patient's speak in full sentences and does not appear short of breath  Cardiovascular: No lower extremity edema, non tender, no erythema  Skin: Warm dry intact with no signs of infection or rash on extremities or on axial skeleton.  Abdomen: Soft nontender  Neuro: Cranial nerves II through XII are intact,  neurovascularly intact in all extremities with 2+ DTRs and 2+ pulses.  Lymph: No lymphadenopathy of posterior or anterior cervical chain or axillae bilaterally.  Gait Antalgic gait MSK:  Non tender with full range of motion and good stability and symmetric strength and tone of shoulders, elbows, wrist, hip, and ankles bilaterally. Arthritic changes of multiple joints Knee: Left Incisions well-healed. Diffuse tenderness of seems to be out of proportion Lacks last 15 of flexion and does have crepitus with range of motion.. Some mild laxity of the medial aspect of the knee replacement but no true instability Negative Mcmurray's, Apley's, and Thessalonian tests. Painful compression of the patella. Patellar glide with moderate crepitus. Patellar and quadriceps tendons unremarkable. Hamstring and quadriceps strength is normal.  Contralateral knee has arthritic changes with very minimal tenderness      Impression and Recommendations:     This case required medical decision making of moderate complexity.      Note: This dictation was prepared with Dragon dictation along with smaller phrase technology. Any transcriptional errors that result from this process are unintentional.

## 2016-12-13 ENCOUNTER — Other Ambulatory Visit: Payer: Self-pay | Admitting: Internal Medicine

## 2016-12-20 ENCOUNTER — Other Ambulatory Visit: Payer: Self-pay

## 2016-12-20 DIAGNOSIS — Z96659 Presence of unspecified artificial knee joint: Principal | ICD-10-CM

## 2016-12-20 DIAGNOSIS — T8484XA Pain due to internal orthopedic prosthetic devices, implants and grafts, initial encounter: Secondary | ICD-10-CM

## 2016-12-21 ENCOUNTER — Telehealth: Payer: Self-pay | Admitting: Internal Medicine

## 2016-12-21 NOTE — Telephone Encounter (Signed)
Spoke with patient. She wants to try to get into [facility] since that was Dr. Thompson Caul first recommendation. She is going to try to see Dr. Delrae Sawyers to release her to [facility].

## 2016-12-21 NOTE — Telephone Encounter (Signed)
States [employer] [facility] [employer] is going to send patient to doctor lucio.  States Dr. Maxie Better has to refer.  Please follow up in regard.

## 2016-12-25 ENCOUNTER — Other Ambulatory Visit: Payer: Self-pay | Admitting: Internal Medicine

## 2016-12-27 NOTE — Telephone Encounter (Signed)
RX faxed to [facility] 

## 2016-12-29 ENCOUNTER — Encounter (HOSPITAL_COMMUNITY): Payer: Self-pay | Admitting: Emergency Medicine

## 2016-12-29 ENCOUNTER — Emergency Department (HOSPITAL_COMMUNITY)
Admission: EM | Admit: 2016-12-29 | Discharge: 2016-12-30 | Disposition: A | Discharge status: Home / Self Care | Payer: Medicare Other | Attending: Emergency Medicine | Admitting: Emergency Medicine

## 2016-12-29 DIAGNOSIS — Z79899 Other long term (current) drug therapy: Secondary | ICD-10-CM | POA: Insufficient documentation

## 2016-12-29 DIAGNOSIS — Z008 Encounter for other general examination: Secondary | ICD-10-CM

## 2016-12-29 DIAGNOSIS — I10 Essential (primary) hypertension: Secondary | ICD-10-CM | POA: Diagnosis not present

## 2016-12-29 DIAGNOSIS — F4323 Adjustment disorder with mixed anxiety and depressed mood: Secondary | ICD-10-CM | POA: Diagnosis present

## 2016-12-29 DIAGNOSIS — Z87891 Personal history of nicotine dependence: Secondary | ICD-10-CM | POA: Insufficient documentation

## 2016-12-29 DIAGNOSIS — Z046 Encounter for general psychiatric examination, requested by authority: Secondary | ICD-10-CM | POA: Diagnosis not present

## 2016-12-29 DIAGNOSIS — E039 Hypothyroidism, unspecified: Secondary | ICD-10-CM | POA: Diagnosis not present

## 2016-12-29 DIAGNOSIS — Z96652 Presence of left artificial knee joint: Secondary | ICD-10-CM | POA: Insufficient documentation

## 2016-12-29 DIAGNOSIS — F329 Major depressive disorder, single episode, unspecified: Secondary | ICD-10-CM | POA: Insufficient documentation

## 2016-12-29 LAB — COMPREHENSIVE METABOLIC PANEL
ALBUMIN: 3.9 g/dL (ref 3.5–5.0)
ALT: 22 U/L (ref 14–54)
AST: 23 U/L (ref 15–41)
Alkaline Phosphatase: 53 U/L (ref 38–126)
Anion gap: 8 (ref 5–15)
BUN: 14 mg/dL (ref 6–20)
CHLORIDE: 98 mmol/L — AB (ref 101–111)
CO2: 32 mmol/L (ref 22–32)
CREATININE: 0.98 mg/dL (ref 0.44–1.00)
Calcium: 9 mg/dL (ref 8.9–10.3)
GFR calc Af Amer: >60 mL/min (ref >60)
GFR calc non Af Amer: 56 mL/min — ABNORMAL LOW (ref >60)
GLUCOSE: 101 mg/dL — AB (ref 65–99)
POTASSIUM: 3 mmol/L — AB (ref 3.5–5.1)
Sodium: 138 mmol/L (ref 135–145)
TOTAL PROTEIN: 6.4 g/dL — AB (ref 6.5–8.1)
Total Bilirubin: 0.5 mg/dL (ref 0.3–1.2)

## 2016-12-29 LAB — RAPID URINE DRUG SCREEN, HOSP PERFORMED
AMPHETAMINES: NOT DETECTED
BENZODIAZEPINES: POSITIVE — AB
Barbiturates: NOT DETECTED
COCAINE: NOT DETECTED
Opiates: NOT DETECTED
Tetrahydrocannabinol: NOT DETECTED

## 2016-12-29 LAB — CBC WITH DIFFERENTIAL/PLATELET
BASOS ABS: 0 10*3/uL (ref 0.0–0.1)
BASOS PCT: 0 %
EOS PCT: 3 %
Eosinophils Absolute: 0.3 10*3/uL (ref 0.0–0.7)
HCT: 42.1 % (ref 36.0–46.0)
Hemoglobin: 14.2 g/dL (ref 12.0–15.0)
LYMPHS PCT: 38 %
Lymphs Abs: 3 10*3/uL (ref 0.7–4.0)
MCH: 30.6 pg (ref 26.0–34.0)
MCHC: 33.7 g/dL (ref 30.0–36.0)
MCV: 90.7 fL (ref 78.0–100.0)
MONO ABS: 0.6 10*3/uL (ref 0.1–1.0)
Monocytes Relative: 8 %
NEUTROS ABS: 4.2 10*3/uL (ref 1.7–7.7)
Neutrophils Relative %: 51 %
PLATELETS: 235 10*3/uL (ref 150–400)
RBC: 4.64 MIL/uL (ref 3.87–5.11)
RDW: 12.5 % (ref 11.5–15.5)
WBC: 8 10*3/uL (ref 4.0–10.5)

## 2016-12-29 LAB — ETHANOL

## 2016-12-29 MED ORDER — PANTOPRAZOLE SODIUM 40 MG PO TBEC
40.0000 mg | DELAYED_RELEASE_TABLET | Freq: Every day | ORAL | Status: DC
  Administered 2016-12-30: 40 mg via ORAL
  Filled 2016-12-29: qty 1

## 2016-12-29 MED ORDER — VENLAFAXINE HCL ER 75 MG PO CP24: 150.0000 mg | ORAL_CAPSULE | Freq: Two times a day (BID) | ORAL | Status: DC

## 2016-12-29 MED ORDER — HYDROCHLOROTHIAZIDE 25 MG PO TABS
25.0000 mg | ORAL_TABLET | Freq: Every day | ORAL | Status: DC
  Administered 2016-12-30: 25 mg via ORAL
  Filled 2016-12-29: qty 1

## 2016-12-29 MED ORDER — ACETAMINOPHEN 500 MG PO TABS
500.0000 mg | ORAL_TABLET | Freq: Four times a day (QID) | ORAL | Status: DC | PRN
  Administered 2016-12-29: 500 mg via ORAL
  Filled 2016-12-29: qty 1

## 2016-12-29 MED ORDER — SIMVASTATIN 40 MG PO TABS
40.0000 mg | ORAL_TABLET | Freq: Every evening | ORAL | Status: DC
  Administered 2016-12-29: 40 mg via ORAL
  Filled 2016-12-29: qty 1

## 2016-12-29 MED ORDER — LEVOTHYROXINE SODIUM 112 MCG PO TABS
112.0000 ug | ORAL_TABLET | Freq: Every day | ORAL | Status: DC
  Administered 2016-12-30: 112 ug via ORAL
  Filled 2016-12-29: qty 1

## 2016-12-29 MED ORDER — GABAPENTIN 300 MG PO CAPS
300.0000 mg | ORAL_CAPSULE | Freq: Three times a day (TID) | ORAL | Status: DC
  Administered 2016-12-29 – 2016-12-30 (×2): 300 mg via ORAL
  Filled 2016-12-29 (×2): qty 1

## 2016-12-29 MED ORDER — TRAZODONE HCL 50 MG PO TABS
150.0000 mg | ORAL_TABLET | Freq: Every day | ORAL | Status: DC
  Administered 2016-12-29: 150 mg via ORAL
  Filled 2016-12-29: qty 1

## 2016-12-29 MED ORDER — VENLAFAXINE HCL ER 75 MG PO CP24
150.0000 mg | ORAL_CAPSULE | Freq: Every day | ORAL | Status: DC
  Administered 2016-12-29: 150 mg via ORAL
  Filled 2016-12-29: qty 2

## 2016-12-29 MED ORDER — ALPRAZOLAM 0.25 MG PO TABS: 0.2500 mg | ORAL_TABLET | Freq: Three times a day (TID) | ORAL | Status: DC | PRN

## 2016-12-29 MED ORDER — RAMIPRIL 10 MG PO CAPS
10.0000 mg | ORAL_CAPSULE | Freq: Every day | ORAL | Status: DC
  Administered 2016-12-30: 10 mg via ORAL
  Filled 2016-12-29: qty 1
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
## 2016-12-29 NOTE — Progress Notes (Signed)
As left note by TTS to request attempt to contact niece Melody Joya Gaskins [phone], this counselor attempted , but was placed on hold and no answer. Contacted Ed provider Caryl Ada PA-C as requested and notified of inability to contact. Per Caryl Ada PA-C since collateral was unable to be obtained request and opinion was to Hold pt. Overnight and a.m. Psych eval or additional attempt for collateral Breelynn Bankert K. Nash Shearer, LPC-A, [place]  Counselor 12/29/2016 9:00 PM

## 2016-12-30 DIAGNOSIS — F4323 Adjustment disorder with mixed anxiety and depressed mood: Secondary | ICD-10-CM | POA: Diagnosis present

## 2016-12-30 LAB — URINALYSIS, ROUTINE W REFLEX MICROSCOPIC
BILIRUBIN URINE: NEGATIVE
Glucose, UA: NEGATIVE mg/dL
Hgb urine dipstick: NEGATIVE
KETONES UR: NEGATIVE mg/dL
LEUKOCYTES UA: NEGATIVE
NITRITE: NEGATIVE
PROTEIN: NEGATIVE mg/dL
Specific Gravity, Urine: 1.013 (ref 1.005–1.030)
pH: 5 (ref 5.0–8.0)

## 2016-12-30 LAB — CBG MONITORING, ED: GLUCOSE-CAPILLARY: 95 mg/dL (ref 65–99)

## 2016-12-30 MED ORDER — POTASSIUM CHLORIDE CRYS ER 20 MEQ PO TBCR
20.0000 meq | EXTENDED_RELEASE_TABLET | Freq: Two times a day (BID) | ORAL | Status: DC
  Administered 2016-12-30: 20 meq via ORAL
  Filled 2016-12-30: qty 1

## 2016-12-30 MED ORDER — VENLAFAXINE HCL ER 75 MG PO CP24
150.0000 mg | ORAL_CAPSULE | Freq: Every day | ORAL | Status: DC
  Administered 2016-12-30: 150 mg via ORAL
  Filled 2016-12-30: qty 2

## 2016-12-30 MED ORDER — POTASSIUM CHLORIDE CRYS ER 20 MEQ PO TBCR
40.0000 meq | EXTENDED_RELEASE_TABLET | Freq: Once | ORAL | Status: AC
  Administered 2016-12-30: 40 meq via ORAL
  Filled 2016-12-30: qty 2

## 2016-12-30 NOTE — ED Notes (Signed)
Patient understood AVS discharge instructions.  She is A & O x4.

## 2016-12-30 NOTE — BH Assessment (Signed)
[place] Assessment Progress Note  Per Corena Pilgrim, MD, this pt does not require psychiatric hospitalization at this time.  Pt presents under IVC initiated by her spouse, which Dr Darleene Cleaver has rescinded.  Pt is to be discharged from [facility].  She reports that she receives behavioral health services from her PCP at [facility].  Discharge instructions advise her continue with these services.  If she needs specialized services, referral information for [place] at [facility] has also been included.  Pt's nurse, Magda Paganini, has been notified.  Jalene Mullet, [place] Triage Specialist [phone]

## 2016-12-30 NOTE — ED Notes (Signed)
Calling family members to get a patient ride back home.
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
## 2016-12-30 NOTE — Discharge Instructions (Signed)
For your ongoing behavioral health needs, you are advised to continue treatment with your primary care providers at [facility].  If you find that you need more specialized services, [place] also has a behavioral health clinic.  Contact them to ask about scheduling an intake appointment:       [place] [facility]      [address]      [place], [place] [address]      [phone]

## 2016-12-30 NOTE — Consult Note (Signed)
[facility] Consult   Reason for Consult:  Reports from husband that pt was a danger to herself Referring Physician: EDP Patient Identification: Dawn Kim MRN:  [id number] Principal Diagnosis: Adjustment disorder with mixed anxiety and depressed mood Diagnosis:   Patient Active Problem List   Diagnosis Date Noted  . Adjustment disorder with mixed anxiety and depressed mood [F43.23] 12/30/2016    Priority: High  . 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 Stye external [H00.019] 06/21/2016  . Infected sebaceous cyst of skin [L72.3, L08.9] 06/21/2016  . Osteopenia [M85.80] 06/21/2016  . Lower leg edema [R60.0] 05/31/2016  . Hypothyroidism [E03.9] 10/22/2015  . Depression [F32.9] 10/22/2015  . Anxiety [F41.9] 10/22/2015  . GERD (gastroesophageal reflux disease) [K21.9] 10/22/2015  . Insomnia [G47.00] 10/22/2015  . Foot pain, right [M79.671] 10/22/2015  . Bilateral knee pain [M25.561, M25.562] 10/22/2015  . HTN (hypertension) [I10] 01/20/2012  . Lipid disorder [E78.9] 01/20/2012    Total Time spent with patient: 30 minutes  Subjective:   Dawn Kim is a 71 y.o. female patient admitted with reports from husband with IVC papers that she is a danger to herself and others. Pt seen and chart reviewed. Pt is alert/oriented x4, calm, cooperative, and appropriate to situation. Pt denies suicidal/homicidal ideation and psychosis and does not appear to be responding to internal stimuli. Pt is very lucid and able to explain all of her medications, timing of doses, and what they are for. She has good insight into her situation and denies that she wants to harm herself or others.   HPI:  I have reviewed and concur with HPI elements below, modified as follows: "Dawn Kim is an 71 y.o. female. She presents to [facility] with [employer]. IVC papers in place taken out by patient's spouse. The IVC reads: "Patient is a danger to others and threatened  to kill petitioner/spouse. Patient may having hallucinations because she talks to the tv as if having a conversation with whoever is on. She is also abusing prescription pain pills". Writer discussed case with Johnn Hai, PA who spoke to [facility]. They reported that patient is not the problem but her husband is the issue. Apparently [employer] have had multiple calls to the residence due to the spouse's behaviors toward the patient.   Writer met with patient face to face. She denies suicidal ideations. She also denies history of suicide attempts. Patient however has a documented history of overdose 11-19-2016 triggered by the death of her sister. Patient denies that she has a psychiatrist or therapist. She does have issues with depression on/off. She sts that her spouse is her current stressor because he is often mean and has dementia. She also stated that he is 71 yrs old. Patient has no concerns for her living situation with her spouse. Sts that she feels safe in going back home.   She denies HI. She denies threatening her spouse. Sts, "He is the one who threatens me". Denies AVH's. Patient is oriented x4 and does not appear to be responding to internal stimuli. She denies allegations of talking to the television. Denies alcohol or drug use including prescription.   Writer contacted patient's spouse Dawn Kim [phone] for collateral information 3x's. Writer was unable to reach him each time and unable to leave a voicemail. Also, contacted patients daughter Dawn Kim and left a message to obtain collateral information. "  Pt has been cooperative in the ED and has not demonstrated behaviors or mannerisms  consistent with husband's report. See evaluation above today on 12/30/16.   Past Psychiatric History: depression  Risk to Self: Suicidal Ideation: No Suicidal Intent: No Is patient at risk for suicide?: No Suicidal Plan?: No Access to Means: No What has been your use of drugs/alcohol within the  last 12 months?:  (patient denies ) How many times?:  (1x at least however patient denies ) Other Self Harm Risks:  (denies ) Triggers for Past Attempts: Other (Comment) (death of sister per history) Intentional Self Injurious Behavior: None Risk to Others: Homicidal Ideation: No Thoughts of Harm to Others: No Current Homicidal Intent: No Current Homicidal Plan: No Access to Homicidal Means: No Identified Victim:  (n/a) History of harm to others?: No Assessment of Violence: None Noted Violent Behavior Description:  (patient calm and cooperative; pleasant ) Does patient have access to weapons?: No Criminal Charges Pending?: No Does patient have a court date: No Prior Inpatient Therapy: Prior Inpatient Therapy: Yes Prior Therapy Dates:  (10/2015 [facility] medical floor) Prior Therapy Facilty/Provider(s):  [facility] medical floor for a overdose) Reason for Treatment:  (overdose) Prior Outpatient Therapy: Prior Outpatient Therapy: No Prior Therapy Dates:  (n/a) Prior Therapy Facilty/Provider(s):  (n/a) Reason for Treatment:  (n/a) Does patient have an ACCT team?: No Does patient have Intensive In-House Services?  : No Does patient have [facility] services? : No Does patient have P4CC services?: No  Past Medical History:  Past Medical History:  Diagnosis Date  . Cataract    Bil  . Depression   . Hypercholesteremia   . Hypertension   . Thyroid disease     Past Surgical History:  Procedure Laterality Date  . CHOLECYSTECTOMY    . COLONOSCOPY    . FOOT SURGERY  2011   right foot  . JOINT REPLACEMENT  2010   left knee  . POLYPECTOMY    . TONSILLECTOMY     Family History:  Family History  Problem Relation Age of Onset  . Heart disease Mother    Family Psychiatric  History: denies Social History:  History  Alcohol Use No     History  Drug Use No    Social History   Social History  . Marital status: Married    Spouse name: N/A  . Number of children: N/A  . Years of  education: N/A   Social History Main Topics  . Smoking status: Former Smoker    Types: Cigarettes    Quit date: 11/04/2009  . Smokeless tobacco: Never Used  . Alcohol use No  . Drug use: No  . Sexual activity: Not Asked   Other Topics Concern  . None   Social History Narrative  . None   Additional Social History:    Allergies:   Allergies  Allergen Reactions  . Penicillins Hives    Has patient had a PCN reaction causing immediate rash, facial/tongue/throat swelling, SOB or lightheadedness with hypotension: No Has patient had a PCN reaction causing severe rash involving mucus membranes or skin necrosis: Yes  Has patient had a PCN reaction that required hospitalization: No Has patient had a PCN reaction occurring within the last 10 years: No  If all of the above answers are "NO", then may proceed with Cephalosporin use.   [name] Amoxicillin Rash    Has patient had a PCN reaction causing immediate rash, facial/tongue/throat swelling, SOB or lightheadedness with hypotension: No  Has patient had a PCN reaction causing severe rash involving mucus membranes or skin necrosis: Yes Has patient  had a PCN reaction that required hospitalization No Has patient had a PCN reaction occurring within the last 10 years: No If all of the above answers are "NO", then may proceed with Cephalosporin use.     Labs:  Results for orders placed or performed during the hospital encounter of 12/29/16 (from the past 48 hour(s))  CBG monitoring, ED     Status: None   Collection Time: 12/29/16  8:18 PM  Result Value Ref Range   Glucose-Capillary 95 65 - 99 mg/dL  Comprehensive metabolic panel     Status: Abnormal   Collection Time: 12/29/16  9:09 PM  Result Value Ref Range   Sodium 138 135 - 145 mmol/L   Potassium 3.0 (L) 3.5 - 5.1 mmol/L   Chloride 98 (L) 101 - 111 mmol/L   CO2 32 22 - 32 mmol/L   Glucose, Bld 101 (H) 65 - 99 mg/dL   BUN 14 6 - 20 mg/dL   Creatinine, Ser 0.98 0.44 - 1.00 mg/dL    Calcium 9.0 8.9 - 10.3 mg/dL   Total Protein 6.4 (L) 6.5 - 8.1 g/dL   Albumin 3.9 3.5 - 5.0 g/dL   AST 23 15 - 41 U/L   ALT 22 14 - 54 U/L   Alkaline Phosphatase 53 38 - 126 U/L   Total Bilirubin 0.5 0.3 - 1.2 mg/dL   GFR calc non Af Amer 56 (L) >60 mL/min   GFR calc Af Amer >60 >60 mL/min    Comment: (NOTE) The eGFR has been calculated using the CKD EPI equation. This calculation has not been validated in all clinical situations. eGFR's persistently <60 mL/min signify possible Chronic Kidney Disease.    Anion gap 8 5 - 15  CBC with Diff     Status: None   Collection Time: 12/29/16  9:09 PM  Result Value Ref Range   WBC 8.0 4.0 - 10.5 K/uL   RBC 4.64 3.87 - 5.11 MIL/uL   Hemoglobin 14.2 12.0 - 15.0 g/dL   HCT 42.1 36.0 - 46.0 %   MCV 90.7 78.0 - 100.0 fL   MCH 30.6 26.0 - 34.0 pg   MCHC 33.7 30.0 - 36.0 g/dL   RDW 12.5 11.5 - 15.5 %   Platelets 235 150 - 400 K/uL   Neutrophils Relative % 51 %   Neutro Abs 4.2 1.7 - 7.7 K/uL   Lymphocytes Relative 38 %   Lymphs Abs 3.0 0.7 - 4.0 K/uL   Monocytes Relative 8 %   Monocytes Absolute 0.6 0.1 - 1.0 K/uL   Eosinophils Relative 3 %   Eosinophils Absolute 0.3 0.0 - 0.7 K/uL   Basophils Relative 0 %   Basophils Absolute 0.0 0.0 - 0.1 K/uL  Urine rapid drug screen (hosp performed)not at [facility]     Status: Abnormal   Collection Time: 12/29/16  9:10 PM  Result Value Ref Range   Opiates NONE DETECTED NONE DETECTED   Cocaine NONE DETECTED NONE DETECTED   Benzodiazepines POSITIVE (A) NONE DETECTED   Amphetamines NONE DETECTED NONE DETECTED   Tetrahydrocannabinol NONE DETECTED NONE DETECTED   Barbiturates NONE DETECTED NONE DETECTED    Comment:        DRUG SCREEN FOR MEDICAL PURPOSES ONLY.  IF CONFIRMATION IS NEEDED FOR ANY PURPOSE, NOTIFY LAB WITHIN 5 DAYS.        LOWEST DETECTABLE LIMITS FOR URINE DRUG SCREEN Drug Class       Cutoff (ng/mL) Amphetamine      1000 Barbiturate  200 Benzodiazepine   782 Tricyclics        423 Opiates          300 Cocaine          300 THC              50   Ethanol     Status: None   Collection Time: 12/29/16  9:17 PM  Result Value Ref Range   Alcohol, Ethyl (B) <5 <5 mg/dL    Comment:        LOWEST DETECTABLE LIMIT FOR SERUM ALCOHOL IS 5 mg/dL FOR MEDICAL PURPOSES ONLY     Current Facility-Administered Medications  Medication Dose Route Frequency Provider Last Rate Last Dose  . acetaminophen (TYLENOL) tablet 500 mg  500 mg Oral Q6H PRN Fransico Meadow, PA-C   500 mg at 12/29/16 2219  . ALPRAZolam Duanne Moron) tablet 0.25 mg  0.25 mg Oral TID PRN Fransico Meadow, PA-C      . gabapentin (NEURONTIN) capsule 300 mg  300 mg Oral TID Fransico Meadow, PA-C   300 mg at 12/30/16 1015  . hydrochlorothiazide (HYDRODIURIL) tablet 25 mg  25 mg Oral Daily Hollace Kinnier [place], PA-C   25 mg at 12/30/16 1015  . levothyroxine (SYNTHROID, LEVOTHROID) tablet 112 mcg  112 mcg Oral QAC breakfast Hollace Kinnier [place], PA-C   112 mcg at 12/30/16 0854  . pantoprazole (PROTONIX) EC tablet 40 mg  40 mg Oral Daily Hollace Kinnier [place], PA-C   40 mg at 12/30/16 1015  . potassium chloride SA (K-DUR,KLOR-CON) CR tablet 20 mEq  20 mEq Oral BID Sherwood Gambler, MD   20 mEq at 12/30/16 1015  . ramipril (ALTACE) capsule 10 mg  10 mg Oral Daily Hollace Kinnier [place], PA-C   10 mg at 12/30/16 1015  . simvastatin (ZOCOR) tablet 40 mg  40 mg Oral QPM Hollace Kinnier [place], PA-C   40 mg at 12/29/16 2220  . traZODone (DESYREL) tablet 150 mg  150 mg Oral QHS Hollace Kinnier [place], PA-C   150 mg at 12/29/16 2220  . venlafaxine XR (EFFEXOR-XR) 24 hr capsule 150 mg  150 mg Oral QHS Hollace Kinnier [place], PA-C   150 mg at 12/29/16 2220   Current Outpatient Prescriptions  Medication Sig Dispense Refill  . acetaminophen (TYLENOL) 500 MG tablet Take 500 mg by mouth every 6 (six) hours as needed for mild pain.    [name] ALPRAZolam (XANAX) 0.25 MG tablet TAKE 1 TABLET BY MOUTH THREE TIMES DAILY AS NEEDED 90 tablet 0  . Biotin 5000 MCG TABS Take 1 tablet by mouth daily.    .  calcium carbonate (OS-CAL - DOSED IN MG OF ELEMENTAL CALCIUM) 1250 (500 Ca) MG tablet Take 1 tablet (500 mg of elemental calcium total) by mouth daily.    . cholecalciferol (VITAMIN D) 1000 UNITS tablet Take 1,000 Units by mouth daily.    . diclofenac sodium (VOLTAREN) 1 % GEL APPLY 2 GRAMS TO AFFECTED AREA 2 TIMES A DAY AS NEEDED FOR PAIN 300 g 1  . ferrous sulfate 325 (65 FE) MG tablet Take 325 mg by mouth daily with breakfast.    . fish oil-omega-3 fatty acids 1000 MG capsule Take 1 g by mouth 2 (two) times daily.     [name] gabapentin (NEURONTIN) 300 MG capsule TAKE ONE CAPSULE BY MOUTH IN THE MORNING AND TWO AT BEDTIME 270 capsule 0  . hydrochlorothiazide (HYDRODIURIL) 25 MG tablet Take 1 tablet (25 mg total) by mouth daily.  90 tablet 3  . levothyroxine (SYNTHROID, LEVOTHROID) 112 MCG tablet Take 1 tablet (112 mcg total) by mouth daily. 90 tablet 3  . Multiple Vitamin (MULTIVITAMIN) tablet Take 1 tablet by mouth daily.    [name] omeprazole (PRILOSEC) 40 MG capsule TAKE ONE CAPSULE BY MOUTH ONCE DAILY 90 capsule 3  . ramipril (ALTACE) 10 MG capsule TAKE ONE CAPSULE BY MOUTH ONCE DAILY 90 capsule 3  . simvastatin (ZOCOR) 40 MG tablet TAKE ONE TABLET BY MOUTH IN THE EVENING 90 tablet 3  . traZODone (DESYREL) 100 MG tablet TAKE ONE & ONE-HALF TO TWO TABLETS BY MOUTH AT BEDTIME 60 tablet 5  . venlafaxine XR (EFFEXOR-XR) 150 MG 24 hr capsule TAKE ONE CAPSULE BY MOUTH TWICE DAILY 180 capsule 1    Musculoskeletal: Strength & Muscle Tone: within normal limits Gait & Station: pt in bed Patient leans: N/A  Psychiatric Specialty Exam: Physical Exam  Review of Systems  Psychiatric/Behavioral: Positive for depression and suicidal ideas. Negative for hallucinations and substance abuse. The patient is nervous/anxious and has insomnia.   All other systems reviewed and are negative.   Blood pressure 120/64, pulse 64, temperature 98.4 F (36.9 C), temperature source Oral, resp. rate 18, SpO2 98 %.There is no  height or weight on file to calculate BMI.  General Appearance: Casual and Fairly Groomed  Eye Contact:  Good  Speech:  Clear and Coherent and Normal Rate  Volume:  Normal  Mood:  Euthymic  Affect:  Appropriate and Congruent  Thought Process:  Coherent, Goal Directed, Linear and Descriptions of Associations: Intact  Orientation:  Full (Time, Place, and Person)  Thought Content:  Focused on meds and discharge plans  Suicidal Thoughts:  No  Homicidal Thoughts:  No  Memory:  Immediate;   Fair Recent;   Fair Remote;   Fair  Judgement:  Good  Insight:  Fair  Psychomotor Activity:  Normal  Concentration:  Concentration: Fair and Attention Span: Fair  Recall:  [employer] of Knowledge:  Fair  Language:  Fair  Akathisia:  No  Handed:    AIMS (if indicated):     Assets:  Communication Skills Desire for Improvement Resilience Social Support  ADL's:  Intact  Cognition:  WNL  Sleep:      Treatment Plan Summary: Adjustment disorder with mixed anxiety and depressed mood stable for outpatient management, no inpatient criteria  Disposition: No evidence of imminent risk to self or others at present.   Patient does not meet criteria for psychiatric inpatient admission. Supportive therapy provided about ongoing stressors. Refer to IOP. Discussed crisis plan, support from social network, calling 911, coming to the Emergency Department, and calling Suicide Hotline.  Benjamine Mola, [place] 12/30/2016 10:25 AM  Patient seen face-to-face for psychiatric evaluation, chart reviewed and case discussed with the physician extender and developed treatment plan. Reviewed the information documented and agree with the treatment plan. Corena Pilgrim, MD

## 2017-01-12 ENCOUNTER — Telehealth: Payer: Self-pay

## 2017-01-12 NOTE — Telephone Encounter (Signed)
Patient notified and will pick up from front desk

## 2017-01-12 NOTE — Telephone Encounter (Signed)
Disability Parking Placard waiting to be sign by Dr. Tamala Julian

## 2017-01-24 ENCOUNTER — Other Ambulatory Visit: Payer: Self-pay | Admitting: Internal Medicine

## 2017-01-24 NOTE — Telephone Encounter (Signed)
Routing to dr burns, please advise, thanks 

## 2017-01-26 NOTE — Telephone Encounter (Signed)
[place] controlled substance database checked.  Ok to fill medication.  

## 2017-01-26 NOTE — Telephone Encounter (Signed)
Called refill into [facility] had to leave on pharmacy vm.../lmb 

## 2017-02-01 ENCOUNTER — Other Ambulatory Visit: Payer: Self-pay | Admitting: Internal Medicine

## 2017-02-03 ENCOUNTER — Other Ambulatory Visit: Payer: Self-pay | Admitting: Internal Medicine

## 2017-02-08 ENCOUNTER — Encounter (INDEPENDENT_AMBULATORY_CARE_PROVIDER_SITE_OTHER): Payer: Self-pay

## 2017-02-08 ENCOUNTER — Ambulatory Visit (INDEPENDENT_AMBULATORY_CARE_PROVIDER_SITE_OTHER): Payer: Medicare Other | Admitting: Neurology

## 2017-02-08 ENCOUNTER — Encounter: Payer: Self-pay | Admitting: Neurology

## 2017-02-08 VITALS — BP 142/86 | HR 88 | Resp 18 | Ht 62.5 in | Wt 199.0 lb

## 2017-02-08 DIAGNOSIS — G479 Sleep disorder, unspecified: Secondary | ICD-10-CM | POA: Diagnosis not present

## 2017-02-08 DIAGNOSIS — R351 Nocturia: Secondary | ICD-10-CM

## 2017-02-08 DIAGNOSIS — R400 Somnolence: Secondary | ICD-10-CM | POA: Diagnosis not present

## 2017-02-08 DIAGNOSIS — F39 Unspecified mood [affective] disorder: Secondary | ICD-10-CM

## 2017-02-08 NOTE — Progress Notes (Signed)
Subjective:    Patient ID: Dawn Kim is a 71 y.o. female.  HPI     History:   Dear Dr. Quay Burow,   I saw your patient, Dawn Kim, upon your kind request in my neurologic clinic today for initial consultation of her sleep disorder. The patient is unaccompanied today. As you know, Dawn Kim is a 71 year old right-handed woman with an underlying medical history of depression, anxiety, recent ER visit on 12/29/16, for exacerbation of mood disorder, but no inpatient behavioral admission, hypothyroidism, reflux disease, knee pain, hypertension, hyperlipidemia, status post left total knee replacement in 2011, osteopenia, edema, and obesity, who reports daytime tiredness, having to take naps, difficulty initiating and more so maintaining sleep, snoring. Her Epworth sleepiness score is 2 out of 24, fatigue score is 59 out of 63. She has never had a sleep study. She is retired, she lives with her husband. She has 3 grown children. She quit smoking in February 2011. She does not drink alcohol or use illicit drugs. She drinks caffeine in the form of An sweet tea and coffee, usually 2 cups of coffee in the mornings and 3-4 glasses of tea throughout the day, maybe as late as 6 PM.  I reviewed your office note from 09/02/16.  For sleep initiation and maintenance issues she has been tried on medication including Ambien which caused side effects including amnesia and hung over feeling, this was started after the L TKA and was stopped about 2 years ago. She has been on Trazodone for several years, currently on 200 mg each night.  She has a BT of around 9 PM, it takes about 1 1/2 hours to fall asleep. She sleeps only up to 4 hours she estimates, will often be awake around 2 AM. She has nocturia about 1/night. She denies RLS symptoms, not sure if she snores. Her husband is 87 years old and snores. They usually don't sleep in the same bedroom.  Her Past Medical History Is Significant For: Past Medical History:   Diagnosis Date  . Anxiety   . Cataract    Bil  . Depression   . Hypercholesteremia   . Hypertension   . Thyroid disease     Her Past Surgical History Is Significant For: Past Surgical History:  Procedure Laterality Date  . CHOLECYSTECTOMY    . COLONOSCOPY    . FOOT SURGERY  2011   right foot  . JOINT REPLACEMENT  2010   left knee  . POLYPECTOMY    . TONSILLECTOMY      Her Family History Is Significant For: Family History  Problem Relation Age of Onset  . Heart disease Mother     Her Social History Is Significant For: Social History   Social History  . Marital status: Married    Spouse name: N/A  . Number of children: 3  . Years of education: 12   Occupational History  . Retired     Social History Main Topics  . Smoking status: Former Smoker    Types: Cigarettes    Quit date: 11/04/2009  . Smokeless tobacco: Never Used  . Alcohol use No  . Drug use: No  . Sexual activity: Not Asked   Other Topics Concern  . None   Social History Narrative   Caffeine:     Her Allergies Are:  Allergies  Allergen Reactions  . Penicillins Hives    Has patient had a PCN reaction causing immediate rash, facial/tongue/throat swelling, SOB or lightheadedness with hypotension: No  Has patient had a PCN reaction causing severe rash involving mucus membranes or skin necrosis: Yes  Has patient had a PCN reaction that required hospitalization: No Has patient had a PCN reaction occurring within the last 10 years: No  If all of the above answers are "NO", then may proceed with Cephalosporin use.   [name] Amoxicillin Rash    Has patient had a PCN reaction causing immediate rash, facial/tongue/throat swelling, SOB or lightheadedness with hypotension: No  Has patient had a PCN reaction causing severe rash involving mucus membranes or skin necrosis: Yes Has patient had a PCN reaction that required hospitalization No Has patient had a PCN reaction occurring within the last 10 years: No If  all of the above answers are "NO", then may proceed with Cephalosporin use.   :   Her Current Medications Are:  Outpatient Encounter Prescriptions as of 02/08/2017  Medication Sig  . ALPRAZolam (XANAX) 0.25 MG tablet TAKE 1 TABLET BY MOUTH THREE TIMES DAILY AS NEEDED  . Biotin 5000 MCG TABS Take 1 tablet by mouth daily.  . calcium carbonate (OS-CAL - DOSED IN MG OF ELEMENTAL CALCIUM) 1250 (500 Ca) MG tablet Take 1 tablet (500 mg of elemental calcium total) by mouth daily.  . cholecalciferol (VITAMIN D) 1000 UNITS tablet Take 1,000 Units by mouth daily.  . diclofenac sodium (VOLTAREN) 1 % GEL APPLY 2 GRAMS TO AFFECTED AREA 2 TIMES A DAY AS NEEDED FOR PAIN  . ferrous sulfate 325 (65 FE) MG tablet Take 325 mg by mouth daily with breakfast.  . fish oil-omega-3 fatty acids 1000 MG capsule Take 1 g by mouth 2 (two) times daily.   [name] gabapentin (NEURONTIN) 300 MG capsule TAKE ONE CAPSULE BY MOUTH IN THE MORNING AND TWO AT BEDTIME  . hydrochlorothiazide (HYDRODIURIL) 25 MG tablet Take 1 tablet (25 mg total) by mouth daily.  [name] levothyroxine (SYNTHROID, LEVOTHROID) 112 MCG tablet Take 1 tablet (112 mcg total) by mouth daily.  . Multiple Vitamin (MULTIVITAMIN) tablet Take 1 tablet by mouth daily.  [name] omeprazole (PRILOSEC) 40 MG capsule TAKE ONE CAPSULE BY MOUTH ONCE DAILY  . ramipril (ALTACE) 10 MG capsule TAKE ONE CAPSULE BY MOUTH ONCE DAILY  . simvastatin (ZOCOR) 40 MG tablet TAKE ONE TABLET BY MOUTH IN THE EVENING  . traZODone (DESYREL) 100 MG tablet TAKE ONE & ONE-HALF TO TWO TABLETS BY MOUTH AT BEDTIME  . venlafaxine XR (EFFEXOR-XR) 150 MG 24 hr capsule TAKE ONE CAPSULE BY MOUTH TWICE DAILY  . [DISCONTINUED] acetaminophen (TYLENOL) 500 MG tablet Take 500 mg by mouth every 6 (six) hours as needed for mild pain.   No facility-administered encounter medications on file as of 02/08/2017.   : Review of Systems:  Out of a complete 14 point review of systems, all are reviewed and negative with the exception  of these symptoms as listed below: Review of Systems  Neurological:       Patient has trouble falling and staying asleep, she states that she sleeps "some" with Trazodone.  Morning fatigue, daytime fatigue, takes naps.    Epworth Sleepiness Scale 0= would never doze 1= slight chance of dozing 2= moderate chance of dozing 3= high chance of dozing  Sitting and reading:0 Watching TV:0 Sitting inactive in a public place (ex. Theater or meeting):0 As a passenger in a car for an hour without a break:0 Lying down to rest in the afternoon:2 Sitting and talking to someone:0 Sitting quietly after lunch (no alcohol):0 In a car, while stopped in  traffic:0 Total:2  Objective:  Neurologic Exam  Physical Exam Physical Examination:   Vitals:   02/08/17 1119  BP: (!) 142/86  Pulse: 88  Resp: 18    General Examination: The patient is a very pleasant 71 y.o. female in no acute distress. She appears well-developed and well-nourished and adequately groomed.   HEENT: Normocephalic, atraumatic, pupils are equal, round and reactive to light and accommodation. Extraocular tracking is good without limitation to gaze excursion or nystagmus noted. Normal smooth pursuit is noted. Hearing is grossly intact. Face is symmetric with normal facial animation and normal facial sensation. Speech is clear with no dysarthria noted. There is no hypophonia. There is no lip, neck/head, jaw or voice tremor. Neck is supple with full range of passive and active motion. There are no carotid bruits on auscultation. Oropharynx exam reveals: mild mouth dryness, adequate dental hygiene and mild airway crowding, due to small airway and longer appearing uvula. Mallampati is class II. Tongue protrudes centrally and palate elevates symmetrically. Tonsils are absent. Neck size is 15 1/8 inches.   Chest: Clear to auscultation without wheezing, rhonchi or crackles noted.  Heart: S1+S2+0, regular and normal without murmurs, rubs or  gallops noted.   Abdomen: Soft, non-tender and non-distended with normal bowel sounds appreciated on auscultation.  Extremities: There is no pitting edema in the distal lower extremities bilaterally. Pedal pulses are intact.  Skin: Warm and dry without trophic changes noted.  Musculoskeletal: exam reveals no obvious joint deformities, tenderness or joint swelling or erythema. She has scars to L knee.   Neurologically:  Mental status: The patient is awake, alert and oriented in all 4 spheres. His immediate and remote memory, attention, language skills and fund of knowledge are appropriate. There is no evidence of aphasia, agnosia, apraxia or anomia. Speech is clear with normal prosody and enunciation. Thought process is linear. Mood is normal and affect is normal.  Cranial nerves II - XII are as described above under HEENT exam. In addition: shoulder shrug is normal with equal shoulder height noted. Motor exam: Normal bulk, strength and tone is noted. There is no drift, tremor or rebound. Romberg is negative, except for mild sway. Reflexes are 1+ throughout, trace in the ankles. Fine motor skills and coordination: intact with normal finger taps, normal hand movements, normal rapid alternating patting, normal foot taps and normal foot agility.  Cerebellar testing: No dysmetria or intention tremor on finger to nose testing. Heel to shin is difficult for her.  Sensory exam: intact to light touch in the upper and lower extremities.  Gait, station and balance: She stands easily. No veering to one side is noted. No leaning to one side is noted. Posture is age-appropriate and stance is narrow based. Gait shows cautious and slow gait, tandem walk is not possible for her.                Assessment and Plan:   In summary, Dawn Kim is a very pleasant 71 y.o.-year old female with an underlying medical history of depression, anxiety, recent ER visit on 12/29/16, for exacerbation of mood disorder, but no  inpatient behavioral admission, hypothyroidism, reflux disease, knee pain, hypertension, hyperlipidemia, status post left total knee replacement in 2011, osteopenia, edema, and obesity, whose history and physical exam are concerning for obstructive sleep apnea (OSA). She has a long-standing history of sleep difficulties, particularly sleep maintenance issues. She may benefit from seeing a therapist or psychiatrist at one point. As I understand, she is not seeing  a counselor at this time. She reports that she has good support from friends and through her church and through praying. We will do a sleep study testing not only to rule out sleep apnea but to look for any other organic underlying sleep disorder that could explain her sleep difficulties. I had a long chat with the patient about my findings and the diagnosis of OSA, its prognosis and treatment options. We talked about medical treatments, surgical interventions and non-pharmacological approaches. I explained in particular the risks and ramifications of untreated moderate to severe OSA, especially with respect to developing cardiovascular disease down the Road, including congestive heart failure, difficult to treat hypertension, cardiac arrhythmias, or stroke. Even type 2 diabetes has, in part, been linked to untreated OSA. Symptoms of untreated OSA include daytime sleepiness, memory problems, mood irritability and mood disorder such as depression and anxiety, lack of energy, as well as recurrent headaches, especially morning headaches. We talked about trying to maintain a healthy lifestyle in general, as well as the importance of weight control. I encouraged the patient to eat healthy, exercise daily and keep well hydrated, to keep a scheduled bedtime and wake time routine, to not skip any meals and eat healthy snacks in between meals. I advised the patient not to drive when feeling sleepy. I recommended the following at this time: sleep study with  potential positive airway pressure titration. (We will score hypopneas at 4%).   I explained the sleep test procedure to the patient and also outlined possible surgical and non-surgical treatment options of OSA, including the use of a custom-made dental device (which would require a referral to a specialist dentist or oral surgeon), upper airway surgical options, such as pillar implants, radiofrequency surgery, tongue base surgery, and UPPP (which would involve a referral to an ENT surgeon). Rarely, jaw surgery such as mandibular advancement may be considered.  I also explained the CPAP treatment option to the patient, who indicated that she would be willing to try CPAP if the need arises. I explained the importance of being compliant with PAP treatment, not only for insurance purposes but primarily to improve Her symptoms, and for the patient's long term health benefit, including to reduce Her cardiovascular risks. I answered all her questions today and the patient was in agreement. I would like to see her back after the sleep study is completed and encouraged her to call with any interim questions, concerns, problems or updates.   Thank you very much for allowing me to participate in the care of this nice patient. If I can be of any further assistance to you please do not hesitate to call me at [phone].  Sincerely,   Star Age, MD, PhD

## 2017-02-08 NOTE — Patient Instructions (Signed)

## 2017-02-09 ENCOUNTER — Other Ambulatory Visit: Payer: Self-pay | Admitting: Internal Medicine

## 2017-02-12 ENCOUNTER — Other Ambulatory Visit: Payer: Self-pay | Admitting: Internal Medicine

## 2017-02-14 DIAGNOSIS — H04123 Dry eye syndrome of bilateral lacrimal glands: Secondary | ICD-10-CM | POA: Diagnosis not present

## 2017-02-14 DIAGNOSIS — H43813 Vitreous degeneration, bilateral: Secondary | ICD-10-CM | POA: Diagnosis not present

## 2017-02-16 ENCOUNTER — Ambulatory Visit (INDEPENDENT_AMBULATORY_CARE_PROVIDER_SITE_OTHER): Payer: Medicare Other | Admitting: Neurology

## 2017-02-16 DIAGNOSIS — G4720 Circadian rhythm sleep disorder, unspecified type: Secondary | ICD-10-CM

## 2017-02-16 DIAGNOSIS — G478 Other sleep disorders: Secondary | ICD-10-CM

## 2017-02-16 DIAGNOSIS — R0683 Snoring: Secondary | ICD-10-CM

## 2017-02-17 ENCOUNTER — Other Ambulatory Visit: Payer: Self-pay | Admitting: Internal Medicine

## 2017-02-17 NOTE — Telephone Encounter (Signed)
Not due - refuse

## 2017-02-17 NOTE — Telephone Encounter (Signed)
Lease advise, last fill 12/06/16 for #90 day supply.

## 2017-02-21 ENCOUNTER — Other Ambulatory Visit: Payer: Self-pay | Admitting: Internal Medicine

## 2017-02-22 ENCOUNTER — Telehealth: Payer: Self-pay | Admitting: Internal Medicine

## 2017-02-22 MED ORDER — ALPRAZOLAM 0.25 MG PO TABS: 0.2500 mg | ORAL_TABLET | Freq: Three times a day (TID) | ORAL | 0 refills | Status: DC | PRN

## 2017-02-22 NOTE — Telephone Encounter (Signed)
RX faxed to [facility] 

## 2017-02-22 NOTE — Telephone Encounter (Signed)
Pt would like a refill of ALPRAZolam (XANAX) 0.25 MG tablet  [facility] on [address]

## 2017-02-24 ENCOUNTER — Telehealth: Payer: Self-pay

## 2017-02-24 NOTE — Telephone Encounter (Signed)
LM (per DPR) with results and recommendations below. I asked her to call back and make f/u appt with Dr. Rexene Alberts. I will send report to PCP.

## 2017-02-24 NOTE — Progress Notes (Signed)
Patient referred by Dr. Quay Burow, seen by me on 02/08/17, diagnostic PSG on 02/16/17.   Please call and notify the patient that the recent sleep study did not show any significant obstructive sleep apnea. Please inform patient that I would like to go over the details of the study during a follow up appointment. Arrange a followup appointment. Also, route or fax report to PCP and referring MD, if other than PCP.  Once you have spoken to patient, you can close this encounter.   Thanks,  Star Age, MD, PhD [facility])

## 2017-02-24 NOTE — Telephone Encounter (Signed)
-----   Message from Star Age, MD sent at 02/24/2017  8:23 AM EDT ----- Patient referred by Dr. Quay Burow, seen by me on 02/08/17, diagnostic PSG on 02/16/17.   Please call and notify the patient that the recent sleep study did not show any significant obstructive sleep apnea. Please inform patient that I would like to go over the details of the study during a follow up appointment. Arrange a followup appointment. Also, route or fax report to PCP and referring MD, if other than PCP.  Once you have spoken to patient, you can close this encounter.   Thanks,  Star Age, MD, PhD [facility])

## 2017-02-24 NOTE — Procedures (Signed)
PATIENT'S NAME:  Dawn Kim, Dawn Kim DOB:      06/02/1900      MR#:    [id number]     DATE OF RECORDING: 02/16/2017 REFERRING M.D.:  Billey Gosling, MD Study Performed:   Baseline Polysomnogram HISTORY: 71 year old woman with an underlying history of depression, anxiety, recent ER visit on 12/29/16, for exacerbation of mood disorder, but no inpatient behavioral admission, hypothyroidism, reflux disease, knee pain, hypertension, hyperlipidemia, status post left total knee replacement in 2011, osteopenia, edema, and obesity, who reports daytime tiredness, having to take naps, difficulty initiating and more so maintaining sleep, snoring. Her Epworth sleepiness score is 2 out of 24, fatigue score is 59 out of 63. She has never had a sleep study. The patient's weight 199 pounds with a height of 62.5 (inches), resulting in a BMI of 36.5 kg/m2. The patient's neck circumference measured 16 inches.  CURRENT MEDICATIONS: Xanax, Biotin, Os-Cal, Vitamin D, Voltaren, Iron, Fish oil, Neurontin, HCTZ, Synthroid, Multivitamin, Prilosec, Altace, Zocor, Desyrel, Effexor XR   PROCEDURE:  This is a multichannel digital polysomnogram utilizing the Somnostar 11.2 system.  Electrodes and sensors were applied and monitored per AASM Specifications.   EEG, EOG, Chin and Limb EMG, were sampled at 200 Hz.  ECG, Snore and Nasal Pressure, Thermal Airflow, Respiratory Effort, CPAP Flow and Pressure, Oximetry was sampled at 50 Hz. Digital video and audio were recorded.      BASELINE STUDY  Lights Out was at 20:46 and Lights On at 05:02.  Total recording time (TRT) was 496.5 minutes, with a total sleep time (TST) of  287 minutes.   The patient's sleep latency was 39 minutes, which is delayed. REM sleep was absent. The sleep efficiency was 57.8 %, which is reduced.     SLEEP ARCHITECTURE: WASO (Wake after sleep onset) was 170.5 minutes with a longer period of wakefulness after 02:30 AM, patient was noted to cry and reported, that this is  typical for her and denied being upset at anything in particular, no pain was reported, not unhappy about being in the sleep lab, reported, having a lot on her mind. There were 5.5 minutes in Stage N1, 112.5 minutes Stage N2, 169 minutes Stage N3 and 0 minutes in Stage REM.  The percentage of Stage N1 was 1.9%, Stage N2 was 39.2%, Stage N3 was 58.9%, which is increased, and Stage R (REM sleep) was absent.   The arousals were noted as: 71 were spontaneous, 0 were associated with PLMs, 16 were associated with respiratory events.    Audio and video analysis did not show any abnormal or unusual movements, behaviors, phonations or vocalizations, other than above mentioned.  The patient took 1 bathroom break and had difficulty going back to sleep after that. Intermittent mild to at times moderate snoring was noted. The EKG was in keeping with normal sinus rhythm (NSR).  RESPIRATORY ANALYSIS:  There were a total of 16 respiratory events:  5 obstructive apneas, 0 central apneas and 0 mixed apneas with a total of 5 apneas and an apnea index (AI) of 1. /hour. There were 11 hypopneas with a hypopnea index of 2.3 /hour. The patient also had 0 respiratory event related arousals (RERAs).      The total APNEA/HYPOPNEA INDEX (AHI) was 3.3/hour and the total RESPIRATORY DISTURBANCE INDEX was 3.3 /hour.  0 events occurred in REM sleep and 22 events in NREM. The REM AHI was 0 /hour, versus a non-REM AHI of 3.3. The patient spent 0 minutes of total sleep  time in the supine position and 287 minutes in non-supine.. The supine AHI was 0.0 versus a non-supine AHI of 3.3.  OXYGEN SATURATION & C02:  The Wake baseline 02 saturation was 96%, with the lowest being 86%. Time spent below 89% saturation equaled 5 minutes.  PERIODIC LIMB MOVEMENTS: The patient had a total of 0 Periodic Limb Movements.  The Periodic Limb Movement (PLM) index was 0 and the PLM Arousal index was 0/hour.  Post-study, the patient indicated that sleep was  the same as usual.   IMPRESSION:  1. Primary Snoring 2. Dysfunctions associated with sleep stages or arousal from sleep  RECOMMENDATIONS:  1. This study does not demonstrate any significant obstructive or central sleep disordered breathing. Of note, the absence of supine sleep and REM sleep may underestimate her AHI and O2 nadir. Intermittent snoring was noted; for this, weight loss may help.  2. This study shows sleep fragmentation and abnormal sleep stage percentages; these are nonspecific findings and per se do not signify an intrinsic sleep disorder or a cause for the patient's sleep-related symptoms. Causes include (but are not limited to) the first night effect of the sleep study, circadian rhythm disturbances, medication effect or an underlying mood disorder or medical problem. The patient was crying in the middle of the night, denied any specific problem. She reported, it was typical for her and reported having a lot on her mind. She will likely benefit from evaluation and ongoing management of her mood disorder by behavioral health, including psychiatrist and counselor.  3. The patient should be cautioned not to drive, work at heights, or operate dangerous or heavy equipment when tired or sleepy. Review and reiteration of good sleep hygiene measures should be pursued with any patient. 4. The patient will be seen in follow-up by Dr. Rexene Alberts at [facility] for discussion of the test results and further management strategies. The referring provider will be notified of the test results.  I certify that I have reviewed the entire raw data recording prior to the issuance of this report in accordance with the Standards of Accreditation of the American Academy of Sleep Medicine (AASM)   Star Age, MD, PhD Diplomat, American Board of Psychiatry and Neurology (Neurology and Sleep Medicine)

## 2017-02-25 ENCOUNTER — Telehealth: Payer: Self-pay | Admitting: Internal Medicine

## 2017-02-25 ENCOUNTER — Telehealth: Payer: Self-pay | Admitting: Family Medicine

## 2017-02-25 NOTE — Telephone Encounter (Signed)
She can call on her own.  If they need a referral would need it from PCP and I likely think it is inappropriate with her not doing the other work up or if she has not seen orthopaedics for further evaluation of the knee.

## 2017-02-25 NOTE — Telephone Encounter (Signed)
Error

## 2017-02-25 NOTE — Telephone Encounter (Signed)
Patient would like a referral for her knee to the pain clinic at [facility]. She did not have any more information about it. Hoping you know what she is talking about. Thank you.

## 2017-02-28 NOTE — Progress Notes (Signed)
Subjective:    Patient ID: Dawn Kim, female    DOB: [birth year], 71 y.o.   MRN: [id number]  HPI The patient is here for follow up.  Insomnia:  She is taking trazodone nightly.  She always takes two pills at night.  She sleeps ok with the medication.    Anxiety: She has been on effexor for years and has not wanted to switch it in the past.  She is taking xanax during the day and has taken more than prescribed - some days she does take 4 pills/day.  Her anxiety is not controlled and she wonders about increasing the xanax to a higher dose.  She can not afford to see a therapist or psychiatrist - it is not covered by her insurance.      Hypertension: She is taking her medication daily. She is compliant with a low sodium diet.  She denies chest pain, palpitations, edema, shortness of breath and regular headaches. She is not exercising regularly.     Hypothyroidism:  She is taking her medication daily.  She denies any recent changes in energy or weight that are unexplained.   GERD:  She is taking her medication daily as prescribed.  She denies any GERD symptoms and feels her GERD is well controlled.    Medications and allergies reviewed with patient and updated if appropriate.  Patient Active Problem List   Diagnosis Date Noted  . Adjustment disorder with mixed anxiety and depressed mood 12/30/2016  . 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 Stye external 06/21/2016  . Osteopenia 06/21/2016  . Lower leg edema 05/31/2016  . Hypothyroidism 10/22/2015  . Depression 10/22/2015  . Anxiety 10/22/2015  . GERD (gastroesophageal reflux disease) 10/22/2015  . Insomnia 10/22/2015  . Foot pain, right 10/22/2015  . Bilateral knee pain 10/22/2015  . HTN (hypertension) 01/20/2012  . Lipid disorder 01/20/2012    Current Outpatient Prescriptions on File Prior to Visit  Medication Sig Dispense Refill  . Biotin 5000 MCG TABS Take 1 tablet by mouth daily.    . calcium  carbonate (OS-CAL - DOSED IN MG OF ELEMENTAL CALCIUM) 1250 (500 Ca) MG tablet Take 1 tablet (500 mg of elemental calcium total) by mouth daily.    . cholecalciferol (VITAMIN D) 1000 UNITS tablet Take 1,000 Units by mouth daily.    . diclofenac sodium (VOLTAREN) 1 % GEL APPLY 2 GRAMS TO AFFECTED AREA 2 TIMES A DAY AS NEEDED FOR PAIN 300 g 1  . ferrous sulfate 325 (65 FE) MG tablet Take 325 mg by mouth daily with breakfast.    . fish oil-omega-3 fatty acids 1000 MG capsule Take 1 g by mouth 2 (two) times daily.     . hydrochlorothiazide (HYDRODIURIL) 25 MG tablet TAKE ONE TABLET BY MOUTH ONCE DAILY 90 tablet 0  . levothyroxine (SYNTHROID, LEVOTHROID) 112 MCG tablet Take 1 tablet (112 mcg total) by mouth daily. 90 tablet 3  . Multiple Vitamin (MULTIVITAMIN) tablet Take 1 tablet by mouth daily.    [name] omeprazole (PRILOSEC) 40 MG capsule TAKE ONE CAPSULE BY MOUTH ONCE DAILY 90 capsule 3  . ramipril (ALTACE) 10 MG capsule TAKE ONE CAPSULE BY MOUTH ONCE DAILY 90 capsule 3  . simvastatin (ZOCOR) 40 MG tablet TAKE ONE TABLET BY MOUTH IN THE EVENING 90 tablet 3  . traZODone (DESYREL) 100 MG tablet TAKE ONE & ONE-HALF TO TWO TABLETS BY MOUTH AT BEDTIME 60 tablet 1  . venlafaxine XR (EFFEXOR-XR) 150  MG 24 hr capsule TAKE ONE CAPSULE BY MOUTH TWICE DAILY 180 capsule 1   No current facility-administered medications on file prior to visit.     Past Medical History:  Diagnosis Date  . Anxiety   . Cataract    Bil  . Depression   . Hypercholesteremia   . Hypertension   . Thyroid disease     Past Surgical History:  Procedure Laterality Date  . CHOLECYSTECTOMY    . COLONOSCOPY    . FOOT SURGERY  2011   right foot  . JOINT REPLACEMENT  2010   left knee  . POLYPECTOMY    . TONSILLECTOMY      Social History   Social History  . Marital status: Married    Spouse name: N/A  . Number of children: 3  . Years of education: 12   Occupational History  . Retired     Social History Main Topics  .  Smoking status: Former Smoker    Types: Cigarettes    Quit date: 11/04/2009  . Smokeless tobacco: Never Used  . Alcohol use No  . Drug use: No  . Sexual activity: Not on file   Other Topics Concern  . Not on file   Social History Narrative   Caffeine:     Family History  Problem Relation Age of Onset  . Heart disease Mother     Review of Systems  Constitutional: Negative for chills and fever.  Respiratory: Negative for shortness of breath and wheezing.   Cardiovascular: Negative for chest pain, palpitations and leg swelling.  Neurological: Negative for light-headedness and headaches.       Objective:   Vitals:   03/01/17 1102  BP: 122/72  Pulse: 68  Resp: 16  Temp: 97.7 F (36.5 C)   Wt Readings from Last 3 Encounters:  03/01/17 201 lb (91.2 kg)  02/08/17 199 lb (90.3 kg)  12/09/16 207 lb (93.9 kg)   Body mass index is 36.18 kg/m.   Physical Exam    Constitutional: Appears well-developed and well-nourished. No distress.  HENT:  Head: Normocephalic and atraumatic.  Neck: Neck supple. No tracheal deviation present. No thyromegaly present.  No cervical lymphadenopathy Cardiovascular: Normal rate, regular rhythm and normal heart sounds.   No murmur heard. No carotid bruit .  No edema Pulmonary/Chest: Effort normal and breath sounds normal. No respiratory distress. No has no wheezes. No rales.  Skin: Skin is warm and dry. Not diaphoretic.  Psychiatric: Normal mood and affect. Behavior is normal.      Assessment & Plan:    See Problem List for Assessment and Plan of chronic medical problems.

## 2017-03-01 ENCOUNTER — Encounter: Payer: Self-pay | Admitting: Internal Medicine

## 2017-03-01 ENCOUNTER — Ambulatory Visit (INDEPENDENT_AMBULATORY_CARE_PROVIDER_SITE_OTHER): Payer: Medicare Other | Admitting: Internal Medicine

## 2017-03-01 ENCOUNTER — Other Ambulatory Visit (INDEPENDENT_AMBULATORY_CARE_PROVIDER_SITE_OTHER): Payer: Medicare Other

## 2017-03-01 VITALS — BP 122/72 | HR 68 | Temp 97.7°F | Resp 16 | Wt 201.0 lb

## 2017-03-01 DIAGNOSIS — I10 Essential (primary) hypertension: Secondary | ICD-10-CM

## 2017-03-01 DIAGNOSIS — K219 Gastro-esophageal reflux disease without esophagitis: Secondary | ICD-10-CM

## 2017-03-01 DIAGNOSIS — F419 Anxiety disorder, unspecified: Secondary | ICD-10-CM

## 2017-03-01 DIAGNOSIS — E038 Other specified hypothyroidism: Secondary | ICD-10-CM

## 2017-03-01 DIAGNOSIS — G4700 Insomnia, unspecified: Secondary | ICD-10-CM | POA: Diagnosis not present

## 2017-03-01 DIAGNOSIS — Z96652 Presence of left artificial knee joint: Secondary | ICD-10-CM

## 2017-03-01 DIAGNOSIS — T8484XD Pain due to internal orthopedic prosthetic devices, implants and grafts, subsequent encounter: Secondary | ICD-10-CM | POA: Diagnosis not present

## 2017-03-01 LAB — COMPREHENSIVE METABOLIC PANEL
ALBUMIN: 4.4 g/dL (ref 3.5–5.2)
ALK PHOS: 37 U/L — AB (ref 39–117)
ALT: 19 U/L (ref 0–35)
AST: 20 U/L (ref 0–37)
BILIRUBIN TOTAL: 0.7 mg/dL (ref 0.2–1.2)
BUN: 17 mg/dL (ref 6–23)
CO2: 32 mEq/L (ref 19–32)
Calcium: 9.4 mg/dL (ref 8.4–10.5)
Chloride: 101 mEq/L (ref 96–112)
Creatinine, Ser: 1 mg/dL (ref 0.40–1.20)
GFR: 57.8 mL/min — AB (ref >60.00)
Glucose, Bld: 91 mg/dL (ref 70–99)
Potassium: 3.9 mEq/L (ref 3.5–5.1)
Sodium: 142 mEq/L (ref 135–145)
TOTAL PROTEIN: 6.8 g/dL (ref 6.0–8.3)

## 2017-03-01 LAB — TSH: TSH: 1.12 u[IU]/mL (ref 0.35–4.50)

## 2017-03-01 MED ORDER — GABAPENTIN 300 MG PO CAPS: 600.0000 mg | ORAL_CAPSULE | Freq: Two times a day (BID) | ORAL | 5 refills | Status: DC

## 2017-03-01 MED ORDER — ALPRAZOLAM 0.5 MG PO TABS: 0.5000 mg | ORAL_TABLET | Freq: Three times a day (TID) | ORAL | 2 refills | Status: DC

## 2017-03-01 MED ORDER — ZOSTER VAC RECOMB ADJUVANTED 50 MCG/0.5ML IM SUSR: 0.5000 mL | Freq: Once | INTRAMUSCULAR | 1 refills | Status: AC

## 2017-03-01 MED ORDER — ZOSTER VAC RECOMB ADJUVANTED 50 MCG/0.5ML IM SUSR: 0.5000 mL | Freq: Once | INTRAMUSCULAR | 1 refills | Status: DC

## 2017-03-01 NOTE — Assessment & Plan Note (Signed)
BP well controlled Current regimen effective and well tolerated Continue current medications at current doses  

## 2017-03-01 NOTE — Assessment & Plan Note (Signed)
Not controlled Taking 3-4 xanax a day Will increase xanax to 0.5 mg TID -advised her she can not take more than prescribed Continue effexor - she does not want to change this Unable to see psych or therapy due to insurance not covering it

## 2017-03-01 NOTE — Progress Notes (Signed)
Resent vaccine 1st transmission failed...lmb

## 2017-03-01 NOTE — Telephone Encounter (Signed)
Spoke to pt, Dr. Quay Burow has entered a referral.

## 2017-03-01 NOTE — Progress Notes (Signed)
Resent Gabapentin 1st transmission failed...Dawn Kim

## 2017-03-01 NOTE — Patient Instructions (Addendum)
  Test(s) ordered today. Your results will be released to [place] (or called to you) after review, usually within 72hours after test completion. If any changes need to be made, you will be notified at that same time.   Medications reviewed and updated.  Changes include increasing the xanax to 0.5 mg three times daily.  We will increase the gabapentin to 2 pills twice daily.   Your prescription(s) have been submitted to your pharmacy. Please take as directed and contact our office if you believe you are having problem(s) with the medication(s).  A referral was ordered for pain clinic.   Please followup in 6 months

## 2017-03-01 NOTE — Assessment & Plan Note (Signed)
Trazodone 200 mg nightly

## 2017-03-01 NOTE — Assessment & Plan Note (Signed)
Uncontrolled pain  Taking tylenol or aleve Has seen Dr Tamala Julian Wants to see pain management - will refer today

## 2017-03-01 NOTE — Assessment & Plan Note (Signed)
GERD controlled Continue daily medication  

## 2017-03-01 NOTE — Assessment & Plan Note (Signed)
Check tsh  Titrate med dose if needed  

## 2017-03-02 ENCOUNTER — Encounter: Payer: Self-pay | Admitting: Internal Medicine

## 2017-03-02 DIAGNOSIS — Z01419 Encounter for gynecological examination (general) (routine) without abnormal findings: Secondary | ICD-10-CM | POA: Diagnosis not present

## 2017-03-02 DIAGNOSIS — Z780 Asymptomatic menopausal state: Secondary | ICD-10-CM | POA: Diagnosis not present

## 2017-03-02 DIAGNOSIS — N6321 Unspecified lump in the left breast, upper outer quadrant: Secondary | ICD-10-CM | POA: Diagnosis not present

## 2017-03-02 DIAGNOSIS — Z6836 Body mass index (BMI) 36.0-36.9, adult: Secondary | ICD-10-CM | POA: Diagnosis not present

## 2017-03-03 ENCOUNTER — Other Ambulatory Visit: Payer: Self-pay | Admitting: Gynecology

## 2017-03-03 DIAGNOSIS — N63 Unspecified lump in unspecified breast: Secondary | ICD-10-CM

## 2017-03-04 ENCOUNTER — Telehealth: Payer: Self-pay | Admitting: Internal Medicine

## 2017-03-04 MED ORDER — GABAPENTIN 300 MG PO CAPS: 600.0000 mg | ORAL_CAPSULE | Freq: Two times a day (BID) | ORAL | 5 refills | Status: DC

## 2017-03-04 MED ORDER — ZOSTER VAC RECOMB ADJUVANTED 50 MCG/0.5ML IM SUSR: 0.5000 mL | Freq: Once | INTRAMUSCULAR | 1 refills | Status: AC

## 2017-03-04 NOTE — Telephone Encounter (Signed)
Pt called requesting a refill on her Gabapentin 300mg . She said that she is taking 4 a day. She is also asking for a prescription to be sent for the [facility] Vaccine. They both need to be sent to [facility] on [address].

## 2017-03-04 NOTE — Telephone Encounter (Signed)
sent 

## 2017-03-14 ENCOUNTER — Ambulatory Visit
Admission: RE | Admit: 2017-03-14 | Discharge: 2017-03-14 | Disposition: A | Discharge status: Home / Self Care | Payer: Medicare Other | Source: Ambulatory Visit | Attending: Gynecology | Admitting: Gynecology

## 2017-03-14 ENCOUNTER — Telehealth: Payer: Self-pay | Admitting: Internal Medicine

## 2017-03-14 DIAGNOSIS — N63 Unspecified lump in unspecified breast: Secondary | ICD-10-CM

## 2017-03-14 DIAGNOSIS — R928 Other abnormal and inconclusive findings on diagnostic imaging of breast: Secondary | ICD-10-CM | POA: Diagnosis not present

## 2017-03-14 DIAGNOSIS — N6489 Other specified disorders of breast: Secondary | ICD-10-CM | POA: Diagnosis not present

## 2017-03-14 MED ORDER — DICLOFENAC SODIUM 1 % TD GEL: TRANSDERMAL | 1 refills | Status: DC

## 2017-03-14 MED ORDER — ALPRAZOLAM 0.5 MG PO TABS: 0.5000 mg | ORAL_TABLET | Freq: Three times a day (TID) | ORAL | 0 refills | Status: DC

## 2017-03-14 NOTE — Telephone Encounter (Signed)
Spoke with pharmacy to give verbal for RX. Verified that RX was not received on 03/01/17

## 2017-03-14 NOTE — Telephone Encounter (Signed)
Please resend Xanax to [facility] on [address] in [place],   Also needs refill of diclofenac sodium (VOLTAREN) 1 % GEL Same pharmacy

## 2017-03-14 NOTE — Telephone Encounter (Addendum)
I called in xanax to [facility] on [address]. Only thing left is the Voltaren.  Thanks!

## 2017-03-24 DIAGNOSIS — Z79891 Long term (current) use of opiate analgesic: Secondary | ICD-10-CM | POA: Diagnosis not present

## 2017-03-24 DIAGNOSIS — M179 Osteoarthritis of knee, unspecified: Secondary | ICD-10-CM | POA: Diagnosis not present

## 2017-03-24 DIAGNOSIS — M25569 Pain in unspecified knee: Secondary | ICD-10-CM | POA: Diagnosis not present

## 2017-03-24 DIAGNOSIS — G894 Chronic pain syndrome: Secondary | ICD-10-CM | POA: Diagnosis not present

## 2017-03-24 DIAGNOSIS — Z79899 Other long term (current) drug therapy: Secondary | ICD-10-CM | POA: Diagnosis not present

## 2017-03-24 DIAGNOSIS — T8484XA Pain due to internal orthopedic prosthetic devices, implants and grafts, initial encounter: Secondary | ICD-10-CM | POA: Diagnosis not present

## 2017-03-31 DIAGNOSIS — T8484XA Pain due to internal orthopedic prosthetic devices, implants and grafts, initial encounter: Secondary | ICD-10-CM | POA: Diagnosis not present

## 2017-03-31 DIAGNOSIS — M179 Osteoarthritis of knee, unspecified: Secondary | ICD-10-CM | POA: Diagnosis not present

## 2017-04-05 ENCOUNTER — Other Ambulatory Visit: Payer: Self-pay | Admitting: Internal Medicine

## 2017-04-05 NOTE — Telephone Encounter (Signed)
Nothing pulls up in controlled substance database for pt, please advise.

## 2017-05-02 ENCOUNTER — Encounter: Payer: Medicare Other | Admitting: Internal Medicine

## 2017-05-02 DIAGNOSIS — Z79891 Long term (current) use of opiate analgesic: Secondary | ICD-10-CM | POA: Diagnosis not present

## 2017-05-02 DIAGNOSIS — M1710 Unilateral primary osteoarthritis, unspecified knee: Secondary | ICD-10-CM | POA: Diagnosis not present

## 2017-05-02 DIAGNOSIS — M25569 Pain in unspecified knee: Secondary | ICD-10-CM | POA: Diagnosis not present

## 2017-05-02 DIAGNOSIS — Z79899 Other long term (current) drug therapy: Secondary | ICD-10-CM | POA: Diagnosis not present

## 2017-05-02 DIAGNOSIS — G894 Chronic pain syndrome: Secondary | ICD-10-CM | POA: Diagnosis not present

## 2017-05-17 ENCOUNTER — Other Ambulatory Visit: Payer: Self-pay | Admitting: Internal Medicine

## 2017-05-30 ENCOUNTER — Telehealth: Payer: Self-pay | Admitting: Internal Medicine

## 2017-05-30 DIAGNOSIS — M1710 Unilateral primary osteoarthritis, unspecified knee: Secondary | ICD-10-CM | POA: Diagnosis not present

## 2017-05-30 DIAGNOSIS — Z79891 Long term (current) use of opiate analgesic: Secondary | ICD-10-CM | POA: Diagnosis not present

## 2017-05-30 DIAGNOSIS — G894 Chronic pain syndrome: Secondary | ICD-10-CM | POA: Diagnosis not present

## 2017-05-30 DIAGNOSIS — Z79899 Other long term (current) drug therapy: Secondary | ICD-10-CM | POA: Diagnosis not present

## 2017-05-30 DIAGNOSIS — M25569 Pain in unspecified knee: Secondary | ICD-10-CM | POA: Diagnosis not present

## 2017-05-30 NOTE — Telephone Encounter (Signed)
gabapentin (NEURONTIN) 300 MG capsule   Patient is requesting that this medication be up. The jumping in her legs at night is making it hard for her to sleep. Please advise if patient needs appointment to discuss. Thank you.

## 2017-05-30 NOTE — Telephone Encounter (Signed)
Currently taking 600 mg twice daily. Can increase night time dose by 100-200 mg - additional script pending for 100 mg pills - she can add this to the night and see if it helps.

## 2017-05-31 MED ORDER — GABAPENTIN 100 MG PO CAPS: ORAL_CAPSULE | ORAL | 5 refills | Status: DC

## 2017-05-31 NOTE — Telephone Encounter (Signed)
RX sent

## 2017-05-31 NOTE — Telephone Encounter (Signed)
Patient informed. 

## 2017-05-31 NOTE — Telephone Encounter (Signed)
Patient called back to follow up on this. She is okay with trying this for a week or so and seeing if this helps.

## 2017-06-03 NOTE — Telephone Encounter (Signed)
Can this note be closed please?

## 2017-06-06 ENCOUNTER — Other Ambulatory Visit: Payer: Self-pay | Admitting: Internal Medicine

## 2017-06-07 NOTE — Telephone Encounter (Signed)
Ok printed

## 2017-06-07 NOTE — Telephone Encounter (Signed)
RX faxed to local [facility] 

## 2017-06-07 NOTE — Telephone Encounter (Signed)
[place] Controlled Substance Database checked. Last filled on 05/08/17

## 2017-06-29 ENCOUNTER — Ambulatory Visit
Admission: RE | Admit: 2017-06-29 | Discharge: 2017-06-29 | Disposition: A | Discharge status: Home / Self Care | Payer: Medicare Other | Source: Ambulatory Visit | Attending: Physician Assistant | Admitting: Physician Assistant

## 2017-06-29 ENCOUNTER — Other Ambulatory Visit: Payer: Self-pay | Admitting: Physician Assistant

## 2017-06-29 DIAGNOSIS — M545 Low back pain: Principal | ICD-10-CM

## 2017-06-29 DIAGNOSIS — M1710 Unilateral primary osteoarthritis, unspecified knee: Secondary | ICD-10-CM | POA: Diagnosis not present

## 2017-06-29 DIAGNOSIS — G8929 Other chronic pain: Secondary | ICD-10-CM

## 2017-06-29 DIAGNOSIS — M25569 Pain in unspecified knee: Secondary | ICD-10-CM | POA: Diagnosis not present

## 2017-06-29 DIAGNOSIS — G894 Chronic pain syndrome: Secondary | ICD-10-CM | POA: Diagnosis not present

## 2017-06-29 DIAGNOSIS — M48061 Spinal stenosis, lumbar region without neurogenic claudication: Secondary | ICD-10-CM | POA: Diagnosis not present

## 2017-06-30 ENCOUNTER — Ambulatory Visit: Payer: Self-pay | Admitting: Family Medicine

## 2017-07-05 ENCOUNTER — Ambulatory Visit: Payer: Self-pay

## 2017-07-25 ENCOUNTER — Other Ambulatory Visit: Payer: Self-pay | Admitting: Internal Medicine

## 2017-08-02 ENCOUNTER — Telehealth: Payer: Self-pay | Admitting: Internal Medicine

## 2017-08-02 MED ORDER — ALPRAZOLAM 0.5 MG PO TABS: ORAL_TABLET | ORAL | 0 refills | Status: DC

## 2017-08-02 NOTE — Telephone Encounter (Signed)
Ok to fill 

## 2017-08-02 NOTE — Telephone Encounter (Signed)
Check [place] registry last filled 07/06/2017 pls advise...Dawn Kim

## 2017-08-02 NOTE — Telephone Encounter (Signed)
Pt called in and is going to need a refill on her ALPRAZolam Duanne Moron) 0.5 MG tablet [[id number]]    [facility] on [address]

## 2017-08-02 NOTE — Telephone Encounter (Signed)
Notified pt refill has been approved and called into [facility]. Had to leave on pharmacy vm...Dawn Kim

## 2017-08-03 DIAGNOSIS — Z79891 Long term (current) use of opiate analgesic: Secondary | ICD-10-CM | POA: Diagnosis not present

## 2017-08-03 DIAGNOSIS — M47817 Spondylosis without myelopathy or radiculopathy, lumbosacral region: Secondary | ICD-10-CM | POA: Diagnosis not present

## 2017-08-03 DIAGNOSIS — Z79899 Other long term (current) drug therapy: Secondary | ICD-10-CM | POA: Diagnosis not present

## 2017-08-03 DIAGNOSIS — M25569 Pain in unspecified knee: Secondary | ICD-10-CM | POA: Diagnosis not present

## 2017-08-03 DIAGNOSIS — M1710 Unilateral primary osteoarthritis, unspecified knee: Secondary | ICD-10-CM | POA: Diagnosis not present

## 2017-08-03 DIAGNOSIS — G894 Chronic pain syndrome: Secondary | ICD-10-CM | POA: Diagnosis not present

## 2017-08-08 ENCOUNTER — Other Ambulatory Visit: Payer: Self-pay | Admitting: Internal Medicine

## 2017-08-09 ENCOUNTER — Telehealth: Payer: Self-pay | Admitting: Physical Therapy

## 2017-08-09 NOTE — Telephone Encounter (Signed)
08/09/17 pt [facility] PT eval, does not want to reschedule

## 2017-08-19 ENCOUNTER — Ambulatory Visit: Payer: Medicare Other | Admitting: Physical Therapy

## 2017-09-01 ENCOUNTER — Telehealth: Payer: Self-pay | Admitting: Internal Medicine

## 2017-09-01 MED ORDER — ALPRAZOLAM 0.5 MG PO TABS: ORAL_TABLET | ORAL | 0 refills | Status: DC

## 2017-09-01 NOTE — Telephone Encounter (Signed)
Check [place] registry last filled 08/02/2017.Marland KitchenJohny Chess

## 2017-09-01 NOTE — Telephone Encounter (Signed)
rx sent but she needs f/u appt -  Needs to be seen every 6 months

## 2017-09-01 NOTE — Telephone Encounter (Signed)
Copied from [place]. Topic: Quick Communication - See Telephone Encounter >> Sep 01, 2017  8:15 AM Yvette Rack wrote: CRM for notification. See Telephone encounter for: med refill on xanax 0.5 mg send to [facility] [place] 09/01/17.

## 2017-09-01 NOTE — Telephone Encounter (Signed)
Rx request 

## 2017-09-02 ENCOUNTER — Encounter: Payer: Self-pay | Admitting: Internal Medicine

## 2017-09-02 ENCOUNTER — Ambulatory Visit: Payer: Self-pay | Admitting: Family Medicine

## 2017-09-02 NOTE — Telephone Encounter (Signed)
Noted  

## 2017-09-02 NOTE — Telephone Encounter (Signed)
Please contact pt to schedule follow-up. See MDs message below.

## 2017-09-02 NOTE — Telephone Encounter (Signed)
Called patient and advised her of refill sent in and tried to get her to make an appointment, states she has to get someone to bring her and I advised as soon as she knows when someone can bring her to call and sch an appointment because she is due, she understood and will call back

## 2017-09-05 ENCOUNTER — Ambulatory Visit: Payer: Self-pay

## 2017-09-05 DIAGNOSIS — M545 Low back pain: Secondary | ICD-10-CM | POA: Diagnosis not present

## 2017-09-05 DIAGNOSIS — G894 Chronic pain syndrome: Secondary | ICD-10-CM | POA: Diagnosis not present

## 2017-09-05 DIAGNOSIS — M47817 Spondylosis without myelopathy or radiculopathy, lumbosacral region: Secondary | ICD-10-CM | POA: Diagnosis not present

## 2017-09-09 ENCOUNTER — Ambulatory Visit: Payer: Medicare Other | Admitting: Physical Therapy

## 2017-09-12 ENCOUNTER — Ambulatory Visit: Payer: Self-pay | Admitting: Internal Medicine

## 2017-09-17 NOTE — Patient Instructions (Addendum)
  Test(s) ordered today. Your results will be released to [place] (or called to you) after review, usually within 72hours after test completion. If any changes need to be made, you will be notified at that same time.  No immunizations administered today.   Medications reviewed and updated.  No changes recommended at this time.  Your prescription(s) have been submitted to your pharmacy. Please take as directed and contact our office if you believe you are having problem(s) with the medication(s).   Please followup in 1 month

## 2017-09-17 NOTE — Progress Notes (Signed)
Subjective:    Patient ID: Dawn Kim, female    DOB: 28-Jan-1946, 71 y.o.   MRN: [id number]  HPI The patient is here for follow up.  Hypertension: She is taking her medication daily. She is compliant with a low sodium diet.  She denies chest pain, palpitations, edema, shortness of breath and regular headaches. She is not exercising regularly.  She does not monitor her blood pressure at home.    GERD:  She is taking her medication daily as prescribed.  She denies any GERD symptoms and feels her GERD is well controlled.   Hypothyroidism:  She is taking her medication daily.  She denies any recent changes in energy or weight that are unexplained. Overall, she feels well.    Depression: She is taking her medication daily as prescribed. She denies any side effects from the medication. She feels her depression is controlled and she is happy with her current dose of medication.   Anxiety: She is taking her medication daily as prescribed. She denies any side effects from the medication. She feels her anxiety is controlled and she is happy with her current dose of medication.   Hyperlipidemia: She is taking her medication daily. She is compliant with a low fat/cholesterol diet. She is not exercising regularly. She denies myalgias.   Insomnia:  She is sleeping well right now with her current medication.    Chronic back and knee pain:  She is following with pain management.  She is taking the hydrocodone twice a day most days.  She feels the medication is helping.  She is also taking the gabapentin, but only takes one pill in the morning.  She can not afford to go to pain management anymore.  She thinks she may need to stop taking the medication.    Medications and allergies reviewed with patient and updated if appropriate.  Patient Active Problem List   Diagnosis Date Noted  . Adjustment disorder with mixed anxiety and depressed mood 12/30/2016  . 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 Stye external 06/21/2016  . Osteopenia 06/21/2016  . Lower leg edema 05/31/2016  . Hypothyroidism 10/22/2015  . Depression 10/22/2015  . Anxiety 10/22/2015  . GERD (gastroesophageal reflux disease) 10/22/2015  . Insomnia 10/22/2015  . Foot pain, right 10/22/2015  . Bilateral knee pain 10/22/2015  . HTN (hypertension) 01/20/2012  . Lipid disorder 01/20/2012    Current Outpatient Medications on File Prior to Visit  Medication Sig Dispense Refill  . ALPRAZolam (XANAX) 0.5 MG tablet TAKE 1 TABLET BY MOUTH THREE TIMES DAILY. MUST LAST 30 DAYS 90 tablet 0  . calcium carbonate (OS-CAL - DOSED IN MG OF ELEMENTAL CALCIUM) 1250 (500 Ca) MG tablet Take 1 tablet (500 mg of elemental calcium total) by mouth daily.    . cholecalciferol (VITAMIN D) 1000 UNITS tablet Take 1,000 Units by mouth daily.    . diclofenac sodium (VOLTAREN) 1 % GEL APPLY 2 GRAMS TO AFFECTED AREA 2 TIMES A DAY AS NEEDED FOR PAIN 300 g 1  . ferrous sulfate 325 (65 FE) MG tablet Take 325 mg by mouth daily with breakfast.    . fish oil-omega-3 fatty acids 1000 MG capsule Take 1 g by mouth 2 (two) times daily.     [name] gabapentin (NEURONTIN) 100 MG capsule 100-200 mg at night in addition to 600 mg 60 capsule 5  . gabapentin (NEURONTIN) 300 MG capsule Take 2 capsules (600 mg total) by mouth 2 (  two) times daily. 360 capsule 5  . hydrochlorothiazide (HYDRODIURIL) 25 MG tablet TAKE 1 TABLET BY MOUTH ONCE DAILY 90 tablet 0  . HYDROcodone-acetaminophen (NORCO) 7.5-325 MG tablet Take 7.5-325 tablets by mouth every 6 (six) hours.    [name] levothyroxine (SYNTHROID, LEVOTHROID) 112 MCG tablet TAKE ONE TABLET BY MOUTH ONCE DAILY 90 tablet 0  . Multiple Vitamin (MULTIVITAMIN) tablet Take 1 tablet by mouth daily.    [name] omeprazole (PRILOSEC) 40 MG capsule TAKE ONE CAPSULE BY MOUTH ONCE DAILY 90 capsule 3  . ramipril (ALTACE) 10 MG capsule TAKE ONE CAPSULE BY MOUTH ONCE DAILY 90 capsule 3  . simvastatin (ZOCOR) 40 MG tablet TAKE ONE  TABLET BY MOUTH IN THE EVENING 90 tablet 3  . traZODone (DESYREL) 100 MG tablet TAKE 1 & 1/2 TO 2 (ONE & ONE-HALF TO TWO) TABLETS BY MOUTH AT BEDTIME 60 tablet 5  . venlafaxine XR (EFFEXOR-XR) 150 MG 24 hr capsule TAKE 1 CAPSULE BY MOUTH TWICE DAILY 180 capsule 0   No current facility-administered medications on file prior to visit.     Past Medical History:  Diagnosis Date  . Anxiety   . Cataract    Bil  . Depression   . Hypercholesteremia   . Hypertension   . Thyroid disease     Past Surgical History:  Procedure Laterality Date  . CHOLECYSTECTOMY    . COLONOSCOPY    . FOOT SURGERY  2011   right foot  . JOINT REPLACEMENT  2010   left knee  . POLYPECTOMY    . TONSILLECTOMY      Social History   Socioeconomic History  . Marital status: Married    Spouse name: None  . Number of children: 3  . Years of education: 4  . Highest education level: None  Social Needs  . Financial resource strain: None  . Food insecurity - worry: None  . Food insecurity - inability: None  . Transportation needs - medical: None  . Transportation needs - non-medical: None  Occupational History  . Occupation: Retired   Tobacco Use  . Smoking status: Former Smoker    Types: Cigarettes    Last attempt to quit: 11/04/2009    Years since quitting: 7.8  . Smokeless tobacco: Never Used  Substance and Sexual Activity  . Alcohol use: No  . Drug use: No  . Sexual activity: None  Other Topics Concern  . None  Social History Narrative   Caffeine:     Family History  Problem Relation Age of Onset  . Heart disease Mother     Review of Systems  Constitutional: Negative for chills, fatigue and fever.  Respiratory: Negative for cough, shortness of breath and wheezing.   Cardiovascular: Negative for chest pain, palpitations and leg swelling.  Musculoskeletal: Positive for arthralgias and back pain. Negative for myalgias.  Neurological: Negative for light-headedness and headaches.         Objective:   Vitals:   09/19/17 0956  BP: 120/76  Pulse: 70  Resp: 16  Temp: 97.9 F (36.6 C)  SpO2: 95%   Wt Readings from Last 3 Encounters:  09/19/17 204 lb (92.5 kg)  03/01/17 201 lb (91.2 kg)  02/08/17 199 lb (90.3 kg)   Body mass index is 36.72 kg/m.   Physical Exam    Constitutional: Appears well-developed and well-nourished. No distress.  HENT:  Head: Normocephalic and atraumatic.  Neck: Neck supple. No tracheal deviation present. No thyromegaly present.  No cervical lymphadenopathy Cardiovascular: Normal  rate, regular rhythm and normal heart sounds.   No murmur heard. No carotid bruit .  No edema Pulmonary/Chest: Effort normal and breath sounds normal. No respiratory distress. No has no wheezes. No rales.  Skin: Skin is warm and dry. Not diaphoretic.  Psychiatric: anxious mood and affect. Behavior is normal.      Assessment & Plan:    See Problem List for Assessment and Plan of chronic medical problems.

## 2017-09-19 ENCOUNTER — Encounter: Payer: Self-pay | Admitting: Internal Medicine

## 2017-09-19 ENCOUNTER — Ambulatory Visit: Payer: Medicare Other | Admitting: Internal Medicine

## 2017-09-19 ENCOUNTER — Other Ambulatory Visit (INDEPENDENT_AMBULATORY_CARE_PROVIDER_SITE_OTHER): Payer: Medicare Other

## 2017-09-19 VITALS — BP 120/76 | HR 70 | Temp 97.9°F | Resp 16 | Wt 204.0 lb

## 2017-09-19 DIAGNOSIS — Z96652 Presence of left artificial knee joint: Secondary | ICD-10-CM | POA: Diagnosis not present

## 2017-09-19 DIAGNOSIS — F32A Depression, unspecified: Secondary | ICD-10-CM

## 2017-09-19 DIAGNOSIS — E038 Other specified hypothyroidism: Secondary | ICD-10-CM

## 2017-09-19 DIAGNOSIS — I10 Essential (primary) hypertension: Secondary | ICD-10-CM | POA: Diagnosis not present

## 2017-09-19 DIAGNOSIS — F419 Anxiety disorder, unspecified: Secondary | ICD-10-CM | POA: Diagnosis not present

## 2017-09-19 DIAGNOSIS — T8484XD Pain due to internal orthopedic prosthetic devices, implants and grafts, subsequent encounter: Secondary | ICD-10-CM

## 2017-09-19 DIAGNOSIS — G8929 Other chronic pain: Secondary | ICD-10-CM

## 2017-09-19 DIAGNOSIS — F329 Major depressive disorder, single episode, unspecified: Secondary | ICD-10-CM | POA: Diagnosis not present

## 2017-09-19 DIAGNOSIS — E789 Disorder of lipoprotein metabolism, unspecified: Secondary | ICD-10-CM

## 2017-09-19 DIAGNOSIS — M545 Low back pain, unspecified: Secondary | ICD-10-CM | POA: Insufficient documentation

## 2017-09-19 DIAGNOSIS — G4700 Insomnia, unspecified: Secondary | ICD-10-CM

## 2017-09-19 DIAGNOSIS — K219 Gastro-esophageal reflux disease without esophagitis: Secondary | ICD-10-CM

## 2017-09-19 LAB — CBC WITH DIFFERENTIAL/PLATELET
BASOS PCT: 0.1 % (ref 0.0–3.0)
Basophils Absolute: 0 10*3/uL (ref 0.0–0.1)
EOS PCT: 1.8 % (ref 0.0–5.0)
Eosinophils Absolute: 0.1 10*3/uL (ref 0.0–0.7)
HEMATOCRIT: 43.3 % (ref 36.0–46.0)
HEMOGLOBIN: 14.1 g/dL (ref 12.0–15.0)
LYMPHS PCT: 31.1 % (ref 12.0–46.0)
Lymphs Abs: 2.5 10*3/uL (ref 0.7–4.0)
MCHC: 32.7 g/dL (ref 30.0–36.0)
MCV: 93.5 fl (ref 78.0–100.0)
MONOS PCT: 6.4 % (ref 3.0–12.0)
Monocytes Absolute: 0.5 10*3/uL (ref 0.1–1.0)
NEUTROS ABS: 4.8 10*3/uL (ref 1.4–7.7)
Neutrophils Relative %: 60.6 % (ref 43.0–77.0)
PLATELETS: 248 10*3/uL (ref 150.0–400.0)
RBC: 4.63 Mil/uL (ref 3.87–5.11)
RDW: 12.6 % (ref 11.5–15.5)
WBC: 7.9 10*3/uL (ref 4.0–10.5)

## 2017-09-19 LAB — COMPREHENSIVE METABOLIC PANEL
ALBUMIN: 4.2 g/dL (ref 3.5–5.2)
ALK PHOS: 42 U/L (ref 39–117)
ALT: 20 U/L (ref 0–35)
AST: 20 U/L (ref 0–37)
BILIRUBIN TOTAL: 0.7 mg/dL (ref 0.2–1.2)
BUN: 11 mg/dL (ref 6–23)
CALCIUM: 9.3 mg/dL (ref 8.4–10.5)
CHLORIDE: 98 meq/L (ref 96–112)
CO2: 34 mEq/L — ABNORMAL HIGH (ref 19–32)
CREATININE: 1.05 mg/dL (ref 0.40–1.20)
GFR: 54.55 mL/min — ABNORMAL LOW (ref >60.00)
Glucose, Bld: 87 mg/dL (ref 70–99)
Potassium: 3.7 mEq/L (ref 3.5–5.1)
Sodium: 142 mEq/L (ref 135–145)
TOTAL PROTEIN: 6.6 g/dL (ref 6.0–8.3)

## 2017-09-19 LAB — TSH: TSH: 0.8 u[IU]/mL (ref 0.35–4.50)

## 2017-09-19 LAB — LDL CHOLESTEROL, DIRECT: Direct LDL: 82 mg/dL

## 2017-09-19 LAB — LIPID PANEL
Cholesterol: 154 mg/dL (ref 0–200)
HDL: 47.4 mg/dL (ref >39.00)
NonHDL: 106.4
TRIGLYCERIDES: 242 mg/dL — AB (ref 0.0–149.0)
Total CHOL/HDL Ratio: 3
VLDL: 48.4 mg/dL — ABNORMAL HIGH (ref 0.0–40.0)

## 2017-09-19 NOTE — Assessment & Plan Note (Signed)
BP well controlled Current regimen effective and well tolerated Continue current medications at current doses cmp  

## 2017-09-19 NOTE — Assessment & Plan Note (Signed)
Following with pain management Taking gabapentin - inc morning dose to 600 mg - continue 800 mg at bedtime Taking hydrocodone which I have agreed to start prescribing - will fu with me in about 3 weeks for chronic pain management visit

## 2017-09-19 NOTE — Assessment & Plan Note (Signed)
Chronic severe pain Currently seeing pain management, but this is very expensive and she can not afford it She is taking her pain medication appropriately and it is helping her pain and improve her quality of life, so I will start prescribing her medication -- she will need to follow up with me in 3 weeks for this

## 2017-09-19 NOTE — Assessment & Plan Note (Signed)
Feels her anxiety is reasonably controlled Continue current medication

## 2017-09-19 NOTE — Assessment & Plan Note (Signed)
Controlled, stable Continue current dose of medication  

## 2017-09-19 NOTE — Assessment & Plan Note (Signed)
GERD controlled Continue daily medication  

## 2017-09-19 NOTE — Assessment & Plan Note (Signed)
Check lipid panel  Continue daily statin Regular exercise and healthy diet encouraged  

## 2017-09-19 NOTE — Assessment & Plan Note (Signed)
Continue current dose of trazodone - this plus the gabapentin seems to be working

## 2017-09-19 NOTE — Assessment & Plan Note (Signed)
Clinically euthyroid Check tsh  Titrate med dose if needed  

## 2017-09-21 ENCOUNTER — Other Ambulatory Visit: Payer: Self-pay | Admitting: Internal Medicine

## 2017-09-22 ENCOUNTER — Other Ambulatory Visit: Payer: Self-pay | Admitting: Internal Medicine

## 2017-09-28 ENCOUNTER — Telehealth: Payer: Self-pay | Admitting: Internal Medicine

## 2017-09-28 NOTE — Telephone Encounter (Signed)
Patient called in with clarification on results called to her earlier today, results given per notes Dr. Senaida Ores on 09/25/17, patient verbalized understanding. She asked was there any pill that can be taken to help you lose weight, I advised she could talk to her provider about that, and she said she will talk about it at her next [place] in January.

## 2017-10-05 DIAGNOSIS — R52 Pain, unspecified: Secondary | ICD-10-CM | POA: Insufficient documentation

## 2017-10-05 NOTE — Progress Notes (Signed)
Subjective:    Patient ID: Dawn Kim, female    DOB: 03-21-1946, 72 y.o.   MRN: [id number]  HPI The patient is here for follow up for chronic pain management.  I will be prescribing her pain medication - she will no longer be going to pain management.  Indication for chronic opioid: chronic pain in back and b/l knee Medication and dose: hydrocodone-acetaminophen 7.5-325 mg 1 tablet 2-3 times a daily, gabapentin # pills per month: 90 Last UDS date: 08/03/17 by preferred pain management Pain contract signed (Y/N): 10/06/17 Date narcotic database last reviewed (include red flags): 10/06/17, no red flags - last filled 09/09/17  Pain assessment:  Pain intensity: 3 /10  Amount of pain relief with medication:   Helps a lot - she needs to take it so she can walk Use of pain medications:  Takes it 2-3 times a day Side effects:    none Sleep:   Ok - taking sleep medication Mood:  Denies anxiety, but take xanax as needed, at times feels depressed Functional/social activities: continues to be active in home setting    Last took prescribed pain medication:  Alcohol use:   none Tobacco use:   none Street Drug use:   none  Anxiety: She is taking her Effexor daily as prescribed.  She takes Xanax as needed and states she is not taking it regularly.  She denies any side effects from the medication. She feels her anxiety is well controlled and she is happy with her current dose of medication.  Depression: She is taking her effexor daily as prescribed. She wonders if she may be bipolar.  Psychiatry is not covered by her insurance.  She is unsure if her depression is poorly controlled, but is not willing to make any changes in her Effexor.  She does not want to pay to see a psychiatrist.   She has increased darker skin and puffiness under her eyes.  She did see her eye doctor in May and has dry eyes.  She has had tear duct patches.  She denies any cold symptoms including sinus pain and pressure,  nasal congestion, fevers and chills.  Medications and allergies reviewed with patient and updated if appropriate.  Patient Active Problem List   Diagnosis Date Noted  . Encounter for pain management 10/05/2017  . Chronic lower back pain 09/19/2017  . 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 Stye external 06/21/2016  . Osteopenia 06/21/2016  . Lower leg edema 05/31/2016  . Hypothyroidism 10/22/2015  . Depression 10/22/2015  . Anxiety 10/22/2015  . GERD (gastroesophageal reflux disease) 10/22/2015  . Insomnia 10/22/2015  . Foot pain, right 10/22/2015  . Bilateral knee pain 10/22/2015  . HTN (hypertension) 01/20/2012  . Lipid disorder 01/20/2012    Current Outpatient Medications on File Prior to Visit  Medication Sig Dispense Refill  . ALPRAZolam (XANAX) 0.5 MG tablet TAKE 1 TABLET BY MOUTH THREE TIMES DAILY. MUST LAST 30 DAYS 90 tablet 0  . calcium carbonate (OS-CAL - DOSED IN MG OF ELEMENTAL CALCIUM) 1250 (500 Ca) MG tablet Take 1 tablet (500 mg of elemental calcium total) by mouth daily.    . cholecalciferol (VITAMIN D) 1000 UNITS tablet Take 1,000 Units by mouth daily.    . diclofenac sodium (VOLTAREN) 1 % GEL APPLY 2 GRAMS TO AFFECTED AREA 2 TIMES A DAY AS NEEDED FOR PAIN 300 g 1  . ferrous sulfate 325 (65 FE) MG tablet Take 325 mg by  mouth daily with breakfast.    . fish oil-omega-3 fatty acids 1000 MG capsule Take 1 g by mouth 2 (two) times daily.     [name] gabapentin (NEURONTIN) 100 MG capsule 100-200 mg at night in addition to 600 mg 60 capsule 5  . gabapentin (NEURONTIN) 300 MG capsule Take 2 capsules (600 mg total) by mouth 2 (two) times daily. 360 capsule 5  . hydrochlorothiazide (HYDRODIURIL) 25 MG tablet TAKE 1 TABLET BY MOUTH ONCE DAILY 90 tablet 0  . HYDROcodone-acetaminophen (NORCO) 7.5-325 MG tablet Take 7.5-325 tablets by mouth every 8 (eight) hours as needed.    [name] levothyroxine (SYNTHROID, LEVOTHROID) 112 MCG tablet TAKE ONE TABLET BY MOUTH ONCE  DAILY 90 tablet 0  . Multiple Vitamin (MULTIVITAMIN) tablet Take 1 tablet by mouth daily.    [name] omeprazole (PRILOSEC) 40 MG capsule TAKE ONE CAPSULE BY MOUTH ONCE DAILY 90 capsule 3  . ramipril (ALTACE) 10 MG capsule TAKE ONE CAPSULE BY MOUTH ONCE DAILY 90 capsule 3  . simvastatin (ZOCOR) 40 MG tablet TAKE ONE TABLET BY MOUTH IN THE EVENING 90 tablet 3  . traZODone (DESYREL) 100 MG tablet TAKE 1 & 1/2 TO 2 (ONE & ONE-HALF TO TWO) TABLETS BY MOUTH AT BEDTIME 180 tablet 0  . venlafaxine XR (EFFEXOR-XR) 150 MG 24 hr capsule TAKE 1 CAPSULE BY MOUTH TWICE DAILY 180 capsule 0   No current facility-administered medications on file prior to visit.     Past Medical History:  Diagnosis Date  . Anxiety   . Cataract    Bil  . Depression   . Hypercholesteremia   . Hypertension   . Thyroid disease     Past Surgical History:  Procedure Laterality Date  . CHOLECYSTECTOMY    . COLONOSCOPY    . FOOT SURGERY  2011   right foot  . JOINT REPLACEMENT  2010   left knee  . POLYPECTOMY    . TONSILLECTOMY      Social History   Socioeconomic History  . Marital status: Married    Spouse name: None  . Number of children: 3  . Years of education: 62  . Highest education level: None  Social Needs  . Financial resource strain: None  . Food insecurity - worry: None  . Food insecurity - inability: None  . Transportation needs - medical: None  . Transportation needs - non-medical: None  Occupational History  . Occupation: Retired   Tobacco Use  . Smoking status: Former Smoker    Types: Cigarettes    Last attempt to quit: 11/04/2009    Years since quitting: 7.9  . Smokeless tobacco: Never Used  Substance and Sexual Activity  . Alcohol use: No  . Drug use: No  . Sexual activity: None  Other Topics Concern  . None  Social History Narrative  . None    Family History  Problem Relation Age of Onset  . Heart disease Mother   . Drug abuse Sister     Review of Systems  Constitutional:  Negative for chills and fever.  HENT: Negative for congestion, ear pain, sinus pressure and sore throat.   Eyes: Positive for discharge (clear water) and itching.  Musculoskeletal: Positive for arthralgias.  Neurological: Negative for light-headedness and headaches.  Psychiatric/Behavioral: Positive for dysphoric mood and sleep disturbance (controlled). The patient is nervous/anxious (intermittent).        Objective:   Vitals:   10/06/17 1030  BP: (!) 144/80  Pulse: 72  Resp: 18  Temp:  97.7 F (36.5 C)  SpO2: 92%   Wt Readings from Last 3 Encounters:  10/06/17 209 lb (94.8 kg)  09/19/17 204 lb (92.5 kg)  03/01/17 201 lb (91.2 kg)   Body mass index is 37.62 kg/m.   Physical Exam    Constitutional: Appears well-developed and well-nourished. No distress.  HENT:  Head: Normocephalic and atraumatic.  puffiness and darker skin under eyes Neck: Neck supple. No tracheal deviation present. No thyromegaly present.  No cervical lymphadenopathy Cardiovascular: Normal rate, regular rhythm and normal heart sounds.   No murmur heard. No carotid bruit .  No edema Pulmonary/Chest: Effort normal and breath sounds normal. No respiratory distress. No has no wheezes. No rales.  Skin: Skin is warm and dry. Not diaphoretic.  Psychiatric: Normal mood and affect. Behavior is normal.      Assessment & Plan:    See Problem List for Assessment and Plan of chronic medical problems.

## 2017-10-05 NOTE — Progress Notes (Addendum)
Subjective:   Dawn Kim is a 72 y.o. female who presents for an Initial Medicare Annual Wellness Visit.  Review of Systems    No ROS.  Medicare Wellness Visit. Additional risk factors are reflected in the social history.   Cardiac Risk Factors include: advanced age (>47men, >62 women);hypertension;obesity (BMI >30kg/m2);sedentary lifestyle  Sleep patterns: feels rested on waking, gets up 1-2 times nightly to void and sleeps 7-8 hours nightly.    Home Safety/Smoke Alarms: Feels safe in home. Smoke alarms in place.  Living environment; residence and Firearm Safety: 1-story house/ trailer, no firearms Lives with husband, no needs for DME, limited support system Seat Belt Safety/Bike Helmet: Wears seat belt.     Objective:    Today's Vitals   10/06/17 1208  PainSc: 4    There is no height or weight on file to calculate BMI.  Advanced Directives 10/06/2017 12/29/2016 12/29/2016 09/01/2015 02/07/2015  Does Patient Have a Medical Advance Directive? Yes No No No No  Type of Paramedic of [place];Living will - - - -  Copy of [place] in Chart? No - copy requested - - - -  Would patient like information on creating a medical advance directive? - No - Patient declined No - Patient declined No - patient declined information No - patient declined information    Current Medications (verified) Outpatient Encounter Medications as of 10/06/2017  Medication Sig  . ALPRAZolam (XANAX) 0.5 MG tablet TAKE 1 TABLET BY MOUTH THREE TIMES DAILY. MUST LAST 30 DAYS  . calcium carbonate (OS-CAL - DOSED IN MG OF ELEMENTAL CALCIUM) 1250 (500 Ca) MG tablet Take 1 tablet (500 mg of elemental calcium total) by mouth daily.  . cholecalciferol (VITAMIN D) 1000 UNITS tablet Take 1,000 Units by mouth daily.  . diclofenac sodium (VOLTAREN) 1 % GEL APPLY 2 GRAMS TO AFFECTED AREA 2 TIMES A DAY AS NEEDED FOR PAIN  . ferrous sulfate 325 (65 FE) MG tablet Take 325 mg by mouth  daily with breakfast.  . fish oil-omega-3 fatty acids 1000 MG capsule Take 1 g by mouth 2 (two) times daily.   [name] gabapentin (NEURONTIN) 100 MG capsule 100-200 mg at night in addition to 600 mg  . gabapentin (NEURONTIN) 300 MG capsule Take 2 capsules (600 mg total) by mouth 2 (two) times daily.  . hydrochlorothiazide (HYDRODIURIL) 25 MG tablet TAKE 1 TABLET BY MOUTH ONCE DAILY  . HYDROcodone-acetaminophen (NORCO) 7.5-325 MG tablet Take 7.5-325 tablets by mouth every 8 (eight) hours as needed.  [name] levothyroxine (SYNTHROID, LEVOTHROID) 112 MCG tablet TAKE ONE TABLET BY MOUTH ONCE DAILY  . Multiple Vitamin (MULTIVITAMIN) tablet Take 1 tablet by mouth daily.  [name] omeprazole (PRILOSEC) 40 MG capsule TAKE ONE CAPSULE BY MOUTH ONCE DAILY  . ramipril (ALTACE) 10 MG capsule TAKE ONE CAPSULE BY MOUTH ONCE DAILY  . simvastatin (ZOCOR) 40 MG tablet TAKE ONE TABLET BY MOUTH IN THE EVENING  . traZODone (DESYREL) 100 MG tablet TAKE 1 & 1/2 TO 2 (ONE & ONE-HALF TO TWO) TABLETS BY MOUTH AT BEDTIME  . venlafaxine XR (EFFEXOR-XR) 150 MG 24 hr capsule TAKE 1 CAPSULE BY MOUTH TWICE DAILY   No facility-administered encounter medications on file as of 10/06/2017.     Allergies (verified) Penicillins and Amoxicillin   History: Past Medical History:  Diagnosis Date  . Anxiety   . Cataract    Bil  . Depression   . Hypercholesteremia   . Hypertension   . Thyroid  disease    Past Surgical History:  Procedure Laterality Date  . CHOLECYSTECTOMY    . COLONOSCOPY    . FOOT SURGERY  2011   right foot  . JOINT REPLACEMENT  2010   left knee  . POLYPECTOMY    . TONSILLECTOMY     Family History  Problem Relation Age of Onset  . Heart disease Mother   . Drug abuse Sister    Social History   Socioeconomic History  . Marital status: Married    Spouse name: None  . Number of children: 3  . Years of education: 58  . Highest education level: None  Social Needs  . Financial resource strain: Not very hard  .  Food insecurity - worry: Never true  . Food insecurity - inability: Never true  . Transportation needs - medical: No  . Transportation needs - non-medical: No  Occupational History  . Occupation: Retired   Tobacco Use  . Smoking status: Former Smoker    Types: Cigarettes    Last attempt to quit: 11/04/2009    Years since quitting: 7.9  . Smokeless tobacco: Never Used  Substance and Sexual Activity  . Alcohol use: No  . Drug use: No  . Sexual activity: No  Other Topics Concern  . None  Social History Narrative  . None    Tobacco Counseling Counseling given: Not Answered  Activities of Daily Living In your present state of health, do you have any difficulty performing the following activities: 10/06/2017 12/29/2016  Hearing? N N  Vision? N N  Difficulty concentrating or making decisions? Y N  Walking or climbing stairs? N N  Dressing or bathing? N N  Doing errands, shopping? N -  Preparing Food and eating ? N -  Using the Toilet? N -  In the past six months, have you accidently leaked urine? N -  Do you have problems with loss of bowel control? N -  Managing your Medications? N -  Managing your Finances? N -  Housekeeping or managing your Housekeeping? N -  Some recent data might be hidden     Immunizations and Health Maintenance Immunization History  Administered Date(s) Administered  . Influenza Split 07/04/2012  . Influenza, High Dose Seasonal PF 06/21/2016  . Influenza,inj,Quad PF,6+ Mos 06/25/2014  . Influenza-Unspecified 06/05/2015, 07/05/2017  . Pneumococcal Conjugate-13 09/01/2016  . Zoster Recombinat (Shingrix) 03/04/2017, 05/25/2017   Health Maintenance Due  Topic Date Due  . TETANUS/TDAP  05/25/1963  . PNA vac Low Risk Adult (2 of 2 - PPSV23) 09/01/2017    Patient Care Team: Binnie Rail, MD as PCP - General (Internal Medicine)  Indicate any recent Medical Services you may have received from other than [facility] providers in the past year (date may be  approximate).     Assessment:   This is a routine wellness examination for Dawn Kim. Physical assessment deferred to PCP.   Hearing/Vision screen Hearing Screening Comments: Able to hear conversational tones w/o difficulty. No issues reported.  Passed whisper test Vision Screening Comments: appointment yearly, at [place] issues and exercise activities discussed: Current Exercise Habits: The patient does not participate in regular exercise at present(States she plans to join [employer] Exercise limited by: orthopedic condition(s) Diet (meal preparation, eat out, water intake, caffeinated beverages, dairy products, fruits and vegetables): in general, a "healthy" diet     Reviewed heart healthy diet, encouraged patient to increase daily water intake. Discussed portion control and reading food labels. Diet education  was provided via handout.  Goals    . Patient Stated     Start to increase physical my activity by going to [facility] and participating in [employer]. Monitor what I eat to be healthier. Use portion control and drink more water.       Depression Screen PHQ 2/9 Scores 10/06/2017 03/01/2017 02/17/2016  PHQ - 2 Score 3 0 0  PHQ- 9 Score 7 2 -    Fall Risk Fall Risk  10/06/2017 03/01/2017 02/17/2016  Falls in the past year? Yes Yes Yes  Number falls in past yr: 1 2 or more 2 or more  Injury with Fall? No Yes No  Risk Factor Category  - High Fall Risk -  Risk for fall due to : - History of fall(s);Impaired balance/gait -   Cognitive Function: MMSE - Mini Mental State Exam 10/06/2017  Not completed: Refused        Screening Tests Health Maintenance  Topic Date Due  . TETANUS/TDAP  05/25/1963  . PNA vac Low Risk Adult (2 of 2 - PPSV23) 09/01/2017  . DEXA SCAN  05/06/2018  . MAMMOGRAM  03/15/2019  . Fecal DNA (Cologuard)  05/10/2019  . INFLUENZA VACCINE  Completed  . Hepatitis C Screening  Completed     Plan:    Start doing brain stimulating  activities (puzzles, reading, adult coloring books, staying active) to keep memory sharp.   Continue to eat heart healthy diet (full of fruits, vegetables, whole grains, lean protein, water--limit salt, fat, and sugar intake) and increase physical activity as tolerated.  I have personally reviewed and noted the following in the patient's chart:   . Medical and social history . Use of alcohol, tobacco or illicit drugs  . Current medications and supplements . Functional ability and status . Nutritional status . Physical activity . Advanced directives . List of other physicians . Vitals . Screenings to include cognitive, depression, and falls . Referrals and appointments  In addition, I have reviewed and discussed with patient certain preventive protocols, quality metrics, and best practice recommendations. A written personalized care plan for preventive services as well as general preventive health recommendations were provided to patient.     Michiel Cowboy, RN   10/06/2017    Medical screening examination/treatment/procedure(s) were performed by non-physician practitioner and as supervising physician I was immediately available for consultation/collaboration. I agree with above. Binnie Rail, MD

## 2017-10-06 ENCOUNTER — Ambulatory Visit (INDEPENDENT_AMBULATORY_CARE_PROVIDER_SITE_OTHER): Payer: Medicare Other | Admitting: *Deleted

## 2017-10-06 ENCOUNTER — Ambulatory Visit: Payer: Medicare Other | Admitting: Internal Medicine

## 2017-10-06 ENCOUNTER — Encounter: Payer: Self-pay | Admitting: Internal Medicine

## 2017-10-06 ENCOUNTER — Encounter: Payer: Self-pay | Admitting: Emergency Medicine

## 2017-10-06 VITALS — BP 144/80 | HR 72 | Temp 97.7°F | Resp 18 | Wt 209.0 lb

## 2017-10-06 DIAGNOSIS — M545 Low back pain, unspecified: Secondary | ICD-10-CM

## 2017-10-06 DIAGNOSIS — M25561 Pain in right knee: Secondary | ICD-10-CM | POA: Diagnosis not present

## 2017-10-06 DIAGNOSIS — F419 Anxiety disorder, unspecified: Secondary | ICD-10-CM

## 2017-10-06 DIAGNOSIS — Z23 Encounter for immunization: Secondary | ICD-10-CM

## 2017-10-06 DIAGNOSIS — F329 Major depressive disorder, single episode, unspecified: Secondary | ICD-10-CM

## 2017-10-06 DIAGNOSIS — R52 Pain, unspecified: Secondary | ICD-10-CM

## 2017-10-06 DIAGNOSIS — G8929 Other chronic pain: Secondary | ICD-10-CM

## 2017-10-06 DIAGNOSIS — F32A Depression, unspecified: Secondary | ICD-10-CM

## 2017-10-06 DIAGNOSIS — Z0000 Encounter for general adult medical examination without abnormal findings: Secondary | ICD-10-CM | POA: Diagnosis not present

## 2017-10-06 DIAGNOSIS — M25562 Pain in left knee: Secondary | ICD-10-CM

## 2017-10-06 MED ORDER — HYDROCODONE-ACETAMINOPHEN 7.5-325 MG PO TABS: 1.0000 | ORAL_TABLET | Freq: Three times a day (TID) | ORAL | 0 refills | Status: DC | PRN

## 2017-10-06 NOTE — Assessment & Plan Note (Signed)
Taking xanax as needed - not taking it regularly Taking effexor daily

## 2017-10-06 NOTE — Assessment & Plan Note (Signed)
Indication for chronic opioid: chronic pain in back and b/l knee Medication and dose: hydrocodone-acetaminophen 7.5-325 mg 1 tablet 2-3 times a daily, gabapentin # pills per month: 90 Last UDS date: 08/03/17 by preferred pain management Pain contract signed (Y/N): 10/06/17 Date narcotic database last reviewed (include red flags): 10/06/17, no red flags - last filled 09/09/17  Follow-up in 3 months

## 2017-10-06 NOTE — Assessment & Plan Note (Signed)
Left knee much more than right knee Status post knee replacement-has seen orthopedics and no further treatment is available Chronic pain management with hydrocodone-acetaminophen 7.5-325 mg-she is taking 1 pill 2-3 times a day as needed.  No longer following with pain management and I will prescribe her medication Pain contract signed UDS done by preferred pain management 11/03/16 Follow-up in 3 months Prescription sent to pharmacy-refill due next week

## 2017-10-06 NOTE — Patient Instructions (Addendum)
Start doing brain stimulating activities (puzzles, reading, adult coloring books, staying active) to keep memory sharp.   Continue to eat heart healthy diet (full of fruits, vegetables, whole grains, lean protein, water--limit salt, fat, and sugar intake) and increase physical activity as tolerated.   Dawn Kim , Thank you for taking time to come for your Medicare Wellness Visit. I appreciate your ongoing commitment to your health goals. Please review the following plan we discussed and let me know if I can assist you in the future.   These are the goals we discussed: Goals    . Patient Stated     Start to increase physical activity by going to [facility] and participating in [employer]. Monitor what I eat to be healthier.       This is a list of the screening recommended for you and due dates:  Health Maintenance  Topic Date Due  . Tetanus Vaccine  05/25/1963  . Pneumonia vaccines (2 of 2 - PPSV23) 09/01/2017  . DEXA scan (bone density measurement)  05/06/2018  . Mammogram  03/15/2019  . Cologuard (Stool DNA test)  05/10/2019  . Flu Shot  Completed  .  Hepatitis C: One time screening is recommended by Center for Disease Control  (CDC) for  adults born from [birth year] through 1967.   Completed

## 2017-10-06 NOTE — Assessment & Plan Note (Addendum)
?   Controlled Taking effexor - does not want to make changes Would benefit from seeing a psychiatrist but it is not covered by her insurance Continue current dose of Effexor

## 2017-10-06 NOTE — Patient Instructions (Signed)
   Medications reviewed and updated.  No changes recommended at this time.   Please followup in 3 months    

## 2017-10-06 NOTE — Assessment & Plan Note (Signed)
Improved with chronic pain medication Her knee pain is ultimately why she is on pain medication, but it also helps her chronic lower back pain Does not want invasive treatment for the back pain-has discussed this with pain management in the past

## 2017-10-09 ENCOUNTER — Other Ambulatory Visit: Payer: Self-pay | Admitting: Internal Medicine

## 2017-10-14 ENCOUNTER — Telehealth: Payer: Self-pay | Admitting: Internal Medicine

## 2017-10-14 ENCOUNTER — Ambulatory Visit: Payer: Self-pay | Admitting: *Deleted

## 2017-10-14 NOTE — Telephone Encounter (Signed)
Copied from [place] [id number]. Topic: General - Other >> Oct 14, 2017 12:59 PM Valla Leaver wrote: Reason for CRM: Patient calling back after being told by [facility] nurse she needs to be seen at Ed, UC or call 911. She declined and wants to be called back by Dr, Eilleen Kempf nurse/cma. See tel note from today 01/11.

## 2017-10-14 NOTE — Telephone Encounter (Signed)
Pt called reporting tremors,muscle pain and weakness, SOB, dizziness, states she is confused, weight gain, unable to sleep at HS,states fell 2 weeks ago "twice" and paramedics came to pick her up off floor. Reports she is having to hold onto furniture and walls to ambulate.  States her Neurontin was increased about the time S/S began. Pt. declines 911, ED or UC visit. Pt states" I have all symptoms except a sore throat and no seizures."  Note routed to practice.

## 2017-10-14 NOTE — Telephone Encounter (Signed)
Spoke with Dawn Kim, per Dr Quay Burow Dawn Kim needs to decrease Gabapentin to 300 mg twice daily, 100 mg once daily.

## 2017-10-14 NOTE — Telephone Encounter (Signed)
See Note from Elliot Cousin from today.

## 2017-10-27 ENCOUNTER — Other Ambulatory Visit: Payer: Self-pay | Admitting: Internal Medicine

## 2017-10-27 NOTE — Telephone Encounter (Signed)
[place] Controlled Substance Database checked. Last filled on 09/29/17

## 2017-11-03 ENCOUNTER — Other Ambulatory Visit: Payer: Self-pay | Admitting: Internal Medicine

## 2017-11-04 ENCOUNTER — Telehealth: Payer: Self-pay | Admitting: Internal Medicine

## 2017-11-04 NOTE — Telephone Encounter (Signed)
Copied from [place] [phone]. Topic: Inquiry >> Nov 04, 2017  9:08 AM Pricilla Handler wrote: Reason for CRM: Patient stated that her medication TraZODone (DESYREL) 100 MG tablet was not working. Patient would like another medication if possible.       Thank You!!!

## 2017-11-05 NOTE — Telephone Encounter (Addendum)
We can retry belsomra to see if that will be covered or seroquel - those are the only options - both were not approved by her insurance last year, but may be approved this year.  Ask if she has a preference.

## 2017-11-07 MED ORDER — QUETIAPINE FUMARATE 25 MG PO TABS: 25.0000 mg | ORAL_TABLET | Freq: Every day | ORAL | 5 refills | Status: DC

## 2017-11-07 NOTE — Telephone Encounter (Signed)
[facility] [address], [place] [id number]   Patient would like generic for Seroquel, she checked w/ her insurance. Please note her pharm.

## 2017-11-07 NOTE — Addendum Note (Signed)
Addended by: Binnie Rail on: 11/07/2017 04:51 PM   Modules accepted: Orders

## 2017-11-07 NOTE — Telephone Encounter (Signed)
Patient says she will try whichever of the two Dr. Quay Burow recommends. She also prefers the generic to whichever is selected.

## 2017-11-07 NOTE — Telephone Encounter (Signed)
Prescription sent.  Please remind her she needs to stop the trazodone before she starts this medication.  We can increase the dose if needed.

## 2017-11-08 MED ORDER — HYDROCODONE-ACETAMINOPHEN 7.5-325 MG PO TABS: 1.0000 | ORAL_TABLET | Freq: Three times a day (TID) | ORAL | 0 refills | Status: DC | PRN

## 2017-11-08 NOTE — Addendum Note (Signed)
Addended by: Binnie Rail on: 11/08/2017 12:36 PM   Modules accepted: Orders

## 2017-11-08 NOTE — Addendum Note (Signed)
Addended by: Terence Lux B on: 11/08/2017 11:57 AM   Modules accepted: Orders

## 2017-11-08 NOTE — Telephone Encounter (Signed)
Spoke with pt to inform. Pt is requesting refill on Hydrocodone. [place] Controlled Substance Database checked. Last filled on 10/10/17

## 2017-11-10 ENCOUNTER — Telehealth: Payer: Self-pay | Admitting: Internal Medicine

## 2017-11-10 MED ORDER — VENLAFAXINE HCL ER 75 MG PO CP24: 75.0000 mg | ORAL_CAPSULE | Freq: Every day | ORAL | 1 refills | Status: DC

## 2017-11-10 NOTE — Telephone Encounter (Signed)
Copied from [place] [phone] [id number]. Topic: Inquiry >> Nov 10, 2017  2:26 PM Neva Seat wrote: Venlafaxine caps 150 mger  Pt says [employer] will not cover her taking 2 of the above Rx a day.  They stated they will only cover 1 a day.  Pt isn't sure what she needs to do since she has been taking the Rx for so long. Please call pt back to discuss asap.

## 2017-11-10 NOTE — Telephone Encounter (Signed)
We should decrease slowly so she does not have side effects - if she is ok with that - send new rx for 75 mg pills - take 3 daily (total of 225 mg)  We will see if they will pay for that and see how she does on that dose.

## 2017-11-10 NOTE — Telephone Encounter (Signed)
Pt states she has quite a few 150 mg capsules left, advised she can use those up taking 1 daily along with 1 75 mg capsule. She was okay with this and understood changes. RX sent to [facility]

## 2017-11-15 ENCOUNTER — Other Ambulatory Visit: Payer: Self-pay | Admitting: Internal Medicine

## 2017-11-21 ENCOUNTER — Telehealth: Payer: Self-pay | Admitting: Internal Medicine

## 2017-11-21 NOTE — Telephone Encounter (Signed)
Can we try a PA for quantity exception.  This is the only med she has been on and has been on this dose for a while.  She has severe anxiety which is currently controlled - I am concerned about changing the dose - good chance her anxiety will increase.  Unable to see a psychiatry - not covered by insurance.

## 2017-11-21 NOTE — Telephone Encounter (Signed)
Copied from [place]. Topic: Inquiry >> Nov 21, 2017  7:39 AM Corie Chiquito, [place] wrote: Reason for CRM: Patient calling because she received a letter from [facility] stating that they will not cover her taking  Venlafaxine XR 150 mg 2 times a day. They are only going to cover her taking the medication once a day. Stated that she has always taken two a day and she is up set about this. She would like to know if there is anything that Dr.Burns could do about this.She would like to speak with Dr.Burn or her nurse about this. She can be reached at [phone] or [phone]

## 2017-11-21 NOTE — Telephone Encounter (Signed)
See request - pt. Reports she has been on her current dose and is concerned about decreasing it.Thanks.

## 2017-11-30 ENCOUNTER — Other Ambulatory Visit: Payer: Self-pay | Admitting: Internal Medicine

## 2017-11-30 NOTE — Telephone Encounter (Signed)
PA has been completed after speaking to insurance, awaiting approval.

## 2017-11-30 NOTE — Telephone Encounter (Signed)
Pt called to say she spoke with [employer] and they told her they have not heard from Dr Quay Burow yet. She is  asking for an update on this, she says its been a week. Pt is worried about this. Please contact her.

## 2017-11-30 NOTE — Telephone Encounter (Signed)
Spoke with to to inform PA was approved for 180 tablets.   Pt also states that she stopped the seroquel and started back on the trazodone bc it was not working. Advised pt that she should let us know when a med is not working. Okay to send in a refill for Trazodone?

## 2017-11-30 NOTE — Telephone Encounter (Signed)
Okay to send in both prescriptions

## 2017-11-30 NOTE — Telephone Encounter (Signed)
Aaron Edelman with [facility] is calling and states he has notified the patient that she was approved for 150mg  for the whole year. Also that she wants the pharmacy to fill 180 tablets instead of 90.

## 2017-12-01 MED ORDER — TRAZODONE HCL 100 MG PO TABS: ORAL_TABLET | ORAL | 0 refills | Status: DC

## 2017-12-05 ENCOUNTER — Other Ambulatory Visit: Payer: Self-pay | Admitting: Emergency Medicine

## 2017-12-05 MED ORDER — HYDROCODONE-ACETAMINOPHEN 7.5-325 MG PO TABS: 1.0000 | ORAL_TABLET | Freq: Three times a day (TID) | ORAL | 0 refills | Status: DC | PRN

## 2017-12-05 NOTE — Telephone Encounter (Signed)
[place] Controlled Substance Database checked. Last filled on 11/10/17, please enter fill date on RX

## 2017-12-09 ENCOUNTER — Telehealth: Payer: Self-pay | Admitting: Internal Medicine

## 2017-12-09 NOTE — Telephone Encounter (Signed)
Notified pt that she is not due until 2020 but to check with her insurance in the mean time to see if it is covered.

## 2017-12-09 NOTE — Telephone Encounter (Signed)
Copied from [place] [id number]. Topic: Quick Communication - See Telephone Encounter >> Dec 09, 2017  8:17 AM Ether Griffins B wrote: CRM for notification. See Telephone encounter for:  Pt requesting a colo guard test be sent to her. Her husband just got out of the hospital and is unable to have a colonoscopy done. She is hoping to start with the colo guard first.  12/09/17.

## 2017-12-22 ENCOUNTER — Other Ambulatory Visit: Payer: Self-pay | Admitting: Internal Medicine

## 2017-12-27 ENCOUNTER — Telehealth: Payer: Self-pay | Admitting: Internal Medicine

## 2017-12-27 MED ORDER — ALPRAZOLAM 0.5 MG PO TABS: ORAL_TABLET | ORAL | 0 refills | Status: DC

## 2017-12-27 NOTE — Telephone Encounter (Signed)
Copied from [place] [id number]. Topic: Quick Communication - Rx Refill/Question >> Dec 27, 2017  9:21 AM Boyd Kerbs wrote: Medication:  ALPRAZolam Duanne Moron) 0.5 MG tablet 90 tablet 2 10/27/2017   Sig: TAKE 1 TABLET BY MOUTH THREE TIMES DAILY. MUST LAST 30 DAYS  Sent to pharmacy as: ALPRAZolam Duanne Moron) 0.5 MG tablet  Can they fill one day early?  Has the patient contacted their pharmacy? Yes.    (Agent: If no, request that the patient contact the pharmacy for the refill.)  Preferred Pharmacy (with phone number or street name):   [facility], [place] [address] [address] Phone: [phone] Fax: [phone]   Agent: Please be advised that RX refills may take up to 3 business days. We ask that you follow-up with your pharmacy.

## 2017-12-27 NOTE — Telephone Encounter (Signed)
Check [place] registry last filled 11/29/2017..lmb  

## 2018-01-05 NOTE — Progress Notes (Signed)
Subjective:    Patient ID: Dawn Kim, female    DOB: 10-17-1945, 72 y.o.   MRN: [id number]  HPI The patient is here for follow up.  The patient is here for follow up for chronic pain management.  Indication for chronic opioid: Chronic back pain and bilateral knee pain from severe arthritis Medication and dose: Vicodin 7.5-325 mg, 1 tablet 2-3 times a day, gabapentin # pills per month: 90 pills of Vicodin Last UDS date: 08/03/17 by preferred pain management Pain contract signed (Y/N): Yes, 10/06/17 Date narcotic database last reviewed (include red flags): 01/06/18, no red flags  Pain assessment:  Pain intensity:  6/10 sitting here now Amount of pain relief with medication: "it helps a lot if she does not have to stand a lot" Use of pain medications: taking medication three times a day Side effects: none Sleep:  Good with trazodone Mood:   Anxious, denies depression Functional/social activities: continues to do minimal things in the home setting   Last took prescribed pain medication:  Alcohol use: None Tobacco use: None Street Drug use: None   Anxiety: She is taking her medication three times a day as prescribed.  On occasion she admits to taking an extra pill.  Her situation at home causes a lot of anxiety-her husband is "difficult".  She denies any side effects from the medication. She feels her anxiety is well controlled and she is happy with her current dose of medication.   Difficulty sleeping: She is taking the trazodone nightly as prescribed.  She feels her sleep is controlled.   Hypertension: She is taking her medication daily. She is compliant with a low sodium diet.  She is not exercising regularly.    Not feeling well:  3-4 weeks ago she started to not feel well.  She has a decreased appetite and has lost weight.  She feels short of breath with exertion and at rest.   The shortness of breath is not worse with exertion.  She is very sedentary.  She feels more  unsteady.  She is unsure why she does not feel well.  She denies depression.  She states she feels her anxiety is controlled, but at other times states that she is under a lot of stress and has anxiety.    Medications and allergies reviewed with patient and updated if appropriate.  Patient Active Problem List   Diagnosis Date Noted  . Encounter for pain management 10/05/2017  . Chronic lower back pain 09/19/2017  . 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 Osteopenia 06/21/2016  . Lower leg edema 05/31/2016  . Hypothyroidism 10/22/2015  . Depression 10/22/2015  . Anxiety 10/22/2015  . GERD (gastroesophageal reflux disease) 10/22/2015  . Insomnia 10/22/2015  . Foot pain, right 10/22/2015  . Bilateral knee pain 10/22/2015  . HTN (hypertension) 01/20/2012  . Lipid disorder 01/20/2012    Current Outpatient Medications on File Prior to Visit  Medication Sig Dispense Refill  . ALPRAZolam (XANAX) 0.5 MG tablet TAKE 1 TABLET BY MOUTH THREE TIMES DAILY. MUST LAST 30 DAYS 90 tablet 0  . calcium carbonate (OS-CAL - DOSED IN MG OF ELEMENTAL CALCIUM) 1250 (500 Ca) MG tablet Take 1 tablet (500 mg of elemental calcium total) by mouth daily.    . cholecalciferol (VITAMIN D) 1000 UNITS tablet Take 1,000 Units by mouth daily.    . diclofenac sodium (VOLTAREN) 1 % GEL APPLY 2 GRAMS TO AFFECTED AREA 2 TIMES A DAY AS NEEDED  FOR PAIN 300 g 1  . ferrous sulfate 325 (65 FE) MG tablet Take 325 mg by mouth daily with breakfast.    . fish oil-omega-3 fatty acids 1000 MG capsule Take 1 g by mouth 2 (two) times daily.     [name] gabapentin (NEURONTIN) 100 MG capsule TAKE 100-200 MG CAPSULES BY MOUTH AT NIGHT IN ADDITION TO 600 MG 180 capsule 0  . gabapentin (NEURONTIN) 300 MG capsule Take 2 capsules (600 mg total) by mouth 2 (two) times daily. 360 capsule 5  . hydrochlorothiazide (HYDRODIURIL) 25 MG tablet TAKE 1 TABLET BY MOUTH ONCE DAILY 90 tablet 1  . HYDROcodone-acetaminophen (NORCO) 7.5-325  MG tablet Take 1 tablet by mouth every 8 (eight) hours as needed. 90 tablet 0  . levothyroxine (SYNTHROID, LEVOTHROID) 112 MCG tablet TAKE 1 TABLET BY MOUTH ONCE DAILY 90 tablet 1  . Multiple Vitamin (MULTIVITAMIN) tablet Take 1 tablet by mouth daily.    [name] omeprazole (PRILOSEC) 40 MG capsule TAKE 1 CAPSULE BY MOUTH ONCE DAILY 90 capsule 3  . ramipril (ALTACE) 10 MG capsule TAKE ONE CAPSULE BY MOUTH ONCE DAILY 90 capsule 3  . simvastatin (ZOCOR) 40 MG tablet TAKE 1 TABLET BY MOUTH IN THE EVENING 90 tablet 1  . traZODone (DESYREL) 100 MG tablet TAKE 1 & 1/2 TO 2 (ONE & ONE-HALF TO TWO) TABLETS BY MOUTH AT BEDTIME 180 tablet 0  . venlafaxine XR (EFFEXOR-XR) 150 MG 24 hr capsule TAKE 1 CAPSULE BY MOUTH TWICE DAILY 180 capsule 1   No current facility-administered medications on file prior to visit.     Past Medical History:  Diagnosis Date  . Anxiety   . Cataract    Bil  . Depression   . Hypercholesteremia   . Hypertension   . Thyroid disease     Past Surgical History:  Procedure Laterality Date  . CHOLECYSTECTOMY    . COLONOSCOPY    . FOOT SURGERY  2011   right foot  . JOINT REPLACEMENT  2010   left knee  . POLYPECTOMY    . TONSILLECTOMY      Social History   Socioeconomic History  . Marital status: Married    Spouse name: Not on file  . Number of children: 3  . Years of education: 74  . Highest education level: Not on file  Occupational History  . Occupation: Retired   Scientific laboratory technician  . Financial resource strain: Not very hard  . Food insecurity:    Worry: Never true    Inability: Never true  . Transportation needs:    Medical: No    Non-medical: No  Tobacco Use  . Smoking status: Former Smoker    Types: Cigarettes    Last attempt to quit: 11/04/2009    Years since quitting: 8.1  . Smokeless tobacco: Never Used  Substance and Sexual Activity  . Alcohol use: No  . Drug use: No  . Sexual activity: Never  Lifestyle  . Physical activity:    Days per week: 0 days      Minutes per session: 0 min  . Stress: Rather much  Relationships  . Social connections:    Talks on phone: Not on file    Gets together: Not on file    Attends religious service: Not on file    Active member of club or organization: Not on file    Attends meetings of clubs or organizations: Not on file    Relationship status: Married  Other Topics Concern  .  Not on file  Social History Narrative  . Not on file    Family History  Problem Relation Age of Onset  . Heart disease Mother   . Drug abuse Sister     Review of Systems  Constitutional: Positive for appetite change (dec) and unexpected weight change. Negative for chills and fever.  HENT: Negative for congestion, ear pain, sinus pain, sore throat and trouble swallowing.   Respiratory: Positive for cough (occ, dry - not new), shortness of breath (with exertion and at rest) and wheezing (mild, occ).   Cardiovascular: Positive for palpitations (sometimess). Negative for chest pain and leg swelling.  Gastrointestinal: Negative for abdominal pain, blood in stool, constipation, diarrhea and nausea.       GERD - couple of times a week  Genitourinary: Positive for frequency. Negative for dysuria and hematuria.  Musculoskeletal: Positive for gait problem (feels unsteady).  Neurological: Positive for light-headedness and headaches (mild).  Psychiatric/Behavioral: Negative for self-injury and suicidal ideas.       Objective:   Vitals:   01/06/18 1359  BP: 124/80  Pulse: 76  Resp: 18  Temp: 97.9 F (36.6 C)  SpO2: 94%   BP Readings from Last 3 Encounters:  01/06/18 124/80  10/06/17 (!) 144/80  09/19/17 120/76   Wt Readings from Last 3 Encounters:  01/06/18 193 lb (87.5 kg)  10/06/17 209 lb (94.8 kg)  09/19/17 204 lb (92.5 kg)   Body mass index is 34.74 kg/m.   Physical Exam    Constitutional: Appears well-developed and well-nourished. No distress.  HENT:  Head: Normocephalic and atraumatic.  Neck: Neck  supple. No tracheal deviation present. No thyromegaly present.  No cervical lymphadenopathy Cardiovascular: Normal rate, regular rhythm and normal heart sounds.   No murmur heard. No carotid bruit .  No edema Pulmonary/Chest: Effort normal and breath sounds normal. No respiratory distress. No has no wheezes. No rales.  Skin: Skin is warm and dry. Not diaphoretic.  Psychiatric: Normal mood and affect. Behavior is normal.      Assessment & Plan:    See Problem List for Assessment and Plan of chronic medical problems.

## 2018-01-05 NOTE — Patient Instructions (Addendum)
  Test(s) ordered today. Your results will be released to MyChart (or called to you) after review, usually within 72hours after test completion. If any changes need to be made, you will be notified at that same time.   Medications reviewed and updated.  No changes recommended at this time.    Please followup in 3 months   

## 2018-01-06 ENCOUNTER — Ambulatory Visit (INDEPENDENT_AMBULATORY_CARE_PROVIDER_SITE_OTHER)
Admission: RE | Admit: 2018-01-06 | Discharge: 2018-01-06 | Disposition: A | Discharge status: Home / Self Care | Payer: Medicare Other | Source: Ambulatory Visit | Attending: Internal Medicine | Admitting: Internal Medicine

## 2018-01-06 ENCOUNTER — Ambulatory Visit: Payer: Medicare Other | Admitting: Internal Medicine

## 2018-01-06 ENCOUNTER — Other Ambulatory Visit (INDEPENDENT_AMBULATORY_CARE_PROVIDER_SITE_OTHER): Payer: Medicare Other

## 2018-01-06 ENCOUNTER — Encounter: Payer: Self-pay | Admitting: Internal Medicine

## 2018-01-06 ENCOUNTER — Other Ambulatory Visit: Payer: Self-pay | Admitting: Internal Medicine

## 2018-01-06 VITALS — BP 124/80 | HR 76 | Temp 97.9°F | Resp 18 | Wt 193.0 lb

## 2018-01-06 DIAGNOSIS — I10 Essential (primary) hypertension: Secondary | ICD-10-CM

## 2018-01-06 DIAGNOSIS — R0602 Shortness of breath: Secondary | ICD-10-CM

## 2018-01-06 DIAGNOSIS — G4700 Insomnia, unspecified: Secondary | ICD-10-CM | POA: Diagnosis not present

## 2018-01-06 DIAGNOSIS — R630 Anorexia: Secondary | ICD-10-CM | POA: Diagnosis not present

## 2018-01-06 DIAGNOSIS — R52 Pain, unspecified: Secondary | ICD-10-CM | POA: Diagnosis not present

## 2018-01-06 DIAGNOSIS — R0600 Dyspnea, unspecified: Secondary | ICD-10-CM | POA: Diagnosis not present

## 2018-01-06 DIAGNOSIS — M25561 Pain in right knee: Secondary | ICD-10-CM

## 2018-01-06 DIAGNOSIS — R350 Frequency of micturition: Secondary | ICD-10-CM

## 2018-01-06 DIAGNOSIS — R634 Abnormal weight loss: Secondary | ICD-10-CM

## 2018-01-06 DIAGNOSIS — M25562 Pain in left knee: Secondary | ICD-10-CM

## 2018-01-06 DIAGNOSIS — F419 Anxiety disorder, unspecified: Secondary | ICD-10-CM | POA: Diagnosis not present

## 2018-01-06 DIAGNOSIS — F329 Major depressive disorder, single episode, unspecified: Secondary | ICD-10-CM | POA: Diagnosis not present

## 2018-01-06 DIAGNOSIS — M545 Low back pain: Secondary | ICD-10-CM | POA: Diagnosis not present

## 2018-01-06 DIAGNOSIS — G8929 Other chronic pain: Secondary | ICD-10-CM

## 2018-01-06 DIAGNOSIS — F32A Depression, unspecified: Secondary | ICD-10-CM

## 2018-01-06 LAB — COMPREHENSIVE METABOLIC PANEL
ALBUMIN: 4.3 g/dL (ref 3.5–5.2)
ALK PHOS: 44 U/L (ref 39–117)
ALT: 17 U/L (ref 0–35)
AST: 17 U/L (ref 0–37)
BUN: 17 mg/dL (ref 6–23)
CO2: 33 mEq/L — ABNORMAL HIGH (ref 19–32)
Calcium: 9.6 mg/dL (ref 8.4–10.5)
Chloride: 100 mEq/L (ref 96–112)
Creatinine, Ser: 1.05 mg/dL (ref 0.40–1.20)
GFR: 54.51 mL/min — ABNORMAL LOW (ref >60.00)
Glucose, Bld: 88 mg/dL (ref 70–99)
POTASSIUM: 3.7 meq/L (ref 3.5–5.1)
SODIUM: 141 meq/L (ref 135–145)
TOTAL PROTEIN: 6.9 g/dL (ref 6.0–8.3)
Total Bilirubin: 0.7 mg/dL (ref 0.2–1.2)

## 2018-01-06 LAB — URINALYSIS, ROUTINE W REFLEX MICROSCOPIC
BILIRUBIN URINE: NEGATIVE
Hgb urine dipstick: NEGATIVE
Ketones, ur: NEGATIVE
NITRITE: NEGATIVE
PH: 6 (ref 5.0–8.0)
RBC / HPF: NONE SEEN (ref 0–?)
Specific Gravity, Urine: 1.025 (ref 1.000–1.030)
TOTAL PROTEIN, URINE-UPE24: NEGATIVE
Urine Glucose: NEGATIVE
Urobilinogen, UA: 0.2 (ref 0.0–1.0)

## 2018-01-06 LAB — CBC WITH DIFFERENTIAL/PLATELET
Basophils Absolute: 0 10*3/uL (ref 0.0–0.1)
Basophils Relative: 0.4 % (ref 0.0–3.0)
EOS PCT: 1.4 % (ref 0.0–5.0)
Eosinophils Absolute: 0.1 10*3/uL (ref 0.0–0.7)
HEMATOCRIT: 44.7 % (ref 36.0–46.0)
HEMOGLOBIN: 14.8 g/dL (ref 12.0–15.0)
LYMPHS PCT: 28.9 % (ref 12.0–46.0)
Lymphs Abs: 2.7 10*3/uL (ref 0.7–4.0)
MCHC: 33 g/dL (ref 30.0–36.0)
MCV: 92.5 fl (ref 78.0–100.0)
MONO ABS: 0.7 10*3/uL (ref 0.1–1.0)
Monocytes Relative: 7 % (ref 3.0–12.0)
Neutro Abs: 5.9 10*3/uL (ref 1.4–7.7)
Neutrophils Relative %: 62.3 % (ref 43.0–77.0)
Platelets: 280 10*3/uL (ref 150.0–400.0)
RBC: 4.84 Mil/uL (ref 3.87–5.11)
RDW: 12.7 % (ref 11.5–15.5)
WBC: 9.5 10*3/uL (ref 4.0–10.5)

## 2018-01-06 LAB — TSH: TSH: 0.74 u[IU]/mL (ref 0.35–4.50)

## 2018-01-06 NOTE — Assessment & Plan Note (Signed)
Denies depression-feels it is controlled Continue current medication

## 2018-01-06 NOTE — Telephone Encounter (Signed)
Copied from [place]. Topic: Quick Communication - Rx Refill/Question >> Jan 06, 2018  5:23 PM Marin Olp L wrote: Medication: HYDROcodone-acetaminophen ([place]) 7.5-325 MG tablet  Has the patient contacted their pharmacy? Yes.   (Agent: If no, request that the patient contact the pharmacy for the refill.) Preferred Pharmacy (with phone number or street name): [facility], [address] [place] [address] Phone: [phone] Fax: [phone] Agent: Please be advised that RX refills may take up to 3 business days. We ask that you follow-up with your pharmacy.  Patient forgot to ask Dr. Quay Burow at her visit today for this refill.

## 2018-01-06 NOTE — Assessment & Plan Note (Signed)
Indication for chronic opioid: Chronic back pain and bilateral knee pain from severe arthritis Medication and dose: Vicodin 7.5-325 mg, 1 tablet 2-3 times a day, gabapentin # pills per month: 90 pills of Vicodin Last UDS date: 08/03/17 by preferred pain management Pain contract signed (Y/N): Yes, 10/06/17 Date narcotic database last reviewed (include red flags): 01/06/18, no red flags  Still has knee pain and back pain.  Discussed with her that she will always likely have some pain, but the pain medication is there just to take the edge off and ideally keep her active Discussed with her that I will not increase medication further She is compliant with her medication and taking it appropriately Refill not due today Follow-up in 3 months

## 2018-01-06 NOTE — Assessment & Plan Note (Signed)
Decreased appetite since her last visit unknown reason Has experienced weight loss We will check CBC, CMP, TSH Denies depression Anxiety may be a possible cause of decreased appetite and weight loss Need to rule out other causes Follow-up in 3 months, sooner if needed
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
## 2018-01-06 NOTE — Assessment & Plan Note (Signed)
States shortness of breath at rest and with exertion She is fairly sedentary and that may be the cause Concerned that she is losing weight and eating less Chest x-ray today CBC, CMP, TSH If symptoms continue may need to see cardiology/pulmonary

## 2018-01-06 NOTE — Assessment & Plan Note (Signed)
Currently states her sleeping is controlled Continue current dose of trazodone

## 2018-01-06 NOTE — Assessment & Plan Note (Signed)
BP well controlled Current regimen effective and well tolerated Continue current medications at current doses cmp  

## 2018-01-06 NOTE — Assessment & Plan Note (Signed)
Weight loss since her last visit-related to not eating as much apparently Unsure why she has decreased appetite-denies depression, anxiety could be a possibility We will check blood work to rule out other causes and urinalysis, urine culture to rule out a possible infection CBC, CMP, TSH

## 2018-01-06 NOTE — Assessment & Plan Note (Signed)
Chronic lower back pain Taking pain medication as prescribed Pain is improved with pain medication We will continue current dose of hydrocodone-acetaminophen Follow-up in 3 months

## 2018-01-06 NOTE — Assessment & Plan Note (Signed)
She does have some increased urinary frequency Urinalysis, urine culture

## 2018-01-06 NOTE — Assessment & Plan Note (Signed)
Chronic bilateral knee pain-severe arthritis Pain medication with hydrocodone-acetaminophen is improving her pain and making it more tolerable We will continue to prescribe since she is taking it appropriately Discussed that this will not take away her pain completely and that I will not increase this dose Not due for refill Follow-up in 3 months

## 2018-01-07 LAB — URINE CULTURE
MICRO NUMBER:: 90423076
RESULT: NO GROWTH
SPECIMEN QUALITY:: ADEQUATE

## 2018-01-09 ENCOUNTER — Other Ambulatory Visit: Payer: Self-pay | Admitting: Internal Medicine

## 2018-01-09 DIAGNOSIS — R0602 Shortness of breath: Secondary | ICD-10-CM

## 2018-01-09 MED ORDER — HYDROCODONE-ACETAMINOPHEN 7.5-325 MG PO TABS: 1.0000 | ORAL_TABLET | Freq: Three times a day (TID) | ORAL | 0 refills | Status: DC | PRN

## 2018-01-09 NOTE — Telephone Encounter (Signed)
Norco refill request - She was just in to see Dr. Quay Burow and forgot to ask for a refill on this Rx.  [place], [address]

## 2018-01-09 NOTE — Telephone Encounter (Signed)
[place] Controlled Substance Database checked. Last filled on 12/08/17

## 2018-01-10 ENCOUNTER — Ambulatory Visit: Payer: Self-pay | Admitting: *Deleted
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
## 2018-01-26 ENCOUNTER — Telehealth: Payer: Self-pay | Admitting: Internal Medicine

## 2018-01-26 NOTE — Telephone Encounter (Signed)
Does she need an appt?

## 2018-01-26 NOTE — Telephone Encounter (Signed)
Copied from [place] [id number]. Topic: Quick Communication - Rx Refill/Question >> Jan 26, 2018 10:47 AM Scherrie Gerlach wrote: Medication: traZODone (DESYREL) 100 MG tablet  Pt is taking 2 of this at night along with the gabapentin (NEURONTIN) 100 MG capsule (1) Pt states she is not sleeping at all.  Pt states she really needs something else to help her sleep. (possibly change of med) Pt states she is so sleep deprived and often feels confused due to not getting any sleep. Pt states she has insomnia really bad. Wants to know if this dx can be submitted for her to get the med she needs. Pt states she has tried several meds.   Pt hoping to get today.  [place], [address] [phone] (Phone) [phone] (Fax)

## 2018-01-27 MED ORDER — ESZOPICLONE 3 MG PO TABS: 3.0000 mg | ORAL_TABLET | Freq: Every day | ORAL | 0 refills | Status: DC

## 2018-01-27 NOTE — Telephone Encounter (Signed)
Notified pt w/MD response.../lmb 

## 2018-01-27 NOTE — Telephone Encounter (Signed)
Completed PA on cover-my-meds Key # [id number], waiting on approval status from insurance.Marland KitchenJohny Chess Form: [place] Medicare Part D General Authorization Form

## 2018-01-27 NOTE — Telephone Encounter (Signed)
We can try lunesta - it is a sleep medication.   Stop trazodone.  Try lunesta at night -- sent to pharmacy.

## 2018-01-27 NOTE — Telephone Encounter (Signed)
Pt states that a PA needs to be done with [employer] for pt to get the Lunesta. She states the cost is $305.00 per month and she cannot afford that.

## 2018-01-30 NOTE — Telephone Encounter (Signed)
Rec'd msg stating This request has been approved. [employer] will send a letter to the member and you regarding this decision. Approvedon April 26 Effective from 01/27/2018 through 01/28/2019. Notified pharmacy w/ approval status.Marland KitchenJohny Chess

## 2018-02-06 ENCOUNTER — Other Ambulatory Visit: Payer: Self-pay | Admitting: Internal Medicine

## 2018-02-06 NOTE — Telephone Encounter (Signed)
Copied from [place]. Topic: Quick Communication - See Telephone Encounter >> Feb 06, 2018  2:14 PM Hewitt Shorts wrote: Pt is needing a refill on her Norco she states that it normally is refilled on the 8th and patient is reminding the provider it is due   Best number [place]

## 2018-02-06 NOTE — Telephone Encounter (Signed)
[place] Controlled Substance Database checked. Last filled on 01/09/18 

## 2018-02-07 MED ORDER — HYDROCODONE-ACETAMINOPHEN 7.5-325 MG PO TABS: 1.0000 | ORAL_TABLET | Freq: Three times a day (TID) | ORAL | 0 refills | Status: DC | PRN

## 2018-02-10 ENCOUNTER — Ambulatory Visit (INDEPENDENT_AMBULATORY_CARE_PROVIDER_SITE_OTHER)
Admission: RE | Admit: 2018-02-10 | Discharge: 2018-02-10 | Disposition: A | Discharge status: Home / Self Care | Payer: Medicare Other | Source: Ambulatory Visit | Attending: Internal Medicine | Admitting: Internal Medicine

## 2018-02-10 ENCOUNTER — Ambulatory Visit: Payer: Medicare Other | Admitting: Internal Medicine

## 2018-02-10 ENCOUNTER — Encounter: Payer: Self-pay | Admitting: Internal Medicine

## 2018-02-10 VITALS — BP 130/74 | HR 77 | Temp 98.4°F | Resp 16

## 2018-02-10 DIAGNOSIS — R0602 Shortness of breath: Secondary | ICD-10-CM | POA: Diagnosis not present

## 2018-02-10 DIAGNOSIS — J069 Acute upper respiratory infection, unspecified: Secondary | ICD-10-CM | POA: Insufficient documentation

## 2018-02-10 DIAGNOSIS — R079 Chest pain, unspecified: Secondary | ICD-10-CM | POA: Diagnosis not present

## 2018-02-10 DIAGNOSIS — R05 Cough: Secondary | ICD-10-CM | POA: Diagnosis not present

## 2018-02-10 DIAGNOSIS — G4700 Insomnia, unspecified: Secondary | ICD-10-CM | POA: Diagnosis not present

## 2018-02-10 MED ORDER — BENZONATATE 200 MG PO CAPS: 200.0000 mg | ORAL_CAPSULE | Freq: Three times a day (TID) | ORAL | 0 refills | Status: DC | PRN

## 2018-02-10 NOTE — Patient Instructions (Signed)
Have a chest x-ray today.  We will call you with the results.    Medications reviewed and updated.  Take coricidin cold products and tylenol for symptoms relief.  Take over the counter cough syrup as needed.   A prescription cough pill was sent to your pharmacy.   Your prescription(s) have been submitted to your pharmacy. Please take as directed and contact our office if you believe you are having problem(s) with the medication(s).  Call if no improvement

## 2018-02-10 NOTE — Assessment & Plan Note (Signed)
Likely viral in nature Will check cxr to make sure there is no PNA Symptomatic treatment Tylenol, coricidin cold products Rest, fluids Call if no improvement

## 2018-02-10 NOTE — Progress Notes (Signed)
Subjective:    Patient ID: Dawn Kim, female    DOB: 01/02/1946, 72 y.o.   MRN: [id number]  HPI She is here for an acute visit for cold symptoms.  Her symptoms started about 10 days ago.  She is experiencing cough that is productive of white sputum and chest soreness with the cough.  She has slightly increased SOB and little wheeze.  She has mild nasal congestion and occasional ear pain.  She denies fever/chills, sinus pressure, sore throat and headaches.  She has taken nothing for her symptoms.   Insomnia:  She is taking the lunesta and it is getting her 5 hrs.  She has tried many medications in the past.    Medications and allergies reviewed with patient and updated if appropriate.  Patient Active Problem List   Diagnosis Date Noted  . Decreased appetite 01/06/2018  . Weight loss 01/06/2018  . Shortness of breath 01/06/2018  . Urinary frequency 01/06/2018  . Encounter for pain management 10/05/2017  . Chronic lower back pain 09/19/2017  . 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 Osteopenia 06/21/2016  . Lower leg edema 05/31/2016  . Hypothyroidism 10/22/2015  . Depression 10/22/2015  . Anxiety 10/22/2015  . GERD (gastroesophageal reflux disease) 10/22/2015  . Insomnia 10/22/2015  . Foot pain, right 10/22/2015  . Bilateral knee pain 10/22/2015  . HTN (hypertension) 01/20/2012  . Lipid disorder 01/20/2012    Current Outpatient Medications on File Prior to Visit  Medication Sig Dispense Refill  . ALPRAZolam (XANAX) 0.5 MG tablet TAKE 1 TABLET BY MOUTH THREE TIMES DAILY. MUST LAST 30 DAYS 90 tablet 0  . calcium carbonate (OS-CAL - DOSED IN MG OF ELEMENTAL CALCIUM) 1250 (500 Ca) MG tablet Take 1 tablet (500 mg of elemental calcium total) by mouth daily.    . cholecalciferol (VITAMIN D) 1000 UNITS tablet Take 1,000 Units by mouth daily.    . diclofenac sodium (VOLTAREN) 1 % GEL APPLY 2 GRAMS TO AFFECTED AREA 2 TIMES A DAY AS NEEDED FOR PAIN 300 g 1  .  Eszopiclone 3 MG TABS Take 1 tablet (3 mg total) by mouth at bedtime. Take immediately before bedtime 30 tablet 0  . ferrous sulfate 325 (65 FE) MG tablet Take 325 mg by mouth daily with breakfast.    . fish oil-omega-3 fatty acids 1000 MG capsule Take 1 g by mouth 2 (two) times daily.     [name] gabapentin (NEURONTIN) 100 MG capsule TAKE 100-200 MG CAPSULES BY MOUTH AT NIGHT IN ADDITION TO 600 MG 180 capsule 0  . gabapentin (NEURONTIN) 300 MG capsule Take 2 capsules (600 mg total) by mouth 2 (two) times daily. 360 capsule 5  . hydrochlorothiazide (HYDRODIURIL) 25 MG tablet TAKE 1 TABLET BY MOUTH ONCE DAILY 90 tablet 1  . HYDROcodone-acetaminophen (NORCO) 7.5-325 MG tablet Take 1 tablet by mouth every 8 (eight) hours as needed. 90 tablet 0  . levothyroxine (SYNTHROID, LEVOTHROID) 112 MCG tablet TAKE 1 TABLET BY MOUTH ONCE DAILY 90 tablet 1  . Multiple Vitamin (MULTIVITAMIN) tablet Take 1 tablet by mouth daily.    [name] omeprazole (PRILOSEC) 40 MG capsule TAKE 1 CAPSULE BY MOUTH ONCE DAILY 90 capsule 3  . ramipril (ALTACE) 10 MG capsule TAKE ONE CAPSULE BY MOUTH ONCE DAILY 90 capsule 3  . simvastatin (ZOCOR) 40 MG tablet TAKE 1 TABLET BY MOUTH IN THE EVENING 90 tablet 1  . venlafaxine XR (EFFEXOR-XR) 150 MG 24 hr capsule TAKE 1 CAPSULE  BY MOUTH TWICE DAILY 180 capsule 1   No current facility-administered medications on file prior to visit.     Past Medical History:  Diagnosis Date  . Anxiety   . Cataract    Bil  . Depression   . Hypercholesteremia   . Hypertension   . Thyroid disease     Past Surgical History:  Procedure Laterality Date  . CHOLECYSTECTOMY    . COLONOSCOPY    . FOOT SURGERY  2011   right foot  . JOINT REPLACEMENT  2010   left knee  . POLYPECTOMY    . TONSILLECTOMY      Social History   Socioeconomic History  . Marital status: Married    Spouse name: Not on file  . Number of children: 3  . Years of education: 46  . Highest education level: Not on file    Occupational History  . Occupation: Retired   Scientific laboratory technician  . Financial resource strain: Not very hard  . Food insecurity:    Worry: Never true    Inability: Never true  . Transportation needs:    Medical: No    Non-medical: No  Tobacco Use  . Smoking status: Former Smoker    Types: Cigarettes    Last attempt to quit: 11/04/2009    Years since quitting: 8.2  . Smokeless tobacco: Never Used  Substance and Sexual Activity  . Alcohol use: No  . Drug use: No  . Sexual activity: Never  Lifestyle  . Physical activity:    Days per week: 0 days    Minutes per session: 0 min  . Stress: Rather much  Relationships  . Social connections:    Talks on phone: Not on file    Gets together: Not on file    Attends religious service: Not on file    Active member of club or organization: Not on file    Attends meetings of clubs or organizations: Not on file    Relationship status: Married  Other Topics Concern  . Not on file  Social History Narrative  . Not on file    Family History  Problem Relation Age of Onset  . Heart disease Mother   . Drug abuse Sister     Review of Systems  Constitutional: Negative for chills and fever.  HENT: Positive for congestion and ear pain (intermittent). Negative for sinus pressure, sinus pain and sore throat.   Respiratory: Positive for cough (prod of white sputum), shortness of breath (slighlty worse than baseline) and wheezing (little).   Gastrointestinal: Negative for diarrhea and nausea.  Neurological: Negative for light-headedness and headaches.       Objective:   Vitals:   02/10/18 1328  BP: 130/74  Pulse: 77  Resp: 16  Temp: 98.4 F (36.9 C)  SpO2: 96%   There were no vitals filed for this visit. There is no height or weight on file to calculate BMI.  Wt Readings from Last 3 Encounters:  01/06/18 193 lb (87.5 kg)  10/06/17 209 lb (94.8 kg)  09/19/17 204 lb (92.5 kg)     Physical Exam GENERAL APPEARANCE: Appears stated age,  well appearing, NAD EYES: conjunctiva clear, no icterus HEENT: bilateral tympanic membranes and ear canals normal, oropharynx with mild erythema, no thyromegaly, trachea midline, no cervical or supraclavicular lymphadenopathy LUNGS: Clear to auscultation without wheeze or crackles, unlabored breathing, good air entry bilaterally CARDIOVASCULAR: Normal S1,S2 without murmurs, no edema SKIN: warm, dry  Assessment & Plan:   See Problem List for Assessment and Plan of chronic medical problems.

## 2018-02-10 NOTE — Assessment & Plan Note (Signed)
Switched from trazodone to [place] - getting 5 hrs of sleep Has tried multiple medication - ambien, trazodone, sonata belsomra and seroquel not covered Will continue lunesta since it is getting her 5 hrs of sleep.   She does lay down during the day but does not sleep.

## 2018-02-13 ENCOUNTER — Other Ambulatory Visit: Payer: Self-pay | Admitting: Internal Medicine

## 2018-02-14 ENCOUNTER — Ambulatory Visit: Payer: Self-pay | Admitting: *Deleted

## 2018-02-14 ENCOUNTER — Telehealth: Payer: Self-pay | Admitting: Internal Medicine

## 2018-02-14 MED ORDER — RANITIDINE HCL 150 MG PO TABS: 150.0000 mg | ORAL_TABLET | Freq: Every day | ORAL | 1 refills | Status: DC

## 2018-02-14 NOTE — Addendum Note (Signed)
Addended by: Terence Lux B on: 02/14/2018 04:14 PM   Modules accepted: Orders

## 2018-02-14 NOTE — Telephone Encounter (Signed)
Spoke with pt to inform. RX sent to [facility].

## 2018-02-14 NOTE — Telephone Encounter (Signed)
Pt states that she was in 02/10/18 with Dr. Quay Burow. She is still having the same symptoms but mostly the burning in her esophagus. She has omeprazole but has not been taking thinking you only take it if you have heartburn. Pt is asking is she should take something or come in for an appt. Cardio appt 5/31, GI appt 04/04/18  Patient has soreness at breast bone- she has started taking the Omeprazole daily on 5/12. Patient has noticed a slight improvement- but she still has soreness in her chest. Told patient I would let Dr Quay Burow know that she has started taking her medications daily and see if she has any suggestions. Discussed diet and that she may have to give the medication time to work. I also call GI office for patient to verify she is on the wait list for cancellation and they are going to reach out to her to see if she would like to see another provider for her symptoms.  Reason for Disposition . Difficulty swallowing is a chronic symptom (recurrent or ongoing AND present > 4 weeks)  Answer Assessment - Initial Assessment Questions 1. SYMPTOM: "Are you having difficulty swallowing liquids, solids, or both?"     No- food- patient has to make sure that her food and pills are small 2. ONSET: "When did the swallowing problems begin?"      Year ago 3. CAUSE: "What do you think is causing the problem?"      Throat needs stretching- Dr Henrene Pastor is to see her 4. CHRONIC/RECURRENT: "Is this a new problem for you?"  If no, ask: "How long have you had this problem?" (e.g., days, weeks, months)      New problem- pain is not new- soreness is not new 5. OTHER SYMPTOMS: "Do you have any other symptoms?" (e.g., difficulty breathing, sore throat, swollen tongue, chest pain)     Sore at breast bone- patient has trouble sleeping 6. PREGNANCY: "Is there any chance you are pregnant?" "When was your last menstrual period?"     n/a  Protocols used: SWALLOWING DIFFICULTY-A-AH

## 2018-02-14 NOTE — Telephone Encounter (Signed)
Copied from [place] [id number]. Topic: Quick Communication - See Telephone Encounter >> Feb 14, 2018 12:15 PM Percell Belt A wrote: CRM for notification. See Telephone encounter for: 02/14/18. [employer] has some questions about the diclofenac sodium (VOLTAREN) 1 % GEL [[id number]]  [phone] Option #5

## 2018-02-14 NOTE — Telephone Encounter (Signed)
She is just started taking the omeprazole again 2 days ago.  This can take up to 4 weeks to be completely effective, but I expect her to see improvement over the next week or 2.  She should continue the omeprazole daily.  We can try adding ranitidine 150 mg at bedtime in addition to the omeprazole.  Prescription pending if she wants to try this.

## 2018-02-14 NOTE — Addendum Note (Signed)
Addended by: Binnie Rail on: 02/14/2018 12:09 PM   Modules accepted: Orders

## 2018-02-15 NOTE — Telephone Encounter (Signed)
There is probably not an alternative except for OTC arthritis meds.  She can always check her "book" to see if anything else is covered.

## 2018-02-15 NOTE — Telephone Encounter (Signed)
Patient is calling to see if everything was fixed with [employer] and would like to know if this is going to be called in with a cheaper price she states its $85 more than last year. She would like to know is there something else that can be called in for her instead since this price is so high.

## 2018-02-15 NOTE — Telephone Encounter (Signed)
Is there an alternative?

## 2018-02-15 NOTE — Telephone Encounter (Signed)
Spoke with pt to inform that form was faxed this am.

## 2018-02-15 NOTE — Telephone Encounter (Signed)
Per Aaron Edelman at [facility] called stating the tier exception is also denied on this medication.   Call back [phone] option 5

## 2018-02-15 NOTE — Telephone Encounter (Signed)
Caller name: Elta [place]  Relation to pt: [employer] back number: [phone] option 5    Reason for call:  [facility] called to inform diclofenac sodium (VOLTAREN) 1 % GEL PA was denied.

## 2018-02-16 NOTE — Telephone Encounter (Signed)
LVM informing pt

## 2018-02-17 DIAGNOSIS — R928 Other abnormal and inconclusive findings on diagnostic imaging of breast: Secondary | ICD-10-CM | POA: Diagnosis not present

## 2018-02-22 ENCOUNTER — Other Ambulatory Visit: Payer: Self-pay | Admitting: Internal Medicine

## 2018-02-22 MED ORDER — ALPRAZOLAM 0.5 MG PO TABS: ORAL_TABLET | ORAL | 0 refills | Status: DC

## 2018-02-22 MED ORDER — ESZOPICLONE 3 MG PO TABS: 3.0000 mg | ORAL_TABLET | Freq: Every day | ORAL | 0 refills | Status: DC

## 2018-02-22 NOTE — Telephone Encounter (Signed)
Copied from [place] [id number]. Topic: Quick Communication - Rx Refill/Question >> Feb 22, 2018  7:59 AM Aurelio Brash B wrote: Medication: ALPRAZolam Duanne Moron) 0.5 MG tablet Eszopiclone 3 MG TAB Has the patient contacted their pharmacy? yes (Agent: If no, request that the patient contact the pharmacy for the refill.) (Agent: If yes, when and what did the pharmacy advise?)  Preferred Pharmacy (with phone number or street name)[facility], [place] [phone] (Phone) [phone] (Fax)      Agent: Please be advised that RX refills may take up to 3 business days. We ask that you follow-up with your pharmacy.

## 2018-02-22 NOTE — Telephone Encounter (Signed)
[place] Controlled Substance Database checked. Xanax last filled on 01/26/18. Eszopiclone last filled 01/27/18

## 2018-02-22 NOTE — Telephone Encounter (Signed)
Refill request for Xanax, last filled on 12/27/17 #90 and Eszopiclone, last filled on 01/27/18 #30  LOV: 02/10/18 Dr[facility]row  [facility]    [address]

## 2018-02-26 ENCOUNTER — Other Ambulatory Visit: Payer: Self-pay | Admitting: Internal Medicine

## 2018-03-02 NOTE — Progress Notes (Deleted)
Cardiology Office Note   Date:  03/02/2018   ID:  Dawn, Kim 05-16-1946, MRN [id number]  PCP:  Dawn Rail, MD  Cardiologist:   No primary care provider on file. Referring:  ***  No chief complaint on file.     History of Present Illness: Dawn Kim is a 72 y.o. female who is referred by Dr. Quay Burow for evaluation of SOB.  ***  Past Medical History:  Diagnosis Date  . Anxiety   . Cataract    Bil  . Depression   . Hypercholesteremia   . Hypertension   . Thyroid disease     Past Surgical History:  Procedure Laterality Date  . CHOLECYSTECTOMY    . COLONOSCOPY    . FOOT SURGERY  2011   right foot  . JOINT REPLACEMENT  2010   left knee  . POLYPECTOMY    . TONSILLECTOMY       Current Outpatient Medications  Medication Sig Dispense Refill  . ALPRAZolam (XANAX) 0.5 MG tablet TAKE 1 TABLET BY MOUTH THREE TIMES DAILY. MUST LAST 30 DAYS 90 tablet 0  . benzonatate (TESSALON) 200 MG capsule Take 1 capsule (200 mg total) by mouth 3 (three) times daily as needed for cough. 30 capsule 0  . calcium carbonate (OS-CAL - DOSED IN MG OF ELEMENTAL CALCIUM) 1250 (500 Ca) MG tablet Take 1 tablet (500 mg of elemental calcium total) by mouth daily.    . cholecalciferol (VITAMIN D) 1000 UNITS tablet Take 1,000 Units by mouth daily.    . diclofenac sodium (VOLTAREN) 1 % GEL APPLY 2 GRAMS TO AFFECTED AREA TWICE DAILY AS NEEDED FOR PAIN 300 g 1  . Eszopiclone 3 MG TABS Take 1 tablet (3 mg total) by mouth at bedtime. Take immediately before bedtime 30 tablet 0  . ferrous sulfate 325 (65 FE) MG tablet Take 325 mg by mouth daily with breakfast.    . fish oil-omega-3 fatty acids 1000 MG capsule Take 1 g by mouth 2 (two) times daily.     [name] gabapentin (NEURONTIN) 100 MG capsule TAKE 1 TO 2 CAPSULES BY MOUTH AT NIGHT IN  ADDITION  TO  600  MG 180 capsule 0  . gabapentin (NEURONTIN) 300 MG capsule Take 2 capsules (600 mg total) by mouth 2 (two) times daily. 360 capsule 5  .  hydrochlorothiazide (HYDRODIURIL) 25 MG tablet TAKE 1 TABLET BY MOUTH ONCE DAILY 90 tablet 1  . HYDROcodone-acetaminophen (NORCO) 7.5-325 MG tablet Take 1 tablet by mouth every 8 (eight) hours as needed. 90 tablet 0  . levothyroxine (SYNTHROID, LEVOTHROID) 112 MCG tablet TAKE 1 TABLET BY MOUTH ONCE DAILY 90 tablet 1  . Multiple Vitamin (MULTIVITAMIN) tablet Take 1 tablet by mouth daily.    [name] omeprazole (PRILOSEC) 40 MG capsule TAKE 1 CAPSULE BY MOUTH ONCE DAILY 90 capsule 3  . ramipril (ALTACE) 10 MG capsule TAKE ONE CAPSULE BY MOUTH ONCE DAILY 90 capsule 3  . ranitidine (ZANTAC) 150 MG tablet Take 1 tablet (150 mg total) by mouth at bedtime. 90 tablet 1  . simvastatin (ZOCOR) 40 MG tablet TAKE 1 TABLET BY MOUTH IN THE EVENING 90 tablet 1  . venlafaxine XR (EFFEXOR-XR) 150 MG 24 hr capsule TAKE 1 CAPSULE BY MOUTH TWICE DAILY 180 capsule 1   No current facility-administered medications for this visit.     Allergies:   Penicillins and Amoxicillin    Social History:  The patient  reports that she quit smoking about  8 years ago. Her smoking use included cigarettes. She has never used smokeless tobacco. She reports that she does not drink alcohol or use drugs.   Family History:  The patient's ***family history includes Drug abuse in her sister; Heart disease in her mother.    ROS:  Please see the history of present illness.   Otherwise, review of systems are positive for {NONE DEFAULTED:18576::"none"}.   All other systems are reviewed and negative.    PHYSICAL EXAM: VS:  There were no vitals taken for this visit. , BMI There is no height or weight on file to calculate BMI. GENERAL:  Well appearing HEENT:  Pupils equal round and reactive, fundi not visualized, oral mucosa unremarkable NECK:  No jugular venous distention, waveform within normal limits, carotid upstroke brisk and symmetric, no bruits, no thyromegaly LYMPHATICS:  No cervical, inguinal adenopathy LUNGS:  Clear to auscultation  bilaterally BACK:  No CVA tenderness CHEST:  Unremarkable HEART:  PMI not displaced or sustained,S1 and S2 within normal limits, no S3, no S4, no clicks, no rubs, *** murmurs ABD:  Flat, positive bowel sounds normal in frequency in pitch, no bruits, no rebound, no guarding, no midline pulsatile mass, no hepatomegaly, no splenomegaly EXT:  2 plus pulses throughout, no edema, no cyanosis no clubbing SKIN:  No rashes no nodules NEURO:  Cranial nerves II through XII grossly intact, motor grossly intact throughout PSYCH:  Cognitively intact, oriented to person place and time    EKG:  EKG {ACTION; IS/IS KQA:06015615} ordered today. The ekg ordered today demonstrates ***   Recent Labs: 01/06/2018: ALT 17; BUN 17; Creatinine, Ser 1.05; Hemoglobin 14.8; Platelets 280.0; Potassium 3.7; Sodium 141; TSH 0.74    Lipid Panel    Component Value Date/Time   CHOL 154 09/19/2017 1104   TRIG 242.0 (H) 09/19/2017 1104   HDL 47.40 09/19/2017 1104   CHOLHDL 3 09/19/2017 1104   VLDL 48.4 (H) 09/19/2017 1104   LDLCALC 72 09/01/2016 1218   LDLDIRECT 82.0 09/19/2017 1104      Wt Readings from Last 3 Encounters:  01/06/18 193 lb (87.5 kg)  10/06/17 209 lb (94.8 kg)  09/19/17 204 lb (92.5 kg)      Other studies Reviewed: Additional studies/ records that were reviewed today include: ***. Review of the above records demonstrates:  Please see elsewhere in the note.  ***   ASSESSMENT AND PLAN:  ***   Current medicines are reviewed at length with the patient today.  The patient {ACTIONS; HAS/DOES NOT HAVE:19233} concerns regarding medicines.  The following changes have been made:  {PLAN; NO CHANGE:13088:s}  Labs/ tests ordered today include: *** No orders of the defined types were placed in this encounter.    Disposition:   FU with ***    Signed, Minus Breeding, MD  03/02/2018 8:41 PM    [facility]

## 2018-03-03 ENCOUNTER — Ambulatory Visit: Payer: Medicare Other | Admitting: Cardiology

## 2018-03-07 ENCOUNTER — Encounter

## 2018-03-07 ENCOUNTER — Ambulatory Visit: Payer: Self-pay | Admitting: Physician Assistant

## 2018-03-08 ENCOUNTER — Other Ambulatory Visit: Payer: Self-pay | Admitting: Internal Medicine

## 2018-03-08 NOTE — Telephone Encounter (Signed)
Copied from [place] [phone]. Topic: Quick Communication - Rx Refill/Question >> Mar 08, 2018 11:33 AM Synthia Innocent wrote: Medication: HYDROcodone-acetaminophen ([place]) 7.5-325 MG tablet  Has the patient contacted their pharmacy? Yes, was told to call office (Agent: If no, request that the patient contact the pharmacy for the refill.) (Agent: If yes, when and what did the pharmacy advise?)  Preferred Pharmacy (with phone number or street name): [facility] on [address]: Please be advised that RX refills may take up to 3 business days. We ask that you follow-up with your pharmacy.

## 2018-03-09 MED ORDER — HYDROCODONE-ACETAMINOPHEN 7.5-325 MG PO TABS: 1.0000 | ORAL_TABLET | Freq: Three times a day (TID) | ORAL | 0 refills | Status: DC | PRN

## 2018-03-09 NOTE — Telephone Encounter (Signed)
LOV  02/10/18 Dr. Quay Burow Last refill  02/07/18  # 90  0 refill

## 2018-03-10 ENCOUNTER — Other Ambulatory Visit: Payer: Self-pay | Admitting: Internal Medicine

## 2018-03-21 ENCOUNTER — Encounter: Payer: Self-pay | Admitting: Internal Medicine

## 2018-03-21 ENCOUNTER — Ambulatory Visit
Admission: RE | Admit: 2018-03-21 | Discharge: 2018-03-21 | Disposition: A | Discharge status: Home / Self Care | Payer: Medicare Other | Source: Ambulatory Visit | Attending: Internal Medicine | Admitting: Internal Medicine

## 2018-03-21 ENCOUNTER — Ambulatory Visit: Payer: Medicare Other | Admitting: Internal Medicine

## 2018-03-21 VITALS — BP 124/84 | HR 82 | Temp 98.2°F | Resp 18

## 2018-03-21 DIAGNOSIS — M545 Low back pain, unspecified: Secondary | ICD-10-CM

## 2018-03-21 DIAGNOSIS — S0990XA Unspecified injury of head, initial encounter: Secondary | ICD-10-CM | POA: Insufficient documentation

## 2018-03-21 DIAGNOSIS — J029 Acute pharyngitis, unspecified: Secondary | ICD-10-CM

## 2018-03-21 MED ORDER — TIZANIDINE HCL 2 MG PO CAPS: 2.0000 mg | ORAL_CAPSULE | Freq: Three times a day (TID) | ORAL | 0 refills | Status: DC

## 2018-03-21 MED ORDER — LEVOCETIRIZINE DIHYDROCHLORIDE 5 MG PO TABS: 5.0000 mg | ORAL_TABLET | Freq: Every evening | ORAL | 0 refills | Status: DC

## 2018-03-21 NOTE — Assessment & Plan Note (Signed)
Had trauma when she fell in the bathtub 1 month ago No loss of consciousness No symptoms consistent with concussion The back of her head aches, but no other current symptoms Symptomatic treatment

## 2018-03-21 NOTE — Patient Instructions (Addendum)
Have an xray of your lower back today.  We will call you with the results.   Medications reviewed and updated.  Changes include starting a muscle relaxer for your back pain called tizanidine.    I sent xyzal to your pharmacy as well - take at night for one month.  If this is too expensive get over the counter Claritin or the generic and take daily for one month.  This will likely help your sore throat.    Your prescription(s) have been submitted to your pharmacy. Please take as directed and contact our office if you believe you are having problem(s) with the medication(s).   Call if no improvement

## 2018-03-21 NOTE — Assessment & Plan Note (Signed)
No obvious infection Possibly related to allergies, postnasal drip.  No symptoms consistent with sore throat related to GERD Trial of an antihistamine-we will send Xyzal to her pharmacy-advised if this is not covered to take Claritin for 1 month Call if no improvement

## 2018-03-21 NOTE — Assessment & Plan Note (Signed)
Acute on chronic lower back pain related to fall 1 month ago.  She slipped and fell in the shower.  She is experiencing lower back pain since then-she has no pain at rest, but does have pain with any movement Likely muscular, but will check an x-ray to rule out vertebral fracture Continue salon paz patches We will try tizanidine

## 2018-03-21 NOTE — Progress Notes (Signed)
Subjective:    Patient ID: Dawn Kim, female    DOB: 1946-04-27, 72 y.o.   MRN: [id number]  HPI The patient is here for an acute visit.  Sore throat  She started having cold symptoms two weeks ago.  She has swollen neck glands, sore throat that is intermittent and postnasal drip.She has had a dry cough from the postnasal drip and has heard some wheezing.  She has occasional chest pain associated with that.  She denies any fevers, chills, nasal congestion, ear pain or sinus pain.  She denies headaches.  She has not taken anything for her symptoms.  She denies any heartburn as long she takes medication daily  Fall: She fell the middle of last month-she slipped and fell in the shower.  She has pain in her lower back and it is difficult for her to get up and down.  She hit the back of her head and was dazed, but there was no LOC.  She denies headaches - just the back of her head hurts.   Salon paz patches have helped with the muscle pain in her lower back.  She is not taking anything else for the pain except for her chronic pain medication.   Medications and allergies reviewed with patient and updated if appropriate.  Patient Active Problem List   Diagnosis Date Noted  . Upper respiratory tract infection 02/10/2018  . Decreased appetite 01/06/2018  . Weight loss 01/06/2018  . Shortness of breath 01/06/2018  . Urinary frequency 01/06/2018  . Encounter for pain management 10/05/2017  . Chronic lower back pain 09/19/2017  . 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 Osteopenia 06/21/2016  . Lower leg edema 05/31/2016  . Hypothyroidism 10/22/2015  . Depression 10/22/2015  . Anxiety 10/22/2015  . GERD (gastroesophageal reflux disease) 10/22/2015  . Insomnia 10/22/2015  . Foot pain, right 10/22/2015  . Bilateral knee pain 10/22/2015  . HTN (hypertension) 01/20/2012  . Lipid disorder 01/20/2012    Current Outpatient Medications on File Prior to Visit  Medication  Sig Dispense Refill  . ALPRAZolam (XANAX) 0.5 MG tablet TAKE 1 TABLET BY MOUTH THREE TIMES DAILY. MUST LAST 30 DAYS 90 tablet 0  . benzonatate (TESSALON) 200 MG capsule Take 1 capsule (200 mg total) by mouth 3 (three) times daily as needed for cough. 30 capsule 0  . calcium carbonate (OS-CAL - DOSED IN MG OF ELEMENTAL CALCIUM) 1250 (500 Ca) MG tablet Take 1 tablet (500 mg of elemental calcium total) by mouth daily.    . cholecalciferol (VITAMIN D) 1000 UNITS tablet Take 1,000 Units by mouth daily.    . diclofenac sodium (VOLTAREN) 1 % GEL APPLY 2 GRAMS TO AFFECTED AREA TWICE DAILY AS NEEDED FOR PAIN 300 g 1  . Eszopiclone 3 MG TABS Take 1 tablet (3 mg total) by mouth at bedtime. Take immediately before bedtime 30 tablet 0  . ferrous sulfate 325 (65 FE) MG tablet Take 325 mg by mouth daily with breakfast.    . fish oil-omega-3 fatty acids 1000 MG capsule Take 1 g by mouth 2 (two) times daily.     [name] gabapentin (NEURONTIN) 100 MG capsule TAKE 1 TO 2 CAPSULES BY MOUTH AT NIGHT IN  ADDITION  TO  600  MG 180 capsule 0  . gabapentin (NEURONTIN) 300 MG capsule Take 2 capsules (600 mg total) by mouth 2 (two) times daily. 360 capsule 5  . hydrochlorothiazide (HYDRODIURIL) 25 MG tablet TAKE 1 TABLET  BY MOUTH ONCE DAILY 90 tablet 1  . HYDROcodone-acetaminophen (NORCO) 7.5-325 MG tablet Take 1 tablet by mouth every 8 (eight) hours as needed. 90 tablet 0  . levothyroxine (SYNTHROID, LEVOTHROID) 112 MCG tablet TAKE 1 TABLET BY MOUTH ONCE DAILY 90 tablet 1  . Multiple Vitamin (MULTIVITAMIN) tablet Take 1 tablet by mouth daily.    [name] omeprazole (PRILOSEC) 40 MG capsule TAKE 1 CAPSULE BY MOUTH ONCE DAILY 90 capsule 3  . ramipril (ALTACE) 10 MG capsule TAKE ONE CAPSULE BY MOUTH ONCE DAILY 90 capsule 3  . ranitidine (ZANTAC) 150 MG tablet Take 1 tablet (150 mg total) by mouth at bedtime. 90 tablet 1  . simvastatin (ZOCOR) 40 MG tablet TAKE 1 TABLET BY MOUTH IN THE EVENING 90 tablet 1  . venlafaxine XR (EFFEXOR-XR) 150  MG 24 hr capsule TAKE 1 CAPSULE BY MOUTH TWICE DAILY 180 capsule 1   No current facility-administered medications on file prior to visit.     Past Medical History:  Diagnosis Date  . Anxiety   . Cataract    Bil  . Depression   . Hypercholesteremia   . Hypertension   . Thyroid disease     Past Surgical History:  Procedure Laterality Date  . CHOLECYSTECTOMY    . COLONOSCOPY    . FOOT SURGERY  2011   right foot  . JOINT REPLACEMENT  2010   left knee  . POLYPECTOMY    . TONSILLECTOMY      Social History   Socioeconomic History  . Marital status: Married    Spouse name: Not on file  . Number of children: 3  . Years of education: 56  . Highest education level: Not on file  Occupational History  . Occupation: Retired   Scientific laboratory technician  . Financial resource strain: Not very hard  . Food insecurity:    Worry: Never true    Inability: Never true  . Transportation needs:    Medical: No    Non-medical: No  Tobacco Use  . Smoking status: Former Smoker    Types: Cigarettes    Last attempt to quit: 11/04/2009    Years since quitting: 8.3  . Smokeless tobacco: Never Used  Substance and Sexual Activity  . Alcohol use: No  . Drug use: No  . Sexual activity: Never  Lifestyle  . Physical activity:    Days per week: 0 days    Minutes per session: 0 min  . Stress: Rather much  Relationships  . Social connections:    Talks on phone: Not on file    Gets together: Not on file    Attends religious service: Not on file    Active member of club or organization: Not on file    Attends meetings of clubs or organizations: Not on file    Relationship status: Married  Other Topics Concern  . Not on file  Social History Narrative  . Not on file    Family History  Problem Relation Age of Onset  . Heart disease Mother   . Drug abuse Sister     Review of Systems  Constitutional: Negative for chills and fever.  HENT: Positive for postnasal drip and sore throat. Negative for  congestion, ear pain and sinus pain.        Swollen glands in neck  Eyes: Negative for visual disturbance.  Respiratory: Positive for cough (from PND) and wheezing. Negative for shortness of breath.   Cardiovascular: Positive for chest pain. Negative for palpitations.  Gastrointestinal: Negative for nausea.  Musculoskeletal: Positive for back pain (with movement) and myalgias (in back).  Neurological: Negative for dizziness, light-headedness and headaches.       Objective:   Vitals:   03/21/18 1509  BP: 124/84  Pulse: 82  Resp: 18  Temp: 98.2 F (36.8 C)  SpO2: 93%   BP Readings from Last 3 Encounters:  03/21/18 124/84  02/10/18 130/74  01/06/18 124/80   Wt Readings from Last 3 Encounters:  01/06/18 193 lb (87.5 kg)  10/06/17 209 lb (94.8 kg)  09/19/17 204 lb (92.5 kg)   There is no height or weight on file to calculate BMI.   Physical Exam    GENERAL APPEARANCE: Appears stated age, well appearing, NAD EYES: conjunctiva clear, no icterus HEENT: Slight swelling posterior right head at base of skull without bruising or laceration; bilateral tympanic membranes and ear canals normal, oropharynx with no erythema, no thyromegaly, trachea midline, no cervical or supraclavicular lymphadenopathy LUNGS: Clear to auscultation without wheeze or crackles, unlabored breathing, good air entry bilaterally CARDIOVASCULAR: Normal S1,S2 without murmurs, no edema MSK;  tenderness with palpation in lower back pain, no obvious lumbar deformity SKIN: Warm, dry      Assessment & Plan:    See Problem List for Assessment and Plan of chronic medical problems.

## 2018-03-22 ENCOUNTER — Telehealth: Payer: Self-pay | Admitting: Emergency Medicine

## 2018-03-22 MED ORDER — TIZANIDINE HCL 2 MG PO TABS: 2.0000 mg | ORAL_TABLET | Freq: Three times a day (TID) | ORAL | 0 refills | Status: DC

## 2018-03-22 NOTE — Telephone Encounter (Signed)
Pts insurance would not cover Tizanidine Caps, tablets sent in with Dr Quay Burow approval.

## 2018-03-23 ENCOUNTER — Other Ambulatory Visit: Payer: Self-pay | Admitting: Internal Medicine

## 2018-03-23 MED ORDER — ALPRAZOLAM 0.5 MG PO TABS: ORAL_TABLET | ORAL | 0 refills | Status: DC

## 2018-03-23 MED ORDER — ESZOPICLONE 3 MG PO TABS: 3.0000 mg | ORAL_TABLET | Freq: Every day | ORAL | 0 refills | Status: DC

## 2018-03-23 NOTE — Telephone Encounter (Signed)
Copied from [place] [phone]. Topic: General - Other >> Mar 23, 2018  8:54 AM Alfredia Ferguson R wrote: Reason for CRM: Pt states she was unable to get xray of her back because the machine was down but will get it done on monday  >> Mar 23, 2018  8:56 AM Para Skeans A wrote: Just a FYI!

## 2018-03-23 NOTE — Telephone Encounter (Signed)
Copied from [place] [phone]. Topic: Quick Communication - Rx Refill/Question >> Mar 23, 2018  8:52 AM Alfredia Ferguson R wrote: Medication: Eszopiclone 3 MG TABS [[id number]] ; ALPRAZolam (XANAX) 0.5 MG tablet  Has the patient contacted their pharmacy? Yes (Agent: If no, request that the patient contact the pharmacy for the refill.) (Agent: If yes, when and what did the pharmacy advise?)  Preferred Pharmacy (with phone number or street name): [facility], [place] [phone] (Phone) [phone] (Fax)      Agent: Please be advised that RX refills may take up to 3 business days. We ask that you follow-up with your pharmacy.

## 2018-03-23 NOTE — Telephone Encounter (Signed)
[place] Controlled Substance Database checked. Last filled on 02/23/18- Xanax 02/24/18- Eszopiclone

## 2018-03-24 ENCOUNTER — Telehealth: Payer: Self-pay | Admitting: Internal Medicine

## 2018-03-24 MED ORDER — OMEPRAZOLE 40 MG PO CPDR: 40.0000 mg | DELAYED_RELEASE_CAPSULE | Freq: Two times a day (BID) | ORAL | 3 refills | Status: DC

## 2018-03-24 NOTE — Telephone Encounter (Signed)
Copied from [place]. Topic: Quick Communication - See Telephone Encounter >> Mar 24, 2018 11:34 AM Bea Graff, NT wrote: CRM for notification. See Telephone encounter for: 03/24/18. Pt calling and states her muscle relaxers, Pizanidine, are not working for her back at all. Requesting call back.

## 2018-03-24 NOTE — Telephone Encounter (Signed)
Changed omeprazole to BID - sent to [facility].       We can try methocarbamol instead of the tizanidine if covered if she wants

## 2018-03-24 NOTE — Telephone Encounter (Signed)
Received fax form [facility]. Pt states she takings omeprazole BID but sig states 1 time daily. Please advise

## 2018-03-27 ENCOUNTER — Ambulatory Visit (INDEPENDENT_AMBULATORY_CARE_PROVIDER_SITE_OTHER)
Admission: RE | Admit: 2018-03-27 | Discharge: 2018-03-27 | Disposition: A | Discharge status: Home / Self Care | Payer: Medicare Other | Source: Ambulatory Visit | Attending: Internal Medicine | Admitting: Internal Medicine

## 2018-03-27 DIAGNOSIS — M545 Low back pain: Secondary | ICD-10-CM

## 2018-03-27 MED ORDER — METHOCARBAMOL 500 MG PO TABS: 500.0000 mg | ORAL_TABLET | Freq: Three times a day (TID) | ORAL | 3 refills | Status: DC

## 2018-03-27 NOTE — Telephone Encounter (Signed)
Spoke with pt, she would like methocarbamol sent to [facility]

## 2018-03-27 NOTE — Addendum Note (Signed)
Addended by: Binnie Rail on: 03/27/2018 10:58 AM   Modules accepted: Orders

## 2018-03-28 ENCOUNTER — Encounter: Payer: Self-pay | Admitting: Internal Medicine

## 2018-03-28 ENCOUNTER — Encounter: Payer: Self-pay | Admitting: Emergency Medicine

## 2018-03-28 DIAGNOSIS — S22080A Wedge compression fracture of T11-T12 vertebra, initial encounter for closed fracture: Secondary | ICD-10-CM | POA: Insufficient documentation

## 2018-03-30 ENCOUNTER — Ambulatory Visit: Payer: Medicare Other | Admitting: Cardiology

## 2018-03-31 ENCOUNTER — Telehealth: Payer: Self-pay | Admitting: Emergency Medicine

## 2018-03-31 NOTE — Telephone Encounter (Signed)
Copied from [place] [phone]. Topic: Quick Communication - Other Results >> Mar 30, 2018  2:13 PM [name] wrote: Pt would like xray results from 03-29-18 and also she would like to know the next step

## 2018-03-31 NOTE — Telephone Encounter (Signed)
Spoke with pt to inform.  

## 2018-04-04 ENCOUNTER — Ambulatory Visit: Payer: Self-pay | Admitting: Internal Medicine

## 2018-04-04 ENCOUNTER — Other Ambulatory Visit: Payer: Self-pay | Admitting: Internal Medicine

## 2018-04-04 NOTE — Telephone Encounter (Signed)
Copied from [place] [phone]. Topic: Quick Communication - Rx Refill/Question >> Apr 04, 2018  6:34 PM Neva Seat wrote: HYDROcodone-acetaminophen [facility]) 7.5-325 MG tablet  Refill needed  [facility], [place] [address] Phone: [phone] Fax: [phone]

## 2018-04-04 NOTE — Telephone Encounter (Signed)
Hydrocodone refill Last Refill:03/09/18 #90 Last OV: 03/21/18 PCP: Dr. Quay Burow Pharmacy: [facility]  [place]

## 2018-04-05 MED ORDER — HYDROCODONE-ACETAMINOPHEN 7.5-325 MG PO TABS: 1.0000 | ORAL_TABLET | Freq: Three times a day (TID) | ORAL | 0 refills | Status: DC | PRN

## 2018-04-05 NOTE — Telephone Encounter (Signed)
Check [place] registry last filled 03/09/2018.Marland KitchenJohny Chess

## 2018-04-12 ENCOUNTER — Ambulatory Visit: Payer: Self-pay | Admitting: Internal Medicine

## 2018-04-14 ENCOUNTER — Telehealth: Payer: Self-pay | Admitting: Internal Medicine

## 2018-04-14 NOTE — Telephone Encounter (Signed)
Ok - needs to be seen Q 3 months

## 2018-04-14 NOTE — Telephone Encounter (Signed)
Patient has cancelled her 3 month follow up appt.  Patient states she had a fall in the bathtube and has had issues at home in regard to her husband.  Patient states she is unable to come in at this time but will as soon as she can.  Patient states that alprazolam and eszopiclone will be due for refills around the 21st.  Patient would like to know if Dr. Quay Burow would be able to refill these two meds.

## 2018-04-18 ENCOUNTER — Other Ambulatory Visit: Payer: Self-pay | Admitting: Internal Medicine

## 2018-04-19 NOTE — Telephone Encounter (Signed)
 Controlled Substance Database checked. Last filled on 03/24/18 - Xanax, 03/25/18 - Eszopiclone

## 2018-04-21 ENCOUNTER — Other Ambulatory Visit: Payer: Self-pay | Admitting: Internal Medicine

## 2018-04-29 ENCOUNTER — Other Ambulatory Visit: Payer: Self-pay | Admitting: Internal Medicine

## 2018-05-10 ENCOUNTER — Other Ambulatory Visit: Payer: Self-pay | Admitting: Internal Medicine

## 2018-05-10 NOTE — Telephone Encounter (Signed)
Hydrocodone refill Last Refill:04/05/18 #90 Last OV: 03/21/18 PCP: Dr. Quay Burow Pharmacy: [facility]  [address]

## 2018-05-10 NOTE — Telephone Encounter (Signed)
Copied from [place] [phone]. Topic: Quick Communication - Rx Refill/Question >> May 10, 2018  5:41 PM Waylan Rocher, Lumin L wrote: Medication: HYDROcodone-acetaminophen ([place]) 7.5-325 MG tablet   Has the patient contacted their pharmacy? Yes.   (Agent: If no, request that the patient contact the pharmacy for the refill.) (Agent: If yes, when and what did the pharmacy advise?)  Preferred Pharmacy (with phone number or street name): [facility], [place] [address] Phone: [phone] Fax: [phone]  Agent: Please be advised that RX refills may take up to 3 business days. We ask that you follow-up with your pharmacy.

## 2018-05-11 MED ORDER — HYDROCODONE-ACETAMINOPHEN 7.5-325 MG PO TABS: 1.0000 | ORAL_TABLET | Freq: Three times a day (TID) | ORAL | 0 refills | Status: DC | PRN

## 2018-05-11 NOTE — Telephone Encounter (Signed)
[place] Controlled Substance Database checked. Last filled on 04/05/18

## 2018-05-15 NOTE — Progress Notes (Signed)
Subjective:    Patient ID: Dawn Kim, female    DOB: 10-01-1946, 72 y.o.   MRN: [id number]  HPI The patient is here for follow up for chronic pain management.  She fell in the shower three days ago.  This is the second time she slipped in the shower.  She has a non-slip matt on the floor.  She hit her head in the back.  She thinks she passed out very briefly.  She has bruises in multiple places.  She does have intermittent soreness and a headache in the back of her head, but denies blurry vision, nausea, lightheadedness and dizziness.  She is mostly just sore from falling.  Initially she had some neck pain, but that has resolved.  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 Medication and dose: vicodin 7.5-325 mg, 1 tab 2-3 times a day, gabapentin # pills per month: 90 Last UDS date: 08/03/17 by preferred pain management Pain contract signed (Y/N): Y, 10/06/17 Date narcotic database last reviewed (include red flags): 05/16/18  Pain assessment:  Pain intensity:  8/10  - worse now due to recent fall Amount of pain relief with medication:   Pain medication helps Use of pain medications: taking vicodin 3/day Side effects:   none Sleep: sleep controlled with Lunesta Mood:  Depressed and anxious related to home situation-her husband is very difficult and they do not have a good relationship Functional/social activities: continues to be somewhat active in home setting   Last took prescribed pain medication:   This morning Alcohol use:    none Tobacco use:     none Street Drug use:    none   Insomnia:  He takes lunesta nightly.  She feels her sleep is controlled.   Hypertension: She is taking her medication daily. She is compliant with a low sodium diet.  She denies chest pain, palpitations, edema, shortness of breath.   Frequent urination - she is going a lot, but not much urine when she does go.  She does not think she has an infection.  She  states it does feel uncomfortable at times when she urinates.  Medications and allergies reviewed with patient and updated if appropriate.  Patient Active Problem List   Diagnosis Date Noted  . T12 compression fracture ([place]) 03/28/2018  . Midline low back pain without sciatica 03/21/2018  . Sore throat 03/21/2018  . Head trauma 03/21/2018  . Upper respiratory tract infection 02/10/2018  . Decreased appetite 01/06/2018  . Weight loss 01/06/2018  . Shortness of breath 01/06/2018  . Urinary frequency 01/06/2018  . Encounter for pain management 10/05/2017  . Chronic lower back pain 09/19/2017  . 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 Osteopenia 06/21/2016  . Lower leg edema 05/31/2016  . Hypothyroidism 10/22/2015  . Depression 10/22/2015  . Anxiety 10/22/2015  . GERD (gastroesophageal reflux disease) 10/22/2015  . Insomnia 10/22/2015  . Foot pain, right 10/22/2015  . Bilateral knee pain 10/22/2015  . HTN (hypertension) 01/20/2012  . Lipid disorder 01/20/2012    Current Outpatient Medications on File Prior to Visit  Medication Sig Dispense Refill  . ALPRAZolam (XANAX) 0.5 MG tablet TAKE 1 TABLET BY MOUTH THREE TIMES DAILY MUST  LAST  30  DAYS 90 tablet 0  . calcium carbonate (OS-CAL - DOSED IN MG OF ELEMENTAL CALCIUM) 1250 (500 Ca) MG tablet Take 1 tablet (500 mg of elemental calcium total) by mouth daily.    [name]  cholecalciferol (VITAMIN D) 1000 UNITS tablet Take 1,000 Units by mouth daily.    . Eszopiclone 3 MG TABS TAKE 1 TABLET BY MOUTH EVERY DAY AT BEDTIME **TAKE  IMMEDIATELY  BEFORE  BEDTIME** 30 tablet 0  . ferrous sulfate 325 (65 FE) MG tablet Take 325 mg by mouth daily with breakfast.    . fish oil-omega-3 fatty acids 1000 MG capsule Take 1 g by mouth 2 (two) times daily.     [name] gabapentin (NEURONTIN) 100 MG capsule TAKE 1 TO 2 CAPSULES BY MOUTH AT NIGHT IN  ADDITION  TO  600  MG 180 capsule 0  . gabapentin (NEURONTIN) 300 MG capsule TAKE 2 CAPSULES BY  MOUTH TWICE DAILY 360 capsule 2  . hydrochlorothiazide (HYDRODIURIL) 25 MG tablet TAKE 1 TABLET BY MOUTH ONCE DAILY 90 tablet 0  . HYDROcodone-acetaminophen (NORCO) 7.5-325 MG tablet Take 1 tablet by mouth every 8 (eight) hours as needed. -- Office visit needed for further refills 90 tablet 0  . levocetirizine (XYZAL) 5 MG tablet Take 1 tablet (5 mg total) by mouth every evening. 30 tablet 0  . levothyroxine (SYNTHROID, LEVOTHROID) 112 MCG tablet TAKE 1 TABLET BY MOUTH ONCE DAILY 90 tablet 1  . methocarbamol (ROBAXIN) 500 MG tablet Take 1 tablet (500 mg total) by mouth 3 (three) times daily. 90 tablet 3  . Multiple Vitamin (MULTIVITAMIN) tablet Take 1 tablet by mouth daily.    [name] omeprazole (PRILOSEC) 40 MG capsule Take 1 capsule (40 mg total) by mouth 2 (two) times daily before a meal. 180 capsule 3  . ramipril (ALTACE) 10 MG capsule TAKE ONE CAPSULE BY MOUTH ONCE DAILY 90 capsule 3  . ranitidine (ZANTAC) 150 MG tablet Take 1 tablet (150 mg total) by mouth at bedtime. 90 tablet 1  . simvastatin (ZOCOR) 40 MG tablet TAKE 1 TABLET BY MOUTH IN THE EVENING 90 tablet 1  . tizanidine (ZANAFLEX) 2 MG capsule TAKE 1 CAPSULE BY MOUTH THREE TIMES DAILY 30 capsule 0  . venlafaxine XR (EFFEXOR-XR) 150 MG 24 hr capsule TAKE 1 CAPSULE BY MOUTH TWICE DAILY 180 capsule 0   No current facility-administered medications on file prior to visit.     Past Medical History:  Diagnosis Date  . Anxiety   . Cataract    Bil  . Depression   . Hypercholesteremia   . Hypertension   . Thyroid disease     Past Surgical History:  Procedure Laterality Date  . CHOLECYSTECTOMY    . COLONOSCOPY    . FOOT SURGERY  2011   right foot  . JOINT REPLACEMENT  2010   left knee  . POLYPECTOMY    . TONSILLECTOMY      Social History   Socioeconomic History  . Marital status: Married    Spouse name: Not on file  . Number of children: 3  . Years of education: 37  . Highest education level: Not on file  Occupational  History  . Occupation: Retired   Scientific laboratory technician  . Financial resource strain: Not very hard  . Food insecurity:    Worry: Never true    Inability: Never true  . Transportation needs:    Medical: No    Non-medical: No  Tobacco Use  . Smoking status: Former Smoker    Types: Cigarettes    Last attempt to quit: 11/04/2009    Years since quitting: 8.5  . Smokeless tobacco: Never Used  Substance and Sexual Activity  . Alcohol use: No  .  Drug use: No  . Sexual activity: Never  Lifestyle  . Physical activity:    Days per week: 0 days    Minutes per session: 0 min  . Stress: Rather much  Relationships  . Social connections:    Talks on phone: Not on file    Gets together: Not on file    Attends religious service: Not on file    Active member of club or organization: Not on file    Attends meetings of clubs or organizations: Not on file    Relationship status: Married  Other Topics Concern  . Not on file  Social History Narrative  . Not on file    Family History  Problem Relation Age of Onset  . Heart disease Mother   . Drug abuse Sister     Review of Systems  Constitutional: Negative for chills and fever.  Eyes: Negative for visual disturbance.  Respiratory: Negative for shortness of breath.   Cardiovascular: Negative for chest pain, palpitations and leg swelling.  Gastrointestinal: Negative for nausea.  Musculoskeletal: Positive for arthralgias, back pain and gait problem.  Neurological: Positive for headaches (mild since hitting her head). Negative for dizziness and light-headedness.       Objective:   Vitals:   05/16/18 1058  BP: 120/60  Pulse: 80  SpO2: 95%   BP Readings from Last 3 Encounters:  05/16/18 120/60  03/21/18 124/84  02/10/18 130/74   Wt Readings from Last 3 Encounters:  01/06/18 193 lb (87.5 kg)  10/06/17 209 lb (94.8 kg)  09/19/17 204 lb (92.5 kg)   Body mass index is 34.74 kg/m.   Physical Exam    Constitutional: Appears  well-developed and well-nourished. No distress.  HENT:  Head: Normocephalic.  Mild bump posterior right head where she hit her head-tender to touch Neck: Neck supple. No tracheal deviation present. No thyromegaly present.  No cervical lymphadenopathy Cardiovascular: Normal rate, regular rhythm and normal heart sounds.   No murmur heard. No carotid bruit .  No edema Pulmonary/Chest: Effort normal and breath sounds normal. No respiratory distress. No has no wheezes. No rales.  Skin: Skin is warm and dry. Not diaphoretic.  Psychiatric: Normal mood and affect. Behavior is normal.      Assessment & Plan:    See Problem List for Assessment and Plan of chronic medical problems.

## 2018-05-15 NOTE — Patient Instructions (Addendum)
Go to the lab and give a urine sample.  We will call you with the results.    Medications reviewed and updated.  No changes recommended at this time.  Your prescription(s) have been submitted to your pharmacy. Please take as directed and contact our office if you believe you are having problem(s) with the medication(s).   Please followup in 3 months

## 2018-05-16 ENCOUNTER — Other Ambulatory Visit: Payer: Self-pay | Admitting: Internal Medicine

## 2018-05-16 ENCOUNTER — Encounter: Payer: Self-pay | Admitting: Internal Medicine

## 2018-05-16 ENCOUNTER — Other Ambulatory Visit (INDEPENDENT_AMBULATORY_CARE_PROVIDER_SITE_OTHER): Payer: Medicare Other

## 2018-05-16 ENCOUNTER — Ambulatory Visit: Payer: Medicare Other | Admitting: Internal Medicine

## 2018-05-16 VITALS — BP 120/60 | HR 80 | Ht 62.5 in

## 2018-05-16 DIAGNOSIS — S0990XA Unspecified injury of head, initial encounter: Secondary | ICD-10-CM

## 2018-05-16 DIAGNOSIS — G8929 Other chronic pain: Secondary | ICD-10-CM

## 2018-05-16 DIAGNOSIS — M25561 Pain in right knee: Secondary | ICD-10-CM

## 2018-05-16 DIAGNOSIS — M545 Low back pain, unspecified: Secondary | ICD-10-CM

## 2018-05-16 DIAGNOSIS — M25562 Pain in left knee: Secondary | ICD-10-CM

## 2018-05-16 DIAGNOSIS — R52 Pain, unspecified: Secondary | ICD-10-CM

## 2018-05-16 DIAGNOSIS — G4700 Insomnia, unspecified: Secondary | ICD-10-CM

## 2018-05-16 DIAGNOSIS — I10 Essential (primary) hypertension: Secondary | ICD-10-CM

## 2018-05-16 DIAGNOSIS — R350 Frequency of micturition: Secondary | ICD-10-CM | POA: Diagnosis not present

## 2018-05-16 LAB — URINALYSIS, ROUTINE W REFLEX MICROSCOPIC
Bilirubin Urine: NEGATIVE
HGB URINE DIPSTICK: NEGATIVE
Ketones, ur: NEGATIVE
Leukocytes, UA: NEGATIVE
Nitrite: NEGATIVE
RBC / HPF: NONE SEEN (ref 0–?)
Specific Gravity, Urine: 1.025 (ref 1.000–1.030)
Total Protein, Urine: NEGATIVE
Urine Glucose: NEGATIVE
Urobilinogen, UA: 0.2 (ref 0.0–1.0)
pH: 5.5 (ref 5.0–8.0)

## 2018-05-16 NOTE — Telephone Encounter (Signed)
Last refill of lunesta 04/22/18 Last refill of xanax 04/21/18   Per database.

## 2018-05-16 NOTE — Assessment & Plan Note (Signed)
She fell 3 days ago and did hit her head.?  Loss of consciousness She does have soreness in the back of her head with palpation No concerning symptoms today-no imaging needed Discussed fall prevention.  Discussed getting something different for the shower, consider shower chair

## 2018-05-16 NOTE — Assessment & Plan Note (Signed)
Chronic b/l knee pain Taking pain medication as prescribed, no aberrant use Pain fairly controlled, no change in regimen Follow up in 3 months

## 2018-05-16 NOTE — Assessment & Plan Note (Signed)
Having frequent urination, intermittent dysuria Check urinalysis, urine culture to rule out infection

## 2018-05-16 NOTE — Assessment & Plan Note (Signed)
Chronic lower back pain Taking pain medication as prescribed, no aberrant use Pain fairly controlled, no change in regimen Follow up in 3 months

## 2018-05-16 NOTE — Assessment & Plan Note (Signed)
BP well controlled Current regimen effective and well tolerated Continue current medications at current doses  

## 2018-05-16 NOTE — Assessment & Plan Note (Signed)
Sleep controlled with current dose of lunesta continue

## 2018-05-16 NOTE — Assessment & Plan Note (Signed)
Pain medication last filled 05/11/18, no do for a refill Continue current pain regimen Pain regimen adequate No evidence of aberrant use of medication Follow up in 3 months

## 2018-05-17 LAB — URINE CULTURE
MICRO NUMBER:: 90958983
SPECIMEN QUALITY: ADEQUATE

## 2018-05-22 ENCOUNTER — Other Ambulatory Visit: Payer: Self-pay | Admitting: Internal Medicine

## 2018-06-06 ENCOUNTER — Emergency Department (HOSPITAL_COMMUNITY): Payer: Medicare Other

## 2018-06-06 ENCOUNTER — Observation Stay (HOSPITAL_COMMUNITY)
Admission: EM | Admit: 2018-06-06 | Discharge: 2018-06-07 | Disposition: A | Discharge status: Home / Self Care | Payer: Medicare Other | Attending: Family Medicine | Admitting: Family Medicine

## 2018-06-06 ENCOUNTER — Encounter (HOSPITAL_COMMUNITY): Payer: Self-pay | Admitting: Emergency Medicine

## 2018-06-06 DIAGNOSIS — R51 Headache: Secondary | ICD-10-CM | POA: Diagnosis not present

## 2018-06-06 DIAGNOSIS — M25552 Pain in left hip: Secondary | ICD-10-CM | POA: Diagnosis not present

## 2018-06-06 DIAGNOSIS — Z96652 Presence of left artificial knee joint: Secondary | ICD-10-CM | POA: Insufficient documentation

## 2018-06-06 DIAGNOSIS — M25551 Pain in right hip: Secondary | ICD-10-CM | POA: Insufficient documentation

## 2018-06-06 DIAGNOSIS — Z79899 Other long term (current) drug therapy: Secondary | ICD-10-CM | POA: Insufficient documentation

## 2018-06-06 DIAGNOSIS — E039 Hypothyroidism, unspecified: Secondary | ICD-10-CM | POA: Diagnosis not present

## 2018-06-06 DIAGNOSIS — T50905A Adverse effect of unspecified drugs, medicaments and biological substances, initial encounter: Secondary | ICD-10-CM

## 2018-06-06 DIAGNOSIS — G4700 Insomnia, unspecified: Secondary | ICD-10-CM | POA: Insufficient documentation

## 2018-06-06 DIAGNOSIS — N179 Acute kidney failure, unspecified: Secondary | ICD-10-CM | POA: Diagnosis not present

## 2018-06-06 DIAGNOSIS — R269 Unspecified abnormalities of gait and mobility: Secondary | ICD-10-CM | POA: Diagnosis not present

## 2018-06-06 DIAGNOSIS — E7800 Pure hypercholesterolemia, unspecified: Secondary | ICD-10-CM | POA: Diagnosis not present

## 2018-06-06 DIAGNOSIS — F329 Major depressive disorder, single episode, unspecified: Secondary | ICD-10-CM | POA: Insufficient documentation

## 2018-06-06 DIAGNOSIS — S79912A Unspecified injury of left hip, initial encounter: Secondary | ICD-10-CM | POA: Diagnosis not present

## 2018-06-06 DIAGNOSIS — Z79890 Hormone replacement therapy (postmenopausal): Secondary | ICD-10-CM | POA: Diagnosis not present

## 2018-06-06 DIAGNOSIS — G8929 Other chronic pain: Secondary | ICD-10-CM | POA: Diagnosis not present

## 2018-06-06 DIAGNOSIS — T887XXA Unspecified adverse effect of drug or medicament, initial encounter: Secondary | ICD-10-CM | POA: Diagnosis not present

## 2018-06-06 DIAGNOSIS — Z87891 Personal history of nicotine dependence: Secondary | ICD-10-CM | POA: Diagnosis not present

## 2018-06-06 DIAGNOSIS — M6281 Muscle weakness (generalized): Secondary | ICD-10-CM | POA: Diagnosis not present

## 2018-06-06 DIAGNOSIS — K219 Gastro-esophageal reflux disease without esophagitis: Secondary | ICD-10-CM | POA: Insufficient documentation

## 2018-06-06 DIAGNOSIS — I10 Essential (primary) hypertension: Secondary | ICD-10-CM | POA: Diagnosis not present

## 2018-06-06 DIAGNOSIS — R296 Repeated falls: Secondary | ICD-10-CM | POA: Diagnosis not present

## 2018-06-06 DIAGNOSIS — M545 Low back pain, unspecified: Secondary | ICD-10-CM | POA: Diagnosis present

## 2018-06-06 DIAGNOSIS — R262 Difficulty in walking, not elsewhere classified: Secondary | ICD-10-CM | POA: Diagnosis not present

## 2018-06-06 DIAGNOSIS — F419 Anxiety disorder, unspecified: Secondary | ICD-10-CM | POA: Diagnosis not present

## 2018-06-06 DIAGNOSIS — R410 Disorientation, unspecified: Secondary | ICD-10-CM | POA: Diagnosis not present

## 2018-06-06 LAB — COMPREHENSIVE METABOLIC PANEL
ALT: 21 U/L (ref 0–44)
AST: 41 U/L (ref 15–41)
Albumin: 3.7 g/dL (ref 3.5–5.0)
Alkaline Phosphatase: 47 U/L (ref 38–126)
Anion gap: 10 (ref 5–15)
BUN: 21 mg/dL (ref 8–23)
CO2: 31 mmol/L (ref 22–32)
Calcium: 8.9 mg/dL (ref 8.9–10.3)
Chloride: 100 mmol/L (ref 98–111)
Creatinine, Ser: 1.8 mg/dL — ABNORMAL HIGH (ref 0.44–1.00)
GFR calc Af Amer: 31 mL/min — ABNORMAL LOW (ref >60)
GFR calc non Af Amer: 27 mL/min — ABNORMAL LOW (ref >60)
Glucose, Bld: 98 mg/dL (ref 70–99)
Potassium: 3.3 mmol/L — ABNORMAL LOW (ref 3.5–5.1)
Sodium: 141 mmol/L (ref 135–145)
Total Bilirubin: 1.2 mg/dL (ref 0.3–1.2)
Total Protein: 6.4 g/dL — ABNORMAL LOW (ref 6.5–8.1)

## 2018-06-06 LAB — CBC WITH DIFFERENTIAL/PLATELET
Basophils Absolute: 0 10*3/uL (ref 0.0–0.1)
Basophils Relative: 0 %
Eosinophils Absolute: 0.2 10*3/uL (ref 0.0–0.7)
Eosinophils Relative: 2 %
HCT: 38.6 % (ref 36.0–46.0)
Hemoglobin: 12.3 g/dL (ref 12.0–15.0)
Lymphocytes Relative: 27 %
Lymphs Abs: 2.3 10*3/uL (ref 0.7–4.0)
MCH: 30.3 pg (ref 26.0–34.0)
MCHC: 31.9 g/dL (ref 30.0–36.0)
MCV: 95.1 fL (ref 78.0–100.0)
Monocytes Absolute: 0.8 10*3/uL (ref 0.1–1.0)
Monocytes Relative: 9 %
Neutro Abs: 5.4 10*3/uL (ref 1.7–7.7)
Neutrophils Relative %: 62 %
Platelets: 271 10*3/uL (ref 150–400)
RBC: 4.06 MIL/uL (ref 3.87–5.11)
RDW: 12.8 % (ref 11.5–15.5)
WBC: 8.6 10*3/uL (ref 4.0–10.5)

## 2018-06-06 LAB — URINALYSIS, ROUTINE W REFLEX MICROSCOPIC
Bacteria, UA: NONE SEEN
Bilirubin Urine: NEGATIVE
Glucose, UA: NEGATIVE mg/dL
Ketones, ur: NEGATIVE mg/dL
Nitrite: NEGATIVE
Protein, ur: NEGATIVE mg/dL
Specific Gravity, Urine: 1.01 (ref 1.005–1.030)
pH: 5 (ref 5.0–8.0)

## 2018-06-06 LAB — T4, FREE: Free T4: 1.98 ng/dL — ABNORMAL HIGH (ref 0.82–1.77)

## 2018-06-06 LAB — AMMONIA: Ammonia: 17 umol/L (ref 9–35)

## 2018-06-06 LAB — TROPONIN I: Troponin I: <0.03 ng/mL (ref <0.03)

## 2018-06-06 LAB — SALICYLATE LEVEL

## 2018-06-06 LAB — TSH: TSH: 0.408 u[IU]/mL (ref 0.350–4.500)

## 2018-06-06 MED ORDER — LORATADINE 10 MG PO TABS: 10.0000 mg | ORAL_TABLET | Freq: Every evening | ORAL | Status: DC

## 2018-06-06 MED ORDER — LORAZEPAM 0.5 MG PO TABS
0.5000 mg | ORAL_TABLET | Freq: Three times a day (TID) | ORAL | Status: DC | PRN
  Administered 2018-06-07: 0.5 mg via ORAL
  Filled 2018-06-06: qty 1

## 2018-06-06 MED ORDER — CALCIUM CARBONATE 1250 (500 CA) MG PO TABS
1.0000 | ORAL_TABLET | Freq: Every day | ORAL | Status: DC
  Administered 2018-06-07: 500 mg via ORAL
  Filled 2018-06-06: qty 1

## 2018-06-06 MED ORDER — VITAMIN D3 25 MCG (1000 UNIT) PO TABS
1000.0000 [IU] | ORAL_TABLET | Freq: Every day | ORAL | Status: DC
  Administered 2018-06-07: 1000 [IU] via ORAL
  Filled 2018-06-06: qty 1

## 2018-06-06 MED ORDER — VENLAFAXINE HCL ER 75 MG PO CP24
150.0000 mg | ORAL_CAPSULE | Freq: Two times a day (BID) | ORAL | Status: DC
  Administered 2018-06-07: 150 mg via ORAL
  Filled 2018-06-06 (×2): qty 2

## 2018-06-06 MED ORDER — ONDANSETRON HCL 4 MG/2ML IJ SOLN: 4.0000 mg | Freq: Four times a day (QID) | INTRAMUSCULAR | Status: DC | PRN

## 2018-06-06 MED ORDER — ENOXAPARIN SODIUM 30 MG/0.3ML ~~LOC~~ SOLN
30.0000 mg | Freq: Every day | SUBCUTANEOUS | Status: DC
  Filled 2018-06-06: qty 0.3

## 2018-06-06 MED ORDER — ACETAMINOPHEN 650 MG RE SUPP: 650.0000 mg | Freq: Four times a day (QID) | RECTAL | Status: DC | PRN

## 2018-06-06 MED ORDER — OMEGA-3-ACID ETHYL ESTERS 1 G PO CAPS
1.0000 g | ORAL_CAPSULE | Freq: Two times a day (BID) | ORAL | Status: DC
  Administered 2018-06-07 (×2): 1 g via ORAL
  Filled 2018-06-06 (×2): qty 1

## 2018-06-06 MED ORDER — SODIUM CHLORIDE 0.9 % IV SOLN
INTRAVENOUS | Status: DC
  Administered 2018-06-07 (×2): via INTRAVENOUS

## 2018-06-06 MED ORDER — LEVOTHYROXINE SODIUM 112 MCG PO TABS
112.0000 ug | ORAL_TABLET | Freq: Every day | ORAL | Status: DC
  Administered 2018-06-07: 112 ug via ORAL
  Filled 2018-06-06: qty 1

## 2018-06-06 MED ORDER — ONDANSETRON HCL 4 MG PO TABS: 4.0000 mg | ORAL_TABLET | Freq: Four times a day (QID) | ORAL | Status: DC | PRN

## 2018-06-06 MED ORDER — ACETAMINOPHEN 325 MG PO TABS
650.0000 mg | ORAL_TABLET | Freq: Once | ORAL | Status: AC
  Administered 2018-06-06: 650 mg via ORAL
  Filled 2018-06-06: qty 2

## 2018-06-06 MED ORDER — FAMOTIDINE 20 MG PO TABS
20.0000 mg | ORAL_TABLET | Freq: Every day | ORAL | Status: DC
  Administered 2018-06-07: 20 mg via ORAL
  Filled 2018-06-06: qty 1

## 2018-06-06 MED ORDER — SIMVASTATIN 40 MG PO TABS: 40.0000 mg | ORAL_TABLET | Freq: Every evening | ORAL | Status: DC

## 2018-06-06 MED ORDER — ADULT MULTIVITAMIN W/MINERALS CH
1.0000 | ORAL_TABLET | Freq: Every day | ORAL | Status: DC
  Administered 2018-06-07: 1 via ORAL
  Filled 2018-06-06: qty 1

## 2018-06-06 MED ORDER — METHOCARBAMOL 500 MG PO TABS: 500.0000 mg | ORAL_TABLET | Freq: Three times a day (TID) | ORAL | Status: DC | PRN

## 2018-06-06 MED ORDER — FERROUS SULFATE 325 (65 FE) MG PO TABS
325.0000 mg | ORAL_TABLET | Freq: Every day | ORAL | Status: DC
  Administered 2018-06-07: 325 mg via ORAL
  Filled 2018-06-06: qty 1

## 2018-06-06 MED ORDER — HYDROCODONE-ACETAMINOPHEN 7.5-325 MG PO TABS: 1.0000 | ORAL_TABLET | Freq: Three times a day (TID) | ORAL | Status: DC | PRN

## 2018-06-06 MED ORDER — ZOLPIDEM TARTRATE 5 MG PO TABS
5.0000 mg | ORAL_TABLET | Freq: Every evening | ORAL | Status: DC | PRN
  Administered 2018-06-07: 5 mg via ORAL
  Filled 2018-06-06: qty 1

## 2018-06-06 MED ORDER — PANTOPRAZOLE SODIUM 40 MG PO TBEC
80.0000 mg | DELAYED_RELEASE_TABLET | Freq: Two times a day (BID) | ORAL | Status: DC
  Administered 2018-06-07: 80 mg via ORAL
  Filled 2018-06-06: qty 2

## 2018-06-06 MED ORDER — ACETAMINOPHEN 325 MG PO TABS: 650.0000 mg | ORAL_TABLET | Freq: Four times a day (QID) | ORAL | Status: DC | PRN

## 2018-06-06 NOTE — ED Provider Notes (Signed)
[facility] DEPT Provider Note   CSN: [id number] Arrival date & time: 06/06/18  1459     History   Chief Complaint Chief Complaint  Patient presents with  . Fall  . Hip Pain    right    HPI Dawn Kim is a 72 y.o. female.  Patient is a 72 year old female with a history of depression, hypertension, thyroid disease who is presenting today with gait difficulty for the past 1 month.  Patient states that for the last 1 month she is having worsening issues with her gait.  She states that she is unable to walk.  She states her legs will not go where she wants them to go which then causes her to fall.  She denies feeling dizzy, lightheaded as the result of the fall.  She states her legs are not giving out on her.  She has fallen numerous times in the last month and it is getting worse.  She is now unable to leave her house because she is so unsteady.  She does not know the cause of why this is happening but it is not getting any better.  She denies any medication changes.  She does complain of getting a lot of headaches but denies taking any anticoagulation.  She does not drink alcohol or use tobacco.  She denies drug use or use of over-the-counter medications.  The history is provided by the patient.  Fall  This is a recurrent problem. Episode onset: 1 month. The problem occurs constantly. The problem has not changed since onset.Associated symptoms comments: Headaches, difficulty walking because she states that her body does not go where she wants to go.  This causes her to fall repeatedly.  No new med changes.  No fever, cough, SOB, chest pain, abd pain, n/v.  . The symptoms are aggravated by walking. Nothing relieves the symptoms. She has tried nothing for the symptoms. The treatment provided no relief.  Hip Pain     Past Medical History:  Diagnosis Date  . Anxiety   . Cataract    Bil  . Depression   . Hypercholesteremia   . Hypertension   . Thyroid  disease     Patient Active Problem List   Diagnosis Date Noted  . T12 compression fracture ([place]) 03/28/2018  . Midline low back pain without sciatica 03/21/2018  . Head trauma 03/21/2018  . Decreased appetite 01/06/2018  . Shortness of breath 01/06/2018  . Frequent urination 01/06/2018  . Encounter for pain management 10/05/2017  . Chronic lower back pain 09/19/2017  . Pain due to total left knee replacement ([place]) 12/09/2016  . Osteopenia 06/21/2016  . Lower leg edema 05/31/2016  . Hypothyroidism 10/22/2015  . Depression 10/22/2015  . Anxiety 10/22/2015  . GERD (gastroesophageal reflux disease) 10/22/2015  . Insomnia 10/22/2015  . Foot pain, right 10/22/2015  . Bilateral knee pain 10/22/2015  . HTN (hypertension) 01/20/2012  . Lipid disorder 01/20/2012    Past Surgical History:  Procedure Laterality Date  . CHOLECYSTECTOMY    . COLONOSCOPY    . FOOT SURGERY  2011   right foot  . JOINT REPLACEMENT  2010   left knee  . POLYPECTOMY    . TONSILLECTOMY       OB History   None      Home Medications    Prior to Admission medications   Medication Sig Start Date End Date Taking? Authorizing Provider  ALPRAZolam (XANAX) 0.5 MG tablet TAKE 1 TABLET BY  MOUTH THREE TIMES DAILY MUST  LAST  30  DAYS 04/20/18   Binnie Rail, MD  ALPRAZolam (XANAX) 0.5 MG tablet TAKE 1 TABLET BY MOUTH THREE TIMES DAILY MUST  LAST  30  DAYS  **DO  NOT  FILL  EARLY** 05/17/18   Burns, Claudina Lick, MD  calcium carbonate (OS-CAL - DOSED IN MG OF ELEMENTAL CALCIUM) 1250 (500 Ca) MG tablet Take 1 tablet (500 mg of elemental calcium total) by mouth daily. 05/31/16   Nche, Charlene Brooke, NP  cholecalciferol (VITAMIN D) 1000 UNITS tablet Take 1,000 Units by mouth daily.    [provider]  Eszopiclone 3 MG TABS TAKE 1 TABLET BY MOUTH EVERY DAY AT BEDTIME **TAKE  IMMEDIATELY  BEFORE  BEDTIME** 04/20/18   Burns, Claudina Lick, MD  Eszopiclone 3 MG TABS TAKE 1 TABLET BY MOUTH EVERY DAY AT BEDTIME **TAKE   IMMEDIATELY  BEFORE  BEDTIME** 05/17/18   Burns, Claudina Lick, MD  fish oil-omega-3 fatty acids 1000 MG capsule Take 1 g by mouth 2 (two) times daily.     [provider]  gabapentin (NEURONTIN) 100 MG capsule TAKE 1 TO 2 CAPSULES BY MOUTH AT NIGHT IN  ADDITION  TO  600  MG 02/28/18   Burns, Claudina Lick, MD  gabapentin (NEURONTIN) 300 MG capsule TAKE 2 CAPSULES BY MOUTH TWICE DAILY 03/23/18   Burns, Claudina Lick, MD  hydrochlorothiazide (HYDRODIURIL) 25 MG tablet TAKE 1 TABLET BY MOUTH ONCE DAILY 05/01/18   Binnie Rail, MD  HYDROcodone-acetaminophen (NORCO) 7.5-325 MG tablet Take 1 tablet by mouth every 8 (eight) hours as needed. -- Office visit needed for further refills 05/11/18   Binnie Rail, MD  levocetirizine (XYZAL) 5 MG tablet Take 1 tablet (5 mg total) by mouth every evening. 03/21/18   Binnie Rail, MD  levothyroxine (SYNTHROID, LEVOTHROID) 112 MCG tablet TAKE 1 TABLET BY MOUTH ONCE DAILY 05/17/18   Binnie Rail, MD  methocarbamol (ROBAXIN) 500 MG tablet Take 1 tablet (500 mg total) by mouth 3 (three) times daily. 03/27/18   Binnie Rail, MD  Multiple Vitamin (MULTIVITAMIN) tablet Take 1 tablet by mouth daily.    [provider]  omeprazole (PRILOSEC) 40 MG capsule Take 1 capsule (40 mg total) by mouth 2 (two) times daily before a meal. 03/24/18   Burns, Claudina Lick, MD  ramipril (ALTACE) 10 MG capsule TAKE ONE CAPSULE BY MOUTH ONCE DAILY 09/21/17   Binnie Rail, MD  ranitidine (ZANTAC) 150 MG tablet Take 1 tablet (150 mg total) by mouth at bedtime. 02/14/18   Binnie Rail, MD  simvastatin (ZOCOR) 40 MG tablet TAKE 1 TABLET BY MOUTH IN THE EVENING 05/22/18   Binnie Rail, MD  venlafaxine XR (EFFEXOR-XR) 150 MG 24 hr capsule TAKE 1 CAPSULE BY MOUTH TWICE DAILY 05/01/18   Binnie Rail, MD    Family History Family History  Problem Relation Age of Onset  . Heart disease Mother   . Drug abuse Sister     Social History Social History   Tobacco Use  . Smoking status: Former  Smoker    Types: Cigarettes    Last attempt to quit: 11/04/2009    Years since quitting: 8.5  . Smokeless tobacco: Never Used  Substance Use Topics  . Alcohol use: No  . Drug use: No     Allergies   Penicillins and Amoxicillin   Review of Systems Review of Systems  All other systems reviewed and are  negative.    Physical Exam Updated Vital Signs BP 97/67 (BP Location: Left Arm)   Pulse 77   Temp 97.9 F (36.6 C) (Oral)   Resp 14   Ht 5\' 2"  (1.575 m)   Wt 84.4 kg   SpO2 96%   BMI 34.02 kg/m   Physical Exam  Constitutional: She is oriented to person, place, and time. She appears well-developed and well-nourished. No distress.  HENT:  Head: Normocephalic and atraumatic.  Mouth/Throat: Oropharynx is clear and moist.  Eyes: Pupils are equal, round, and reactive to light. Conjunctivae and EOM are normal.  Neck: Normal range of motion. Neck supple.  Cardiovascular: Normal rate, regular rhythm and intact distal pulses.  No murmur heard. Pulmonary/Chest: Effort normal and breath sounds normal. No respiratory distress. She has no wheezes. She has no rales.  Abdominal: Soft. She exhibits no distension. There is no tenderness. There is no rebound and no guarding.  Musculoskeletal: Normal range of motion. She exhibits no edema or tenderness.  Neurological: She is alert and oriented to person, place, and time.  Slow to answer questions but appropriate.  Intention tremor when testing strength.  Shuffling unsteady gait.  No pronator drift and neg rhomberg.  Patient has some difficulty with finger-to-nose testing but is able to do it.  She cannot do heel-to-shin testing because she reports she cannot lift her legs off the bed but she does have 4 out of 5 strength bilaterally when helped to assist to get her legs off of the bed.  No aphasia.  No visual field cuts.  Skin: Skin is warm and dry. No rash noted. No erythema.  Psychiatric: Her affect is blunt. Her speech is delayed. She is  slowed.  Nursing note and vitals reviewed.    ED Treatments / Results  Labs (all labs ordered are listed, but only abnormal results are displayed) Labs Reviewed  COMPREHENSIVE METABOLIC PANEL - Abnormal; Notable for the following components:      Result Value   Potassium 3.3 (*)    Creatinine, Ser 1.80 (*)    Total Protein 6.4 (*)    GFR calc non Af Amer 27 (*)    GFR calc Af Amer 31 (*)    All other components within normal limits  CBC WITH DIFFERENTIAL/PLATELET  AMMONIA  TSH  SALICYLATE LEVEL  TROPONIN I  URINALYSIS, ROUTINE W REFLEX MICROSCOPIC  T4, FREE    EKG None  Radiology Dg Chest 2 View  Result Date: 06/06/2018 CLINICAL DATA:  Leg weakness. EXAM: CHEST - 2 VIEW COMPARISON:  Feb 10, 2018 FINDINGS: The heart size and mediastinal contours are within normal limits. Both lungs are clear. The visualized skeletal structures are unremarkable. IMPRESSION: No active cardiopulmonary disease. Electronically Signed   By: Dorise Bullion III M.D   On: 06/06/2018 15:50   Ct Head Wo Contrast  Result Date: 06/06/2018 CLINICAL DATA:  Frequent falls, ataxia, leg weakness, now with right hip pain EXAM: CT HEAD WITHOUT CONTRAST TECHNIQUE: Contiguous axial images were obtained from the base of the skull through the vertex without intravenous contrast. COMPARISON:  CT brain scan of 05/09/2012 FINDINGS: Brain: The ventricular system is within normal limits in size and configuration for age and the cortical sulci are unremarkable. Very little cortical atrophy is seen for age. The septum remains midline in position. The fourth ventricle and basilar cisterns are unremarkable. No hemorrhage, mass lesion, or acute infarction is seen. Vascular: No vascular abnormality is noted on this unenhanced study. Skull: On bone  window images, no acute calvarial abnormality is seen. Sinuses/Orbits: The paranasal sinuses appear well pneumatized. Other: None. IMPRESSION: Negative unenhanced CT of the brain. Very  little atrophy is present considering the patient's age. Electronically Signed   By: Ivar Drape M.D.   On: 06/06/2018 16:22   Dg Hip Unilat W Or Wo Pelvis 2-3 Views Left  Result Date: 06/06/2018 CLINICAL DATA:  Left hip pain after fall. EXAM: DG HIP (WITH OR WITHOUT PELVIS) 2-3V LEFT COMPARISON:  None. FINDINGS: Degenerative disc disease L4-5 with facet arthropathy L4-5 and L5-S1. Intact bony pelvis. Slight joint space narrowing of both hips. Gluteal calcific tendinopathy is seen bilaterally. No fracture or suspicious osseous lesions. IMPRESSION: Lower lumbar degenerative disc and facet arthropathy. Mild degenerative joint space narrowing of both hips. Bilateral gluteal calcific tendinopathy is noted. Electronically Signed   By: Ashley Royalty M.D.   On: 06/06/2018 22:34    Procedures Procedures (including critical care time)  Medications Ordered in ED Medications - No data to display   Initial Impression / Assessment and Plan / ED Course  I have reviewed the triage vital signs and the nursing notes.  Pertinent labs & imaging results that were available during my care of the patient were reviewed by me and considered in my medical decision making (see chart for details).     Patient presenting today with complaints of recurrent falls over the last month.  She describes it as her body will not do what her brain is telling it to.  She states before a month ago she was able to walk independently without difficulty.  She does not use a cane or a walker.  She has been having a lot of headaches recently but states because of her falls she has had multiple times where she has hit her head.  She does not take anticoagulation.  She does take some mind altering medications such as alprazolam and gabapentin but she states that dose has not changed.  She denies any infectious cardiac or respiratory complaints.  On exam she seems slightly encephalopathic with mild asterixis but no pronator drift or unilateral  weakness.  She has a shuffling gait and difficulty remaining steady with walking but denies dizziness.  Some intention tremor noted on exam.  Patient also has a flat affect.  Denies alcohol use.  Concern for possible stroke versus intracranial hemorrhage versus.  Possible development of neurologic condition such as Parkinson's also symptoms could be medication related or psychiatric as a result of depression.  Will ensure no signs of salicylate toxicity or abnormalities with her thyroid as well.  9:23 PM Since head CT and chest x-ray without acute findings.  Patient's labs show normal CBC, mild increase in renal function to 1.8 from less than 1 in April.  Ammonia, TSH, salicylate, troponin within normal limits.  Discussed patient's exam with neurology who felt that patient may have parkinsonian symptoms related to side effects of gabapentin.  He recommended just continuing gabapentin.  Also because patient takes Xanax recommended doing Ativan 0.5 mg 3 times daily to prevent seizures but not having such a long-acting agent.  Will evaluate reflexes.  If patient is hyperactive in the upper and lower extremities we will do a cervical MRI if she is hyperactive and just the lower extremities we will do a thoracic MRI but if she is hypoactive in the lower extremities we will do a lumbar MRI.  9:35 PM On reflex evaluation I do not appreciate hyperreflexia in the upper extremities.  In the left lower extremity normal reflex in the right lower extremity mildly hyperactive.  Will hold on MRI for this time.  Patient is now complaining of significant pain in her left hip.  10:55 PM UA neg for infection and hip imaging neg.  Will admit for further care.  Given tylenol for pain.  Final Clinical Impressions(s) / ED Diagnoses   Final diagnoses:  Multiple falls  Medication reaction, initial encounter    ED Discharge Orders    None       Blanchie Dessert, MD 06/06/18 2256

## 2018-06-06 NOTE — ED Notes (Signed)
Pt said she is still not able urinate

## 2018-06-06 NOTE — ED Notes (Signed)
Patient removed all wires and climbed out of bed. Patient was standing over trash can trying to pee in cup when this nurse walked in to check on pt. Patient redirected to the bed, hooked back up to monitor, and skin tear re-dressed due to pt removing the original dressing. Patient was directed to call staff for help using her call bell that was within reach of pt. Pt stated she understood that we needed to assist her for her own safety.

## 2018-06-06 NOTE — H&P (Signed)
History and Physical    Dawn Kim RUE:[id number] DOB: January 27, 1946 DOA: 06/06/2018  PCP: Binnie Rail, MD  Patient coming from: Home  I have personally briefly reviewed patient's old medical records in [place]  Chief Complaint: Frequent falls  HPI: Dawn Kim is a 72 y.o. female with medical history significant of HTN, chronic back pain, hypothyroidism.  Patient presents to the ED with c/o 1 month h/o gait difficulty, frequent falls.  She states that she is unable to walk.  She states her legs will not go where she wants them to go which then causes her to fall.  She denies feeling dizzy, lightheaded as the result of the fall.  She states her legs are not giving out on her.  She has fallen numerous times in the last month and it is getting worse.  She is now unable to leave her house because she is so unsteady.  She denies any medication changes.   ED Course: Creat 1.8 up from baseline of 1.0 as of April this year.  UA shows 6-10 WBC, trace LE, no bacteria.  She is noted to have shuffling gait and intention tremor.  Neurologist called by EDP, suggested that it might be parkinsonism from the neurontin.  CT head neg, actually looks better than expected for age.  She appears to have intermittent minimal delirium while in ED, attempting to urinate in a trash can per nursing.    Review of Systems: As per HPI otherwise 10 point review of systems negative.   Past Medical History:  Diagnosis Date  . Anxiety   . Cataract    Bil  . Depression   . Hypercholesteremia   . Hypertension   . Thyroid disease     Past Surgical History:  Procedure Laterality Date  . CHOLECYSTECTOMY    . COLONOSCOPY    . FOOT SURGERY  2011   right foot  . JOINT REPLACEMENT  2010   left knee  . POLYPECTOMY    . TONSILLECTOMY       reports that she quit smoking about 8 years ago. Her smoking use included cigarettes. She has never used smokeless tobacco. She reports that she does not  drink alcohol or use drugs.  Allergies  Allergen Reactions  . Penicillins Hives    Has patient had a PCN reaction causing immediate rash, facial/tongue/throat swelling, SOB or lightheadedness with hypotension: No Has patient had a PCN reaction causing severe rash involving mucus membranes or skin necrosis: Yes  Has patient had a PCN reaction that required hospitalization: No Has patient had a PCN reaction occurring within the last 10 years: No  If all of the above answers are "NO", then may proceed with Cephalosporin use.   [name] Amoxicillin Rash    Has patient had a PCN reaction causing immediate rash, facial/tongue/throat swelling, SOB or lightheadedness with hypotension: No  Has patient had a PCN reaction causing severe rash involving mucus membranes or skin necrosis: Yes Has patient had a PCN reaction that required hospitalization No Has patient had a PCN reaction occurring within the last 10 years: No If all of the above answers are "NO", then may proceed with Cephalosporin use.     Family History  Problem Relation Age of Onset  . Heart disease Mother   . Drug abuse Sister      Prior to Admission medications   Medication Sig Start Date End Date Taking? Authorizing Provider  calcium carbonate (OS-CAL - DOSED IN MG OF  ELEMENTAL CALCIUM) 1250 (500 Ca) MG tablet Take 1 tablet (500 mg of elemental calcium total) by mouth daily. 05/31/16  Yes Nche, Charlene Brooke, NP  cholecalciferol (VITAMIN D) 1000 UNITS tablet Take 1,000 Units by mouth daily.   Yes [provider]  Eszopiclone 3 MG TABS TAKE 1 TABLET BY MOUTH EVERY DAY AT BEDTIME **TAKE  IMMEDIATELY  BEFORE  BEDTIME** Patient taking differently: Take 3 mg by mouth at bedtime.  04/20/18  Yes Burns, Claudina Lick, MD  ferrous sulfate 325 (65 FE) MG EC tablet Take 325 mg by mouth daily with breakfast.   Yes [provider]  fish oil-omega-3 fatty acids 1000 MG capsule Take 1 g by mouth 2 (two) times daily.    Yes [provider]  hydrochlorothiazide (HYDRODIURIL) 25 MG tablet TAKE 1 TABLET BY MOUTH ONCE DAILY 05/01/18  Yes Burns, Claudina Lick, MD  HYDROcodone-acetaminophen (NORCO) 7.5-325 MG tablet Take 1 tablet by mouth every 8 (eight) hours as needed. -- Office visit needed for further refills Patient taking differently: Take 1 tablet by mouth every 8 (eight) hours as needed for severe pain. -- Office visit needed for further refills 05/11/18  Yes Burns, Claudina Lick, MD  levocetirizine (XYZAL) 5 MG tablet Take 1 tablet (5 mg total) by mouth every evening. 03/21/18  Yes Burns, Claudina Lick, MD  levothyroxine (SYNTHROID, LEVOTHROID) 112 MCG tablet TAKE 1 TABLET BY MOUTH ONCE DAILY 05/17/18  Yes Burns, Claudina Lick, MD  methocarbamol (ROBAXIN) 500 MG tablet Take 1 tablet (500 mg total) by mouth 3 (three) times daily. 03/27/18  Yes Burns, Claudina Lick, MD  Multiple Vitamin (MULTIVITAMIN) tablet Take 1 tablet by mouth daily.   Yes [provider]  omeprazole (PRILOSEC) 40 MG capsule Take 1 capsule (40 mg total) by mouth 2 (two) times daily before a meal. 03/24/18  Yes Burns, Claudina Lick, MD  ramipril (ALTACE) 10 MG capsule TAKE ONE CAPSULE BY MOUTH ONCE DAILY 09/21/17  Yes Burns, Claudina Lick, MD  ranitidine (ZANTAC) 150 MG tablet Take 1 tablet (150 mg total) by mouth at bedtime. 02/14/18  Yes Burns, Claudina Lick, MD  simvastatin (ZOCOR) 40 MG tablet TAKE 1 TABLET BY MOUTH IN THE EVENING 05/22/18  Yes Burns, Claudina Lick, MD  venlafaxine XR (EFFEXOR-XR) 150 MG 24 hr capsule TAKE 1 CAPSULE BY MOUTH TWICE DAILY 05/01/18  Yes Binnie Rail, MD    Physical Exam: Vitals:   06/06/18 1800 06/06/18 1820 06/06/18 1934 06/06/18 2245  BP: (!) 141/67 (!) 141/67 135/70 (!) 109/53  Pulse:  72 67 69  Resp: 13 13 18 17   Temp:   (!) 97.5 F (36.4 C) (!) 97.4 F (36.3 C)  TempSrc:   Oral Oral  SpO2:  96% 98% 98%  Weight:      Height:        Constitutional: NAD, calm, comfortable Eyes: PERRL, lids and conjunctivae normal ENMT: Mucous membranes are moist.  Posterior pharynx clear of any exudate or lesions.Normal dentition.  Neck: normal, supple, no masses, no thyromegaly Respiratory: clear to auscultation bilaterally, no wheezing, no crackles. Normal respiratory effort. No accessory muscle use.  Cardiovascular: Regular rate and rhythm, no murmurs / rubs / gallops. No extremity edema. 2+ pedal pulses. No carotid bruits.  Abdomen: no tenderness, no masses palpated. No hepatosplenomegaly. Bowel sounds positive.  Musculoskeletal: no clubbing / cyanosis. No joint deformity upper and lower extremities. Good ROM, no contractures. Normal muscle tone.  Skin: no rashes, lesions, ulcers. No induration Neurologic: Slow to answer  questions but appropriate.  Intention tremor when testing strength.  Shuffling unsteady gait.  No pronator drift and neg rhomberg.  Patient has some difficulty with finger-to-nose testing but is able to do it.  She cannot do heel-to-shin testing because she reports she cannot lift her legs off the bed but she does have 4 out of 5 strength bilaterally when helped to assist to get her legs off of the bed.  No aphasia.  No visual field cuts.  Psychiatric: Blunt affect, mentation and speech is delayed and slowed.   Labs on Admission: I have personally reviewed following labs and imaging studies  CBC: Recent Labs  Lab 06/06/18 1603  WBC 8.6  NEUTROABS 5.4  HGB 12.3  HCT 38.6  MCV 95.1  PLT 332   Basic Metabolic Panel: Recent Labs  Lab 06/06/18 1603  NA 141  K 3.3*  CL 100  CO2 31  GLUCOSE 98  BUN 21  CREATININE 1.80*  CALCIUM 8.9   GFR: Estimated Creatinine Clearance: 27.6 mL/min (A) (by C-G formula based on SCr of 1.8 mg/dL (H)). Liver Function Tests: Recent Labs  Lab 06/06/18 1603  AST 41  ALT 21  ALKPHOS 47  BILITOT 1.2  PROT 6.4*  ALBUMIN 3.7   No results for input(s): LIPASE, AMYLASE in the last 168 hours. Recent Labs  Lab 06/06/18 1603  AMMONIA 17   Coagulation Profile: No results for input(s):  INR, PROTIME in the last 168 hours. Cardiac Enzymes: Recent Labs  Lab 06/06/18 1603  TROPONINI <0.03   BNP (last 3 results) No results for input(s): PROBNP in the last 8760 hours. HbA1C: No results for input(s): HGBA1C in the last 72 hours. CBG: No results for input(s): GLUCAP in the last 168 hours. Lipid Profile: No results for input(s): CHOL, HDL, LDLCALC, TRIG, CHOLHDL, LDLDIRECT in the last 72 hours. Thyroid Function Tests: Recent Labs    06/06/18 1603  TSH 0.408  FREET4 1.98*   Anemia Panel: No results for input(s): VITAMINB12, FOLATE, FERRITIN, TIBC, IRON, RETICCTPCT in the last 72 hours. Urine analysis:    Component Value Date/Time   COLORURINE YELLOW 06/06/2018 2111   APPEARANCEUR CLEAR 06/06/2018 2111   LABSPEC 1.010 06/06/2018 2111   PHURINE 5.0 06/06/2018 2111   GLUCOSEU NEGATIVE 06/06/2018 2111   GLUCOSEU NEGATIVE 05/16/2018 1147   HGBUR SMALL (A) 06/06/2018 2111   BILIRUBINUR NEGATIVE 06/06/2018 2111   KETONESUR NEGATIVE 06/06/2018 2111   PROTEINUR NEGATIVE 06/06/2018 2111   UROBILINOGEN 0.2 05/16/2018 1147   NITRITE NEGATIVE 06/06/2018 2111   LEUKOCYTESUR TRACE (A) 06/06/2018 2111    Radiological Exams on Admission: Dg Chest 2 View  Result Date: 06/06/2018 CLINICAL DATA:  Leg weakness. EXAM: CHEST - 2 VIEW COMPARISON:  Feb 10, 2018 FINDINGS: The heart size and mediastinal contours are within normal limits. Both lungs are clear. The visualized skeletal structures are unremarkable. IMPRESSION: No active cardiopulmonary disease. Electronically Signed   By: Dorise Bullion III M.D   On: 06/06/2018 15:50   Ct Head Wo Contrast  Result Date: 06/06/2018 CLINICAL DATA:  Frequent falls, ataxia, leg weakness, now with right hip pain EXAM: CT HEAD WITHOUT CONTRAST TECHNIQUE: Contiguous axial images were obtained from the base of the skull through the vertex without intravenous contrast. COMPARISON:  CT brain scan of 05/09/2012 FINDINGS: Brain: The ventricular system  is within normal limits in size and configuration for age and the cortical sulci are unremarkable. Very little cortical atrophy is seen for age. The septum remains midline in position.  The fourth ventricle and basilar cisterns are unremarkable. No hemorrhage, mass lesion, or acute infarction is seen. Vascular: No vascular abnormality is noted on this unenhanced study. Skull: On bone window images, no acute calvarial abnormality is seen. Sinuses/Orbits: The paranasal sinuses appear well pneumatized. Other: None. IMPRESSION: Negative unenhanced CT of the brain. Very little atrophy is present considering the patient's age. Electronically Signed   By: Ivar Drape M.D.   On: 06/06/2018 16:22   Dg Hip Unilat W Or Wo Pelvis 2-3 Views Left  Result Date: 06/06/2018 CLINICAL DATA:  Left hip pain after fall. EXAM: DG HIP (WITH OR WITHOUT PELVIS) 2-3V LEFT COMPARISON:  None. FINDINGS: Degenerative disc disease L4-5 with facet arthropathy L4-5 and L5-S1. Intact bony pelvis. Slight joint space narrowing of both hips. Gluteal calcific tendinopathy is seen bilaterally. No fracture or suspicious osseous lesions. IMPRESSION: Lower lumbar degenerative disc and facet arthropathy. Mild degenerative joint space narrowing of both hips. Bilateral gluteal calcific tendinopathy is noted. Electronically Signed   By: Ashley Royalty M.D.   On: 06/06/2018 22:34    EKG: Independently reviewed.  Assessment/Plan Principal Problem:   Frequent falls Active Problems:   HTN (hypertension)   Hypothyroidism   Chronic lower back pain   Delirium   AKI (acute kidney injury) ([place])    1. Frequent falls - Likely due to delirium vs parkinsonism secondary to polypharmacy in setting of new AKI. 1. EDP spoke with neurology per their recs: 1. Stop neurontin 2. Change Xanax TID PRN to Ativan TID PRN 2. UCx pending to r/o UTI 3. Will change robaxin to PRN 4. Will leave the PRN Norco and bed time lunesta alone for the moment. 5. PT/OT 2. AKI  - 1. Renal US 2. Hold HCTZ and ARB 3. IVF: NS at 100 4. Repeat BMP in AM 3. HTN - hold HCTZ and ARB as above, BPs actually running on the low side in ED. 4. Hypothyroidism - cont synthroid  DVT prophylaxis: Lovenox Code Status: Full Family Communication: No family in room Disposition Plan: Home after admit Consults called: Neuro curbsided as above Admission status: Place in obs   Kenny Rea, [place] [facility] Pager [phone] Only works nights!  If 7AM-7PM, please contact the primary day team physician taking care of patient  [email or web address] Password [email or web address]  06/06/2018, 11:50 PM

## 2018-06-06 NOTE — ED Triage Notes (Signed)
Pt reports that she having leg weakness and falling all the time over past month. Reports that she fell several days ago and c/o right hip pain.

## 2018-06-07 ENCOUNTER — Other Ambulatory Visit: Payer: Self-pay

## 2018-06-07 ENCOUNTER — Observation Stay (HOSPITAL_COMMUNITY): Payer: Medicare Other

## 2018-06-07 DIAGNOSIS — E038 Other specified hypothyroidism: Secondary | ICD-10-CM | POA: Diagnosis not present

## 2018-06-07 DIAGNOSIS — S3992XA Unspecified injury of lower back, initial encounter: Secondary | ICD-10-CM | POA: Diagnosis not present

## 2018-06-07 DIAGNOSIS — T50905A Adverse effect of unspecified drugs, medicaments and biological substances, initial encounter: Secondary | ICD-10-CM | POA: Diagnosis not present

## 2018-06-07 DIAGNOSIS — M549 Dorsalgia, unspecified: Secondary | ICD-10-CM | POA: Diagnosis not present

## 2018-06-07 DIAGNOSIS — M25551 Pain in right hip: Secondary | ICD-10-CM | POA: Diagnosis not present

## 2018-06-07 DIAGNOSIS — R296 Repeated falls: Secondary | ICD-10-CM | POA: Diagnosis not present

## 2018-06-07 DIAGNOSIS — I10 Essential (primary) hypertension: Secondary | ICD-10-CM

## 2018-06-07 DIAGNOSIS — F329 Major depressive disorder, single episode, unspecified: Secondary | ICD-10-CM | POA: Diagnosis not present

## 2018-06-07 DIAGNOSIS — E7800 Pure hypercholesterolemia, unspecified: Secondary | ICD-10-CM | POA: Diagnosis not present

## 2018-06-07 DIAGNOSIS — E039 Hypothyroidism, unspecified: Secondary | ICD-10-CM | POA: Diagnosis not present

## 2018-06-07 DIAGNOSIS — Z79899 Other long term (current) drug therapy: Secondary | ICD-10-CM | POA: Diagnosis not present

## 2018-06-07 DIAGNOSIS — K219 Gastro-esophageal reflux disease without esophagitis: Secondary | ICD-10-CM | POA: Diagnosis not present

## 2018-06-07 DIAGNOSIS — G8929 Other chronic pain: Secondary | ICD-10-CM | POA: Diagnosis not present

## 2018-06-07 DIAGNOSIS — N179 Acute kidney failure, unspecified: Secondary | ICD-10-CM

## 2018-06-07 DIAGNOSIS — G4700 Insomnia, unspecified: Secondary | ICD-10-CM | POA: Diagnosis not present

## 2018-06-07 DIAGNOSIS — M545 Low back pain: Secondary | ICD-10-CM | POA: Diagnosis not present

## 2018-06-07 DIAGNOSIS — R410 Disorientation, unspecified: Secondary | ICD-10-CM | POA: Diagnosis not present

## 2018-06-07 DIAGNOSIS — F419 Anxiety disorder, unspecified: Secondary | ICD-10-CM | POA: Diagnosis not present

## 2018-06-07 DIAGNOSIS — R269 Unspecified abnormalities of gait and mobility: Secondary | ICD-10-CM | POA: Diagnosis not present

## 2018-06-07 LAB — BASIC METABOLIC PANEL
Anion gap: 10 (ref 5–15)
BUN: 17 mg/dL (ref 8–23)
CO2: 31 mmol/L (ref 22–32)
Calcium: 8.7 mg/dL — ABNORMAL LOW (ref 8.9–10.3)
Chloride: 102 mmol/L (ref 98–111)
Creatinine, Ser: 1.23 mg/dL — ABNORMAL HIGH (ref 0.44–1.00)
GFR, EST AFRICAN AMERICAN: 49 mL/min — AB (ref >60)
GFR, EST NON AFRICAN AMERICAN: 42 mL/min — AB (ref >60)
Glucose, Bld: 98 mg/dL (ref 70–99)
POTASSIUM: 2.9 mmol/L — AB (ref 3.5–5.1)
SODIUM: 143 mmol/L (ref 135–145)

## 2018-06-07 LAB — RAPID URINE DRUG SCREEN, HOSP PERFORMED
AMPHETAMINES: NOT DETECTED
BENZODIAZEPINES: POSITIVE — AB
Barbiturates: NOT DETECTED
Cocaine: NOT DETECTED
OPIATES: POSITIVE — AB
Tetrahydrocannabinol: NOT DETECTED

## 2018-06-07 LAB — MAGNESIUM: MAGNESIUM: 1.9 mg/dL (ref 1.7–2.4)

## 2018-06-07 MED ORDER — ESZOPICLONE 1 MG PO TABS: 1.0000 mg | ORAL_TABLET | Freq: Every day | ORAL | 0 refills | Status: DC

## 2018-06-07 MED ORDER — TRAZODONE HCL 50 MG PO TABS: 25.0000 mg | ORAL_TABLET | Freq: Every day | ORAL | 0 refills | Status: DC

## 2018-06-07 MED ORDER — RAMIPRIL 10 MG PO CAPS: 10.0000 mg | ORAL_CAPSULE | Freq: Every day | ORAL | 3 refills | Status: DC

## 2018-06-07 MED ORDER — POTASSIUM CHLORIDE 10 MEQ/100ML IV SOLN
10.0000 meq | INTRAVENOUS | Status: AC
  Administered 2018-06-07 (×2): 10 meq via INTRAVENOUS
  Filled 2018-06-07 (×3): qty 100

## 2018-06-07 MED ORDER — VENLAFAXINE HCL ER 75 MG PO CP24: 75.0000 mg | ORAL_CAPSULE | Freq: Two times a day (BID) | ORAL | 0 refills | Status: DC

## 2018-06-07 MED ORDER — ENOXAPARIN SODIUM 40 MG/0.4ML ~~LOC~~ SOLN
40.0000 mg | Freq: Every day | SUBCUTANEOUS | Status: DC
  Administered 2018-06-07: 40 mg via SUBCUTANEOUS
  Filled 2018-06-07: qty 0.4

## 2018-06-07 NOTE — Discharge Summary (Signed)
Physician Discharge Summary  Dawn Kim  ZOX:[id number]  DOB: Apr 20, 1946  DOA: 06/06/2018 PCP: Binnie Rail, MD  Admit date: 06/06/2018 Discharge date: 06/07/2018  Admitted From: Home  Disposition: Home   Recommendations for Outpatient Follow-up:  1. Follow up with PCP in 1 week  2. Need to taper off Lunesta, it has been decreased to 1 mg, continue slow taper.  3. Adding trazodone 25 mg qHs 4. May need EMG studies if continues to fall   5. Please obtain BMP/CBC in one week function and hemoglobin. 6. Please follow up on the following pending results:  Home Health: PT/OT  Equipment/Devices: Rolling Walker    Discharge Condition: Stable  CODE STATUS: Full Code  Diet recommendation: Heart Healthy    Brief/Interim Summary: For full details see H&P/Progress note, but in brief, Dawn Kim is a 72 y/o with past medical history of hypertension, chronic back pain, hypothyroidism presented to the emergency department complaining of 1 month of gait instability and frequent falls.  Patient reported that she is walking and changes falls.  She denies feeling dizzy, lightheadedness or weak at the times of the fall.  She does not lose consciousness when she falls.  She did not describe her neck giving out on her.  She reports some tremors.  No other preceding symptoms prior to the fall.  Upon ED evaluation patient was found to have elevated creatinine to 1.8.  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  Patient was placed on observation for AKI and further monitoring.  Subjective: Patient seen and examined, she is very frustrated about continues to fall.  She reports that she is supposed to use rolling walker, however she is very inconsistent with her walker as this intervene her normal activities. She denies weakness, she report back pain, that radiate to her leg, some numbness and tingling  sensation of the R LE. Worked with PT and no shuffle gait was noted. Patient denies ear pain, ringing sensation or blurry vision.   Discharge Diagnoses/Hospital Course:  Principal Problem:   Frequent falls Active Problems:   HTN (hypertension)   Hypothyroidism   Chronic lower back pain   Delirium   AKI (acute kidney injury) ([place])  Frequent falls Felt to be related to polypharmacy in setting of AKI. Patient on Lunesta, gabapentin, Norco and Effexor. Neurology felt that could be parkinsonisms from Neurontin. Will d/c gabapentin. MRI of lumbar spine with no acute abnormalities. No nerve/spine damage. CT head normal. No signs of infections. Need to taper off Lunesta, will decrease to 1 mg qHS for 3 weeks, then can taper by PCP suggest 0.5 mg qHS for 3 more weeks the stop. Will start Trazodone 25 mg qHS. Decrease Effexor to 75 mg BID. Continue norco as needed for back pain.   AKI  Pre renal from dehydration and continuation of ARB/HCTZ  Treated with IVF, Cr back to baseline. Encourage oral hydration.  Check renal function in 1 week   Hypertension BP stable, initially BP in the low side.  BP improve after hydration, resume ARB on 9/5. D/c HCTZ and monitor BP, if need more BP control consider amlodipine. Follow up with PCP.   Hypothyroidism Continue Synthroid  GERD  On Zantac and Protonix, continue for now.   Hypokalemia  Repleted via IV, check BMP in 1 week   All other chronic medical condition were stable during the hospitalization.  Patient was seen by  physical therapy, recommending home health PT  On the day of the discharge the patient's vitals were stable, and no other acute medical condition were reported by patient. the patient was felt safe to be discharge to   Discharge Instructions  You were cared for by a hospitalist during your hospital stay. If you have any questions about your discharge medications or the care you received while you were in the hospital after you are  discharged, you can call the unit and asked to speak with the hospitalist on call if the hospitalist that took care of you is not available. Once you are discharged, your primary care physician will handle any further medical issues. Please note that NO REFILLS for any discharge medications will be authorized once you are discharged, as it is imperative that you return to your primary care physician (or establish a relationship with a primary care physician if you do not have one) for your aftercare needs so that they can reassess your need for medications and monitor your lab values.  Discharge Instructions    Call MD for:  difficulty breathing, headache or visual disturbances   Complete by:  As directed    Call MD for:  extreme fatigue   Complete by:  As directed    Call MD for:  hives   Complete by:  As directed    Call MD for:  persistant dizziness or light-headedness   Complete by:  As directed    Call MD for:  persistant nausea and vomiting   Complete by:  As directed    Call MD for:  redness, tenderness, or signs of infection (pain, swelling, redness, odor or green/yellow discharge around incision site)   Complete by:  As directed    Call MD for:  severe uncontrolled pain   Complete by:  As directed    Call MD for:  temperature >100.4   Complete by:  As directed    Diet - low sodium heart healthy   Complete by:  As directed    Discharge instructions   Complete by:  As directed    STOP GABAPENTIN  Follow up with your primary care physician   Increase activity slowly   Complete by:  As directed      Allergies as of 06/07/2018      Reactions   Penicillins Hives   Has patient had a PCN reaction causing immediate rash, facial/tongue/throat swelling, SOB or lightheadedness with hypotension: No Has patient had a PCN reaction causing severe rash involving mucus membranes or skin necrosis: Yes  Has patient had a PCN reaction that required hospitalization: No Has patient had a PCN  reaction occurring within the last 10 years: No  If all of the above answers are "NO", then may proceed with Cephalosporin use.   Amoxicillin Rash   Has patient had a PCN reaction causing immediate rash, facial/tongue/throat swelling, SOB or lightheadedness with hypotension: No  Has patient had a PCN reaction causing severe rash involving mucus membranes or skin necrosis: Yes Has patient had a PCN reaction that required hospitalization No Has patient had a PCN reaction occurring within the last 10 years: No If all of the above answers are "NO", then may proceed with Cephalosporin use.      Medication List    STOP taking these medications   hydrochlorothiazide 25 MG tablet Commonly known as:  HYDRODIURIL     TAKE these medications   calcium carbonate 1250 (500 Ca) MG tablet Commonly known as:  OS-CAL - dosed in mg of elemental calcium Take 1 tablet (500 mg of elemental calcium total) by mouth daily.   cholecalciferol 1000 units tablet Commonly known as:  VITAMIN D Take 1,000 Units by mouth daily.   eszopiclone 1 MG Tabs tablet Commonly known as:  LUNESTA Take 1 tablet (1 mg total) by mouth at bedtime. Take immediately before bedtime What changed:    medication strength  See the new instructions.   ferrous sulfate 325 (65 FE) MG EC tablet Take 325 mg by mouth daily with breakfast.   fish oil-omega-3 fatty acids 1000 MG capsule Take 1 g by mouth 2 (two) times daily.   HYDROcodone-acetaminophen 7.5-325 MG tablet Commonly known as:  NORCO Take 1 tablet by mouth every 8 (eight) hours as needed. -- Office visit needed for further refills What changed:  reasons to take this   levocetirizine 5 MG tablet Commonly known as:  XYZAL Take 1 tablet (5 mg total) by mouth every evening.   levothyroxine 112 MCG tablet Commonly known as:  SYNTHROID, LEVOTHROID TAKE 1 TABLET BY MOUTH ONCE DAILY   methocarbamol 500 MG tablet Commonly known as:  ROBAXIN Take 1 tablet (500 mg total)  by mouth 3 (three) times daily.   multivitamin tablet Take 1 tablet by mouth daily.   omeprazole 40 MG capsule Commonly known as:  PRILOSEC Take 1 capsule (40 mg total) by mouth 2 (two) times daily before a meal.   ramipril 10 MG capsule Commonly known as:  ALTACE Take 1 capsule (10 mg total) by mouth daily. Start taking on:  06/08/2018   ranitidine 150 MG tablet Commonly known as:  ZANTAC Take 1 tablet (150 mg total) by mouth at bedtime.   simvastatin 40 MG tablet Commonly known as:  ZOCOR TAKE 1 TABLET BY MOUTH IN THE EVENING   traZODone 50 MG tablet Commonly known as:  DESYREL Take 0.5 tablets (25 mg total) by mouth at bedtime.   venlafaxine XR 75 MG 24 hr capsule Commonly known as:  EFFEXOR-XR Take 1 capsule (75 mg total) by mouth 2 (two) times daily. What changed:    medication strength  how much to take      Follow-up Information    Burns, Claudina Lick, MD. Schedule an appointment as soon as possible for a visit in 1 week(s).   Specialty:  Internal Medicine Why:  Hospital follow-up Contact information: [address] [place] [address] [phone]          Allergies  Allergen Reactions  . Penicillins Hives    Has patient had a PCN reaction causing immediate rash, facial/tongue/throat swelling, SOB or lightheadedness with hypotension: No Has patient had a PCN reaction causing severe rash involving mucus membranes or skin necrosis: Yes  Has patient had a PCN reaction that required hospitalization: No Has patient had a PCN reaction occurring within the last 10 years: No  If all of the above answers are "NO", then may proceed with Cephalosporin use.   [name] Amoxicillin Rash    Has patient had a PCN reaction causing immediate rash, facial/tongue/throat swelling, SOB or lightheadedness with hypotension: No  Has patient had a PCN reaction causing severe rash involving mucus membranes or skin necrosis: Yes Has patient had a PCN reaction that required  hospitalization No Has patient had a PCN reaction occurring within the last 10 years: No If all of the above answers are "NO", then may proceed with Cephalosporin use.     Consultations:  None  Procedures/Studies: Dg Chest 2 View  Result Date: 06/06/2018 CLINICAL DATA:  Leg weakness. EXAM: CHEST - 2 VIEW COMPARISON:  Feb 10, 2018 FINDINGS: The heart size and mediastinal contours are within normal limits. Both lungs are clear. The visualized skeletal structures are unremarkable. IMPRESSION: No active cardiopulmonary disease. Electronically Signed   By: Dorise Bullion III M.D   On: 06/06/2018 15:50   Ct Head Wo Contrast  Result Date: 06/06/2018 CLINICAL DATA:  Frequent falls, ataxia, leg weakness, now with right hip pain EXAM: CT HEAD WITHOUT CONTRAST TECHNIQUE: Contiguous axial images were obtained from the base of the skull through the vertex without intravenous contrast. COMPARISON:  CT brain scan of 05/09/2012 FINDINGS: Brain: The ventricular system is within normal limits in size and configuration for age and the cortical sulci are unremarkable. Very little cortical atrophy is seen for age. The septum remains midline in position. The fourth ventricle and basilar cisterns are unremarkable. No hemorrhage, mass lesion, or acute infarction is seen. Vascular: No vascular abnormality is noted on this unenhanced study. Skull: On bone window images, no acute calvarial abnormality is seen. Sinuses/Orbits: The paranasal sinuses appear well pneumatized. Other: None. IMPRESSION: Negative unenhanced CT of the brain. Very little atrophy is present considering the patient's age. Electronically Signed   By: Ivar Drape M.D.   On: 06/06/2018 16:22   Mr Lumbar Spine Wo Contrast  Result Date: 06/07/2018 CLINICAL DATA:  Back pain and hip pain secondary to a fall. EXAM: MRI LUMBAR SPINE WITHOUT CONTRAST TECHNIQUE: Multiplanar, multisequence MR imaging of the lumbar spine was performed. No intravenous contrast  was administered. COMPARISON:  Radiographs dated 06/29/2017 and 03/27/2018 FINDINGS: Segmentation:  Standard.  Tiny ribs at T12. Alignment:  Minimal retrolisthesis of L1 on L2 and of L2 on L3. Vertebrae: There is an old mild benign-appearing compression fracture of the superior endplate of N36, unchanged since 03/27/2018 but new since 06/29/2017. Conus medullaris and cauda equina: Conus extends to the L1 level. Conus and cauda equina appear normal. Paraspinal and other soft tissues: 11 mm simple cyst on the upper pole of the left kidney. Diverticulosis of the sigmoid colon. Disc levels: T12-L1: Tiny broad-based disc bulge with no neural impingement. L1-2: Small broad-based disc bulge slightly compressing the thecal sac symmetrically without focal neural impingement. L2-3: Chronic degenerative disc disease with prominent degenerative changes of the vertebral endplates asymmetric to the left. Minimal disc bulge to the right of midline without neural impingement. L3-4: Tiny disc bulge into the right neural foramen and to the left of midline without focal neural impingement. Slight compression of the left lateral recess best seen on image 25 of series 6. L4-5: Minimal broad-based disc bulge with a small protrusion into the right neural foramen without neural impingement. Minimal degenerative changes of the right facet joint. L5-S1: Normal disc.  Slight bilateral facet arthritis. 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 These are usually not symptomatic. IMPRESSION: 1. No acute abnormalities of the lumbar spine. 2. Old mild benign-appearing compression fracture of the superior endplate of R44, essentially unchanged since the prior radiographs of 03/27/2018. 3. Multilevel mild degenerative disc disease and facet arthritis as described without focal neural impingement. 4. No significant foraminal or spinal stenosis. Electronically Signed   By: Lorriane Shire M.D.   On: 06/07/2018 12:24   US  Renal  Result Date: 06/07/2018 CLINICAL DATA:  Acute kidney injury. EXAM: RENAL / URINARY TRACT ULTRASOUND COMPLETE COMPARISON:  None. FINDINGS: Right Kidney: Length: 8.7 cm.  Echogenicity within normal limits. No mass or hydronephrosis visualized. Left Kidney: Length: 8.8 cm. Echogenicity within normal limits. No mass or hydronephrosis visualized. Bladder: Appears normal for degree of bladder distention. IMPRESSION: Normal ultrasound appearance of the kidneys.  No hydronephrosis. Electronically Signed   By: Lucienne Capers M.D.   On: 06/07/2018 00:53   Dg Hip Unilat W Or Wo Pelvis 2-3 Views Left  Result Date: 06/06/2018 CLINICAL DATA:  Left hip pain after fall. EXAM: DG HIP (WITH OR WITHOUT PELVIS) 2-3V LEFT COMPARISON:  None. FINDINGS: Degenerative disc disease L4-5 with facet arthropathy L4-5 and L5-S1. Intact bony pelvis. Slight joint space narrowing of both hips. Gluteal calcific tendinopathy is seen bilaterally. No fracture or suspicious osseous lesions. IMPRESSION: Lower lumbar degenerative disc and facet arthropathy. Mild degenerative joint space narrowing of both hips. Bilateral gluteal calcific tendinopathy is noted. Electronically Signed   By: Ashley Royalty M.D.   On: 06/06/2018 22:34    Discharge Exam: Vitals:   06/07/18 1116 06/07/18 1118  BP: 136/62 126/65  Pulse: 65 67  Resp:    Temp:    SpO2: 97% 96%   Vitals:   06/07/18 0514 06/07/18 1114 06/07/18 1116 06/07/18 1118  BP: (!) 115/57 127/73 136/62 126/65  Pulse: 65 72 65 67  Resp: 12     Temp:  97.8 F (36.6 C)    TempSrc:  Oral    SpO2: 98% 97% 97% 96%  Weight:      Height:        General: AAOx3 sad and tearful  Cardiovascular: RRR, S1/S2 +, no rubs, no gallops Respiratory: CTA bilaterally, no wheezing, no rhonchi Abdominal: Soft, NT, ND, bowel sounds + Extremities: no edema, no cyanosis Neuro: Non focal, strength 5/5 x 4. Normal reflex, no shuffle gait noted, however multiple pauses while walking. Romberg sign  negative.   The results of significant diagnostics from this hospitalization (including imaging, microbiology, ancillary and laboratory) are listed below for reference.     Microbiology: No results found for this or any previous visit (from the past 240 hour(s)).   Labs: BNP (last 3 results) No results for input(s): BNP in the last 8760 hours. Basic Metabolic Panel: Recent Labs  Lab 06/06/18 1603 06/07/18 0550  NA 141 143  K 3.3* 2.9*  CL 100 102  CO2 31 31  GLUCOSE 98 98  BUN 21 17  CREATININE 1.80* 1.23*  CALCIUM 8.9 8.7*  MG  --  1.9   Liver Function Tests: Recent Labs  Lab 06/06/18 1603  AST 41  ALT 21  ALKPHOS 47  BILITOT 1.2  PROT 6.4*  ALBUMIN 3.7   No results for input(s): LIPASE, AMYLASE in the last 168 hours. Recent Labs  Lab 06/06/18 1603  AMMONIA 17   CBC: Recent Labs  Lab 06/06/18 1603  WBC 8.6  NEUTROABS 5.4  HGB 12.3  HCT 38.6  MCV 95.1  PLT 271   Cardiac Enzymes: Recent Labs  Lab 06/06/18 1603  TROPONINI <0.03   BNP: Invalid input(s): POCBNP CBG: No results for input(s): GLUCAP in the last 168 hours. D-Dimer No results for input(s): DDIMER in the last 72 hours. Hgb A1c No results for input(s): HGBA1C in the last 72 hours. Lipid Profile No results for input(s): CHOL, HDL, LDLCALC, TRIG, CHOLHDL, LDLDIRECT in the last 72 hours. Thyroid function studies Recent Labs    06/06/18 1603  TSH 0.408   Anemia work up No results for input(s): VITAMINB12, FOLATE, FERRITIN, TIBC, IRON, RETICCTPCT in the last  72 hours. Urinalysis    Component Value Date/Time   COLORURINE YELLOW 06/06/2018 2111   APPEARANCEUR CLEAR 06/06/2018 2111   LABSPEC 1.010 06/06/2018 2111   PHURINE 5.0 06/06/2018 2111   GLUCOSEU NEGATIVE 06/06/2018 2111   GLUCOSEU NEGATIVE 05/16/2018 1147   HGBUR SMALL (A) 06/06/2018 2111   BILIRUBINUR NEGATIVE 06/06/2018 2111   KETONESUR NEGATIVE 06/06/2018 2111   PROTEINUR NEGATIVE 06/06/2018 2111   UROBILINOGEN 0.2  05/16/2018 1147   NITRITE NEGATIVE 06/06/2018 2111   LEUKOCYTESUR TRACE (A) 06/06/2018 2111   Sepsis Labs Invalid input(s): PROCALCITONIN,  WBC,  LACTICIDVEN Microbiology No results found for this or any previous visit (from the past 240 hour(s)).  Time coordinating discharge: 35 minutes  SIGNED:  Chipper Oman, MD  [facility] 06/07/2018, 4:19 PM  Pager please text page via  [email or web address]  Note - This record has been created using [employer]. Chart creation errors have been sought, but may not always have been located. Such creation errors do not reflect on the standard of medical care.

## 2018-06-07 NOTE — Evaluation (Signed)
Occupational Therapy Evaluation Patient Details Name: Dawn Kim MRN: [id number] DOB: Apr 29, 1946 Today's Date: 06/07/2018    History of Present Illness 72 yo female admitted with frequent falls, R hip pain, AKI. Brief neuro consult 2* possible Parkinsonism.    Clinical Impression   Pt admitted with the above diagnoses and presents with below problem list. Pt will benefit from continued acute OT to address the below listed deficits and maximize independence with basic ADLs prior to d/c to venue below. PTA pt was independent to mod I with ADLs. Pt currently min guard to min A with LB ADLs and functional mobility/transfers. Feel pt would benefit from [facility] re[facility] in SNF setting but pt declines at this time. Educated on having spouse present to assist with tub shower transfers and OOB/mobility and the importance of continued OT/PT.      Follow Up Recommendations  Home health OT;Supervision - Intermittent;Other (comment)(OOB/mobility)    Equipment Recommendations  None recommended by OT    Recommendations for Other Services       Precautions / Restrictions Precautions Precautions: Fall Precaution Comments: frequent falls for last 2 months per pt Restrictions Weight Bearing Restrictions: No      Mobility Bed Mobility Overal bed mobility: Modified Independent             General bed mobility comments: pt sitting EOB  Transfers Overall transfer level: Needs assistance Equipment used: None Transfers: Sit to/from Stand Sit to Stand: Min guard         General transfer comment: Increaesed time and effort without device. No physical assistance given but pt was unsteady.     Balance Overall balance assessment: Needs assistance;History of Falls Sitting-balance support: No upper extremity supported;Bilateral upper extremity supported Sitting balance-Leahy Scale: Fair     Standing balance support: No upper extremity supported Standing balance-Leahy Scale: Fair Standing  balance comment: unsteady during dynamic standing activites. Seeks exteranl support                           ADL either performed or assessed with clinical judgement   ADL Overall ADL's : Needs assistance/impaired Eating/Feeding: Set up;Sitting   Grooming: Min guard;Standing   Upper Body Bathing: Set up;Sitting   Lower Body Bathing: Minimal assistance;Sit to/from stand   Upper Body Dressing : Set up;Sitting   Lower Body Dressing: Minimal assistance;Sit to/from stand   Toilet Transfer: Min guard;Ambulation;Minimal assistance;Comfort height toilet;Grab bars   Toileting- Clothing Manipulation and Hygiene: Min guard;Minimal assistance;Sit to/from stand   Tub/ Shower Transfer: Minimal assistance;Ambulation;Shower seat   Functional mobility during ADLs: Minimal assistance;Min guard General ADL Comments: Pt completed functional mobility within the room, toilet transfer, stood briefly at sink. Close min guard to min A (to steady during 1 LOB on turning). Recommended pt have someone with her during shower transfers and to consider arranging for installation of grab bar. Also discussed strategies for LB dressing and balance.      Vision         Perception     Praxis      Pertinent Vitals/Pain Pain Assessment: Faces Faces Pain Scale: Hurts even more Pain Location: L hip Pain Descriptors / Indicators: Sore;Sharp Pain Intervention(s): Limited activity within patient's tolerance;Monitored during session;Repositioned     Hand Dominance     Extremity/Trunk Assessment Upper Extremity Assessment Upper Extremity Assessment: Overall WFL for tasks assessed   Lower Extremity Assessment Lower Extremity Assessment: Defer to PT evaluation   Cervical / Trunk Assessment  Cervical / Trunk Assessment: Normal   Communication Communication Communication: No difficulties   Cognition Arousal/Alertness: Awake/alert Behavior During Therapy: Anxious(tearful at times) Overall  Cognitive Status: No family/caregiver present to determine baseline cognitive functioning                                 General Comments: pt answered all questions. She participated well. Appears WFL aside from above behaviors. Internally distracted at times.   General Comments       Exercises     Shoulder Instructions      Home Living Family/patient expects to be discharged to:: Private residence Living Arrangements: Spouse/significant other Available Help at Discharge: Family Type of Home: House Home Access: Level entry     [place]: One level     Bathroom Shower/Tub: Tub/shower unit         Home Equipment: Environmental consultant - 2 wheels;Shower seat          Prior Functioning/Environment Level of Independence: Independent                 OT Problem List: Impaired balance (sitting and/or standing);Decreased knowledge of use of DME or AE;Decreased knowledge of precautions;Pain      OT Treatment/Interventions: Self-care/ADL training;DME and/or AE instruction;Therapeutic activities;Patient/family education;Balance training    OT Goals(Current goals can be found in the care plan section) Acute Rehab OT Goals Patient Stated Goal: to find out why she's falling OT Goal Formulation: With patient Time For Goal Achievement: 06/14/18 Potential to Achieve Goals: Good ADL Goals Pt Will Perform Grooming: with modified independence;standing Pt Will Perform Lower Body Bathing: with modified independence;sit to/from stand Pt Will Perform Lower Body Dressing: with modified independence;sit to/from stand Pt Will Transfer to Toilet: with modified independence;ambulating Pt Will Perform Toileting - Clothing Manipulation and hygiene: with modified independence;sit to/from stand Pt Will Perform Tub/Shower Transfer: Tub transfer;with min guard assist;ambulating;shower seat;rolling walker  OT Frequency: Min 3X/week   Barriers to D/C:            Co-evaluation               AM-PAC PT "6 Clicks" Daily Activity     Outcome Measure Help from another person eating meals?: None Help from another person taking care of personal grooming?: None Help from another person toileting, which includes using toliet, bedpan, or urinal?: A Little Help from another person bathing (including washing, rinsing, drying)?: A Little Help from another person to put on and taking off regular upper body clothing?: None Help from another person to put on and taking off regular lower body clothing?: A Little 6 Click Score: 21   End of Session    Activity Tolerance: Patient tolerated treatment well Patient left: in bed;with call bell/phone within reach;Other (comment)(with CM )  OT Visit Diagnosis: Unsteadiness on feet (R26.81);Pain;Muscle weakness (generalized) (M62.81);Repeated falls (R29.6);History of falling (Z91.81)                Time: 2376-2831 OT Time Calculation (min): 17 min Charges:  OT General Charges $OT Visit: 1 Visit OT Evaluation $OT Eval Low Complexity: 1 Low    Hortencia Pilar 06/07/2018, 2:11 PM

## 2018-06-07 NOTE — Care Management Note (Signed)
Case Management Note  Patient Details  Name: Dawn Kim MRN: [id number] Date of Birth: 03-01-1946  Subjective/Objective: Asked patient about the chosen [place] she will call on her own for [facility] says her spouse is very particular about a lot of people in the house-MD notified.. Patient has [place] agency list-patient voiced understanding of [place] to call if needed or contact her pcp to order.                    Action/Plan:d/c home-pleasantly declined HHC.   Expected Discharge Date:  06/07/18               Expected Discharge Plan:  [place]  In-House Referral:     Discharge planning Services  CM Consult  Post Acute Care Choice:  Durable Medical Equipment(rw) Choice offered to:  Patient  DME Arranged:    DME Agency:     HH Arranged:  Patient Refused [place] Agency:     Status of Service:  Completed, signed off  If discussed at [employer] of Stay Meetings, dates discussed:    Additional Comments:  Dessa Phi, RN 06/07/2018, 4:34 PM

## 2018-06-07 NOTE — Care Management Note (Signed)
Case Management Note  Patient Details  Name: Dawn Kim MRN: [id number] Date of Birth: 1946/10/04  Subjective/Objective: Patient admitted wjfrequent falls. From home w/spouse, has rw, pharmacy, pcp,transp. PT/OT-recc HHC. Provided patient w/HHC agency list-await choice.                   Action/Plan:d/c home w/HHC.   Expected Discharge Date:                  Expected Discharge Plan:  [place]  In-House Referral:     Discharge planning Services  CM Consult  Post Acute Care Choice:  Durable Medical Equipment(rw) Choice offered to:     DME Arranged:    DME Agency:     HH Arranged:    HH Agency:     Status of Service:  In process, will continue to follow  If discussed at Long Length of Stay Meetings, dates discussed:    Additional Comments:  Dessa Phi, RN 06/07/2018, 1:56 PM

## 2018-06-07 NOTE — Evaluation (Signed)
Physical Therapy Evaluation Patient Details Name: Dawn Kim MRN: [id number] DOB: Nov 11, 1945 Today's Date: 06/07/2018   History of Present Illness  72 yo female admitted with frequent falls, R hip pain, AKI. Brief neuro consult 2* possible Parkinsonism.   Clinical Impression  On eval, pt required Min guard-Min assist for mobility (depending on use of RW vs no device). She walked ~50 feet x 2-improved stability with RW use. Did not note any definitive shuffling or gait festination when ambulating. She does have frequent start/stops but it seems to be more because of unsteadiness/fear. Gait was more fluid and step lengths more appropriate with use of a RW. Pt was tearful throughout the session. She was also a bit anxious. She expressed concern about medications that she was taking. Encouraged her to discuss meds/side effect concerns with MD when he rounds. No family was present during the session. Discussed d/c plan-pt plans to return home where she lives with her husband. At time of eval, she was not agreeable to ST rehab or OP PT. She stated she would "think about" HHPT f/u. Will continue to follow and progress activity as tolerated. Will recommend HHPT f/u for general strength and balance training, if pt will agree. Recommend hallway ambulation, using RW, with nursing supervision/assist as able.     Follow Up Recommendations Home health PT;Supervision/Assistance - 24 hour (pt declines SNF placement and OP PT. She stated "I'll think about it" when HHPT suggested)    Equipment Recommendations  None recommended by PT    Recommendations for Other Services       Precautions / Restrictions Precautions Precautions: Fall Precaution Comments: frequent falls for last 2 months per pt Restrictions Weight Bearing Restrictions: No      Mobility  Bed Mobility               General bed mobility comments: pt sitting EOB  Transfers Overall transfer level: Needs assistance Equipment used:  Rolling walker (2 wheeled);None Transfers: Sit to/from Stand Sit to Stand: Min guard         General transfer comment: x 1 with RW, x 1 without device. Increaesed time and effort without device. No physical assistance given but pt was unsteady.   Ambulation/Gait Ambulation/Gait assistance: Min assist Gait Distance (Feet): 50 Feet(x2) Assistive device: Rolling walker (2 wheeled);None Gait Pattern/deviations: Step-to pattern;Decreased stride length     General Gait Details: Walked x 1 without a device-Min assist given due to unsteadiness. Frequent stop/start due to pt's fear of falling/unsteadines. Walked a 2nd time with RW use-Min guard assist. Only 1 brief stop after pt turned around/changed direction. Did not note any shuffling or gait festination.   Stairs            Wheelchair Mobility    Modified Rankin (Stroke Patients Only)       Balance Overall balance assessment: Needs assistance;History of Falls           Standing balance-Leahy Scale: Fair Standing balance comment: had pt perform EO/EC static standing, narrow BOS static standing, 360 degree turns, pick up object. Some difficulty noted with narrow BOS standing. Increased time for turns/picking up object. No overt LOB that required therapist assistance to prevent fall. She is unsteady and there are some balance deficits.                              Pertinent Vitals/Pain Pain Assessment: Faces Faces Pain Scale: Hurts even more Pain Location: R  hip pain Pain Descriptors / Indicators: Sore;Sharp Pain Intervention(s): Limited activity within patient's tolerance;Repositioned    Home Living Family/patient expects to be discharged to:: Private residence Living Arrangements: Spouse/significant other   Type of Home: House Home Access: Level entry     Home Layout: One level Home Equipment: Environmental consultant - 2 wheels      Prior Function Level of Independence: Independent               Hand  Dominance        Extremity/Trunk Assessment   Upper Extremity Assessment Upper Extremity Assessment: Overall WFL for tasks assessed(no tremors noted during session)    Lower Extremity Assessment Lower Extremity Assessment: Generalized weakness    Cervical / Trunk Assessment Cervical / Trunk Assessment: Normal  Communication   Communication: No difficulties  Cognition Arousal/Alertness: Awake/alert Behavior During Therapy: Anxious(tearful) Overall Cognitive Status: No family/caregiver present to determine baseline cognitive functioning                                 General Comments: pt answered all questions. She participated well. Appears WFL aside from above behaviors      General Comments      Exercises     Assessment/Plan    PT Assessment Patient needs continued PT services  PT Problem List Decreased balance;Decreased mobility;Decreased activity tolerance;Decreased strength;Decreased knowledge of use of DME;Pain       PT Treatment Interventions DME instruction;Gait training;Therapeutic activities;Functional mobility training;Balance training;Patient/family education;Therapeutic exercise    PT Goals (Current goals can be found in the Care Plan section)  Acute Rehab PT Goals Patient Stated Goal: to find out why she's falling PT Goal Formulation: With patient Time For Goal Achievement: 06/21/18 Potential to Achieve Goals: Good    Frequency Min 3X/week   Barriers to discharge        Co-evaluation               AM-PAC PT "6 Clicks" Daily Activity  Outcome Measure Difficulty turning over in bed (including adjusting bedclothes, sheets and blankets)?: A Little Difficulty moving from lying on back to sitting on the side of the bed? : A Little Difficulty sitting down on and standing up from a chair with arms (e.g., wheelchair, bedside commode, etc,.)?: A Little Help needed moving to and from a bed to chair (including a wheelchair)?: A  Little Help needed walking in hospital room?: A Little Help needed climbing 3-5 steps with a railing? : A Lot 6 Click Score: 17    End of Session Equipment Utilized During Treatment: Gait belt Activity Tolerance: Patient tolerated treatment well Patient left: in bed;with call bell/phone within reach;with bed alarm set   PT Visit Diagnosis: Unsteadiness on feet (R26.81);History of falling (Z91.81);Repeated falls (R29.6);Difficulty in walking, not elsewhere classified (R26.2)    Time: 7867-6720 PT Time Calculation (min) (ACUTE ONLY): 31 min   Charges:   PT Evaluation $PT Eval Moderate Complexity: 1 Mod PT Treatments $Gait Training: 8-22 mins          Weston Anna, MPT Pager: [phone]

## 2018-06-07 NOTE — Care Management Obs Status (Signed)
[place] NOTIFICATION   Patient Details  Name: Dawn Kim MRN: [id number] Date of Birth: [birth year]   Medicare Observation Status Notification Given:  Yes    MahabirJuliann Pulse, RN 06/07/2018, 2:02 PM

## 2018-06-07 NOTE — Progress Notes (Signed)
PRN anti-anxiety med given earlier but patient has become very agitated, tearful and anxious wanting to leave immediately. "The doctor said I could go". Refuses final run of potassium and takes IV out herself. Discharge instructions gone over with her. Tells Case Manager she will call Home Health agency. Eulas Post, RN

## 2018-06-07 NOTE — Progress Notes (Signed)
Rx Brief note: Lovenox  Wt=84 kg, CrCl ~ 27 ml/min  Rx adjusted Lovenox to 30 mg daily in pt with CrCl<30 ml/min  Thanks Dorrene German 06/07/2018 1:31 AM

## 2018-06-08 ENCOUNTER — Telehealth: Payer: Self-pay

## 2018-06-08 ENCOUNTER — Telehealth: Payer: Self-pay | Admitting: Internal Medicine

## 2018-06-08 LAB — URINE CULTURE: Culture: <10000 — AB

## 2018-06-08 NOTE — Telephone Encounter (Signed)
Copied from [place] [id number]. Topic: Quick Communication - See Telephone Encounter >> Jun 08, 2018 11:07 AM Reyne Dumas L wrote: CRM for notification. See Telephone encounter for: 06/08/18.  Pt wants PCP to know that she just got out of the hospital yesterday.   Pt fell and cut her arm.  Discharge information concerning her medications are much different that what PCP has asked that pt take and she would like for someone to go over this with her.  While pt was in the hospital pt was given IV potassium, so they changed her medications.  Pt was told to discontinue many of her medications. Pt can be reached at [phone]

## 2018-06-08 NOTE — Telephone Encounter (Signed)
LM requesting call back to complete TCM and confirm hospital follow up.   

## 2018-06-08 NOTE — Telephone Encounter (Signed)
Spoke with pt about medication changes. She had concerns about some changes that were made to lunesta, venlafaxine and other meds at the hospital. I made an appointment to discuss these changes and concerns.

## 2018-06-09 NOTE — Telephone Encounter (Signed)
Transition Care Management Follow-up Telephone Call  Per Discharge Summary:  Admit date: 06/06/2018 Discharge date: 06/07/2018  Admitted From: Home  Disposition: Home   Recommendations for Outpatient Follow-up:  1. Follow up with PCP in 1 week  2. Need to taper off Lunesta, it has been decreased to 1 mg, continue slow taper.  3. Adding trazodone 25 mg qHs 4. May need EMG studies if continues to fall   5. Please obtain BMP/CBC in one week function and hemoglobin. 6. Please follow up on the following pending results:  Home Health: PT/OT  Equipment/Devices: Rolling Walker    Discharge Condition: Stable  CODE STATUS: Full Code  Diet recommendation: Heart Healthy    --   How have you been since you were released from the hospital? "I don't feel too good. I'm trying to drink lots of juice and water to stay hydrated and just take it easy."   Do you understand why you were in the hospital? yes   Do you understand the discharge instructions? yes   Where were you discharged to? Home   Items Reviewed:  Medications reviewed: no, patient declined to review meds at time of call and states she will bring AVS with her to appointment on Monday and will review w/ Dr. Quay Burow  Allergies reviewed: no  Dietary changes reviewed: yes  Referrals reviewed: yes   Functional Questionnaire:   Activities of Daily Living (ADLs):   She states they are independent in the following: ambulation, bathing and hygiene, feeding, continence, grooming, toileting and dressing States they require assistance with the following: none   Any transportation issues/concerns?: no   Any patient concerns? no   Confirmed importance and date/time of follow-up visits scheduled yes  Provider Appointment booked with Dr. Billey Gosling 06/12/18 @ 10am  Confirmed with patient if condition begins to worsen call PCP or go to the ER.  Patient was given the office number and encouraged to call back with question or  concerns.  : yes

## 2018-06-10 DIAGNOSIS — G2119 Other drug induced secondary parkinsonism: Secondary | ICD-10-CM | POA: Insufficient documentation

## 2018-06-10 NOTE — Progress Notes (Signed)
Subjective:    Patient ID: Dawn Kim, female    DOB: 1945/10/11, 72 y.o.   MRN: [id number]  HPI The patient is here for follow up from the hospital.  Admitted 06/06/18 - 06/07/18.  Recommendations for Outpatient Follow-up:  1. Follow up with PCP in 1 week  2. Need to taper off Lunesta, it has been decreased to 1 mg, continue slow taper.  3. Adding trazodone 25 mg qHs 4. May need EMG studies if continues to fall   5. Please obtain BMP/CBC in one week function and hemoglobin.  She went to the Ed 9/3 with one month of gait difficulty, frequent falls and not being able to walk.  She stated that her legs would not go where she wanted them to go and she would fall.  She denied dizziness, lightheadedness.  She has fallen multiple times and feels very unsteady.  Her Cr was elevated to 1.8 from 1.0.  UA normal.  She had a shuffling gait and intention tremor.  Neurology was called and suggested likely parkinsonism from the Neurontin.  Ct of head was negative.  She had intermittent minimal delirium in the ED.    She did report back pain that radiated to her leg, some numbness and tingling in the RLE. MRI showed no concerning findings.  PT saw patient.    Frequent falls: Likely related to polypharmacy in setting of AKI with parkinsonism from Neurontin in setting of AKI Neurontin discontinued  MRI of lumbar spine Ct head normal.  No infection Taper off lunesta - decreased to 1 mg x 3 weeks then 0.5 mg x 3 weeks then stop Trazodone 25 mg qhs started effexor decreased to 75 mg BID Norco continued  AKI Pre renal from dehydration Treated with IVF, Cr back to baseline Encouraged oral hydration Check renal function in one week  Hypertension BP stable, initially on low, BP improved with hydration ARB initially helped, but restarted Hctz d/c'd, monitor BP Consider amlodipine if needed  Hypothyroidism Continued on synthroid  GERD Continued on zantac, protonix  Hypokalemia repleted  via IV, recheck BMP  PT recommended home PT   She has not made any of the medication changes, except for stopping the gabapnetin - she has continued the medications she was taken at home.   Parkinsonism, frequent falls, delirium - resolved; She stopped the gabapentin and feels she is doing better Gait is better Stop muscle relaxer since she states it does not help  Insomnia She is still taking lunesta 3 mg nightly - continue for now -- with her next prescribed we will try decreasing down to 1 mg  Depression, anxiety Neither are controlled Decrease effexor to 150 mg daily - she has not made any changes yet -- it is likely not working since she is still very anxious and depressed Continue xanax for now - with her next prescription will change to clonazepam twice daily - I worry if we try to change now she will take both Follow up in one month  Hypertension: Controlled hctz d/c'd  Dehydration, AKI She is drinking more fluids and will continue Cmp, cbc  Medications and allergies reviewed with patient and updated if appropriate.  Patient Active Problem List   Diagnosis Date Noted  . 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 Delirium 06/06/2018  . Frequent falls 06/06/2018  . AKI (acute kidney injury) ([place]) 06/06/2018  . T12 compression fracture ([place]) 03/28/2018  . Midline low back pain without sciatica 03/21/2018  .  Head trauma 03/21/2018  . Decreased appetite 01/06/2018  . Shortness of breath 01/06/2018  . Frequent urination 01/06/2018  . Encounter for pain management 10/05/2017  . Chronic lower back pain 09/19/2017  . 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 Osteopenia 06/21/2016  . Lower leg edema 05/31/2016  . Hypothyroidism 10/22/2015  . Depression 10/22/2015  . Anxiety 10/22/2015  . GERD (gastroesophageal reflux disease) 10/22/2015  . Insomnia 10/22/2015  . Foot pain, right 10/22/2015  . Bilateral knee pain 10/22/2015  . HTN  (hypertension) 01/20/2012  . Lipid disorder 01/20/2012    Current Outpatient Medications on File Prior to Visit  Medication Sig Dispense Refill  . calcium carbonate (OS-CAL - DOSED IN MG OF ELEMENTAL CALCIUM) 1250 (500 Ca) MG tablet Take 1 tablet (500 mg of elemental calcium total) by mouth daily.    . cholecalciferol (VITAMIN D) 1000 UNITS tablet Take 1,000 Units by mouth daily.    . eszopiclone (LUNESTA) 1 MG TABS tablet Take 1 tablet (1 mg total) by mouth at bedtime. Take immediately before bedtime 21 tablet 0  . ferrous sulfate 325 (65 FE) MG EC tablet Take 325 mg by mouth daily with breakfast.    . fish oil-omega-3 fatty acids 1000 MG capsule Take 1 g by mouth 2 (two) times daily.     [name] HYDROcodone-acetaminophen (NORCO) 7.5-325 MG tablet Take 1 tablet by mouth every 8 (eight) hours as needed. -- Office visit needed for further refills (Patient taking differently: Take 1 tablet by mouth every 8 (eight) hours as needed for severe pain. -- Office visit needed for further refills) 90 tablet 0  . levocetirizine (XYZAL) 5 MG tablet Take 1 tablet (5 mg total) by mouth every evening. 30 tablet 0  . levothyroxine (SYNTHROID, LEVOTHROID) 112 MCG tablet TAKE 1 TABLET BY MOUTH ONCE DAILY 90 tablet 1  . methocarbamol (ROBAXIN) 500 MG tablet Take 1 tablet (500 mg total) by mouth 3 (three) times daily. 90 tablet 3  . Multiple Vitamin (MULTIVITAMIN) tablet Take 1 tablet by mouth daily.    [name] omeprazole (PRILOSEC) 40 MG capsule Take 1 capsule (40 mg total) by mouth 2 (two) times daily before a meal. 180 capsule 3  . ramipril (ALTACE) 10 MG capsule Take 1 capsule (10 mg total) by mouth daily. 90 capsule 3  . ranitidine (ZANTAC) 150 MG tablet Take 1 tablet (150 mg total) by mouth at bedtime. 90 tablet 1  . simvastatin (ZOCOR) 40 MG tablet TAKE 1 TABLET BY MOUTH IN THE EVENING 90 tablet 1  . traZODone (DESYREL) 50 MG tablet Take 0.5 tablets (25 mg total) by mouth at bedtime. 30 tablet 0  . venlafaxine XR  (EFFEXOR-XR) 75 MG 24 hr capsule Take 1 capsule (75 mg total) by mouth 2 (two) times daily. 60 capsule 0   No current facility-administered medications on file prior to visit.     Past Medical History:  Diagnosis Date  . Anxiety   . Cataract    Bil  . Depression   . Hypercholesteremia   . Hypertension   . Thyroid disease     Past Surgical History:  Procedure Laterality Date  . CHOLECYSTECTOMY    . COLONOSCOPY    . FOOT SURGERY  2011   right foot  . JOINT REPLACEMENT  2010   left knee  . POLYPECTOMY    . TONSILLECTOMY      Social History   Socioeconomic History  . Marital status: Married    Spouse  name: Not on file  . Number of children: 3  . Years of education: 35  . Highest education level: Not on file  Occupational History  . Occupation: Retired   Scientific laboratory technician  . Financial resource strain: Not very hard  . Food insecurity:    Worry: Never true    Inability: Never true  . Transportation needs:    Medical: No    Non-medical: No  Tobacco Use  . Smoking status: Former Smoker    Types: Cigarettes    Last attempt to quit: 11/04/2009    Years since quitting: 8.6  . Smokeless tobacco: Never Used  Substance and Sexual Activity  . Alcohol use: No  . Drug use: No  . Sexual activity: Never  Lifestyle  . Physical activity:    Days per week: 0 days    Minutes per session: 0 min  . Stress: Rather much  Relationships  . Social connections:    Talks on phone: Not on file    Gets together: Not on file    Attends religious service: Not on file    Active member of club or organization: Not on file    Attends meetings of clubs or organizations: Not on file    Relationship status: Married  Other Topics Concern  . Not on file  Social History Narrative  . Not on file    Family History  Problem Relation Age of Onset  . Heart disease Mother   . Drug abuse Sister     Review of Systems  Constitutional: Negative for chills and fever.  Respiratory: Negative for  cough, shortness of breath and wheezing.   Cardiovascular: Negative for chest pain, palpitations and leg swelling.  Neurological: Negative for light-headedness and headaches.  Psychiatric/Behavioral: Positive for dysphoric mood and sleep disturbance. Negative for suicidal ideas. The patient is nervous/anxious.        Objective:   Vitals:   06/12/18 1010  BP: 122/78  Pulse: 66  Resp: 18  Temp: 98 F (36.7 C)  SpO2: 92%   BP Readings from Last 3 Encounters:  06/12/18 122/78  06/07/18 126/65  05/16/18 120/60   Wt Readings from Last 3 Encounters:  06/12/18 190 lb 1.9 oz (86.2 kg)  06/06/18 186 lb (84.4 kg)  01/06/18 193 lb (87.5 kg)   Body mass index is 34.77 kg/m.   Physical Exam    Constitutional: Appears well-developed and well-nourished. No distress.  HENT:  Head: Normocephalic and atraumatic.  Neck: Neck supple. No tracheal deviation present. No thyromegaly present.  No cervical lymphadenopathy Cardiovascular: Normal rate, regular rhythm and normal heart sounds.   No murmur heard. No carotid bruit .  No edema Pulmonary/Chest: Effort normal and breath sounds normal. No respiratory distress. No has no wheezes. No rales.  Neuro: non focal, no tremor Skin: Skin is warm and dry. Not diaphoretic.  Psychiatric: Normal mood and affect. Behavior is normal.      Assessment & Plan:    See Problem List for Assessment and Plan of chronic medical problems.

## 2018-06-10 NOTE — Assessment & Plan Note (Addendum)
hctz d/c'd in the hospital due to AKI from dehydration Ramipril continued BP well controlled Continue current medications at current doses cmp

## 2018-06-12 ENCOUNTER — Encounter: Payer: Self-pay | Admitting: Internal Medicine

## 2018-06-12 ENCOUNTER — Other Ambulatory Visit (INDEPENDENT_AMBULATORY_CARE_PROVIDER_SITE_OTHER): Payer: Medicare Other

## 2018-06-12 ENCOUNTER — Other Ambulatory Visit: Payer: Self-pay | Admitting: Internal Medicine

## 2018-06-12 ENCOUNTER — Ambulatory Visit: Payer: Medicare Other | Admitting: Internal Medicine

## 2018-06-12 VITALS — BP 122/78 | HR 66 | Temp 98.0°F | Resp 18 | Ht 62.0 in | Wt 190.1 lb

## 2018-06-12 DIAGNOSIS — I10 Essential (primary) hypertension: Secondary | ICD-10-CM

## 2018-06-12 DIAGNOSIS — R410 Disorientation, unspecified: Secondary | ICD-10-CM | POA: Diagnosis not present

## 2018-06-12 DIAGNOSIS — N179 Acute kidney failure, unspecified: Secondary | ICD-10-CM | POA: Diagnosis not present

## 2018-06-12 DIAGNOSIS — G2119 Other drug induced secondary parkinsonism: Secondary | ICD-10-CM | POA: Diagnosis not present

## 2018-06-12 DIAGNOSIS — R296 Repeated falls: Secondary | ICD-10-CM | POA: Diagnosis not present

## 2018-06-12 DIAGNOSIS — F419 Anxiety disorder, unspecified: Secondary | ICD-10-CM

## 2018-06-12 DIAGNOSIS — G4700 Insomnia, unspecified: Secondary | ICD-10-CM

## 2018-06-12 DIAGNOSIS — Z23 Encounter for immunization: Secondary | ICD-10-CM | POA: Diagnosis not present

## 2018-06-12 LAB — COMPREHENSIVE METABOLIC PANEL
ALBUMIN: 4.1 g/dL (ref 3.5–5.2)
ALK PHOS: 49 U/L (ref 39–117)
ALT: 16 U/L (ref 0–35)
AST: 15 U/L (ref 0–37)
BUN: 14 mg/dL (ref 6–23)
CO2: 28 mEq/L (ref 19–32)
Calcium: 9.2 mg/dL (ref 8.4–10.5)
Chloride: 103 mEq/L (ref 96–112)
Creatinine, Ser: 0.97 mg/dL (ref 0.40–1.20)
GFR: 59.66 mL/min — ABNORMAL LOW (ref >60.00)
Glucose, Bld: 91 mg/dL (ref 70–99)
POTASSIUM: 4.1 meq/L (ref 3.5–5.1)
Sodium: 139 mEq/L (ref 135–145)
TOTAL PROTEIN: 6.7 g/dL (ref 6.0–8.3)
Total Bilirubin: 0.7 mg/dL (ref 0.2–1.2)

## 2018-06-12 LAB — CBC WITH DIFFERENTIAL/PLATELET
Basophils Absolute: 0 10*3/uL (ref 0.0–0.1)
Basophils Relative: 0.3 % (ref 0.0–3.0)
EOS PCT: 1.8 % (ref 0.0–5.0)
Eosinophils Absolute: 0.2 10*3/uL (ref 0.0–0.7)
HCT: 39.9 % (ref 36.0–46.0)
HEMOGLOBIN: 13.6 g/dL (ref 12.0–15.0)
LYMPHS PCT: 26.5 % (ref 12.0–46.0)
Lymphs Abs: 2.2 10*3/uL (ref 0.7–4.0)
MCHC: 34 g/dL (ref 30.0–36.0)
MCV: 91.5 fl (ref 78.0–100.0)
MONOS PCT: 7.1 % (ref 3.0–12.0)
Monocytes Absolute: 0.6 10*3/uL (ref 0.1–1.0)
Neutro Abs: 5.3 10*3/uL (ref 1.4–7.7)
Neutrophils Relative %: 64.3 % (ref 43.0–77.0)
Platelets: 302 10*3/uL (ref 150.0–400.0)
RBC: 4.35 Mil/uL (ref 3.87–5.11)
RDW: 12.9 % (ref 11.5–15.5)
WBC: 8.3 10*3/uL (ref 4.0–10.5)

## 2018-06-12 MED ORDER — HYDROCODONE-ACETAMINOPHEN 7.5-325 MG PO TABS: 1.0000 | ORAL_TABLET | Freq: Three times a day (TID) | ORAL | 0 refills | Status: DC | PRN

## 2018-06-12 MED ORDER — ALPRAZOLAM 0.5 MG PO TBDP: 0.5000 mg | ORAL_TABLET | Freq: Three times a day (TID) | ORAL | 0 refills | Status: DC | PRN

## 2018-06-12 MED ORDER — CLONAZEPAM 0.5 MG PO TABS: 0.5000 mg | ORAL_TABLET | Freq: Two times a day (BID) | ORAL | 1 refills | Status: DC | PRN

## 2018-06-12 MED ORDER — VENLAFAXINE HCL ER 150 MG PO CP24: 150.0000 mg | ORAL_CAPSULE | Freq: Every day | ORAL | 0 refills | Status: DC

## 2018-06-12 NOTE — Patient Instructions (Addendum)
  Test(s) ordered today. Your results will be released to [place] (or called to you) after review, usually within 72hours after test completion. If any changes need to be made, you will be notified at that same time.  Flu immunization administered today.    Medications reviewed and updated.  Changes include:  Decreasing Effexor to 150 mg once a day Stop the Neurontin and throw away the medication Stop the methocarbamol and throw it away    Please followup in one month

## 2018-06-12 NOTE — Assessment & Plan Note (Signed)
Very anxious Not controlled with effexor and xanax Will dec effexor to 150- mg daily - will taper off and try a different medicatioin Will change xanax to clonazepam when she needs her next refill FU in one month

## 2018-06-12 NOTE — Assessment & Plan Note (Signed)
Has been on several medications in the past Trazodone is not effective Will decrease lunesta to 1 mg with her next refill

## 2018-06-12 NOTE — Assessment & Plan Note (Signed)
Related to parkinsonism from gabapentin Will decreased medications as much as possible over the next few weeks

## 2018-06-12 NOTE — Telephone Encounter (Signed)
Last refill was hydrocodone was 05/11/18.  Was seen today.  Please advise.

## 2018-06-12 NOTE — Assessment & Plan Note (Signed)
Resolved with IVF - she is drinking more cmp today

## 2018-06-12 NOTE — Assessment & Plan Note (Signed)
Improved after gabapentin d/c'd - placed on allergy list Has polypharmacy - will work to further decrease certain medications

## 2018-06-12 NOTE — Telephone Encounter (Signed)
Pt called to f/u on refill for hydrocodone (3 days left) and levocetirizine (1 wk left). Pt states she does not need ranitidine as she has loads of it.   [place], [place] [phone] (Phone) [phone] (Fax)

## 2018-06-12 NOTE — Assessment & Plan Note (Signed)
?   Cause resolved

## 2018-06-13 ENCOUNTER — Telehealth: Payer: Self-pay | Admitting: Internal Medicine

## 2018-06-13 ENCOUNTER — Other Ambulatory Visit: Payer: Self-pay | Admitting: Internal Medicine

## 2018-06-13 MED ORDER — LEVOCETIRIZINE DIHYDROCHLORIDE 5 MG PO TABS: 5.0000 mg | ORAL_TABLET | Freq: Every evening | ORAL | 0 refills | Status: DC

## 2018-06-13 MED ORDER — CLONAZEPAM 0.5 MG PO TABS: 0.5000 mg | ORAL_TABLET | Freq: Two times a day (BID) | ORAL | 1 refills | Status: DC | PRN

## 2018-06-13 NOTE — Telephone Encounter (Signed)
Copied from [place] [phone]. Topic: General - Other >> Jun 13, 2018  4:28 PM Janace Aris A wrote: Reason for CRM: Patient's insurance needs prior authorization to release medication to Patient. Patient is currently out of medication and would like this to be done as quickly as it can be.   Please advise.

## 2018-06-13 NOTE — Telephone Encounter (Signed)
Copied from [place] [phone]. Topic: Quick Communication - See Telephone Encounter >> Jun 13, 2018  9:20 AM Percell Belt A wrote: CRM for notification. See Telephone encounter for: 06/13/18.  Pt called in and stated that [facility] did not have the clonazePAM (KLONOPIN) 0.5 MG tablet [[id number]] .  Can this be resent to them?  They do not have the hydrocodone in, they are going to have to order it.  They also did not receive the levocetirizine (XYZAL) 5 MG tablet [[id number]]   [facility] [address]

## 2018-06-13 NOTE — Telephone Encounter (Signed)
Xyzal has been resent. Pended clonazepam. It was on no print when sent.

## 2018-06-13 NOTE — Telephone Encounter (Signed)
Guessing the medication you already have a TE on .[name] Clonazepam?

## 2018-06-13 NOTE — Telephone Encounter (Signed)
Patient called in saying the Pharmacy is requiring Prior Authorization for the medication Clonazepam. Please Advise.

## 2018-06-14 ENCOUNTER — Other Ambulatory Visit: Payer: Self-pay | Admitting: Internal Medicine

## 2018-06-14 NOTE — Telephone Encounter (Signed)
Pt called in and the pre authorization for the clonazePAM (KLONOPIN) 0.5 MG tablet  Is needed

## 2018-06-14 NOTE — Telephone Encounter (Signed)
PA has been initiated on clonazepam.   KEY: [id number]   Waiting on response.

## 2018-06-15 ENCOUNTER — Telehealth: Payer: Self-pay | Admitting: Internal Medicine

## 2018-06-15 MED ORDER — LORAZEPAM 0.5 MG PO TABS: 0.5000 mg | ORAL_TABLET | Freq: Two times a day (BID) | ORAL | 0 refills | Status: DC | PRN

## 2018-06-15 NOTE — Telephone Encounter (Signed)
We will try Ativan-sent to pharmacy.  This looks like it will be covered.  Just let patient know

## 2018-06-15 NOTE — Telephone Encounter (Signed)
Copied from [place] [id number]. Topic: General - Other >> Jun 13, 2018  4:28 PM Janace Aris A wrote: Reason for CRM: Patient's insurance needs prior authorization to release medication to Patient. Patient is currently out of medication and would like this to be done as quickly as it can be.   Please advise. >> Jun 15, 2018  9:02 AM Yvette Rack wrote: Venida Jarvis from [facility] calling for clinical information so that they can approve the medicine clonazePAM (KLONOPIN) 0.5 MG tablet  By the end of the day there number [phone] option 5  Fax number [phone]

## 2018-06-15 NOTE — Telephone Encounter (Signed)
Pt is aware of new rx. Advised her to call pharmacy to make sure it is covered before going to pick it up. Pt understood.

## 2018-06-15 NOTE — Addendum Note (Signed)
Addended by: Binnie Rail on: 06/15/2018 01:39 PM   Modules accepted: Orders

## 2018-06-15 NOTE — Telephone Encounter (Signed)
Called number below to do PA over the phone. They stated that the PA was denied. Medication is not covered for anxiety. Is there any other alternatives for patient?

## 2018-06-15 NOTE — Telephone Encounter (Signed)
Pt called to say that the ativan needs prior autho as well but she will pay out of pocket for this one

## 2018-06-19 ENCOUNTER — Telehealth: Payer: Self-pay | Admitting: Internal Medicine

## 2018-06-19 MED ORDER — ESZOPICLONE 2 MG PO TABS: 2.0000 mg | ORAL_TABLET | Freq: Every evening | ORAL | 0 refills | Status: DC | PRN

## 2018-06-19 NOTE — Telephone Encounter (Signed)
Please advise if you are ok with filling alprazolam bc lorazepam does not work. Pt just got lorazepam filled on 06/15/18. Last refill of alprazolam was 05/21/18. Last refill of lunesta was 05/20/18.

## 2018-06-19 NOTE — Telephone Encounter (Signed)
Copied from [place] [id number]. Topic: Quick Communication - Rx Refill/Question >> Jun 19, 2018  9:31 AM Nils Flack wrote: Medication: LORazepam (ATIVAN) 0.5 MG tablet  Has the patient contacted their pharmacy? Yes.   (Agent: If no, request that the patient contact the pharmacy for the refill.) (Agent: If yes, when and what did the pharmacy advise?)  Preferred Pharmacy (with phone number or street name): [facility] [address]   - Pt needs prior auth for this med   Agent: Please be advised that RX refills may take up to 3 business days. We ask that you follow-up with your pharmacy.

## 2018-06-19 NOTE — Telephone Encounter (Signed)
Pt received refill. States this medication does not work for her. Another message in regards has been sent to PCP.

## 2018-06-19 NOTE — Telephone Encounter (Signed)
Copied from [place] [id number]. Topic: Quick Communication - Rx Refill/Question >> Jun 19, 2018  9:31 AM Nils Flack wrote: Medication: LORazepam (ATIVAN) 0.5 MG tablet  Has the patient contacted their pharmacy? Yes.   (Agent: If no, request that the patient contact the pharmacy for the refill.) (Agent: If yes, when and what did the pharmacy advise?)  Preferred Pharmacy (with phone number or street name): [facility] [address]   - Pt needs prior auth for this med   Agent: Please be advised that RX refills may take up to 3 business days. We ask that you follow-up with your pharmacy.

## 2018-06-19 NOTE — Telephone Encounter (Signed)
Copied from [place] [id number]. Topic: Quick Communication - Rx Refill/Question >> Jun 19, 2018 10:04 AM Alfredia Ferguson R wrote: Medication:eszopiclone (LUNESTA) 3 MG TABS tablet  Has the patient contacted their pharmacy? Yes  Preferred Pharmacy (with phone number or street name): [place], [place] [phone] (Phone) [phone] (Fax)    Agent: Please be advised that RX refills may take up to 3 business days. We ask that you follow-up with your pharmacy.

## 2018-06-19 NOTE — Telephone Encounter (Signed)
Patient calling and states that she is checking on the alprazolam and the lunesta refills. States that she is worried that these will not be filled. States she is out of medication. Please advise.

## 2018-06-19 NOTE — Telephone Encounter (Signed)
She was just in the hospital because of all the medications she is ok and side effects they are causing - she needs to stay on the lorazepam  The lunesta we will keep at 2 mg nightly - lower than what she was on previously, but higher than what they gave her in the hospital.  lunesta sent to [place].

## 2018-06-19 NOTE — Telephone Encounter (Signed)
Pt requesting Lunesta 3 mg instead of 1 mg LR: of 1 mg tabs was written when pt was in hospital per pt. Pt stated that it is due to be filled because she last filled it 05/20/18.  LOV: 06/12/18 PCP: Dr Quay Burow  Ph[facility]: [facility] [address]   Pt requesting alprazolam 0.5 mg not on active profile. Pt stated the lorazepam doesn't work.  Alprazolam 0.5 mg dissolvable tablet Start date 06/12/18 end date 06/12/18  Pt very specific about what doses she wants.

## 2018-06-19 NOTE — Telephone Encounter (Signed)
Copied from [place] [phone]. Topic: Quick Communication - Rx Refill/Question >> Jun 19, 2018  9:30 AM Nils Flack wrote: Medication: eszopiclone (LUNESTA) 3 MG TABS tablet  Has the patient contacted their pharmacy? No. (Agent: If no, request that the patient contact the pharmacy for the refill.) (Agent: If yes, when and what did the pharmacy advise?)  Preferred Pharmacy (with phone number or street name): [facility] [address]  Pt wants to change dose to 3 mg.   Agent: Please be advised that RX refills may take up to 3 business days. We ask that you follow-up with your pharmacy.

## 2018-06-20 ENCOUNTER — Other Ambulatory Visit: Payer: Self-pay | Admitting: Internal Medicine

## 2018-06-20 NOTE — Telephone Encounter (Signed)
I want her to try this dose longer of the lorazepam.  She can consult with a psychiatrist if she wishes.  Because she was just hospitalized from side effects from medication we need to make some changes.

## 2018-06-20 NOTE — Telephone Encounter (Signed)
Pt states that she would like to see if the lorazepam can be increased to see if it will help with her anxiety. She states what she is currently on does not help her at all. She is aware about the lunesta.

## 2018-06-21 NOTE — Telephone Encounter (Signed)
ok 

## 2018-06-21 NOTE — Telephone Encounter (Signed)
Gave pt response below and she understood and would like you to continue managing her medications at this time. She states she can discuss further issues with medications at her next appointment if needed. For now she will try the medications as you have advised.

## 2018-06-26 ENCOUNTER — Ambulatory Visit: Payer: Self-pay | Admitting: *Deleted

## 2018-06-26 MED ORDER — ALPRAZOLAM 0.5 MG PO TABS: 0.5000 mg | ORAL_TABLET | Freq: Three times a day (TID) | ORAL | 0 refills | Status: DC

## 2018-06-26 NOTE — Telephone Encounter (Signed)
Pt reports mild SOB, increased weakness, fatigue and anxiety since starting lorazepam 06/15/18. States SOB is mild, speech clear, non- halting during call.  Pt is requesting to be placed back on Xanax. States anxiety has worsened; also reports not sleeping well since Lunesta decreased and requesting increased dosage.  Please advise: [phone]   or   [phone]  Answer Assessment - Initial Assessment Questions 1. SYMPTOMS: "Do you have any symptoms?"     Yes, SOB, weakness, fatigued, poor endurance, anxiety 2. SEVERITY: If symptoms are present, ask "Are they mild, moderate or severe?"    Mild SOB, other symptoms moderate  Protocols used: MEDICATION QUESTION CALL-A-AH

## 2018-06-26 NOTE — Telephone Encounter (Signed)
LVM for pt to call back.

## 2018-06-26 NOTE — Telephone Encounter (Signed)
I will put her back on xanax - she needs to dispense of the lorazepam in coffee ground and should no longer take it.   I will not increase the lunesta due to the amount of medications she is on.    She still has the option of seeing a psychiatrist and I do recommend this for treatment of her anxiety.

## 2018-06-27 NOTE — Telephone Encounter (Signed)
Pt aware of response below.  

## 2018-07-11 ENCOUNTER — Other Ambulatory Visit: Payer: Self-pay

## 2018-07-11 ENCOUNTER — Emergency Department (HOSPITAL_COMMUNITY): Payer: Medicare Other

## 2018-07-11 ENCOUNTER — Emergency Department (HOSPITAL_COMMUNITY)
Admission: EM | Admit: 2018-07-11 | Discharge: 2018-07-11 | Disposition: A | Discharge status: Home / Self Care | Payer: Medicare Other | Attending: Emergency Medicine | Admitting: Emergency Medicine

## 2018-07-11 ENCOUNTER — Encounter (HOSPITAL_COMMUNITY): Payer: Self-pay

## 2018-07-11 DIAGNOSIS — Z96652 Presence of left artificial knee joint: Secondary | ICD-10-CM | POA: Diagnosis not present

## 2018-07-11 DIAGNOSIS — F0390 Unspecified dementia without behavioral disturbance: Secondary | ICD-10-CM | POA: Insufficient documentation

## 2018-07-11 DIAGNOSIS — Y929 Unspecified place or not applicable: Secondary | ICD-10-CM | POA: Diagnosis not present

## 2018-07-11 DIAGNOSIS — S4992XA Unspecified injury of left shoulder and upper arm, initial encounter: Secondary | ICD-10-CM | POA: Diagnosis not present

## 2018-07-11 DIAGNOSIS — Z79899 Other long term (current) drug therapy: Secondary | ICD-10-CM | POA: Insufficient documentation

## 2018-07-11 DIAGNOSIS — Y9389 Activity, other specified: Secondary | ICD-10-CM | POA: Diagnosis not present

## 2018-07-11 DIAGNOSIS — M25512 Pain in left shoulder: Secondary | ICD-10-CM | POA: Diagnosis not present

## 2018-07-11 DIAGNOSIS — I10 Essential (primary) hypertension: Secondary | ICD-10-CM | POA: Insufficient documentation

## 2018-07-11 DIAGNOSIS — W19XXXA Unspecified fall, initial encounter: Secondary | ICD-10-CM | POA: Diagnosis not present

## 2018-07-11 DIAGNOSIS — Z87891 Personal history of nicotine dependence: Secondary | ICD-10-CM | POA: Diagnosis not present

## 2018-07-11 DIAGNOSIS — E039 Hypothyroidism, unspecified: Secondary | ICD-10-CM | POA: Diagnosis not present

## 2018-07-11 DIAGNOSIS — S0990XA Unspecified injury of head, initial encounter: Secondary | ICD-10-CM | POA: Diagnosis present

## 2018-07-11 DIAGNOSIS — S3992XA Unspecified injury of lower back, initial encounter: Secondary | ICD-10-CM | POA: Diagnosis not present

## 2018-07-11 DIAGNOSIS — S0101XA Laceration without foreign body of scalp, initial encounter: Secondary | ICD-10-CM | POA: Insufficient documentation

## 2018-07-11 DIAGNOSIS — M545 Low back pain: Secondary | ICD-10-CM | POA: Diagnosis not present

## 2018-07-11 DIAGNOSIS — R002 Palpitations: Secondary | ICD-10-CM | POA: Diagnosis not present

## 2018-07-11 DIAGNOSIS — S0003XA Contusion of scalp, initial encounter: Secondary | ICD-10-CM | POA: Diagnosis not present

## 2018-07-11 DIAGNOSIS — Y998 Other external cause status: Secondary | ICD-10-CM | POA: Insufficient documentation

## 2018-07-11 NOTE — Discharge Instructions (Addendum)
Please follow up with your regular doctor in 10 days to remove the staple in your head. We did not find any broken bones in your back, shoulders, or head. If you experience worsening pain, confusion, falls, seizures, syncope, trouble speaking or walking please see a doctor immediately.

## 2018-07-11 NOTE — ED Triage Notes (Addendum)
Pt stood up in Cath lab waiting area and fell. Pt has a mid lower posterior Head lac. w/R. Temporal bruising

## 2018-07-11 NOTE — ED Notes (Signed)
Pt disoriented to general conversation.

## 2018-07-11 NOTE — ED Provider Notes (Signed)
[facility] EMERGENCY DEPARTMENT Provider Note   CSN: [id number] Arrival date & time: 07/11/18  1312     History   Chief Complaint Chief Complaint  Patient presents with  . Fall    HPI Dawn Kim is a 72 y.o. female with dementia and history of frequent falls who presents after a fall from standing. She was sitting in the cath lab waiting room, stood up, tried to move her legs but couldn't, and then she fell. She denies prodromal dizziness, lightheadedness, or odd smells. Does endorse palpitations before the fall. She is unsure about LOC and cannot tell me which direction she fell. She denies urinary or bowel incontinence, tongue biting. She is alert and oriented x 4 and complaining of headache and severe lower back pain worse in bilateral paraspinal regions.   Per chart review, she has a history of frequent falls and was recently admitted at [facility] during which her falls were thought to be due to polypharmacy and parkinsonism side effect from gabapentin. She has since stopped gabapentin. But is still taking Lunesta, xanax, and norco.  ROS negative for chest pain, sob, dizziness, abdominal pain  HPI  Past Medical History:  Diagnosis Date  . Anxiety   . Cataract    Bil  . Depression   . Hypercholesteremia   . Hypertension   . Thyroid disease     Patient Active Problem List   Diagnosis Date Noted  . 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 Delirium 06/06/2018  . Frequent falls 06/06/2018  . AKI (acute kidney injury) ([place]) 06/06/2018  . T12 compression fracture ([place]) 03/28/2018  . Midline low back pain without sciatica 03/21/2018  . Head trauma 03/21/2018  . Decreased appetite 01/06/2018  . Shortness of breath 01/06/2018  . Frequent urination 01/06/2018  . Encounter for pain management 10/05/2017  . Chronic lower back pain 09/19/2017  . 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 Osteopenia 06/21/2016  . Lower leg edema  05/31/2016  . Hypothyroidism 10/22/2015  . Depression 10/22/2015  . Anxiety 10/22/2015  . GERD (gastroesophageal reflux disease) 10/22/2015  . Insomnia 10/22/2015  . Foot pain, right 10/22/2015  . Bilateral knee pain 10/22/2015  . HTN (hypertension) 01/20/2012  . Lipid disorder 01/20/2012    Past Surgical History:  Procedure Laterality Date  . CHOLECYSTECTOMY    . COLONOSCOPY    . FOOT SURGERY  2011   right foot  . JOINT REPLACEMENT  2010   left knee  . POLYPECTOMY    . TONSILLECTOMY       OB History   None      Home Medications    Prior to Admission medications   Medication Sig Start Date End Date Taking? Authorizing Provider  ALPRAZolam Duanne Moron) 0.5 MG tablet Take 1 tablet (0.5 mg total) by mouth 3 (three) times daily. 06/26/18   Binnie Rail, MD  calcium carbonate (OS-CAL - DOSED IN MG OF ELEMENTAL CALCIUM) 1250 (500 Ca) MG tablet Take 1 tablet (500 mg of elemental calcium total) by mouth daily. 05/31/16   Nche, Charlene Brooke, NP  cholecalciferol (VITAMIN D) 1000 UNITS tablet Take 1,000 Units by mouth daily.    [provider]  eszopiclone (LUNESTA) 2 MG TABS tablet Take 1 tablet (2 mg total) by mouth at bedtime as needed for sleep. Take immediately before bedtime 06/19/18   Binnie Rail, MD  ferrous sulfate 325 (65 FE) MG EC tablet Take 325 mg by  mouth daily with breakfast.    [provider]  fish oil-omega-3 fatty acids 1000 MG capsule Take 1 g by mouth 2 (two) times daily.     [provider]  HYDROcodone-acetaminophen (NORCO) 7.5-325 MG tablet Take 1 tablet by mouth every 8 (eight) hours as needed (chronic knee pain). -- Office visit needed for further refills 06/12/18   Binnie Rail, MD  levocetirizine (XYZAL) 5 MG tablet Take 1 tablet (5 mg total) by mouth every evening. 06/13/18   Binnie Rail, MD  levothyroxine (SYNTHROID, LEVOTHROID) 112 MCG tablet TAKE 1 TABLET BY MOUTH ONCE DAILY 05/17/18   Binnie Rail, MD  Multiple Vitamin  (MULTIVITAMIN) tablet Take 1 tablet by mouth daily.    [provider]  omeprazole (PRILOSEC) 40 MG capsule Take 1 capsule (40 mg total) by mouth 2 (two) times daily before a meal. 03/24/18   Burns, Claudina Lick, MD  ramipril (ALTACE) 10 MG capsule Take 1 capsule (10 mg total) by mouth daily. 06/08/18   Patrecia Pour, Christean Grief, MD  ranitidine (ZANTAC) 150 MG tablet TAKE 1 TABLET BY MOUTH AT BEDTIME 06/14/18   Binnie Rail, MD  simvastatin (ZOCOR) 40 MG tablet TAKE 1 TABLET BY MOUTH IN THE EVENING 05/22/18   Binnie Rail, MD  venlafaxine XR (EFFEXOR XR) 150 MG 24 hr capsule Take 1 capsule (150 mg total) by mouth daily with breakfast. 06/12/18   Binnie Rail, MD    Family History Family History  Problem Relation Age of Onset  . Heart disease Mother   . Drug abuse Sister     Social History Social History   Tobacco Use  . Smoking status: Former Smoker    Types: Cigarettes    Last attempt to quit: 11/04/2009    Years since quitting: 8.6  . Smokeless tobacco: Never Used  Substance Use Topics  . Alcohol use: No  . Drug use: No     Allergies   Gabapentin; Penicillins; and Amoxicillin   Review of Systems Review of Systems See HPI  Physical Exam Updated Vital Signs BP (!) 159/74   Pulse 63   Resp 20   Ht 5\' 5"  (1.651 m)   Wt 90.7 kg   SpO2 98%   BMI 33.28 kg/m   Physical Exam Vitals:   07/11/18 1328 07/11/18 1330 07/11/18 1345 07/11/18 1400  BP:  (!) 147/80 (!) 149/76 (!) 159/74  Pulse:  69 65 63  Resp:  14 18 20   SpO2:  99% 100% 98%  Weight: 90.7 kg     Height: 5\' 5"  (1.651 m)      General: Vital signs reviewed.  Patient is well-developed and well-nourished, in no acute distress. Patient intermittently slow to respond but cooperative with exam.  Head: Normocephalic. 1cm abrasion on posterior scalp with minimal dried blood and surrounding erythema. 3cm area of ecchymosis on right temporal area.  Eyes: EOM sluggish but intact, conjunctivae normal, no scleral icterus.  PERRL Neck: Supple, trachea midline, normal ROM, no JVD, masses, thyromegaly, or carotid bruit present. No c-spine tenderness. + tenderness over bilateral clavicles without crepitus or joint laxity.  Cardiovascular: RRR, S1 normal, S2 normal, no murmurs, gallops, or rubs. Pulmonary/Chest: Clear to auscultation bilaterally, no wheezes, rales, or rhonchi. Abdominal: Soft, non-tender, non-distended, BS +, no masses, organomegaly, or guarding present.  Musculoskeletal: TTP over lower back paraspinal regions bilaterally. Pain with flexion of low back. No tenderness to palpation of midline lumbar spine. No skin abrasions or ecchymosis of low  back.  Extremities: No lower extremity edema bilaterally,  pulses symmetric and intact bilaterally.  Neurological: A&O x3, Strength is 4+ and symmetric bilaterally, cranial nerve II-XII are grossly intact, no focal motor deficit, sensory intact to light touch bilaterally. Lower extremity reflexes symmetric bilaterally. No clonus. Unable to assess gait.  Psychiatric: Normal mood and affect. Waxing and waning level of consciousness but easily aroussable and responds appropriately to questioning. Cognition and memory are normal.    ED Treatments / Results  Labs (all labs ordered are listed, but only abnormal results are displayed) Labs Reviewed - No data to display  EKG EKG Interpretation  Date/Time:  Tuesday July 11 2018 13:30:15 EDT Ventricular Rate:  69 PR Interval:    QRS Duration: 105 QT Interval:  416 QTC Calculation: 446 R Axis:   49 Text Interpretation:  Sinus rhythm RSR' in V1 or V2, right VCD or RVH Borderline T abnormalities, anterior leads No significant change was found Confirmed by Jola Schmidt [phone]) on 07/11/2018 3:08:02 PM   Radiology Dg Lumbar Spine Complete  Result Date: 07/11/2018 CLINICAL DATA:  Acute low back pain following fall today. Initial encounter. EXAM: LUMBAR SPINE - COMPLETE 4+ VIEW COMPARISON:  06/07/2018 MR, 03/27/2018  radiographs and prior studies FINDINGS: No acute fracture or subluxation identified. T12 SUPERIOR endplate compression fracture is unchanged from 06/07/2018 MR. Mild to moderate multilevel degenerative disc disease, spondylosis and facet arthropathy again noted. No focal bony lesions or spondylolysis identified. IMPRESSION: 1. No acute abnormality 2. Multilevel degenerative changes and T12 endplate compression fracture again noted. Electronically Signed   By: Margarette Canada M.D.   On: 07/11/2018 14:30   Dg Clavicle Left  Result Date: 07/11/2018 CLINICAL DATA:  Acute LEFT clavicle pain following fall today. Initial encounter. EXAM: LEFT CLAVICLE - 2+ VIEWS COMPARISON:  None. FINDINGS: There is no evidence of fracture or other focal bone lesions. Soft tissues are unremarkable. IMPRESSION: Negative. Electronically Signed   By: Margarette Canada M.D.   On: 07/11/2018 14:31   Dg Clavicle Right  Result Date: 07/11/2018 CLINICAL DATA:  Acute RIGHT clavicle pain following fall today. Initial encounter. EXAM: RIGHT CLAVICLE - 2+ VIEWS COMPARISON:  None. FINDINGS: No acute fracture or dislocation. No focal bony lesions present. Mild degenerative changes at the [facility] joint are noted. IMPRESSION: No acute abnormality. Electronically Signed   By: Margarette Canada M.D.   On: 07/11/2018 14:32   Ct Head Wo Contrast  Result Date: 07/11/2018 CLINICAL DATA:  Head trauma with ataxia. Patient fell when standing up in cath lab waiting area with posterior head laceration and temporal bruising. EXAM: CT HEAD WITHOUT CONTRAST TECHNIQUE: Contiguous axial images were obtained from the base of the skull through the vertex without intravenous contrast. COMPARISON:  06/06/2018 FINDINGS: Brain: No evidence of acute infarction, hemorrhage, hydrocephalus, extra-axial collection or mass lesion/mass effect. Vascular: Moderate atherosclerosis of the carotid siphons. No hyperdense vessel sign or unexpected calcifications. Skull: Normal. Negative for  fracture or focal lesion. Sinuses/Orbits: Small osteoma in the left ethmoid sinus, stable in appearance. No significant mucosal thickening. Clear mastoids. Bilateral lens replacements. Other: Mild right parietal scalp contusion and soft tissue swelling. IMPRESSION: Right parietal scalp contusion. No acute intracranial abnormality or fracture. Electronically Signed   By: Ashley Royalty M.D.   On: 07/11/2018 14:57    Procedures .Marland KitchenLaceration Repair Date/Time: 07/11/2018 3:22 PM Performed by: Isabelle Course, MD Authorized by: Jola Schmidt, MD   Consent:    Consent obtained:  Verbal   Consent given by:  Patient   Risks discussed:  Infection, need for additional repair and pain Anesthesia (see MAR for exact dosages):    Anesthesia method:  None Laceration details:    Location:  Scalp   Scalp location:  Occipital   Length (cm):  0.5   Depth (mm):  1 Repair type:    Repair type:  Simple Exploration:    Hemostasis achieved with:  Direct pressure Treatment:    Area cleansed with:  Saline   Amount of cleaning:  Standard Skin repair:    Repair method:  Staples   Number of staples:  1 Approximation:    Approximation:  Loose Post-procedure details:    Dressing:  Open (no dressing)   Patient tolerance of procedure:  Tolerated well, no immediate complications   (including critical care time)  Medications Ordered in ED Medications - No data to display   Initial Impression / Assessment and Plan / ED Course  I have reviewed the triage vital signs and the nursing notes.  Pertinent labs & imaging results that were available during my care of the patient were reviewed by me and considered in my medical decision making (see chart for details).     72yo female with dementia and frequent falls presents after fall from standing with abrasion to posterior scalp, right temporal ecchymosis, headache, low back pain, and bilateral clavicle pain. Slightly hypertensive and otherwise normal vital signs.  Neuro exam is unremarkable. Abrasion on posterior scalp with small amount of bloody oozing and was closed with one staple. Previous work up for falls concluded the etiology as polypharmacy on top of dementia. She is still taking lunesta, xanax, and norco. Cannot remember if she's had these medicines today. Doubt seizure given lack of urinary/bowel incontinence or tongue biting. No prodromal symptoms to indicate syncope. EKG without ischemic changes, denies chest pain. Unlikely ACS. No chest pain, tachycardia or hypoxia to suggest PE.  X-rays of lumbar spine and bilateral clavicles were without evidence of injury Head CT was unremarkable.   Patient was able to ambulate with assistance and is safe to discharge. Instructed to follow up with PCP in 10 days for staple removal.   Final Clinical Impressions(s) / ED Diagnoses   Final diagnoses:  Fall, initial encounter    ED Discharge Orders    None       Isabelle Course, MD 07/11/18 1527    Jola Schmidt, MD 07/11/18 [phone]

## 2018-07-11 NOTE — ED Notes (Signed)
Pt returned from imaging.

## 2018-07-12 ENCOUNTER — Ambulatory Visit: Payer: Self-pay | Admitting: Internal Medicine

## 2018-07-16 ENCOUNTER — Other Ambulatory Visit: Payer: Self-pay | Admitting: Internal Medicine

## 2018-07-17 ENCOUNTER — Telehealth: Payer: Self-pay | Admitting: Internal Medicine

## 2018-07-17 ENCOUNTER — Other Ambulatory Visit: Payer: Self-pay | Admitting: Internal Medicine

## 2018-07-17 NOTE — Telephone Encounter (Signed)
MD approved and sent electronically to pof../lmb  

## 2018-07-17 NOTE — Telephone Encounter (Signed)
Last refill was 06/14/18 Last OV was 06/12/18 Next OV is 07/26/18

## 2018-07-17 NOTE — Telephone Encounter (Signed)
Copied from [place] [id number]. Topic: Quick Communication - Rx Refill/Question >> Jul 17, 2018  2:26 PM Judyann Munson wrote: Medication: HYDROcodone-acetaminophen ([place]) 7.5-325 MG tablet  Has the patient contacted their pharmacy? {yes   Preferred Pharmacy (with phone number or street name):  [facility], [place] [phone] (Phone) [phone] (Fax)    Agent: Please be advised that RX refills may take up to 3 business days. We ask that you follow-up with your pharmacy.

## 2018-07-17 NOTE — Telephone Encounter (Signed)
Check [place] registry last filled alprazolam 06/26/2018, and generic lunesta 06/20/18.Marland KitchenJohny Kim

## 2018-07-18 ENCOUNTER — Other Ambulatory Visit: Payer: Self-pay | Admitting: Internal Medicine

## 2018-07-18 MED ORDER — HYDROCODONE-ACETAMINOPHEN 7.5-325 MG PO TABS: 1.0000 | ORAL_TABLET | Freq: Three times a day (TID) | ORAL | 0 refills | Status: DC | PRN

## 2018-07-23 NOTE — Progress Notes (Signed)
Subjective:    Patient ID: Dawn Kim, female    DOB: [birth year], 72 y.o.   MRN: [id number]  HPI The patient is here for follow up.  Recurrent falls:  This month she has fallen 4 times.  One time she did go to the ED.  She denies any prodromal symptoms.  She denies lightheadedness or dizziness.  She does not know what happens when she falls.  Her niece did call and told us that 1 of the falls while she was in the hospital with her husband she held her breath and then passed out.  Her last visit to the emergency room was earlier this month.  She does have one staple in the back of her head from the fall and she does need that removed today.  She denies any headaches, lightheadedness or dizziness.  There have been no changes in her vision.  Today - she feels "ok".  She feels "slow, slow thinking".  Her thoughts are slow.  She feels confused at times.  Her memory is not as good.  She did not sleep at all last night.    Anxiety: She is taking her medication daily as prescribed. She denies any side effects from the medication. She feels her anxiety is fairly controlled and she is happy with her current dose of medication.   Insomnia:  She is taking the lunesta nightly.  She did not sleep at all last night.  Medication will sometimes get her 3 hours of sleep or so.  She wonders about increasing the dose to 3 mg..    Chronic pain in knees:  Took her hydrocodone at am - pain level 4/10 right now.  She is unsure how much medication works.  She typically takes the medication 1-2 times a day.  She wondered about trying something topical for her knee pain.  Hypertension: She is taking her medication daily as prescribed.  She does not monitor her blood pressure at home.  She denies any lightheadedness or headaches.  Osteopenia: She is due for a bone density and states she would like to have one.  She is not exercising regularly.  She does take calcium and vitamin D daily.  Medications and allergies  reviewed with patient and updated if appropriate.  Patient Active Problem List   Diagnosis Date Noted  . 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 Delirium 06/06/2018  . Frequent falls 06/06/2018  . AKI (acute kidney injury) ([place]) 06/06/2018  . T12 compression fracture ([place]) 03/28/2018  . Midline low back pain without sciatica 03/21/2018  . Head trauma 03/21/2018  . Decreased appetite 01/06/2018  . Shortness of breath 01/06/2018  . Frequent urination 01/06/2018  . Encounter for pain management 10/05/2017  . Chronic lower back pain 09/19/2017  . 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 Osteopenia 06/21/2016  . Lower leg edema 05/31/2016  . Hypothyroidism 10/22/2015  . Depression 10/22/2015  . Anxiety 10/22/2015  . GERD (gastroesophageal reflux disease) 10/22/2015  . Insomnia 10/22/2015  . Foot pain, right 10/22/2015  . Bilateral knee pain 10/22/2015  . HTN (hypertension) 01/20/2012  . Lipid disorder 01/20/2012    Current Outpatient Medications on File Prior to Visit  Medication Sig Dispense Refill  . ALPRAZolam (XANAX) 0.5 MG tablet TAKE 1 TABLET BY MOUTH THREE TIMES DAILY **LORAZEPAM  DISCONTINUED-NO  LONGER  TAKING** 90 tablet 0  . calcium carbonate (OS-CAL - DOSED IN MG OF ELEMENTAL CALCIUM) 1250 (500 Ca) MG  tablet Take 1 tablet (500 mg of elemental calcium total) by mouth daily.    . cholecalciferol (VITAMIN D) 1000 UNITS tablet Take 1,000 Units by mouth daily.    . eszopiclone (LUNESTA) 2 MG TABS tablet TAKE 1 TABLET BY MOUTH AT BEDTIME AS NEEDED FOR SLEEP **TAKE  IMMEDIATELY  BEFORE  BEDTIME** 30 tablet 0  . ferrous sulfate 325 (65 FE) MG EC tablet Take 325 mg by mouth daily with breakfast.    . fish oil-omega-3 fatty acids 1000 MG capsule Take 1 g by mouth 2 (two) times daily.     [name] HYDROcodone-acetaminophen (NORCO) 7.5-325 MG tablet Take 1 tablet by mouth every 8 (eight) hours as needed (chronic knee pain). 90 tablet 0  . levocetirizine (XYZAL) 5  MG tablet Take 1 tablet (5 mg total) by mouth every evening. 30 tablet 0  . levothyroxine (SYNTHROID, LEVOTHROID) 112 MCG tablet TAKE 1 TABLET BY MOUTH ONCE DAILY 90 tablet 1  . Multiple Vitamin (MULTIVITAMIN) tablet Take 1 tablet by mouth daily.    [name] omeprazole (PRILOSEC) 40 MG capsule Take 1 capsule (40 mg total) by mouth 2 (two) times daily before a meal. 180 capsule 3  . ramipril (ALTACE) 10 MG capsule Take 1 capsule (10 mg total) by mouth daily. 90 capsule 3  . ranitidine (ZANTAC) 150 MG tablet TAKE 1 TABLET BY MOUTH AT BEDTIME 90 tablet 1  . simvastatin (ZOCOR) 40 MG tablet TAKE 1 TABLET BY MOUTH IN THE EVENING 90 tablet 1  . venlafaxine XR (EFFEXOR XR) 150 MG 24 hr capsule Take 1 capsule (150 mg total) by mouth daily with breakfast. 1 capsule 0   No current facility-administered medications on file prior to visit.     Past Medical History:  Diagnosis Date  . Anxiety   . Cataract    Bil  . Depression   . Hypercholesteremia   . Hypertension   . Thyroid disease     Past Surgical History:  Procedure Laterality Date  . CHOLECYSTECTOMY    . COLONOSCOPY    . FOOT SURGERY  2011   right foot  . JOINT REPLACEMENT  2010   left knee  . POLYPECTOMY    . TONSILLECTOMY      Social History   Socioeconomic History  . Marital status: Married    Spouse name: Not on file  . Number of children: 3  . Years of education: 49  . Highest education level: Not on file  Occupational History  . Occupation: Retired   Scientific laboratory technician  . Financial resource strain: Not very hard  . Food insecurity:    Worry: Never true    Inability: Never true  . Transportation needs:    Medical: No    Non-medical: No  Tobacco Use  . Smoking status: Former Smoker    Types: Cigarettes    Last attempt to quit: 11/04/2009    Years since quitting: 8.7  . Smokeless tobacco: Never Used  Substance and Sexual Activity  . Alcohol use: No  . Drug use: No  . Sexual activity: Never  Lifestyle  . Physical  activity:    Days per week: 0 days    Minutes per session: 0 min  . Stress: Rather much  Relationships  . Social connections:    Talks on phone: Not on file    Gets together: Not on file    Attends religious service: Not on file    Active member of club or organization: Not on file  Attends meetings of clubs or organizations: Not on file    Relationship status: Married  Other Topics Concern  . Not on file  Social History Narrative  . Not on file    Family History  Problem Relation Age of Onset  . Heart disease Mother   . Drug abuse Sister     Review of Systems  Constitutional: Negative for fever.  Eyes: Negative for visual disturbance.  Respiratory: Negative for cough, shortness of breath and wheezing.   Cardiovascular: Negative for chest pain, palpitations and leg swelling.  Neurological: Negative for dizziness, light-headedness and headaches.       Objective:   Vitals:   07/26/18 1102  BP: 118/74  Pulse: 72  Resp: 16  Temp: 97.8 F (36.6 C)  SpO2: 96%   BP Readings from Last 3 Encounters:  07/26/18 118/74  07/11/18 (!) 158/75  06/12/18 122/78   Wt Readings from Last 3 Encounters:  07/26/18 188 lb (85.3 kg)  07/11/18 200 lb (90.7 kg)  06/12/18 190 lb 1.9 oz (86.2 kg)   Body mass index is 31.28 kg/m.   Physical Exam    Constitutional: Appears well-developed and well-nourished. No distress.  HENT:  Head: Normocephalic.  One staple in the right posterior head-wound has healed.  No erythema, swelling or tenderness.  No hematoma Neck: Neck supple. No tracheal deviation present. No thyromegaly present.  No cervical lymphadenopathy Cardiovascular: Normal rate, regular rhythm and normal heart sounds.   No murmur heard. No carotid bruit .  No edema Pulmonary/Chest: Effort normal and breath sounds normal. No respiratory distress. No has no wheezes. No rales.  Skin: Skin is warm and dry. Not diaphoretic.  Psychiatric: Anxious mood and affect. Behavior is  normal.     1 staple removed from right posterior head without difficulty.  No evidence of infection.  Wound has healed.   Assessment & Plan:   45 minutes were spent face-to-face with the patient, over 50% of which was spent counseling regarding her current medical problems, polypharmacy and several medication changes were made.   See Problem List for Assessment and Plan of chronic medical problems.

## 2018-07-26 ENCOUNTER — Encounter: Payer: Self-pay | Admitting: Internal Medicine

## 2018-07-26 ENCOUNTER — Telehealth: Payer: Self-pay | Admitting: Internal Medicine

## 2018-07-26 ENCOUNTER — Ambulatory Visit: Payer: Medicare Other | Admitting: Internal Medicine

## 2018-07-26 VITALS — BP 118/74 | HR 72 | Temp 97.8°F | Resp 16 | Ht 65.0 in | Wt 188.0 lb

## 2018-07-26 DIAGNOSIS — M85839 Other specified disorders of bone density and structure, unspecified forearm: Secondary | ICD-10-CM | POA: Diagnosis not present

## 2018-07-26 DIAGNOSIS — F329 Major depressive disorder, single episode, unspecified: Secondary | ICD-10-CM | POA: Diagnosis not present

## 2018-07-26 DIAGNOSIS — F32A Depression, unspecified: Secondary | ICD-10-CM

## 2018-07-26 DIAGNOSIS — G4700 Insomnia, unspecified: Secondary | ICD-10-CM | POA: Diagnosis not present

## 2018-07-26 DIAGNOSIS — I10 Essential (primary) hypertension: Secondary | ICD-10-CM

## 2018-07-26 DIAGNOSIS — F419 Anxiety disorder, unspecified: Secondary | ICD-10-CM | POA: Diagnosis not present

## 2018-07-26 DIAGNOSIS — S0990XA Unspecified injury of head, initial encounter: Secondary | ICD-10-CM

## 2018-07-26 DIAGNOSIS — R410 Disorientation, unspecified: Secondary | ICD-10-CM

## 2018-07-26 DIAGNOSIS — M25561 Pain in right knee: Secondary | ICD-10-CM

## 2018-07-26 DIAGNOSIS — M25562 Pain in left knee: Secondary | ICD-10-CM

## 2018-07-26 DIAGNOSIS — R296 Repeated falls: Secondary | ICD-10-CM

## 2018-07-26 DIAGNOSIS — G8929 Other chronic pain: Secondary | ICD-10-CM

## 2018-07-26 MED ORDER — RAMIPRIL 5 MG PO CAPS: 5.0000 mg | ORAL_CAPSULE | Freq: Every day | ORAL | 3 refills | Status: DC

## 2018-07-26 MED ORDER — LEVOCETIRIZINE DIHYDROCHLORIDE 5 MG PO TABS: 5.0000 mg | ORAL_TABLET | Freq: Every evening | ORAL | 1 refills | Status: DC

## 2018-07-26 MED ORDER — FAMOTIDINE 40 MG PO TABS: 40.0000 mg | ORAL_TABLET | Freq: Every day | ORAL | 1 refills | Status: DC

## 2018-07-26 MED ORDER — VENLAFAXINE HCL ER 150 MG PO CP24: 150.0000 mg | ORAL_CAPSULE | Freq: Every day | ORAL | 0 refills | Status: DC

## 2018-07-26 MED ORDER — QUETIAPINE FUMARATE 25 MG PO TABS: 25.0000 mg | ORAL_TABLET | Freq: Every day | ORAL | 0 refills | Status: DC

## 2018-07-26 MED ORDER — DICLOFENAC SODIUM 1 % TD GEL: 4.0000 g | Freq: Four times a day (QID) | TRANSDERMAL | 5 refills | Status: DC

## 2018-07-26 NOTE — Telephone Encounter (Signed)
Copied from [place] [id number]. Topic: Quick Communication - See Telephone Encounter >> Jul 26, 2018 11:40 AM Ivar Drape wrote: CRM for notification. See Telephone encounter for: 07/26/18. Patient's niece, Threasa Beards, wanted the provider to know that the patient passed out at the hospital because she got upset and held her breath.

## 2018-07-26 NOTE — Assessment & Plan Note (Signed)
Has tried multiple medications in the past and is currently on Lunesta 2 mg nightly-she did not sleep at all last night and sometimes only gets 3 hours of sleep She is interested in increasing the [place], but I am concerned that the Johnnye Sima is the cause of some of her symptoms of slow thinking, confusion, worsening of her memory We will discontinue Lunesta Trial of Seroquel 25 mg at bedtime Follow-up in 2 weeks

## 2018-07-26 NOTE — Telephone Encounter (Signed)
noted 

## 2018-07-26 NOTE — Assessment & Plan Note (Signed)
She is experiencing confusion and that she states slow thinking and memory difficulties Likely side effects from medications She was taken off the gabapentin as that was likely contributing Some of her medications were decreased, but she is still having symptoms We will discontinue hydrocodone and Lunesta-most likely the Johnnye Sima is contributing to her confusion and memory issues Follow-up in 2 weeks

## 2018-07-26 NOTE — Patient Instructions (Addendum)
Stop taking the pain medication (hydrocodone).  Stop the lunesta.  Start seroquel at bedtime.     Stop the zantac (ranitidine).  Start pepcid (famotidine ) at bedtime.     Decrease ramipril to 5 mg - a new prescription was sent to your pharmacy.     Try diclofenac gel on your areas of arthritis.     Your bone density was ordered.   Follow up in 2 weeks, sooner if needed

## 2018-07-26 NOTE — Assessment & Plan Note (Signed)
Due for DEXA-ordered Taking calcium and vitamin D Not currently exercising

## 2018-07-26 NOTE — Assessment & Plan Note (Signed)
Chronic in nature Trial of diclofenac gel She states the hydrocodone-acetaminophen may not actually be working, but she is still taking it 1-2 times a day Given her confusion and recurrent falls will discontinue Tylenol as needed only

## 2018-07-26 NOTE — Assessment & Plan Note (Signed)
She fell earlier this month and sustained a head injury and has 1 staple in the right posterior head, which was removed today Wound has healed and there is no evidence of infection She is asymptomatic Making medication adjustment to prevent future falls

## 2018-07-26 NOTE — Assessment & Plan Note (Signed)
Blood pressure very well controlled here today She denies any headaches or lightheadedness, but given her recurrent falls and concerned about the possibility of orthostatic hypotension Will decrease Lamictal to 5 mg daily Follow-up in 2 weeks

## 2018-07-26 NOTE — Assessment & Plan Note (Signed)
Appears to be fairly controlled We will continue Xanax 1-2 times daily and Effexor at current dose

## 2018-07-26 NOTE — Assessment & Plan Note (Signed)
Controlled, stable Continue current dose of medication  

## 2018-07-26 NOTE — Assessment & Plan Note (Signed)
Has had several falls over the past month or 2 and has been to the ED on 2 occasions Some of her falls are secondary to side effects from medications and polypharmacy We will be making several medication changes today including discontinuing her hydrocodone and Lunesta Substitution for Johnnye Sima will be Seroquel if covered Follow-up in 2 weeks

## 2018-08-03 DIAGNOSIS — H524 Presbyopia: Secondary | ICD-10-CM | POA: Diagnosis not present

## 2018-08-03 DIAGNOSIS — H52203 Unspecified astigmatism, bilateral: Secondary | ICD-10-CM | POA: Diagnosis not present

## 2018-08-03 DIAGNOSIS — Z961 Presence of intraocular lens: Secondary | ICD-10-CM | POA: Diagnosis not present

## 2018-08-08 NOTE — Progress Notes (Signed)
Subjective:    Patient ID: Dawn Kim, female    DOB: [birth year], 72 y.o.   MRN: [id number]  HPI The patient is here for follow up.  Recurrent falls, slow thinking, confusion, poor memory:  At her last visit we tapered her off the lunesta and started seroquel at bedtime.  She denies any any falls since she was here last.  She denies lightheadedness/dizziness.  She is walking better.  She still thinks her memory is not great, but denies any confusion.  Insomnia:  She is sleeping good with the seroquel and denies feeling groggy in the morning.  She is very happy with the medication.  Anxiety: She is taking her medication daily as prescribed-Effexor daily and alprazolam 3 times a day. She denies any side effects from the medication. She feels her anxiety is not always controlled.  We have discussed at previous visits tapering her off of the Effexor and trying something different since she has been on the medication for years but may not be as effective as it was.  She is in favor of doing this.  Chronic knee pain:  She was taking hydrocodone, but did not think the medication worked much so we stopped the medication and I prescribed diclofenac gel.  She is using the gel and it does help.  She did have some the pain medication left and has been taking one in the morning and 1 at night.  She is still unsure how much it helps, but it may help a little bit.  Hypertension: we decreased her ramipril at her last visit to make sure some of her falls were not related to postural hypotension.  She is taking her medication daily. She is compliant with a low sodium diet.  She denies chest pain, palpitations, edema, shortness of breath and regular headaches. She is not exercising regularly.  She does not monitor her blood pressure at home.    GERD: we stopped the zantac and started pepcid at her last visit.  She is taking her medication daily as prescribed.  She denies any GERD symptoms and feels her GERD is  well controlled.    Medications and allergies reviewed with patient and updated if appropriate.  Patient Active Problem List   Diagnosis Date Noted  . 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 Confusion 06/06/2018  . Frequent falls 06/06/2018  . AKI (acute kidney injury) ([place]) 06/06/2018  . T12 compression fracture ([place]) 03/28/2018  . Midline low back pain without sciatica 03/21/2018  . Head trauma 03/21/2018  . Decreased appetite 01/06/2018  . Shortness of breath 01/06/2018  . Frequent urination 01/06/2018  . Encounter for pain management 10/05/2017  . Chronic lower back pain 09/19/2017  . 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 Osteopenia 06/21/2016  . Hypothyroidism 10/22/2015  . Depression 10/22/2015  . Anxiety 10/22/2015  . GERD (gastroesophageal reflux disease) 10/22/2015  . Insomnia 10/22/2015  . Foot pain, right 10/22/2015  . Bilateral knee pain 10/22/2015  . HTN (hypertension) 01/20/2012  . Lipid disorder 01/20/2012    Current Outpatient Medications on File Prior to Visit  Medication Sig Dispense Refill  . ALPRAZolam (XANAX) 0.5 MG tablet TAKE 1 TABLET BY MOUTH THREE TIMES DAILY **LORAZEPAM  DISCONTINUED-NO  LONGER  TAKING** 90 tablet 0  . calcium carbonate (OS-CAL - DOSED IN MG OF ELEMENTAL CALCIUM) 1250 (500 Ca) MG tablet Take 1 tablet (500 mg of elemental calcium total) by mouth daily.    [name]  cholecalciferol (VITAMIN D) 1000 UNITS tablet Take 1,000 Units by mouth daily.    . diclofenac sodium (VOLTAREN) 1 % GEL Apply 4 g topically 4 (four) times daily. 100 g 5  . famotidine (PEPCID) 40 MG tablet Take 1 tablet (40 mg total) by mouth at bedtime. 90 tablet 1  . ferrous sulfate 325 (65 FE) MG EC tablet Take 325 mg by mouth daily with breakfast.    . fish oil-omega-3 fatty acids 1000 MG capsule Take 1 g by mouth 2 (two) times daily.     [name] levocetirizine (XYZAL) 5 MG tablet Take 1 tablet (5 mg total) by mouth every evening. 90 tablet 1  .  levothyroxine (SYNTHROID, LEVOTHROID) 112 MCG tablet TAKE 1 TABLET BY MOUTH ONCE DAILY 90 tablet 1  . Multiple Vitamin (MULTIVITAMIN) tablet Take 1 tablet by mouth daily.    [name] omeprazole (PRILOSEC) 40 MG capsule Take 1 capsule (40 mg total) by mouth 2 (two) times daily before a meal. 180 capsule 3  . QUEtiapine (SEROQUEL) 25 MG tablet Take 1 tablet (25 mg total) by mouth at bedtime. 30 tablet 0  . ramipril (ALTACE) 5 MG capsule Take 1 capsule (5 mg total) by mouth daily. 90 capsule 3  . simvastatin (ZOCOR) 40 MG tablet TAKE 1 TABLET BY MOUTH IN THE EVENING 90 tablet 1  . venlafaxine XR (EFFEXOR XR) 150 MG 24 hr capsule Take 1 capsule (150 mg total) by mouth daily with breakfast. 1 capsule 0   No current facility-administered medications on file prior to visit.     Past Medical History:  Diagnosis Date  . Anxiety   . Cataract    Bil  . Depression   . Hypercholesteremia   . Hypertension   . Thyroid disease     Past Surgical History:  Procedure Laterality Date  . CHOLECYSTECTOMY    . COLONOSCOPY    . FOOT SURGERY  2011   right foot  . JOINT REPLACEMENT  2010   left knee  . POLYPECTOMY    . TONSILLECTOMY      Social History   Socioeconomic History  . Marital status: Married    Spouse name: Not on file  . Number of children: 3  . Years of education: 82  . Highest education level: Not on file  Occupational History  . Occupation: Retired   Scientific laboratory technician  . Financial resource strain: Not very hard  . Food insecurity:    Worry: Never true    Inability: Never true  . Transportation needs:    Medical: No    Non-medical: No  Tobacco Use  . Smoking status: Former Smoker    Types: Cigarettes    Last attempt to quit: 11/04/2009    Years since quitting: 8.7  . Smokeless tobacco: Never Used  Substance and Sexual Activity  . Alcohol use: No  . Drug use: No  . Sexual activity: Never  Lifestyle  . Physical activity:    Days per week: 0 days    Minutes per session: 0 min  .  Stress: Rather much  Relationships  . Social connections:    Talks on phone: Not on file    Gets together: Not on file    Attends religious service: Not on file    Active member of club or organization: Not on file    Attends meetings of clubs or organizations: Not on file    Relationship status: Married  Other Topics Concern  . Not on file  Social  History Narrative  . Not on file    Family History  Problem Relation Age of Onset  . Heart disease Mother   . Drug abuse Sister     Review of Systems  Constitutional: Negative for chills and fever.  Respiratory: Negative for cough, shortness of breath and wheezing.   Cardiovascular: Negative for chest pain, palpitations and leg swelling.  Neurological: Negative for dizziness, light-headedness and headaches.       Objective:   Vitals:   08/09/18 1102  BP: 136/82  Pulse: 61  Resp: 16  Temp: 97.8 F (36.6 C)  SpO2: 99%   BP Readings from Last 3 Encounters:  08/09/18 136/82  07/26/18 118/74  07/11/18 (!) 158/75   Wt Readings from Last 3 Encounters:  08/09/18 188 lb (85.3 kg)  07/26/18 188 lb (85.3 kg)  07/11/18 200 lb (90.7 kg)   Body mass index is 31.28 kg/m.   Physical Exam    Constitutional: Appears well-developed and well-nourished. No distress.  HENT:  Head: Normocephalic and atraumatic.  Neck: Neck supple. No tracheal deviation present. No thyromegaly present.  No cervical lymphadenopathy Cardiovascular: Normal rate, regular rhythm and normal heart sounds.   No murmur heard. No carotid bruit .  No edema Pulmonary/Chest: Effort normal and breath sounds normal. No respiratory distress. No has no wheezes. No rales.  Skin: Skin is warm and dry. Not diaphoretic.  Psychiatric: Normal mood and affect. Behavior is normal.      Assessment & Plan:    See Problem List for Assessment and Plan of chronic medical problems.

## 2018-08-09 ENCOUNTER — Encounter: Payer: Self-pay | Admitting: Internal Medicine

## 2018-08-09 ENCOUNTER — Ambulatory Visit (INDEPENDENT_AMBULATORY_CARE_PROVIDER_SITE_OTHER)
Admission: RE | Admit: 2018-08-09 | Discharge: 2018-08-09 | Disposition: A | Discharge status: Home / Self Care | Payer: Medicare Other | Source: Ambulatory Visit | Attending: Internal Medicine | Admitting: Internal Medicine

## 2018-08-09 ENCOUNTER — Ambulatory Visit: Payer: Medicare Other | Admitting: Internal Medicine

## 2018-08-09 VITALS — BP 136/82 | HR 61 | Temp 97.8°F | Resp 16 | Ht 65.0 in | Wt 188.0 lb

## 2018-08-09 DIAGNOSIS — F419 Anxiety disorder, unspecified: Secondary | ICD-10-CM

## 2018-08-09 DIAGNOSIS — R296 Repeated falls: Secondary | ICD-10-CM | POA: Diagnosis not present

## 2018-08-09 DIAGNOSIS — G8929 Other chronic pain: Secondary | ICD-10-CM

## 2018-08-09 DIAGNOSIS — M25562 Pain in left knee: Secondary | ICD-10-CM

## 2018-08-09 DIAGNOSIS — I10 Essential (primary) hypertension: Secondary | ICD-10-CM | POA: Diagnosis not present

## 2018-08-09 DIAGNOSIS — K219 Gastro-esophageal reflux disease without esophagitis: Secondary | ICD-10-CM

## 2018-08-09 DIAGNOSIS — M85832 Other specified disorders of bone density and structure, left forearm: Secondary | ICD-10-CM | POA: Diagnosis not present

## 2018-08-09 DIAGNOSIS — M85839 Other specified disorders of bone density and structure, unspecified forearm: Secondary | ICD-10-CM

## 2018-08-09 DIAGNOSIS — G4700 Insomnia, unspecified: Secondary | ICD-10-CM | POA: Diagnosis not present

## 2018-08-09 DIAGNOSIS — M25561 Pain in right knee: Secondary | ICD-10-CM

## 2018-08-09 MED ORDER — SERTRALINE HCL 50 MG PO TABS: ORAL_TABLET | ORAL | 3 refills | Status: DC

## 2018-08-09 MED ORDER — VENLAFAXINE HCL ER 37.5 MG PO CP24: ORAL_CAPSULE | ORAL | 0 refills | Status: DC

## 2018-08-09 NOTE — Assessment & Plan Note (Signed)
She denies any falls since her last visit Recurrent falls will likely side effect from medication-most likely Lunesta, which has been discontinued Encouraged her to be more active and start walking more to improve her strength and balance

## 2018-08-09 NOTE — Assessment & Plan Note (Signed)
GERD controlled Continue daily medication  

## 2018-08-09 NOTE — Patient Instructions (Addendum)
Take 1000 mg of Tylenol three times a day.   Medications reviewed and updated.  Changes include :     Take 2 caps of the venlafaxine daily for one week and then decrease to 1 cap daily then stop. Then start sertraline (zoloft) 50 mg daily for 2 weeks and then increase to 2 tabs (100 mg ) daily   Make an appointment with Dr Tamala Julian for your back   Please followup in 3 months

## 2018-08-09 NOTE — Assessment & Plan Note (Signed)
BP well controlled And decrease her ramipril dose in the last visit to prevent possibility of falls related to postural hypotension Current regimen effective and well tolerated Continue current medications at current doses

## 2018-08-09 NOTE — Assessment & Plan Note (Signed)
Chronic bilateral knee pain secondary to severe osteoarthritis Has seen orthopedics-no other treatment options Was taking hydrocodone-acetaminophen 7.5-325 twice daily and is currently still taking leftover medication, but we did discontinue this at her last visit 2 weeks ago Discussed side effects and since she is only getting minimal benefit out of the medication-she states it does not work that well we will continue to hold and see how she does without it Continue diclofenac gel as needed Start Tylenol 1000 mg 3 times daily Increase walking

## 2018-08-09 NOTE — Assessment & Plan Note (Signed)
She has not tolerated several medications in several were not effective Seroquel started 2 weeks ago and she is tolerating it well without side effects Denies feeling groggy in the morning and sleep is well controlled Continue 25 mg at bedtime

## 2018-08-09 NOTE — Assessment & Plan Note (Signed)
Not ideally controlled Continue alprazolam 3 times daily, which she has been on for years We will taper her off of venlafaxine-she is also been on this for years and it no longer seems to be effective-take 75 mg daily for 1 week, then 37.5 mg daily for 1 week and stop.  If she has any side effects while tapering off the medication she will call Once she discontinues venlafaxine she will start the sertraline 50 mg daily for 2 weeks and then 100 mg daily Follow-up with me in 2 months, sooner if needed

## 2018-08-10 ENCOUNTER — Telehealth: Payer: Self-pay | Admitting: Internal Medicine

## 2018-08-10 MED ORDER — VENLAFAXINE HCL 37.5 MG PO TABS: 37.5000 mg | ORAL_TABLET | Freq: Two times a day (BID) | ORAL | 0 refills | Status: DC

## 2018-08-10 NOTE — Telephone Encounter (Signed)
Copied from [place] [id number]. Topic: Quick Communication - See Telephone Encounter >> Aug 10, 2018  8:26 AM Conception Chancy, NT wrote: CRM for notification. See Telephone encounter for: 08/10/18.  Patient is calling and states the pharmacy is needing approval for venlafaxine XR (EFFEXOR XR) 37.5 MG 24 hr capsule.

## 2018-08-10 NOTE — Telephone Encounter (Signed)
Rx faxed

## 2018-08-10 NOTE — Telephone Encounter (Signed)
Spoke with pharmacy in regards. They stated that pts insurance with only cover 1 1/2 tablets daily but not 2 full pills daily. Are you ok with doing 1 1/2 tablets daily for a week then go to 1 tablet daily for a week?

## 2018-08-10 NOTE — Telephone Encounter (Signed)
Yes, that is ok 

## 2018-08-10 NOTE — Telephone Encounter (Signed)
Dawn Kim from [facility] is calling.  There is confusion over the directions for this medication and they need to speak with someone about proper directions.

## 2018-08-10 NOTE — Telephone Encounter (Signed)
Copied from [place] [id number]. Topic: Quick Communication - Rx Refill/Question >> Aug 10, 2018  3:41 PM Reyne Dumas L wrote: Medication: sertraline (ZOLOFT) 50 MG tablet  Kayla with [facility] calling in.  Pt will need prior auth because it exceeds maximum daily dose.  Preferred Pharmacy (with phone number or street name): [facility], [place] [phone] (Phone) [phone] (Fax)  Agent: Please be advised that RX refills may take up to 3 business days. We ask that you follow-up with your pharmacy.

## 2018-08-11 NOTE — Telephone Encounter (Signed)
PA for zoloft has been initiated.   KEY [id number]

## 2018-08-11 NOTE — Telephone Encounter (Signed)
Spoke with pharmacy and they stated that they have pts medication ready for pick up. Medication directions were clarified.

## 2018-08-14 DIAGNOSIS — M85832 Other specified disorders of bone density and structure, left forearm: Secondary | ICD-10-CM | POA: Diagnosis not present

## 2018-08-17 ENCOUNTER — Ambulatory Visit: Payer: Self-pay | Admitting: *Deleted

## 2018-08-17 NOTE — Telephone Encounter (Signed)
Patient does not understand how to take sertraline (ZOLOFT) 50 MG tablet. She thinks she has the instructions backwards.       Reason for Disposition . Caller has medication question only, adult not sick, and triager answers question  Answer Assessment - Initial Assessment Questions 1. SYMPTOMS: "Do you have any symptoms?"     Patient wants to know how to take her new antidepressant.  Protocols used: MEDICATION QUESTION CALL-A-AH  Patient states she has been off the Effexor for several weeks now- told patient- since she is completely off Effexor- she can start the sertraline. Instructions given as in medication list- 1 daily for 2 weeks- then 2 daily as normal dose.

## 2018-08-17 NOTE — Telephone Encounter (Signed)
PA has been approved. Pharmacy is aware.

## 2018-08-18 ENCOUNTER — Telehealth: Payer: Self-pay | Admitting: Internal Medicine

## 2018-08-18 ENCOUNTER — Ambulatory Visit: Payer: Self-pay | Admitting: Internal Medicine

## 2018-08-18 NOTE — Telephone Encounter (Signed)
Patient called and is confused about her medications. She says that "I was taking Venlafaxine for years and I stopped taking it. When I saw Dr. Quay Burow, she gave me a prescription 37.5 mg, Zoloft 50 mg. Is it ok to take Zoloft 50 mg and my other morning medications? I get confused when it says to take it for a week then increase it then stop. I just don't know what to do. Maybe I need to come to the office on Monday to get this straightened out."  I advised that Venlafaxine 37.5 was ordered on 08/10/18 to take 1-1/2 tablets daily x 1 week, then 1 tablet daily for 1 week, then stop the medication. She says "see, that is confusing to me. I still have the medicine and I haven't taken it. What about the Zoloft?" I advised Zoloft says to start when you are off the Velafaxine-take 1 tab daily for 2 weeks, then 2 tabs daily. She says "I've been taking 2 tablets all along. I will just take 2 in the morning with my Altace and other morning medicines. Tonight, what do I take?" I advised Seroquel at bedtime and Pepcid at bedtime. She says "ok, what about Lunesta, do I take that too." I advised Johnnye Sima is no longer on her profile, it says Seroquel, lunesta stopped. She says "well, that's right. I don't take Lunesta anymore." I advised to get rid of the medications she's not supposed to be taking, she verbalized understanding. I also advised this will be sent to Dr. Quay Burow for review on Monday and someone from the office may call her to help with her medications, she verbalized understanding and says that will help.

## 2018-08-21 ENCOUNTER — Other Ambulatory Visit: Payer: Self-pay | Admitting: Internal Medicine

## 2018-08-21 NOTE — Telephone Encounter (Signed)
Spoke with pt to clarify directions on how to take venlafaxine and zoloft. Pt expressed understanding. I advised that she please call back if she has anymore questions about how to take these 2 medications. She understood.

## 2018-08-21 NOTE — Telephone Encounter (Signed)
Copied from [place] [id number]. Topic: Quick Communication - Rx Refill/Question >> Aug 21, 2018  8:57 AM Bea Graff, NT wrote: Medication: ALPRAZolam Duanne Moron) 0.5 MG tablet   Has the patient contacted their pharmacy? Yes.   (Agent: If no, request that the patient contact the pharmacy for the refill.) (Agent: If yes, when and what did the pharmacy advise?)  Preferred Pharmacy (with phone number or street name): [facility], [place] [phone] (Phone) [phone] (Fax)    Agent: Please be advised that RX refills may take up to 3 business days. We ask that you follow-up with your pharmacy.

## 2018-08-21 NOTE — Telephone Encounter (Signed)
Pt calling back and still seems confused of how she should be taking the Velafaxine and then when she should take the Zoloft. Please advise.

## 2018-08-22 ENCOUNTER — Other Ambulatory Visit: Payer: Self-pay | Admitting: Internal Medicine

## 2018-08-22 MED ORDER — ALPRAZOLAM 0.5 MG PO TABS: 0.5000 mg | ORAL_TABLET | Freq: Three times a day (TID) | ORAL | 0 refills | Status: DC | PRN

## 2018-08-22 NOTE — Telephone Encounter (Signed)
Requested medication (s) are due for refill today: yes  Requested medication (s) are on the active medication list: yes  Last refill:  07/17/18  Future visit scheduled: yes  Notes to clinic:  Not delegated    Requested Prescriptions  Pending Prescriptions Disp Refills   ALPRAZolam (XANAX) 0.5 MG tablet 90 tablet 0     Not Delegated - Psychiatry:  Anxiolytics/Hypnotics Failed - 08/22/2018  6:49 AM      Failed - This refill cannot be delegated      Passed - Urine Drug Screen completed in last 360 days.      Passed - Valid encounter within last 6 months    Recent Outpatient Visits          1 week ago Essential hypertension   [place], Claudina Lick, MD   3 weeks ago Osteopenia of forearm, unspecified laterality   [place], MD   2 months ago AKI (acute kidney injury) [facility])   [place], MD   3 months ago Encounter for pain management   [place], MD   5 months ago Midline low back pain without sciatica, unspecified chronicity   [place], MD      Future Appointments            In 1 week Lyndal Pulley, DO [place], [place]   In 1 month [place], Claudina Lick, MD [place], [place]   In 1 month  [place], [facility]

## 2018-08-22 NOTE — Telephone Encounter (Signed)
Check [place] registry last filled 07/23/2018.Marland KitchenJohny Chess

## 2018-08-22 NOTE — Telephone Encounter (Signed)
Copied from [place] [id number]. Topic: Quick Communication - Rx Refill/Question >> Aug 22, 2018  9:22 AM Leward Quan A wrote: Medication: QUEtiapine (SEROQUEL) 25 MG tablet  Has the patient contacted their pharmacy? Yes.     Preferred Pharmacy (with phone number or street name): [place], [place] [phone] (Phone) [phone] (Fax)    Agent: Please be advised that RX refills may take up to 3 business days. We ask that you follow-up with your pharmacy.

## 2018-08-22 NOTE — Telephone Encounter (Signed)
Patient was advised by pharmacy to contact office stating this medication needs prior authorization before it can be filled.

## 2018-08-23 NOTE — Telephone Encounter (Signed)
LVM for pt to call back about this medication. She is not supposed to be taking medication as discussed the other day.

## 2018-08-24 NOTE — Telephone Encounter (Signed)
LVM for pt to call back in regards.  

## 2018-08-29 ENCOUNTER — Ambulatory Visit: Payer: Self-pay | Admitting: Family Medicine

## 2018-08-30 ENCOUNTER — Ambulatory Visit: Payer: Self-pay | Admitting: Family Medicine

## 2018-09-04 NOTE — Progress Notes (Deleted)
Subjective:    Patient ID: Dawn Kim, female    DOB: 06/10/1946, 72 y.o.   MRN: [id number]  HPI The patient is here for follow up.  Anxiety: We tapered her off the venlafaxine at her last visit.  We started sertraline.  She is taking her medication daily as prescribed. She denies any side effects from the medication. She feels her anxiety is well controlled and she is happy with her current dose of medication.   Insomnia:  She is taking Seroquel nightly.  Medication works well and she denies any side effects.  She does not feel groggy in the morning.  Chronic knee pain: She is using diclofenac gel topically.  She is taking Tylenol.  Hypertension: She is taking her medication daily. She is compliant with a low sodium diet.  She denies chest pain, palpitations, edema, shortness of breath and regular headaches. She is exercising regularly.  She does not monitor her blood pressure at home.    GERD:  She is taking her medication daily as prescribed.  She denies any GERD symptoms and feels her GERD is well controlled.    Medications and allergies reviewed with patient and updated if appropriate.  Patient Active Problem List   Diagnosis Date Noted  . 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 Confusion 06/06/2018  . Frequent falls 06/06/2018  . AKI (acute kidney injury) ([place]) 06/06/2018  . T12 compression fracture ([place]) 03/28/2018  . Midline low back pain without sciatica 03/21/2018  . Head trauma 03/21/2018  . Shortness of breath 01/06/2018  . Encounter for pain management 10/05/2017  . Chronic lower back pain 09/19/2017  . 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 Osteopenia 06/21/2016  . Hypothyroidism 10/22/2015  . Depression 10/22/2015  . Anxiety 10/22/2015  . GERD (gastroesophageal reflux disease) 10/22/2015  . Insomnia 10/22/2015  . Foot pain, right 10/22/2015  . Bilateral knee pain 10/22/2015  . HTN (hypertension) 01/20/2012  . Lipid disorder  01/20/2012    Current Outpatient Medications on File Prior to Visit  Medication Sig Dispense Refill  . ALPRAZolam (XANAX) 0.5 MG tablet Take 1 tablet (0.5 mg total) by mouth 3 (three) times daily as needed for anxiety. 90 tablet 0  . calcium carbonate (OS-CAL - DOSED IN MG OF ELEMENTAL CALCIUM) 1250 (500 Ca) MG tablet Take 1 tablet (500 mg of elemental calcium total) by mouth daily.    . cholecalciferol (VITAMIN D) 1000 UNITS tablet Take 1,000 Units by mouth daily.    . diclofenac sodium (VOLTAREN) 1 % GEL Apply 4 g topically 4 (four) times daily. 100 g 5  . famotidine (PEPCID) 40 MG tablet Take 1 tablet (40 mg total) by mouth at bedtime. 90 tablet 1  . ferrous sulfate 325 (65 FE) MG EC tablet Take 325 mg by mouth daily with breakfast.    . fish oil-omega-3 fatty acids 1000 MG capsule Take 1 g by mouth 2 (two) times daily.     [name] levocetirizine (XYZAL) 5 MG tablet Take 1 tablet (5 mg total) by mouth every evening. 90 tablet 1  . levothyroxine (SYNTHROID, LEVOTHROID) 112 MCG tablet TAKE 1 TABLET BY MOUTH ONCE DAILY 90 tablet 1  . Multiple Vitamin (MULTIVITAMIN) tablet Take 1 tablet by mouth daily.    [name] omeprazole (PRILOSEC) 40 MG capsule Take 1 capsule (40 mg total) by mouth 2 (two) times daily before a meal. 180 capsule 3  . QUEtiapine (SEROQUEL) 25 MG tablet Take 1 tablet (  25 mg total) by mouth at bedtime. 30 tablet 0  . ramipril (ALTACE) 5 MG capsule Take 1 capsule (5 mg total) by mouth daily. 90 capsule 3  . sertraline (ZOLOFT) 50 MG tablet Start when you are off the venlafaxine - take 1 tab daily for two weeks then 2 tabs daily 60 tablet 3  . simvastatin (ZOCOR) 40 MG tablet TAKE 1 TABLET BY MOUTH IN THE EVENING 90 tablet 1  . venlafaxine (EFFEXOR) 37.5 MG tablet Take 1 tablet (37.5 mg total) by mouth 2 (two) times daily. Take 1 1/2 tablets daily for 1 week and then 1 tablet daily for 1 week then stop medication. 18 tablet 0   No current facility-administered medications on file prior to  visit.     Past Medical History:  Diagnosis Date  . Anxiety   . Cataract    Bil  . Depression   . Hypercholesteremia   . Hypertension   . Thyroid disease     Past Surgical History:  Procedure Laterality Date  . CHOLECYSTECTOMY    . COLONOSCOPY    . FOOT SURGERY  2011   right foot  . JOINT REPLACEMENT  2010   left knee  . POLYPECTOMY    . TONSILLECTOMY      Social History   Socioeconomic History  . Marital status: Married    Spouse name: Not on file  . Number of children: 3  . Years of education: 40  . Highest education level: Not on file  Occupational History  . Occupation: Retired   Scientific laboratory technician  . Financial resource strain: Not very hard  . Food insecurity:    Worry: Never true    Inability: Never true  . Transportation needs:    Medical: No    Non-medical: No  Tobacco Use  . Smoking status: Former Smoker    Types: Cigarettes    Last attempt to quit: 11/04/2009    Years since quitting: 8.8  . Smokeless tobacco: Never Used  Substance and Sexual Activity  . Alcohol use: No  . Drug use: No  . Sexual activity: Never  Lifestyle  . Physical activity:    Days per week: 0 days    Minutes per session: 0 min  . Stress: Rather much  Relationships  . Social connections:    Talks on phone: Not on file    Gets together: Not on file    Attends religious service: Not on file    Active member of club or organization: Not on file    Attends meetings of clubs or organizations: Not on file    Relationship status: Married  Other Topics Concern  . Not on file  Social History Narrative  . Not on file    Family History  Problem Relation Age of Onset  . Heart disease Mother   . Drug abuse Sister     Review of Systems     Objective:  There were no vitals filed for this visit. BP Readings from Last 3 Encounters:  08/09/18 136/82  07/26/18 118/74  07/11/18 (!) 158/75   Wt Readings from Last 3 Encounters:  08/09/18 188 lb (85.3 kg)  07/26/18 188 lb (85.3  kg)  07/11/18 200 lb (90.7 kg)   There is no height or weight on file to calculate BMI.   Physical Exam    Constitutional: Appears well-developed and well-nourished. No distress.  HENT:  Head: Normocephalic and atraumatic.  Neck: Neck supple. No tracheal deviation present. No thyromegaly  present.  No cervical lymphadenopathy Cardiovascular: Normal rate, regular rhythm and normal heart sounds.   No murmur heard. No carotid bruit .  No edema Pulmonary/Chest: Effort normal and breath sounds normal. No respiratory distress. No has no wheezes. No rales.  Skin: Skin is warm and dry. Not diaphoretic.  Psychiatric: Normal mood and affect. Behavior is normal.      Assessment & Plan:    See Problem List for Assessment and Plan of chronic medical problems.

## 2018-09-05 ENCOUNTER — Ambulatory Visit: Payer: Self-pay | Admitting: Internal Medicine

## 2018-09-07 NOTE — Progress Notes (Signed)
Subjective:    Patient ID: Dawn Kim, female    DOB: 16-Oct-1945, 72 y.o.   MRN: [id number]  HPI The patient is here for follow up.  Her balance is bad.  She has tremors in her hands and legs and is afraid she is going to fall.  She has not fallen since she was here last.    She is exhausted.    Anxiety: She is taking the alprazolam 3 times a day.  After her last visit we tapered her off the Effexor and started sertraline, which she is taking daily.  Depression: She is taking her medication daily as prescribed. She denies any side effects from the medication. She feels her depression is well controlled and she is happy with her current dose of medication.   Chronic bilateral knee pain: She is no longer taking any pain medication.  She is using diclofenac gel and taking Tylenol.    Hypertension: She is taking her medication daily. She is compliant with a low sodium diet.  She denies chest pain, palpitations, edema, shortness of breath and regular headaches. She is exercising regularly.  She does not monitor her blood pressure at home.    GERD: She is taking Pepcid as prescribed.  She feels her GERD is well controlled.  Insomnia: She is taking the Seroquel nightly and feels her sleep is well controlled.  She denies any side effects.   Medications and allergies reviewed with patient and updated if appropriate.  Patient Active Problem List   Diagnosis Date Noted  . 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 Confusion 06/06/2018  . Frequent falls 06/06/2018  . AKI (acute kidney injury) ([place]) 06/06/2018  . T12 compression fracture ([place]) 03/28/2018  . Midline low back pain without sciatica 03/21/2018  . Head trauma 03/21/2018  . Shortness of breath 01/06/2018  . Encounter for pain management 10/05/2017  . Chronic lower back pain 09/19/2017  . 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 Osteopenia 06/21/2016  . Hypothyroidism 10/22/2015  . Depression  10/22/2015  . Anxiety 10/22/2015  . GERD (gastroesophageal reflux disease) 10/22/2015  . Insomnia 10/22/2015  . Foot pain, right 10/22/2015  . Bilateral knee pain 10/22/2015  . HTN (hypertension) 01/20/2012  . Lipid disorder 01/20/2012    Current Outpatient Medications on File Prior to Visit  Medication Sig Dispense Refill  . ALPRAZolam (XANAX) 0.5 MG tablet Take 1 tablet (0.5 mg total) by mouth 3 (three) times daily as needed for anxiety. 90 tablet 0  . calcium carbonate (OS-CAL - DOSED IN MG OF ELEMENTAL CALCIUM) 1250 (500 Ca) MG tablet Take 1 tablet (500 mg of elemental calcium total) by mouth daily.    . cholecalciferol (VITAMIN D) 1000 UNITS tablet Take 1,000 Units by mouth daily.    . diclofenac sodium (VOLTAREN) 1 % GEL Apply 4 g topically 4 (four) times daily. 100 g 5  . famotidine (PEPCID) 40 MG tablet Take 1 tablet (40 mg total) by mouth at bedtime. 90 tablet 1  . ferrous sulfate 325 (65 FE) MG EC tablet Take 325 mg by mouth daily with breakfast.    . fish oil-omega-3 fatty acids 1000 MG capsule Take 1 g by mouth 2 (two) times daily.     [name] levocetirizine (XYZAL) 5 MG tablet Take 1 tablet (5 mg total) by mouth every evening. 90 tablet 1  . levothyroxine (SYNTHROID, LEVOTHROID) 112 MCG tablet TAKE 1 TABLET BY MOUTH ONCE DAILY 90 tablet 1  .  Multiple Vitamin (MULTIVITAMIN) tablet Take 1 tablet by mouth daily.    [name] omeprazole (PRILOSEC) 40 MG capsule Take 1 capsule (40 mg total) by mouth 2 (two) times daily before a meal. 180 capsule 3  . QUEtiapine (SEROQUEL) 25 MG tablet Take 1 tablet (25 mg total) by mouth at bedtime. 30 tablet 0  . ramipril (ALTACE) 5 MG capsule Take 1 capsule (5 mg total) by mouth daily. 90 capsule 3  . sertraline (ZOLOFT) 50 MG tablet Start when you are off the venlafaxine - take 1 tab daily for two weeks then 2 tabs daily 60 tablet 3  . simvastatin (ZOCOR) 40 MG tablet TAKE 1 TABLET BY MOUTH IN THE EVENING 90 tablet 1   No current facility-administered  medications on file prior to visit.     Past Medical History:  Diagnosis Date  . Anxiety   . Cataract    Bil  . Depression   . Hypercholesteremia   . Hypertension   . Thyroid disease     Past Surgical History:  Procedure Laterality Date  . CHOLECYSTECTOMY    . COLONOSCOPY    . FOOT SURGERY  2011   right foot  . JOINT REPLACEMENT  2010   left knee  . POLYPECTOMY    . TONSILLECTOMY      Social History   Socioeconomic History  . Marital status: Married    Spouse name: Not on file  . Number of children: 3  . Years of education: 2  . Highest education level: Not on file  Occupational History  . Occupation: Retired   Scientific laboratory technician  . Financial resource strain: Not very hard  . Food insecurity:    Worry: Never true    Inability: Never true  . Transportation needs:    Medical: No    Non-medical: No  Tobacco Use  . Smoking status: Former Smoker    Types: Cigarettes    Last attempt to quit: 11/04/2009    Years since quitting: 8.8  . Smokeless tobacco: Never Used  Substance and Sexual Activity  . Alcohol use: No  . Drug use: No  . Sexual activity: Never  Lifestyle  . Physical activity:    Days per week: 0 days    Minutes per session: 0 min  . Stress: Rather much  Relationships  . Social connections:    Talks on phone: Not on file    Gets together: Not on file    Attends religious service: Not on file    Active member of club or organization: Not on file    Attends meetings of clubs or organizations: Not on file    Relationship status: Married  Other Topics Concern  . Not on file  Social History Narrative  . Not on file    Family History  Problem Relation Age of Onset  . Heart disease Mother   . Drug abuse Sister     Review of Systems  Constitutional: Negative for chills and fever.  HENT: Positive for rhinorrhea.   Respiratory: Negative for cough (occ), shortness of breath and wheezing.   Cardiovascular: Negative for chest pain, palpitations and  leg swelling.  Neurological: Negative for dizziness, light-headedness and headaches.  Psychiatric/Behavioral: Positive for dysphoric mood. The patient is nervous/anxious.        Objective:   Vitals:   09/08/18 1335  BP: (!) 160/92  Pulse: 75  Resp: 16  Temp: 98 F (36.7 C)  SpO2: 97%   BP Readings from Last 3  Encounters:  09/08/18 (!) 160/92  08/09/18 136/82  07/26/18 118/74   Wt Readings from Last 3 Encounters:  09/08/18 184 lb 12.8 oz (83.8 kg)  08/09/18 188 lb (85.3 kg)  07/26/18 188 lb (85.3 kg)   Body mass index is 30.75 kg/m.   Physical Exam    Constitutional: Appears well-developed and well-nourished. No distress. Mild tremor in hands and head/mouth HENT:  Head: Normocephalic and atraumatic.  Neck: Neck supple. No tracheal deviation present. No thyromegaly present.  No cervical lymphadenopathy Cardiovascular: Normal rate, regular rhythm and normal heart sounds.   No murmur heard. No carotid bruit .  No edema Pulmonary/Chest: Effort normal and breath sounds normal. No respiratory distress. No has no wheezes. No rales.  Skin: Skin is warm and dry. Not diaphoretic.  Psychiatric: Anxious mood and affect. Behavior is normal.      Assessment & Plan:    See Problem List for Assessment and Plan of chronic medical problems.

## 2018-09-08 ENCOUNTER — Encounter: Payer: Self-pay | Admitting: Internal Medicine

## 2018-09-08 ENCOUNTER — Ambulatory Visit: Payer: Medicare Other | Admitting: Internal Medicine

## 2018-09-08 VITALS — BP 160/92 | HR 75 | Temp 98.0°F | Resp 16 | Ht 65.0 in | Wt 184.8 lb

## 2018-09-08 DIAGNOSIS — F419 Anxiety disorder, unspecified: Secondary | ICD-10-CM | POA: Diagnosis not present

## 2018-09-08 DIAGNOSIS — F32A Depression, unspecified: Secondary | ICD-10-CM

## 2018-09-08 DIAGNOSIS — I10 Essential (primary) hypertension: Secondary | ICD-10-CM | POA: Diagnosis not present

## 2018-09-08 DIAGNOSIS — G4700 Insomnia, unspecified: Secondary | ICD-10-CM

## 2018-09-08 DIAGNOSIS — M25561 Pain in right knee: Secondary | ICD-10-CM

## 2018-09-08 DIAGNOSIS — G8929 Other chronic pain: Secondary | ICD-10-CM

## 2018-09-08 DIAGNOSIS — K219 Gastro-esophageal reflux disease without esophagitis: Secondary | ICD-10-CM

## 2018-09-08 DIAGNOSIS — M25562 Pain in left knee: Secondary | ICD-10-CM

## 2018-09-08 DIAGNOSIS — R296 Repeated falls: Secondary | ICD-10-CM

## 2018-09-08 DIAGNOSIS — F329 Major depressive disorder, single episode, unspecified: Secondary | ICD-10-CM

## 2018-09-08 MED ORDER — QUETIAPINE FUMARATE 50 MG PO TABS: 50.0000 mg | ORAL_TABLET | Freq: Every day | ORAL | 5 refills | Status: DC

## 2018-09-08 MED ORDER — SERTRALINE HCL 50 MG PO TABS: 150.0000 mg | ORAL_TABLET | Freq: Every day | ORAL | 5 refills | Status: DC

## 2018-09-08 NOTE — Assessment & Plan Note (Addendum)
Taking seroquel nightly - takes 4 hr to fall asleep but then sleeps good.  Wakes up fatigued and stays fatigued OSA testing last year was negative Increase to seroquel 50  mg nightly FU in one month

## 2018-09-08 NOTE — Assessment & Plan Note (Signed)
Not ideally controlled Increase sertraline to 150 mg daily FU in 1 month

## 2018-09-08 NOTE — Assessment & Plan Note (Signed)
Continue tylenol and diclofenac gel

## 2018-09-08 NOTE — Assessment & Plan Note (Signed)
No falls since she was here last - previous falls likely from medications Balance is poor - increase activity as tolerated

## 2018-09-08 NOTE — Patient Instructions (Signed)
Changes to your medicaton:   Take three sertraline at night or 150 mg   Take seroquel 50 mg nightly   Continue your other medications.   Follow up in 4 weeks

## 2018-09-08 NOTE — Assessment & Plan Note (Signed)
BP elevated here today No changes - was low in the recent past Continue current medications F/u in 4 weeks

## 2018-09-08 NOTE — Assessment & Plan Note (Signed)
GERD controlled Continue daily medication  

## 2018-09-12 ENCOUNTER — Ambulatory Visit: Payer: Self-pay

## 2018-09-12 NOTE — Telephone Encounter (Signed)
Patient called and says "I was bending over to pick up clothes out of the basket and I got dizzy. I'm fine now. I haven't been sleeping and I don't know if that caused it or not. I saw Dr. Quay Burow and she changed my medication." I advised according to the note by Dr. Quay Burow on 09/08/18 OV to take Seroquel 50 mg at bedtimes and Zoloft 150 mg at bedtime. She says "I take Zoloft in the day and the bottle says take daily." I advised that Dr. Quay Burow indicated at bedtime on the AVS and daily can mean bedtime as well as morning or anytime during the day, as long as it's once a day. Patient verbalized understanding and says she understands now and will try that. I advised if the dizziness comes back and doesn't go away, if she's still not sleeping with the change in the way she takes the Zoloft, to call the office back, she verbalized understanding.   Reason for Disposition . Caller has medication question only, adult not sick, and triager answers question  Protocols used: MEDICATION QUESTION CALL-A-AH

## 2018-09-13 ENCOUNTER — Ambulatory Visit: Payer: Self-pay | Admitting: *Deleted

## 2018-09-13 MED ORDER — SERTRALINE HCL 50 MG PO TABS: 100.0000 mg | ORAL_TABLET | Freq: Every day | ORAL | 5 refills | Status: DC

## 2018-09-13 NOTE — Telephone Encounter (Signed)
Contacted pt regarding instructions for medications; she says that she can not sleep since her medication was changed; reviewed medications/instructions per Dr Quay Burow; the pt verbalized understanding and states that she is taking medication as prescribed; the pt also says that despite taking the sertraline 150 mg at night, quetiapine 50 mg at night and her alprazolam 3 times daily she can not sleep; the pt would like to know what is the next step; the pt was last seen on 09/08/18 by Dr Billey Gosling, [facility]; will route to office for further recommendations;she can be contacted at [phone].       Reason for Disposition . Caller has URGENT medication question about med that PCP prescribed and triager unable to answer question  Answer Assessment - Initial Assessment Questions 1. SYMPTOMS: "Do you have any symptoms?"     Yes not able to sleep since changed to zoloft 2. SEVERITY: If symptoms are present, ask "Are they mild, moderate or severe?"  Protocols used: MEDICATION QUESTION CALL-A-AH

## 2018-09-13 NOTE — Telephone Encounter (Signed)
She was sleeping well prior to medication changes - decrease sertraline back to 100 mg daily.  Continue seroquel 50 mg at bedtime for now.  See how she does tonight she can call with an update tomorrow.   Med list updated

## 2018-09-13 NOTE — Telephone Encounter (Signed)
LVM for pt to call back in regards.  

## 2018-09-13 NOTE — Addendum Note (Signed)
Addended by: Binnie Rail on: 09/13/2018 01:13 PM   Modules accepted: Orders

## 2018-09-14 ENCOUNTER — Telehealth: Payer: Self-pay

## 2018-09-14 NOTE — Telephone Encounter (Signed)
I tried to call patient back on both numbers listed and she did not answer. Will try back later or wait for pt to return call.

## 2018-09-14 NOTE — Telephone Encounter (Signed)
Pt advised of response below.

## 2018-09-14 NOTE — Telephone Encounter (Signed)
Continue tylenol three times a day and use the diclofenac gel.   I do not feel comfortable prescribing her the hydrocodone, but if she wants to go back to pain management I can refer her

## 2018-09-14 NOTE — Telephone Encounter (Signed)
LVM for pt to call back.

## 2018-09-14 NOTE — Telephone Encounter (Signed)
Spoke with pt. Documented in another note.

## 2018-09-14 NOTE — Telephone Encounter (Signed)
Copied from [place] [id number]. Topic: General - Inquiry >> Sep 14, 2018 11:00 AM Scherrie Gerlach wrote: Reason for CRM: pt returning your call. pt states please call her at [phone] >> Sep 14, 2018  1:59 PM Leward Quan A wrote: Patient called back stated that she just had a missed call. Said maybe it was [employer] calling about pain medication. Nothing noted in chart patient was informed. Request a call back stating she is waiting on a call to see if doctor will refill her Hydrocodone for pain she is having. Ph# [phone]

## 2018-09-14 NOTE — Telephone Encounter (Signed)
Knees and feet are in pain due to arthritis. Wants to see what alternative can be sent in to help with pain since she was taken off of her hydrocodone. I gave her the medication changes. She will try them tonight.

## 2018-09-15 ENCOUNTER — Ambulatory Visit: Payer: Self-pay | Admitting: Internal Medicine

## 2018-09-20 ENCOUNTER — Other Ambulatory Visit: Payer: Self-pay | Admitting: Internal Medicine

## 2018-09-20 NOTE — Telephone Encounter (Signed)
[place] Controlled Substance Database checked. Last filled on 08/23/18  Last OV 09/08/18

## 2018-09-20 NOTE — Telephone Encounter (Signed)
Tried calling pt back. No answer and no VM set up on the number listed to call back.

## 2018-09-20 NOTE — Telephone Encounter (Signed)
Pt called and stated that she would like an rx of diclofnac for pain relief. Pt states that she would like the pill form. Please advise [phone]

## 2018-09-20 NOTE — Telephone Encounter (Signed)
I would only recommend the topical diclofenac because of her decreased kidney function

## 2018-09-21 NOTE — Telephone Encounter (Signed)
Spoke with pt and advised of response below. Pt understood.

## 2018-10-05 NOTE — Progress Notes (Signed)
Subjective:    Patient ID: Dawn Kim, female    DOB: 1946/08/04, 73 y.o.   MRN: [id number]  HPI The patient is here for follow up.  Hypertension: She is taking her medication daily. She is compliant with a low sodium diet.  She denies chest pain, palpitations, edema, shortness of breath and regular headaches. She is not exercising regularly.  She does not monitor her blood pressure at home.    Insomnia:  She is taking seroquel at night and she is sleeping well.  She denies any side effects..    chronic b/l knee pain:  She is taking tylenol.  She uses some topical medications.  She continues to have chronic pain and it does limit her.  Depression: She is taking her medication daily as prescribed. She denies any side effects from the medication. She feels her depression is much better controlled and she is happy with her current dose of medication.   Anxiety: She is taking her sertraline and Xanax daily as prescribed. She denies any side effects from the medication. She feels her anxiety is much better controlled and she is happy with her current dose of medication.   Poor balance:  It is much improved.  She is walking better and not hanging on to the wall when she walks.    Medications and allergies reviewed with patient and updated if appropriate.  Patient Active Problem List   Diagnosis Date Noted  . 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 Confusion 06/06/2018  . Frequent falls 06/06/2018  . AKI (acute kidney injury) ([place]) 06/06/2018  . T12 compression fracture ([place]) 03/28/2018  . Midline low back pain without sciatica 03/21/2018  . Head trauma 03/21/2018  . Shortness of breath 01/06/2018  . Encounter for pain management 10/05/2017  . Chronic lower back pain 09/19/2017  . 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 Osteopenia 06/21/2016  . Hypothyroidism 10/22/2015  . Depression 10/22/2015  . Anxiety 10/22/2015  . GERD (gastroesophageal reflux  disease) 10/22/2015  . Insomnia 10/22/2015  . Foot pain, right 10/22/2015  . Bilateral knee pain 10/22/2015  . HTN (hypertension) 01/20/2012  . Lipid disorder 01/20/2012    Current Outpatient Medications on File Prior to Visit  Medication Sig Dispense Refill  . ALPRAZolam (XANAX) 0.5 MG tablet TAKE 1 TABLET BY MOUTH THREE TIMES DAILY AS NEEDED FOR ANXIETY 90 tablet 0  . calcium carbonate (OS-CAL - DOSED IN MG OF ELEMENTAL CALCIUM) 1250 (500 Ca) MG tablet Take 1 tablet (500 mg of elemental calcium total) by mouth daily.    . cholecalciferol (VITAMIN D) 1000 UNITS tablet Take 1,000 Units by mouth daily.    . diclofenac sodium (VOLTAREN) 1 % GEL Apply 4 g topically 4 (four) times daily. 100 g 5  . famotidine (PEPCID) 40 MG tablet Take 1 tablet (40 mg total) by mouth at bedtime. 90 tablet 1  . ferrous sulfate 325 (65 FE) MG EC tablet Take 325 mg by mouth daily with breakfast.    . fish oil-omega-3 fatty acids 1000 MG capsule Take 1 g by mouth 2 (two) times daily.     [name] levocetirizine (XYZAL) 5 MG tablet Take 1 tablet (5 mg total) by mouth every evening. 90 tablet 1  . levothyroxine (SYNTHROID, LEVOTHROID) 112 MCG tablet TAKE 1 TABLET BY MOUTH ONCE DAILY 90 tablet 1  . Multiple Vitamin (MULTIVITAMIN) tablet Take 1 tablet by mouth daily.    [name] omeprazole (PRILOSEC) 40 MG  capsule Take 1 capsule (40 mg total) by mouth 2 (two) times daily before a meal. 180 capsule 3  . QUEtiapine (SEROQUEL) 50 MG tablet Take 1 tablet (50 mg total) by mouth at bedtime. 30 tablet 5  . ramipril (ALTACE) 5 MG capsule Take 1 capsule (5 mg total) by mouth daily. 90 capsule 3  . sertraline (ZOLOFT) 50 MG tablet Take 2 tablets (100 mg total) by mouth at bedtime. 90 tablet 5  . simvastatin (ZOCOR) 40 MG tablet TAKE 1 TABLET BY MOUTH IN THE EVENING 90 tablet 1   No current facility-administered medications on file prior to visit.     Past Medical History:  Diagnosis Date  . Anxiety   . Cataract    Bil  . Depression     . Hypercholesteremia   . Hypertension   . Thyroid disease     Past Surgical History:  Procedure Laterality Date  . CHOLECYSTECTOMY    . COLONOSCOPY    . FOOT SURGERY  2011   right foot  . JOINT REPLACEMENT  2010   left knee  . POLYPECTOMY    . TONSILLECTOMY      Social History   Socioeconomic History  . Marital status: Married    Spouse name: Not on file  . Number of children: 3  . Years of education: 38  . Highest education level: Not on file  Occupational History  . Occupation: Retired   Scientific laboratory technician  . Financial resource strain: Not very hard  . Food insecurity:    Worry: Never true    Inability: Never true  . Transportation needs:    Medical: No    Non-medical: No  Tobacco Use  . Smoking status: Former Smoker    Types: Cigarettes    Last attempt to quit: 11/04/2009    Years since quitting: 8.9  . Smokeless tobacco: Never Used  Substance and Sexual Activity  . Alcohol use: No  . Drug use: No  . Sexual activity: Never  Lifestyle  . Physical activity:    Days per week: 0 days    Minutes per session: 0 min  . Stress: Rather much  Relationships  . Social connections:    Talks on phone: Not on file    Gets together: Not on file    Attends religious service: Not on file    Active member of club or organization: Not on file    Attends meetings of clubs or organizations: Not on file    Relationship status: Married  Other Topics Concern  . Not on file  Social History Narrative  . Not on file    Family History  Problem Relation Age of Onset  . Heart disease Mother   . Drug abuse Sister     Review of Systems  Constitutional: Negative for chills and fever.  Respiratory: Negative for cough, shortness of breath and wheezing.   Cardiovascular: Negative for chest pain, palpitations and leg swelling.  Neurological: Negative for light-headedness and headaches.       Objective:   Vitals:   10/06/18 1427  BP: (!) 160/82  Pulse: 68  Resp: 16  Temp:  98.2 F (36.8 C)  SpO2: 96%   BP Readings from Last 3 Encounters:  10/06/18 (!) 160/82  09/08/18 (!) 160/92  08/09/18 136/82   Wt Readings from Last 3 Encounters:  09/08/18 184 lb 12.8 oz (83.8 kg)  08/09/18 188 lb (85.3 kg)  07/26/18 188 lb (85.3 kg)   Body mass index  is 30.75 kg/m.   Physical Exam    Constitutional: Appears well-developed and well-nourished. No distress.  HENT:  Head: Normocephalic and atraumatic.  Neck: Neck supple. No tracheal deviation present. No thyromegaly present.  No cervical lymphadenopathy Cardiovascular: Normal rate, regular rhythm and normal heart sounds.   No murmur heard. No carotid bruit .  No edema Pulmonary/Chest: Effort normal and breath sounds normal. No respiratory distress. No has no wheezes. No rales.  Skin: Skin is warm and dry. Not diaphoretic.  Psychiatric: Normal mood and affect. Behavior is normal.      Assessment & Plan:    See Problem List for Assessment and Plan of chronic medical problems.

## 2018-10-06 ENCOUNTER — Encounter: Payer: Self-pay | Admitting: Internal Medicine

## 2018-10-06 ENCOUNTER — Ambulatory Visit (INDEPENDENT_AMBULATORY_CARE_PROVIDER_SITE_OTHER): Payer: Medicare Other | Admitting: Internal Medicine

## 2018-10-06 VITALS — BP 160/82 | HR 68 | Temp 98.2°F | Resp 16 | Ht 65.0 in

## 2018-10-06 DIAGNOSIS — M25561 Pain in right knee: Secondary | ICD-10-CM

## 2018-10-06 DIAGNOSIS — F329 Major depressive disorder, single episode, unspecified: Secondary | ICD-10-CM | POA: Diagnosis not present

## 2018-10-06 DIAGNOSIS — G8929 Other chronic pain: Secondary | ICD-10-CM

## 2018-10-06 DIAGNOSIS — G4700 Insomnia, unspecified: Secondary | ICD-10-CM | POA: Diagnosis not present

## 2018-10-06 DIAGNOSIS — F419 Anxiety disorder, unspecified: Secondary | ICD-10-CM

## 2018-10-06 DIAGNOSIS — F32A Depression, unspecified: Secondary | ICD-10-CM

## 2018-10-06 DIAGNOSIS — I10 Essential (primary) hypertension: Secondary | ICD-10-CM | POA: Diagnosis not present

## 2018-10-06 DIAGNOSIS — M25562 Pain in left knee: Secondary | ICD-10-CM

## 2018-10-06 MED ORDER — RAMIPRIL 10 MG PO CAPS: 10.0000 mg | ORAL_CAPSULE | Freq: Every day | ORAL | 5 refills | Status: DC

## 2018-10-06 MED ORDER — SERTRALINE HCL 50 MG PO TABS: 150.0000 mg | ORAL_TABLET | Freq: Every day | ORAL | 5 refills | Status: DC

## 2018-10-06 NOTE — Assessment & Plan Note (Signed)
Much better controlled with sertraline 150 mg daily-continue Continue alprazolam 3 times a day, which she has been on for years BuSpar, Ativan, clonazepam not effective Follow-up in 3 months

## 2018-10-06 NOTE — Assessment & Plan Note (Signed)
Has not tolerated several medications in the past with multiple side effects Doing well on Seroquel 50 mg nightly-continue

## 2018-10-06 NOTE — Assessment & Plan Note (Signed)
Much better controlled with sertraline 150 mg daily-continue Follow-up in 3 months

## 2018-10-06 NOTE — Assessment & Plan Note (Addendum)
BP high today and at her last visit Ramipril was decreased previously for possible orthostatic hypotension causing falls Increase ramipril back to 10 mg daily F/u in 3 months

## 2018-10-06 NOTE — Assessment & Plan Note (Signed)
Severe osteoarthritis Continue Tylenol and topical medications Will not prescribe any narcotics

## 2018-10-06 NOTE — Patient Instructions (Addendum)
  Tests ordered today. Your results will be released to [place] (or called to you) after review, usually within 72hours after test completion. If any changes need to be made, you will be notified at that same time.  Medications reviewed and updated.  Changes include :   Increased ramipril to 10 mg daily for your blood pressure.  Continue all your other medications.    Your prescription(s) have been submitted to your pharmacy. Please take as directed and contact our office if you believe you are having problem(s) with the medication(s).   Please followup in 3 months

## 2018-10-08 ENCOUNTER — Other Ambulatory Visit: Payer: Self-pay | Admitting: Internal Medicine

## 2018-10-11 ENCOUNTER — Ambulatory Visit (INDEPENDENT_AMBULATORY_CARE_PROVIDER_SITE_OTHER): Payer: Medicare Other | Admitting: *Deleted

## 2018-10-11 ENCOUNTER — Ambulatory Visit: Payer: Self-pay | Admitting: Internal Medicine

## 2018-10-11 VITALS — BP 158/90 | HR 63 | Resp 18 | Ht 65.0 in | Wt 190.0 lb

## 2018-10-11 DIAGNOSIS — Z0000 Encounter for general adult medical examination without abnormal findings: Secondary | ICD-10-CM | POA: Diagnosis not present

## 2018-10-11 NOTE — Patient Instructions (Addendum)
Continue doing brain stimulating activities (puzzles, reading, adult coloring books, staying active) to keep memory sharp.   Continue to eat heart healthy diet (full of fruits, vegetables, whole grains, lean protein, water--limit salt, fat, and sugar intake) and increase physical activity as tolerated.  Encouraged patient to increase daily water and healthy fluid intake.  Lower extremity exercises from EMMI given.   Bring a copy of your living will and/or healthcare power of attorney to your next office visit.    Fall Prevention in the Home, Adult Falls can cause injuries. They can happen to people of all ages. There are many things you can do to make your home safe and to help prevent falls. Ask for help when making these changes, if needed. What actions can I take to prevent falls? General Instructions  Use good lighting in all rooms. Replace any light bulbs that burn out.  Turn on the lights when you go into a dark area. Use night-lights.  Keep items that you use often in easy-to-reach places. Lower the shelves around your home if necessary.  Set up your furniture so you have a clear path. Avoid moving your furniture around.  Do not have throw rugs and other things on the floor that can make you trip.  Avoid walking on wet floors.  If any of your floors are uneven, fix them.  Add color or contrast paint or tape to clearly mark and help you see: ? Any grab bars or handrails. ? First and last steps of stairways. ? Where the edge of each step is.  If you use a stepladder: ? Make sure that it is fully opened. Do not climb a closed stepladder. ? Make sure that both sides of the stepladder are locked into place. ? Ask someone to hold the stepladder for you while you use it.  If there are any pets around you, be aware of where they are. What can I do in the bathroom?      Keep the floor dry. Clean up any water that spills onto the floor as soon as it happens.  Remove soap  buildup in the tub or shower regularly.  Use non-skid mats or decals on the floor of the tub or shower.  Attach bath mats securely with double-sided, non-slip rug tape.  If you need to sit down in the shower, use a plastic, non-slip stool.  Install grab bars by the toilet and in the tub and shower. Do not use towel bars as grab bars. What can I do in the bedroom?  Make sure that you have a light by your bed that is easy to reach.  Do not use any sheets or blankets that are too big for your bed. They should not hang down onto the floor.  Have a firm chair that has side arms. You can use this for support while you get dressed. What can I do in the kitchen?  Clean up any spills right away.  If you need to reach something above you, use a strong step stool that has a grab bar.  Keep electrical cords out of the way.  Do not use floor polish or wax that makes floors slippery. If you must use wax, use non-skid floor wax. What can I do with my stairs?  Do not leave any items on the stairs.  Make sure that you have a light switch at the top of the stairs and the bottom of the stairs. If you do not have them,  ask someone to add them for you.  Make sure that there are handrails on both sides of the stairs, and use them. Fix handrails that are broken or loose. Make sure that handrails are as long as the stairways.  Install non-slip stair treads on all stairs in your home.  Avoid having throw rugs at the top or bottom of the stairs. If you do have throw rugs, attach them to the floor with carpet tape.  Choose a carpet that does not hide the edge of the steps on the stairway.  Check any carpeting to make sure that it is firmly attached to the stairs. Fix any carpet that is loose or worn. What can I do on the outside of my home?  Use bright outdoor lighting.  Regularly fix the edges of walkways and driveways and fix any cracks.  Remove anything that might make you trip as you walk  through a door, such as a raised step or threshold.  Trim any bushes or trees on the path to your home.  Regularly check to see if handrails are loose or broken. Make sure that both sides of any steps have handrails.  Install guardrails along the edges of any raised decks and porches.  Clear walking paths of anything that might make someone trip, such as tools or rocks.  Have any leaves, snow, or ice cleared regularly.  Use sand or salt on walking paths during winter.  Clean up any spills in your garage right away. This includes grease or oil spills. What other actions can I take?  Wear shoes that: ? Have a low heel. Do not wear high heels. ? Have rubber bottoms. ? Are comfortable and fit you well. ? Are closed at the toe. Do not wear open-toe sandals.  Use tools that help you move around (mobility aids) if they are needed. These include: ? Canes. ? Walkers. ? Scooters. ? Crutches.  Review your medicines with your doctor. Some medicines can make you feel dizzy. This can increase your chance of falling. Ask your doctor what other things you can do to help prevent falls. Where to find more information  Centers for Disease Control and Prevention, [facility]: https://garcia.biz/  [employer] on Aging: BrainJudge.co.uk Contact a doctor if:  You are afraid of falling at home.  You feel weak, drowsy, or dizzy at home.  You fall at home. Summary  There are many simple things that you can do to make your home safe and to help prevent falls.  Ways to make your home safe include removing tripping hazards and installing grab bars in the bathroom.  Ask for help when making these changes in your home. This information is not intended to replace advice given to you by your health care provider. Make sure you discuss any questions you have with your health care provider. Document Released: 07/17/2009 Document Revised: 05/05/2017 Document Reviewed: 05/05/2017 Elsevier  Interactive Patient Education  2019 [employer].

## 2018-10-11 NOTE — Progress Notes (Addendum)
Subjective:   Dawn Kim is a 73 y.o. female who presents for Medicare Annual (Subsequent) preventive examination.  Review of Systems: No ROS.  Medicare Wellness Visit. Additional risk factors are reflected in the social history.  Sleep patterns: Sleeping better with current medications.    Home Safety/Smoke Alarms: Feels safe in home. Smoke alarms in place.  Living environment; residence and Firearm Safety: 1-story house/ trailer, no firearms, firearms stored safely. Lives with husband, no needs for DME at this time, uses cane occasionally; adequate support system per patient.    Seat Belt Safety/Bike Helmet: Wears seat belt.   Female:   Pap- N/A       Mammo- due 06/2019    Dexa scan- Due 08/2020       CCS- colonoscopy due 08/2022  Cardiac Risk Factors include: advanced age (>44men, >20 women);hypertension;sedentary lifestyle;dyslipidemia     Objective:     Vitals: BP (!) 158/90 (BP Location: Left Arm, Patient Position: Sitting, Cuff Size: Large)   Pulse 63   Resp 18   Ht 5\' 5"  (1.651 m)   Wt 190 lb (86.2 kg)   SpO2 96%   BMI 31.62 kg/m   Body mass index is 31.62 kg/m.  Advanced Directives 10/11/2018 06/07/2018 06/06/2018 10/06/2017 12/29/2016 12/29/2016 09/01/2015  Does Patient Have a Medical Advance Directive? Yes Yes No Yes No No No  Type of Paramedic of [place];Living will [place];Living will - [place];Living will - - -  Does patient want to make changes to medical advance directive? - No - Patient declined - - - - -  Copy of [place] in Chart? No - copy requested No - copy requested - No - copy requested - - -  Would patient like information on creating a medical advance directive? - - - - No - Patient declined No - Patient declined No - patient declined information    Tobacco Social History   Tobacco Use  Smoking Status Former Smoker  . Types: Cigarettes  . Last attempt to  quit: 11/04/2009  . Years since quitting: 8.9  Smokeless Tobacco Never Used     Counseling given: Not Answered    Past Medical History:  Diagnosis Date  . Anxiety   . Cataract    Bil  . Depression   . Hypercholesteremia   . Hypertension   . Thyroid disease    Past Surgical History:  Procedure Laterality Date  . CHOLECYSTECTOMY    . COLONOSCOPY    . FOOT SURGERY  2011   right foot  . JOINT REPLACEMENT  2010   left knee  . POLYPECTOMY    . REPLACEMENT TOTAL KNEE Left   . TONSILLECTOMY     Family History  Problem Relation Age of Onset  . Heart disease Mother   . Drug abuse Sister    Social History   Socioeconomic History  . Marital status: Married    Spouse name: Not on file  . Number of children: 3  . Years of education: 53  . Highest education level: Not on file  Occupational History  . Occupation: Retired   Scientific laboratory technician  . Financial resource strain: Not very hard  . Food insecurity:    Worry: Never true    Inability: Never true  . Transportation needs:    Medical: No    Non-medical: No  Tobacco Use  . Smoking status: Former Smoker    Types: Cigarettes  Last attempt to quit: 11/04/2009    Years since quitting: 8.9  . Smokeless tobacco: Never Used  Substance and Sexual Activity  . Alcohol use: No  . Drug use: No  . Sexual activity: Never  Lifestyle  . Physical activity:    Days per week: 0 days    Minutes per session: 0 min  . Stress: To some extent  Relationships  . Social connections:    Talks on phone: Twice a week    Gets together: Twice a week    Attends religious service: 1 to 4 times per year    Active member of club or organization: Yes    Attends meetings of clubs or organizations: 1 to 4 times per year    Relationship status: Married  Other Topics Concern  . Not on file  Social History Narrative   Cares for 65yo husband; has daughter locally who can help if needed    Outpatient Encounter Medications as of 10/11/2018  Medication  Sig  . ALPRAZolam (XANAX) 0.5 MG tablet TAKE 1 TABLET BY MOUTH THREE TIMES DAILY AS NEEDED FOR ANXIETY  . calcium carbonate (OS-CAL - DOSED IN MG OF ELEMENTAL CALCIUM) 1250 (500 Ca) MG tablet Take 1 tablet (500 mg of elemental calcium total) by mouth daily.  . cholecalciferol (VITAMIN D) 1000 UNITS tablet Take 1,000 Units by mouth daily.  . diclofenac sodium (VOLTAREN) 1 % GEL Apply 4 g topically 4 (four) times daily.  . famotidine (PEPCID) 40 MG tablet Take 1 tablet (40 mg total) by mouth at bedtime.  . ferrous sulfate 325 (65 FE) MG EC tablet Take 325 mg by mouth daily with breakfast.  . fish oil-omega-3 fatty acids 1000 MG capsule Take 1 g by mouth 2 (two) times daily.   [name] levocetirizine (XYZAL) 5 MG tablet Take 1 tablet (5 mg total) by mouth every evening.  [name] levothyroxine (SYNTHROID, LEVOTHROID) 112 MCG tablet TAKE 1 TABLET BY MOUTH ONCE DAILY  . Multiple Vitamin (MULTIVITAMIN) tablet Take 1 tablet by mouth daily.  [name] omeprazole (PRILOSEC) 40 MG capsule Take 1 capsule (40 mg total) by mouth 2 (two) times daily before a meal.  . QUEtiapine (SEROQUEL) 50 MG tablet Take 1 tablet (50 mg total) by mouth at bedtime.  . ramipril (ALTACE) 10 MG capsule Take 1 capsule (10 mg total) by mouth daily.  . sertraline (ZOLOFT) 50 MG tablet Take 3 tablets (150 mg total) by mouth at bedtime.  . simvastatin (ZOCOR) 40 MG tablet TAKE 1 TABLET BY MOUTH IN THE EVENING   No facility-administered encounter medications on file as of 10/11/2018.     Activities of Daily Living In your present state of health, do you have any difficulty performing the following activities: 10/11/2018 06/07/2018  Hearing? N N  Vision? N N  Difficulty concentrating or making decisions? Y N  Walking or climbing stairs? Y Y  Dressing or bathing? N N  Doing errands, shopping? N Y  Conservation officer, nature and eating ? N -  Using the Toilet? N -  In the past six months, have you accidently leaked urine? N -  Do you have problems with loss of bowel  control? N -  Managing your Medications? N -  Managing your Finances? N -  Housekeeping or managing your Housekeeping? N -  Some recent data might be hidden    Patient Care Team: Binnie Rail, MD as PCP - General (Internal Medicine)    Assessment:   This is a routine wellness  examination for Dawn Kim. Physical assessment deferred to PCP.  Exercise Activities and Dietary recommendations Current Exercise Habits: The patient does not participate in regular exercise at present, Exercise limited by: psychological condition(s);orthopedic condition(s);cardiac condition(s) Diet (meal preparation, eat out, water intake, caffeinated beverages, dairy products, fruits and vegetables): in general, a "healthy" diet   Encouraged patient to increase daily water and healthy fluid intake, especially in wake of recent reported hospitalization for dehydration/falls.       Goals    . DIET - INCREASE WATER INTAKE     Continue to stay hydrated with water and juice.     . Exercise 3x per week (30 min per time)     Lower extremity exercises from EMMI provided; encouraged seeking out social outlets like silver sneakers, which patient mentioned, to keep gait stable, prevent falls, strengthen muscles, and increase socialization/support systems.     . Patient Stated     Start to increase physical my activity by going to [facility] and participating in [employer]. Monitor what I eat to be healthier. Use portion control and drink more water.        Fall Risk Fall Risk  10/11/2018 10/06/2017 03/01/2017 02/17/2016  Falls in the past year? 1 Yes Yes Yes  Number falls in past yr: 1 1 2  or more 2 or more  Injury with Fall? 0 No Yes No  Risk Factor Category  - - High Fall Risk -  Risk for fall due to : History of fall(s);Impaired balance/gait;Medication side effect - History of fall(s);Impaired balance/gait -  Follow up Education provided;Falls prevention discussed - - -    Depression Screen PHQ 2/9 Scores 10/11/2018  10/06/2017 03/01/2017 02/17/2016  PHQ - 2 Score 1 3 0 0  PHQ- 9 Score 3 7 2  -     Cognitive Function MMSE - Mini Mental State Exam 10/11/2018 10/06/2017  Not completed: Refused Refused       Ad8 score reviewed for issues:  Issues making decisions: no  Less interest in hobbies / activities: no  Repeats questions, stories (family complaining): no  Trouble using ordinary gadgets (microwave, computer, phone):no  Forgets the month or year: no  Mismanaging finances: no  Remembering appts: no  Daily problems with thinking and/or memory: yes Ad8 score is= 1  Pt. states she notices she is forgetful, but since recent medication changes, she has been thinking more clearly, not getting lost while driving.    Immunization History  Administered Date(s) Administered  . Influenza Split 07/04/2012  . Influenza, High Dose Seasonal PF 06/21/2016, 06/12/2018  . Influenza,inj,Quad PF,6+ Mos 06/25/2014  . Influenza-Unspecified 06/05/2015, 07/05/2017  . Pneumococcal Conjugate-13 09/01/2016  . Pneumococcal Polysaccharide-23 10/06/2017  . Tdap 07/31/2018  . Zoster Recombinat (Shingrix) 03/04/2017, 05/25/2017    Qualifies for Shingles Vaccine? Completed course 2018  Screening Tests Health Maintenance  Topic Date Due  . MAMMOGRAM  03/15/2019  . Fecal DNA (Cologuard)  05/10/2019  . DEXA SCAN  08/09/2020  . TETANUS/TDAP  07/31/2028  . INFLUENZA VACCINE  Completed  . Hepatitis C Screening  Completed  . PNA vac Low Risk Adult  Completed       Plan:     Continue doing brain stimulating activities (puzzles, reading, adult coloring books, staying active) to keep memory sharp.   Continue to eat heart healthy diet (full of fruits, vegetables, whole grains, lean protein, water--limit salt, fat, and sugar intake) and increase physical activity as tolerated.  Encouraged patient to increase daily water and healthy fluid intake.  Lower extremity exercises from EMMI given.   Bring a copy of your  living will and/or healthcare power of attorney to your next office visit.     I have personally reviewed and noted the following in the patient's chart:   . Medical and social history . Use of alcohol, tobacco or illicit drugs  . Current medications and supplements . Functional ability and status . Nutritional status . Physical activity . Advanced directives . List of other physicians . Vitals . Screenings to include cognitive, depression, and falls . Referrals and appointments  In addition, I have reviewed and discussed with patient certain preventive protocols, quality metrics, and best practice recommendations. A written personalized care plan for preventive services as well as general preventive health recommendations were provided to patient.     Alphia Moh, RN  10/11/2018   Medical screening examination/treatment/procedure(s) were performed by non-physician practitioner and as supervising physician I was immediately available for consultation/collaboration. I agree with above. Binnie Rail, MD

## 2018-10-17 ENCOUNTER — Other Ambulatory Visit: Payer: Self-pay | Admitting: Internal Medicine

## 2018-10-17 NOTE — Telephone Encounter (Signed)
Check [place] registry last filled 09/20/2018.Marland KitchenJohny Chess

## 2018-10-27 ENCOUNTER — Ambulatory Visit: Payer: Self-pay | Admitting: *Deleted

## 2018-10-27 NOTE — Telephone Encounter (Signed)
She was here on 10/06/18 to see me and felt her depression and anxiety were better and controlled.  I do not want to make any changes at this time.  She can come in for a visit to discuss if she wishes

## 2018-10-27 NOTE — Telephone Encounter (Signed)
Summary: Call back    Patient is taking sertraline (ZOLOFT) 50 MG tablet, she states that she does not feel that it is helping her and can she up the dosage? Or should she take it differently? She has been taking for three months. Please advise     Patient has been taking medication for 3 months. Patient is taking 150 mg at night. She feels that it is not helping - she is not feeling any better at all. Patient states her mood has not changed. Patient states she is willing to try something else if Dr Quay Burow feels that she needs to change.Told patient I would let Dr Quay Burow know and we would let her know what she wants her to do.  Reason for Disposition . Caller has NON-URGENT medication question about med that PCP prescribed and triager unable to answer question  Answer Assessment - Initial Assessment Questions 1. SYMPTOMS: "Do you have any symptoms?"     Mood is not better- no happier on this medication  Protocols used: MEDICATION QUESTION CALL-A-AH

## 2018-10-30 NOTE — Telephone Encounter (Signed)
LVM for pt to call back in regards.  

## 2018-11-03 ENCOUNTER — Telehealth: Payer: Self-pay | Admitting: Internal Medicine

## 2018-11-03 NOTE — Telephone Encounter (Signed)
LVM advising pt that no refill will be done due to it being requested too early.

## 2018-11-03 NOTE — Telephone Encounter (Signed)
Patient requesting refill on alprazolam to be sent to [facility] on [facility].

## 2018-11-03 NOTE — Telephone Encounter (Signed)
[place] Controlled Substance Database checked. Last filled on 10/18/18

## 2018-11-03 NOTE — Telephone Encounter (Signed)
It is too early - this can not be filled until it is due -

## 2018-11-14 ENCOUNTER — Telehealth: Payer: Self-pay | Admitting: Internal Medicine

## 2018-11-14 NOTE — Telephone Encounter (Signed)
Copied from [place] [id number]. Topic: General - Other >> Nov 14, 2018 12:56 PM Dawn Kim, [place] C wrote: Reason for CRM: pt is calling in to request something for a head ache. Pt says that she has been taking tylenol. Pt says that she has a tention headache.  Pharmacy: [place], [place] [phone] (Phone) [phone] (Fax)   CB: [phone]

## 2018-11-14 NOTE — Telephone Encounter (Signed)
I would advise taking 600 mg of advil or ibuprofen once only.  This is not something she can take on a regular basis but once is ok

## 2018-11-14 NOTE — Telephone Encounter (Signed)
Pt returned Dawn Kim's call. [name] unavailable, I advised pt per PCP message below, pt expressed understanding. Pt says that she will call back if she have any questions or concerns.

## 2018-11-14 NOTE — Telephone Encounter (Signed)
LVM to call back in regards. 

## 2018-11-16 ENCOUNTER — Telehealth: Payer: Self-pay | Admitting: Internal Medicine

## 2018-11-16 MED ORDER — ALPRAZOLAM 0.5 MG PO TABS: 0.5000 mg | ORAL_TABLET | Freq: Three times a day (TID) | ORAL | 0 refills | Status: DC | PRN

## 2018-11-16 NOTE — Telephone Encounter (Signed)
Last refill of xanax was 10/18/18 Last OV was 10/06/18 Next OV 01/10/19  Please advise on pain medication for foot pain.

## 2018-11-16 NOTE — Telephone Encounter (Signed)
Will you please schedule pt to come in to have foot evaluated. It can be with sports medicine or whoever else she can see. Let me know when it is made. Thank you!

## 2018-11-16 NOTE — Telephone Encounter (Signed)
She needs to have her foot pain evaluated prior to having any pain medications - needs appt - can see sports med or someone else in office   Xanax refilled

## 2018-11-16 NOTE — Telephone Encounter (Signed)
Copied from [place] [id number]. Topic: Quick Communication - Rx Refill/Question >> Nov 16, 2018  9:51 AM Alanda Slim E wrote: Medication: ALPRAZolam Duanne Moron) 0.5 MG tablet  HYDROcodone-acetaminophen (NORCO) 7.5-325 MG tablet - Pt needs this for her foot pain, she takes husband to Dr. Visits often and the tylenol isnt helping as much. She know Dr. Quay Burow doesn't like her to take pain meds but she really would like this mediation because it helps with her severe foot pain/ please advise   Has the patient contacted their pharmacy? No  Preferred Pharmacy (with phone number or street name): [place], [place] [phone] (Phone) [phone] (Fax)    Agent: Please be advised that RX refills may take up to 3 business days. We ask that you follow-up with your pharmacy.

## 2018-11-16 NOTE — Telephone Encounter (Signed)
Left message for patient to call back to schedule an appointment with Sports Med (possibly Dr Raeford Razor tomorrow?)

## 2018-11-19 NOTE — Progress Notes (Signed)
[facility] [address] [place], [place] [address] Phone: [phone] Subjective:    I Dawn Kim am serving as a Education administrator for Dr. Hulan Saas.    CC: Foot pain  VPX:[id number]  Dawn Kim is a 73 y.o. female coming in with complaint of bilateral foot pain. States that she has patches on the bottom of her feet. 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 Pain with walking. History of falling. Has fallen at least 4 times. Wears a pain patch on the bottom of her feet. Uses a cane to walk. History of knee surgery on the left knee as well as a blood clot. Lower leg discoloration/bruising on the left leg. Was standing on books when she fell. Remembers her left foot turning black. Sore is TTP. Toes on the left foot are bruised. History of her toes turning black on the left side.    Medicatons: Tylenol, advil (cannot take), was recently taking gabapentin      Past Medical History:  Diagnosis Date  . Anxiety   . Cataract    Bil  . Depression   . Hypercholesteremia   . Hypertension   . Thyroid disease    Past Surgical History:  Procedure Laterality Date  . CHOLECYSTECTOMY    . COLONOSCOPY    . FOOT SURGERY  2011   right foot  . JOINT REPLACEMENT  2010   left knee  . POLYPECTOMY    . REPLACEMENT TOTAL KNEE Left   . TONSILLECTOMY     Social History   Socioeconomic History  . Marital status: Married    Spouse name: Not on file  . Number of children: 3  . Years of education: 32  . Highest education level: Not on file  Occupational History  . Occupation: Retired   Scientific laboratory technician  . Financial resource strain: Not very hard  . Food insecurity:    Worry: Never true    Inability: Never true  . Transportation needs:    Medical: No    Non-medical: No  Tobacco Use  . Smoking status: Former Smoker    Types: Cigarettes    Last attempt to quit: 11/04/2009    Years since quitting: 9.0  . Smokeless tobacco:  Never Used  Substance and Sexual Activity  . Alcohol use: No  . Drug use: No  . Sexual activity: Never  Lifestyle  . Physical activity:    Days per week: 0 days    Minutes per session: 0 min  . Stress: To some extent  Relationships  . Social connections:    Talks on phone: Twice a week    Gets together: Twice a week    Attends religious service: 1 to 4 times per year    Active member of club or organization: Yes    Attends meetings of clubs or organizations: 1 to 4 times per year    Relationship status: Married  Other Topics Concern  . Not on file  Social History Narrative   Cares for 59yo husband; has daughter locally who can help if needed   Allergies  Allergen Reactions  . Gabapentin     Caused parkinsonism  . Lunesta [Eszopiclone]     Ataxia, confusion  . Penicillins Hives    Has patient had a PCN reaction causing immediate rash, facial/tongue/throat swelling, SOB or lightheadedness with hypotension: No Has patient had a PCN reaction causing severe rash involving mucus membranes  or skin necrosis: Yes  Has patient had a PCN reaction that required hospitalization: No Has patient had a PCN reaction occurring within the last 10 years: No  If all of the above answers are "NO", then may proceed with Cephalosporin use.   [name] Amoxicillin Rash    Has patient had a PCN reaction causing immediate rash, facial/tongue/throat swelling, SOB or lightheadedness with hypotension: No  Has patient had a PCN reaction causing severe rash involving mucus membranes or skin necrosis: Yes Has patient had a PCN reaction that required hospitalization No Has patient had a PCN reaction occurring within the last 10 years: No If all of the above answers are "NO", then may proceed with Cephalosporin use.    Family History  Problem Relation Age of Onset  . Heart disease Mother   . Drug abuse Sister     Current Outpatient Medications (Endocrine & Metabolic):  .  levothyroxine (SYNTHROID,  LEVOTHROID) 112 MCG tablet, TAKE 1 TABLET BY MOUTH ONCE DAILY  Current Outpatient Medications (Cardiovascular):  .  ramipril (ALTACE) 10 MG capsule, Take 1 capsule (10 mg total) by mouth daily. .  simvastatin (ZOCOR) 40 MG tablet, TAKE 1 TABLET BY MOUTH IN THE EVENING  Current Outpatient Medications (Respiratory):  .  levocetirizine (XYZAL) 5 MG tablet, Take 1 tablet (5 mg total) by mouth every evening.   Current Outpatient Medications (Hematological):  .  ferrous sulfate 325 (65 FE) MG EC tablet, Take 325 mg by mouth daily with breakfast. .  cilostazol (PLETAL) 50 MG tablet, Take 0.5 tablets (25 mg total) by mouth 2 (two) times daily.  Current Outpatient Medications (Other):  [name]  ALPRAZolam (XANAX) 0.5 MG tablet, Take 1 tablet (0.5 mg total) by mouth 3 (three) times daily as needed. for anxiety .  calcium carbonate (OS-CAL - DOSED IN MG OF ELEMENTAL CALCIUM) 1250 (500 Ca) MG tablet, Take 1 tablet (500 mg of elemental calcium total) by mouth daily. .  cholecalciferol (VITAMIN D) 1000 UNITS tablet, Take 1,000 Units by mouth daily. .  diclofenac sodium (VOLTAREN) 1 % GEL, Apply 4 g topically 4 (four) times daily. .  famotidine (PEPCID) 40 MG tablet, Take 1 tablet (40 mg total) by mouth at bedtime. .  fish oil-omega-3 fatty acids 1000 MG capsule, Take 1 g by mouth 2 (two) times daily.  .  Multiple Vitamin (MULTIVITAMIN) tablet, Take 1 tablet by mouth daily. [name]  omeprazole (PRILOSEC) 40 MG capsule, Take 1 capsule (40 mg total) by mouth 2 (two) times daily before a meal. .  QUEtiapine (SEROQUEL) 50 MG tablet, Take 1 tablet (50 mg total) by mouth at bedtime. .  sertraline (ZOLOFT) 50 MG tablet, Take 3 tablets (150 mg total) by mouth at bedtime.    Past medical history, social, surgical and family history all reviewed in electronic medical record.  No pertanent information unless stated regarding to the chief complaint.   Review of Systems:  No headache, visual changes, nausea, vomiting,  diarrhea, constipation, dizziness, abdominal pain, skin rash, fevers, chills, night sweats, weight loss, swollen lymph nodes,  chest pain, shortness of breath, mood changes.  Positive muscle aches, body aches, joint swelling  Objective  Blood pressure 140/90, pulse 68, height 5\' 5"  (1.651 m), weight 194 lb (88 kg), SpO2 97 %.    General: No apparent distress alert not oriented and affect normal, dressed appropriately.  Tangential thought process. HEENT: Pupils equal but dilated and fixed, extraocular movements intact  Respiratory: Patient's speak in full sentences and does not  appear short of breath  Cardiovascular: Trace lower extremity edema, tender, no erythema  Skin: Warm dry intact with no signs of infection or rash on extremities or on axial skeleton.  Abdomen: Soft nontender  Neuro: Cranial nerves II through XII are intact, neurovascularly intact in all extremities with 2+ DTRs  Lymph: No lymphadenopathy of posterior or anterior cervical chain or axillae bilaterally.  Gait antalgic MSK:  tender with mild limited range of motion and good stability and symmetric strength and tone of shoulders, elbows, wrist, hip, knee and ankles bilaterally.  Knee replacement noted. Foot exam bilaterally does show the patient does have a bluish hue to her legs.  1+ distal pulses noted of the dorsalis pedal patient on exam there showed transverse breakdown of the transverse arch.  Patient has mild splaying between the first and second toes bilaterally.  Diffusely tender to even light palpation. Mild neuropathy likely noted   Impression and Recommendations:     This case required medical decision making of moderate complexity. The above documentation has been reviewed and is accurate and complete Lyndal Pulley, DO       Note: This dictation was prepared with Dragon dictation along with smaller phrase technology. Any transcriptional errors that result from this process are unintentional.

## 2018-11-20 ENCOUNTER — Encounter: Payer: Self-pay | Admitting: Family Medicine

## 2018-11-20 ENCOUNTER — Ambulatory Visit: Payer: Medicare Other | Admitting: Family Medicine

## 2018-11-20 VITALS — BP 140/90 | HR 68 | Ht 65.0 in | Wt 194.0 lb

## 2018-11-20 DIAGNOSIS — M79604 Pain in right leg: Secondary | ICD-10-CM

## 2018-11-20 DIAGNOSIS — M79671 Pain in right foot: Secondary | ICD-10-CM | POA: Diagnosis not present

## 2018-11-20 DIAGNOSIS — M79672 Pain in left foot: Secondary | ICD-10-CM

## 2018-11-20 DIAGNOSIS — R410 Disorientation, unspecified: Secondary | ICD-10-CM | POA: Diagnosis not present

## 2018-11-20 DIAGNOSIS — M79605 Pain in left leg: Secondary | ICD-10-CM

## 2018-11-20 DIAGNOSIS — M216X9 Other acquired deformities of unspecified foot: Secondary | ICD-10-CM

## 2018-11-20 MED ORDER — CILOSTAZOL 50 MG PO TABS: 25.0000 mg | ORAL_TABLET | Freq: Two times a day (BID) | ORAL | 1 refills | Status: DC

## 2018-11-20 NOTE — Assessment & Plan Note (Signed)
Difficulty with confusion.  Could be secondary to benzodiazepine use.  Patient also slowly gotten difficulty with pain management.

## 2018-11-20 NOTE — Assessment & Plan Note (Signed)
Bilateral foot pain.  Some mild decrease in distal pulses.  ABIs ordered.  Started Pletal.  Could be more vascular.  I do believe there is a neurogenic aspect as well likely some peripheral neuropathy.  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  Discussed B12 and B6 supplementation.  Patient adamant she did not want to try neuromodulator such as gabapentin at this time.  Depending on ABI hallux we will discuss further management.  Discussed over-the-counter orthotics and home exercises for the breakdown of the transverse arch.  Follow-up again in 4 to 6 weeks

## 2018-11-20 NOTE — Patient Instructions (Addendum)
Good to see you  Pletal 2 times daily to help with blood flow. PLEASE START WITH HALF TAB 2 TIMES DAILY  COmpression socks can help  Continue what you are doing.  Keep moving the feet.  [employer] orthotics "total support" online would be great  We will get test to rule out blood flow problems See me again in 4-6 weeks

## 2018-11-24 ENCOUNTER — Telehealth: Payer: Self-pay | Admitting: Internal Medicine

## 2018-11-24 NOTE — Telephone Encounter (Signed)
Copied from [place] [id number]. Topic: Quick Communication - See Telephone Encounter >> Nov 24, 2018  1:02 PM Antonieta Iba C wrote: CRM for notification. See Telephone encounter for: 11/24/18.  Pt called in to request her PCP to order her a colo guard.   [phone]

## 2018-11-24 NOTE — Telephone Encounter (Signed)
Routing to dr burns, please advise, thanks 

## 2018-11-25 NOTE — Telephone Encounter (Signed)
Please order

## 2018-12-03 ENCOUNTER — Other Ambulatory Visit: Payer: Self-pay | Admitting: Internal Medicine

## 2018-12-05 ENCOUNTER — Ambulatory Visit: Payer: Self-pay | Admitting: *Deleted

## 2018-12-05 NOTE — Telephone Encounter (Signed)
Order has been placed. Pt is aware. 

## 2018-12-05 NOTE — Telephone Encounter (Signed)
Patient fell 10 days ago- she reports she is having swelling in her leg so extreme it is shinning. Patient reports her pain level is an 8 when weight bearing on the leg and 5-6 when resting it. Patient reports her whole leg is bruised. Advised ED for evaluation.  Reason for Disposition . Sounds like a serious injury to the triager  Answer Assessment - Initial Assessment Questions 1. MECHANISM: "How did the injury happen?" (e.g., twisting injury, direct blow)      Fell stepping down to patio from inside the house 2. ONSET: "When did the injury happen?" (Minutes or hours ago)      10 days ago 3. LOCATION: "Where is the injury located?"      R foot all the way up to the knee 4. APPEARANCE of INJURY: "What does the injury look like?"  (e.g., deformity of leg)     Swelling all the way down 5. SEVERITY: "Can you put weight on that leg?" "Can you walk?"      Yes, yes 6. SIZE: For cuts, bruises, or swelling, ask: "How large is it?" (e.g., inches or centimeters)      Bruising all the way down the leg 7. PAIN: "Is there pain?" If so, ask: "How bad is the pain?"  (Scale 1-10; or mild, moderate, severe)     Some- knee and entire leg, 8 8. TETANUS: For any breaks in the skin, ask: "When was the last tetanus booster?"     no 9. OTHER SYMPTOMS: "Do you have any other symptoms?"      no 10. PREGNANCY: "Is there any chance you are pregnant?" "When was your last menstrual period?"       n/a  Protocols used: LEG INJURY-A-AH

## 2018-12-05 NOTE — Telephone Encounter (Signed)
Patient is calling to check on status of order.  Please follow up in regard.

## 2018-12-06 ENCOUNTER — Encounter (HOSPITAL_COMMUNITY): Payer: Self-pay

## 2018-12-06 NOTE — Telephone Encounter (Signed)
noted 

## 2018-12-07 ENCOUNTER — Other Ambulatory Visit: Payer: Self-pay

## 2018-12-07 ENCOUNTER — Emergency Department (HOSPITAL_BASED_OUTPATIENT_CLINIC_OR_DEPARTMENT_OTHER): Payer: Medicare Other

## 2018-12-07 ENCOUNTER — Encounter (HOSPITAL_COMMUNITY): Payer: Self-pay | Admitting: *Deleted

## 2018-12-07 ENCOUNTER — Emergency Department (HOSPITAL_COMMUNITY)
Admission: EM | Admit: 2018-12-07 | Discharge: 2018-12-07 | Disposition: A | Discharge status: Home / Self Care | Payer: Medicare Other | Attending: Emergency Medicine | Admitting: Emergency Medicine

## 2018-12-07 ENCOUNTER — Emergency Department (HOSPITAL_COMMUNITY): Payer: Medicare Other

## 2018-12-07 DIAGNOSIS — I10 Essential (primary) hypertension: Secondary | ICD-10-CM | POA: Insufficient documentation

## 2018-12-07 DIAGNOSIS — L0390 Cellulitis, unspecified: Secondary | ICD-10-CM

## 2018-12-07 DIAGNOSIS — Z79899 Other long term (current) drug therapy: Secondary | ICD-10-CM | POA: Diagnosis not present

## 2018-12-07 DIAGNOSIS — Y999 Unspecified external cause status: Secondary | ICD-10-CM | POA: Insufficient documentation

## 2018-12-07 DIAGNOSIS — M79671 Pain in right foot: Secondary | ICD-10-CM | POA: Diagnosis not present

## 2018-12-07 DIAGNOSIS — M79609 Pain in unspecified limb: Secondary | ICD-10-CM

## 2018-12-07 DIAGNOSIS — Z87891 Personal history of nicotine dependence: Secondary | ICD-10-CM | POA: Insufficient documentation

## 2018-12-07 DIAGNOSIS — S99922A Unspecified injury of left foot, initial encounter: Secondary | ICD-10-CM | POA: Diagnosis present

## 2018-12-07 DIAGNOSIS — Y9301 Activity, walking, marching and hiking: Secondary | ICD-10-CM | POA: Insufficient documentation

## 2018-12-07 DIAGNOSIS — Y92009 Unspecified place in unspecified non-institutional (private) residence as the place of occurrence of the external cause: Secondary | ICD-10-CM | POA: Insufficient documentation

## 2018-12-07 DIAGNOSIS — M7989 Other specified soft tissue disorders: Secondary | ICD-10-CM

## 2018-12-07 DIAGNOSIS — W109XXA Fall (on) (from) unspecified stairs and steps, initial encounter: Secondary | ICD-10-CM | POA: Insufficient documentation

## 2018-12-07 DIAGNOSIS — L03115 Cellulitis of right lower limb: Secondary | ICD-10-CM | POA: Diagnosis not present

## 2018-12-07 DIAGNOSIS — M79604 Pain in right leg: Secondary | ICD-10-CM

## 2018-12-07 DIAGNOSIS — S92515A Nondisplaced fracture of proximal phalanx of left lesser toe(s), initial encounter for closed fracture: Secondary | ICD-10-CM | POA: Diagnosis not present

## 2018-12-07 DIAGNOSIS — E039 Hypothyroidism, unspecified: Secondary | ICD-10-CM | POA: Insufficient documentation

## 2018-12-07 DIAGNOSIS — R2241 Localized swelling, mass and lump, right lower limb: Secondary | ICD-10-CM | POA: Diagnosis not present

## 2018-12-07 DIAGNOSIS — Z96652 Presence of left artificial knee joint: Secondary | ICD-10-CM | POA: Insufficient documentation

## 2018-12-07 DIAGNOSIS — S8992XA Unspecified injury of left lower leg, initial encounter: Secondary | ICD-10-CM | POA: Diagnosis not present

## 2018-12-07 DIAGNOSIS — M25561 Pain in right knee: Secondary | ICD-10-CM | POA: Diagnosis not present

## 2018-12-07 DIAGNOSIS — R609 Edema, unspecified: Secondary | ICD-10-CM | POA: Diagnosis not present

## 2018-12-07 DIAGNOSIS — M79605 Pain in left leg: Secondary | ICD-10-CM

## 2018-12-07 DIAGNOSIS — S92512A Displaced fracture of proximal phalanx of left lesser toe(s), initial encounter for closed fracture: Secondary | ICD-10-CM | POA: Diagnosis not present

## 2018-12-07 DIAGNOSIS — S99921A Unspecified injury of right foot, initial encounter: Secondary | ICD-10-CM | POA: Diagnosis not present

## 2018-12-07 LAB — CBC WITH DIFFERENTIAL/PLATELET
Abs Immature Granulocytes: 0.04 10*3/uL (ref 0.00–0.07)
Basophils Absolute: 0 10*3/uL (ref 0.0–0.1)
Basophils Relative: 0 %
EOS PCT: 5 %
Eosinophils Absolute: 0.2 10*3/uL (ref 0.0–0.5)
HCT: 39 % (ref 36.0–46.0)
Hemoglobin: 12.2 g/dL (ref 12.0–15.0)
Immature Granulocytes: 1 %
Lymphocytes Relative: 32 %
Lymphs Abs: 1.5 10*3/uL (ref 0.7–4.0)
MCH: 30.7 pg (ref 26.0–34.0)
MCHC: 31.3 g/dL (ref 30.0–36.0)
MCV: 98 fL (ref 80.0–100.0)
Monocytes Absolute: 0.5 10*3/uL (ref 0.1–1.0)
Monocytes Relative: 11 %
Neutro Abs: 2.5 10*3/uL (ref 1.7–7.7)
Neutrophils Relative %: 51 %
PLATELETS: 204 10*3/uL (ref 150–400)
RBC: 3.98 MIL/uL (ref 3.87–5.11)
RDW: 12.5 % (ref 11.5–15.5)
WBC: 4.8 10*3/uL (ref 4.0–10.5)
nRBC: 0 % (ref 0.0–0.2)

## 2018-12-07 LAB — COMPREHENSIVE METABOLIC PANEL
ALT: 16 U/L (ref 0–44)
ANION GAP: 4 — AB (ref 5–15)
AST: 18 U/L (ref 15–41)
Albumin: 3.8 g/dL (ref 3.5–5.0)
Alkaline Phosphatase: 47 U/L (ref 38–126)
BUN: 15 mg/dL (ref 8–23)
CO2: 30 mmol/L (ref 22–32)
Calcium: 8.7 mg/dL — ABNORMAL LOW (ref 8.9–10.3)
Chloride: 106 mmol/L (ref 98–111)
Creatinine, Ser: 0.91 mg/dL (ref 0.44–1.00)
GFR calc Af Amer: >60 mL/min (ref >60)
GFR calc non Af Amer: >60 mL/min (ref >60)
Glucose, Bld: 90 mg/dL (ref 70–99)
Potassium: 3.7 mmol/L (ref 3.5–5.1)
Sodium: 140 mmol/L (ref 135–145)
Total Bilirubin: 0.8 mg/dL (ref 0.3–1.2)
Total Protein: 6.3 g/dL — ABNORMAL LOW (ref 6.5–8.1)

## 2018-12-07 MED ORDER — DOXYCYCLINE HYCLATE 100 MG PO CAPS: 100.0000 mg | ORAL_CAPSULE | Freq: Two times a day (BID) | ORAL | 0 refills | Status: DC

## 2018-12-07 MED ORDER — FENTANYL CITRATE (PF) 100 MCG/2ML IJ SOLN
25.0000 ug | Freq: Once | INTRAMUSCULAR | Status: AC
  Administered 2018-12-07: 25 ug via INTRAVENOUS
  Filled 2018-12-07: qty 2

## 2018-12-07 MED ORDER — ONDANSETRON HCL 4 MG/2ML IJ SOLN
4.0000 mg | Freq: Once | INTRAMUSCULAR | Status: AC
  Administered 2018-12-07: 4 mg via INTRAVENOUS
  Filled 2018-12-07: qty 2

## 2018-12-07 NOTE — ED Notes (Signed)
US at bedside

## 2018-12-07 NOTE — Discharge Instructions (Signed)

## 2018-12-07 NOTE — ED Notes (Signed)
Pt to XR at this time

## 2018-12-07 NOTE — Progress Notes (Signed)
Right lower extremity venous duplex exam completed. Please see preliminary notes on CV PROC under chart review. Aashi Derrington H Shali Vesey(RDMS RVT) 12/07/18 11:47 AM

## 2018-12-07 NOTE — ED Notes (Signed)
Pt discharged with husband at this time. Pt is alert and oriented at this time and has no complaints at this time.

## 2018-12-07 NOTE — ED Triage Notes (Signed)
Pt complains of bilateral lower leg pain since fall 10 days ago. Pt states her feet became bruised a few days after the fall.

## 2018-12-07 NOTE — ED Provider Notes (Signed)
[facility] DEPT Provider Note   CSN: [id number] Arrival date & time: 12/07/18  4193    History   Chief Complaint Chief Complaint  Patient presents with  . Leg Pain    HPI Dawn Kim is a 73 y.o. female with a past medical history of anxiety, depression, hypertension, frequent falls, chronic back pain, chronic left knee pain status post TKR.  The patient presents for evaluation of bilateral lower extremity pain after fall.  Patient states that she was carrying a plant on her back steps when she lost her balance and fell forward onto the ground onto both knees.  Since that time she has had significant difficulty walking, bruising in her lower extremities and feet.  Her pain has worsened over the past 10 days.  She states she is barely able to ambulate.  She complains of severe pain in her right lower extremity more than the left.  She complains of knee pain, right leg swelling and foot pain.  Patient did not hit her head or lose consciousness.  She does not take any blood thinners.  She denies any upper extremity pain she describes the pain as aching, throbbing.  Worse with movement of the knees and ankles and bearing weight.  Better with rest but constant.  Has any new paresthesia or weakness she has not taken anything to relieve her symptoms     HPI  Past Medical History:  Diagnosis Date  . Anxiety   . Cataract    Bil  . Depression   . Hypercholesteremia   . Hypertension   . Thyroid disease     Patient Active Problem List   Diagnosis Date Noted  . 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 Confusion 06/06/2018  . Frequent falls 06/06/2018  . AKI (acute kidney injury) ([place]) 06/06/2018  . T12 compression fracture ([place]) 03/28/2018  . Midline low back pain without sciatica 03/21/2018  . Head trauma 03/21/2018  . Shortness of breath 01/06/2018  . Encounter for pain management 10/05/2017  .  Chronic lower back pain 09/19/2017  . Pain due to total left knee replacement ([place]) 12/09/2016  . Osteopenia 06/21/2016  . Hypothyroidism 10/22/2015  . Depression 10/22/2015  . Anxiety 10/22/2015  . GERD (gastroesophageal reflux disease) 10/22/2015  . Insomnia 10/22/2015  . Bilateral foot pain 10/22/2015  . Bilateral knee pain 10/22/2015  . HTN (hypertension) 01/20/2012  . Lipid disorder 01/20/2012    Past Surgical History:  Procedure Laterality Date  . CHOLECYSTECTOMY    . COLONOSCOPY    . FOOT SURGERY  2011   right foot  . JOINT REPLACEMENT  2010   left knee  . POLYPECTOMY    . REPLACEMENT TOTAL KNEE Left   . TONSILLECTOMY       OB History   No obstetric history on file.      Home Medications    Prior to Admission medications   Medication Sig Start Date End Date Taking? Authorizing Provider  ALPRAZolam Duanne Moron) 0.5 MG tablet Take 1 tablet (0.5 mg total) by mouth 3 (three) times daily as needed. for anxiety 11/16/18   Binnie Rail, MD  calcium carbonate (OS-CAL - DOSED IN MG OF ELEMENTAL CALCIUM) 1250 (500 Ca) MG tablet Take 1 tablet (500 mg of elemental calcium total) by mouth daily. 05/31/16   Nche, Charlene Brooke, NP  cholecalciferol (VITAMIN D) 1000 UNITS tablet Take 1,000 Units by mouth daily.  [provider]  cilostazol (PLETAL) 50 MG tablet Take 0.5 tablets (25 mg total) by mouth 2 (two) times daily. 11/20/18   Lyndal Pulley, DO  diclofenac sodium (VOLTAREN) 1 % GEL Apply 4 g topically 4 (four) times daily. 07/26/18   Binnie Rail, MD  famotidine (PEPCID) 40 MG tablet TAKE 1 TABLET BY MOUTH AT BEDTIME 12/04/18   Burns, Claudina Lick, MD  ferrous sulfate 325 (65 FE) MG EC tablet Take 325 mg by mouth daily with breakfast.    [provider]  fish oil-omega-3 fatty acids 1000 MG capsule Take 1 g by mouth 2 (two) times daily.     [provider]  levocetirizine (XYZAL) 5 MG tablet Take 1 tablet (5 mg total) by mouth every evening. 07/26/18    Binnie Rail, MD  levothyroxine (SYNTHROID, LEVOTHROID) 112 MCG tablet TAKE 1 TABLET BY MOUTH ONCE DAILY 10/09/18   Binnie Rail, MD  Multiple Vitamin (MULTIVITAMIN) tablet Take 1 tablet by mouth daily.    [provider]  omeprazole (PRILOSEC) 40 MG capsule Take 1 capsule (40 mg total) by mouth 2 (two) times daily before a meal. 03/24/18   Burns, Claudina Lick, MD  QUEtiapine (SEROQUEL) 50 MG tablet Take 1 tablet (50 mg total) by mouth at bedtime. 09/08/18   Binnie Rail, MD  ramipril (ALTACE) 10 MG capsule Take 1 capsule (10 mg total) by mouth daily. 10/06/18   Binnie Rail, MD  sertraline (ZOLOFT) 50 MG tablet Take 3 tablets (150 mg total) by mouth at bedtime. 10/06/18   Binnie Rail, MD  simvastatin (ZOCOR) 40 MG tablet Take 1 tablet by mouth in the evening 12/04/18   Binnie Rail, MD    Family History Family History  Problem Relation Age of Onset  . Heart disease Mother   . Drug abuse Sister     Social History Social History   Tobacco Use  . Smoking status: Former Smoker    Types: Cigarettes    Last attempt to quit: 11/04/2009    Years since quitting: 9.0  . Smokeless tobacco: Never Used  Substance Use Topics  . Alcohol use: No  . Drug use: No     Allergies   Gabapentin; Lunesta [eszopiclone]; Penicillins; and Amoxicillin   Review of Systems Review of Systems Ten systems reviewed and are negative for acute change, except as noted in the HPI.    Physical Exam Updated Vital Signs BP (!) 194/110   Pulse 76   Temp 97.6 F (36.4 C) (Oral)   Resp 18   SpO2 100%   Physical Exam Vitals signs and nursing note reviewed.  Constitutional:      General: She is not in acute distress.    Appearance: She is well-developed. She is not diaphoretic.  HENT:     Head: Normocephalic and atraumatic.  Eyes:     General: No scleral icterus.    Conjunctiva/sclera: Conjunctivae normal.  Neck:     Musculoskeletal: Normal range of motion.  Cardiovascular:     Rate and  Rhythm: Normal rate and regular rhythm.     Heart sounds: Normal heart sounds. No murmur. No friction rub. No gallop.   Pulmonary:     Effort: Pulmonary effort is normal. No respiratory distress.     Breath sounds: Normal breath sounds.  Abdominal:     General: Bowel sounds are normal. There is no distension.     Palpations: Abdomen is soft. There is no mass.  Tenderness: There is no abdominal tenderness. There is no guarding.  Musculoskeletal:     Comments: Scab over the right knee, tenderness swelling and erythema, mild warmth.  Right greater than left calf swelling and tenderness.  Bruising in the bilateral feet.  Left joint line tenderness over the replaced knee.  Well-healed midline surgical scar of the knee.  Able to bend both knees.  Skin:    General: Skin is warm and dry.  Neurological:     Mental Status: She is alert and oriented to person, place, and time.  Psychiatric:        Behavior: Behavior normal.      ED Treatments / Results  Labs (all labs ordered are listed, but only abnormal results are displayed) Labs Reviewed  CBC WITH DIFFERENTIAL/PLATELET  COMPREHENSIVE METABOLIC PANEL    EKG None  Radiology No results found.  Procedures Procedures (including critical care time)  Medications Ordered in ED Medications - No data to display   Initial Impression / Assessment and Plan / ED Course  I have reviewed the triage vital signs and the nursing notes.  Pertinent labs & imaging results that were available during my care of the patient were reviewed by me and considered in my medical decision making (see chart for details).        73 year old female with fall.  Suspected cellulitis over the right knee.  Clinically the patient does not appear to have a septic joint as she is able to flex her joints easily.  Review of the imaging shows an acute fracture of the left fifth digit.  Patient is ambulatory.  She was seen and shared visit with Dr. Wilson Singer.  I  personally reviewed all the images ordered and agree with radiologic and taped her rotation.  Patient will be discharged with treatment for cellulitis.  She may follow closely with her primary care and sports medicine physicians.  She does not appear to have any other acute abnormalities today.  She should have a wound follow-up in 2 days.  Discussed return precautions.  Labs reviewed and show no acute abnormalities, no elevated white blood cell count.  She is hemodynamically stable.  Her ultrasound shows no evidence of thrombus Final Clinical Impressions(s) / ED Diagnoses   Final diagnoses:  None    ED Discharge Orders    None       Margarita Mail, PA-C 12/07/18 1644    Virgel Manifold, MD 12/08/18 1040

## 2018-12-11 ENCOUNTER — Ambulatory Visit (HOSPITAL_COMMUNITY)
Admission: RE | Admit: 2018-12-11 | Discharge: 2018-12-11 | Disposition: A | Discharge status: Home / Self Care | Payer: Medicare Other | Source: Ambulatory Visit | Attending: Cardiovascular Disease | Admitting: Cardiovascular Disease

## 2018-12-11 ENCOUNTER — Telehealth: Payer: Self-pay | Admitting: Internal Medicine

## 2018-12-11 DIAGNOSIS — M79604 Pain in right leg: Secondary | ICD-10-CM | POA: Diagnosis not present

## 2018-12-11 DIAGNOSIS — M79605 Pain in left leg: Secondary | ICD-10-CM | POA: Diagnosis not present

## 2018-12-11 DIAGNOSIS — M79672 Pain in left foot: Secondary | ICD-10-CM | POA: Diagnosis not present

## 2018-12-11 DIAGNOSIS — M79671 Pain in right foot: Secondary | ICD-10-CM | POA: Diagnosis not present

## 2018-12-11 NOTE — Telephone Encounter (Signed)
Copied from [place] [id number]. Topic: Quick Communication - See Telephone Encounter >> Dec 11, 2018  9:46 AM Nils Flack wrote: CRM for notification. See Telephone encounter for: 12/11/18. Pr was seen in the ed for broken toe and cellulitis.  Pt declined to schedule follow up for pcp since she has appts set up in the future and has appt for vascular scan.  Pt is taking doxycycline and would like to have call to see what she needs to do next. Please call [phone]

## 2018-12-12 ENCOUNTER — Other Ambulatory Visit: Payer: Self-pay | Admitting: Internal Medicine

## 2018-12-12 MED ORDER — ALPRAZOLAM 0.5 MG PO TABS: 0.5000 mg | ORAL_TABLET | Freq: Three times a day (TID) | ORAL | 0 refills | Status: DC | PRN

## 2018-12-12 NOTE — Telephone Encounter (Signed)
Copied from [place] [id number]. Topic: Quick Communication - Rx Refill/Question >> Dec 12, 2018 12:59 PM Bea Graff, NT wrote: Medication: ALPRAZolam Duanne Moron) 0.5 MG tablet   Has the patient contacted their pharmacy? Yes.   (Agent: If no, request that the patient contact the pharmacy for the refill.) (Agent: If yes, when and what did the pharmacy advise?)  Preferred Pharmacy (with phone number or street name): [facility], [address] [phone] (Phone) [phone] (Fax)    Agent: Please be advised that RX refills may take up to 3 business days. We ask that you follow-up with your pharmacy.

## 2018-12-12 NOTE — Telephone Encounter (Signed)
Last refill was 09/16/19 Last OV 10/06/18 Next OV 01/10/19

## 2018-12-13 NOTE — Telephone Encounter (Signed)
Spoke with patient. Per a verbal from Dr. Tamala Julian patient should continue course of doxycycline and should keep her appointment on Monday with [place]. Patient voices understanding.

## 2018-12-16 NOTE — Progress Notes (Deleted)
[name] [place] [address] [place], [place] [address] Phone: [phone] Subjective:    I'm seeing this patient by the request  of:    CC: Leg pain and back pain follow-up  TWS:[id number]  Dawn Kim is a 73 y.o. female coming in with complaint of bilateral leg pain.  Foot exam showed the patient did have a bluish hue to her legs.  1+ distal pulses of the dorsalis pedis previously with breakdown of the transverse arch.  Also had what appeared to be peripheral neuropathy.  Patient was started on low-dose of Pletal as well as encouraged to do compression socks, over-the-counter orthotics   Patient since we have seen him on 10 fall.  Had bruising in her leg.  Went to the emergency room.  Diagnosed with a cellulitis with patient having regular CBCs, afebrile and nontoxic.  Doppler was negative for DVT.  Patient was put on doxycycline  Past Medical History:  Diagnosis Date  . Anxiety   . Cataract    Bil  . Depression   . Hypercholesteremia   . Hypertension   . Thyroid disease    Past Surgical History:  Procedure Laterality Date  . CHOLECYSTECTOMY    . COLONOSCOPY    . FOOT SURGERY  2011   right foot  . JOINT REPLACEMENT  2010   left knee  . POLYPECTOMY    . REPLACEMENT TOTAL KNEE Left   . TONSILLECTOMY     Social History   Socioeconomic History  . Marital status: Married    Spouse name: Not on file  . Number of children: 3  . Years of education: 61  . Highest education level: Not on file  Occupational History  . Occupation: Retired   Scientific laboratory technician  . Financial resource strain: Not very hard  . Food insecurity:    Worry: Never true    Inability: Never true  . Transportation needs:    Medical: No    Non-medical: No  Tobacco Use  . Smoking status: Former Smoker    Types: Cigarettes    Last attempt to quit: 11/04/2009    Years since quitting: 9.1  . Smokeless tobacco: Never Used  Substance and Sexual Activity  . Alcohol use: No  .  Drug use: No  . Sexual activity: Never  Lifestyle  . Physical activity:    Days per week: 0 days    Minutes per session: 0 min  . Stress: To some extent  Relationships  . Social connections:    Talks on phone: Twice a week    Gets together: Twice a week    Attends religious service: 1 to 4 times per year    Active member of club or organization: Yes    Attends meetings of clubs or organizations: 1 to 4 times per year    Relationship status: Married  Other Topics Concern  . Not on file  Social History Narrative   Cares for 2yo husband; has daughter locally who can help if needed   Allergies  Allergen Reactions  . Gabapentin     Caused parkinsonism  . Lunesta [Eszopiclone]     Ataxia, confusion  . Penicillins Hives    Has patient had a PCN reaction causing immediate rash, facial/tongue/throat swelling, SOB or lightheadedness with hypotension: No Has patient had a PCN reaction causing severe rash involving mucus membranes or skin necrosis: Yes  Has patient had a PCN reaction that required hospitalization: No Has patient had a  PCN reaction occurring within the last 10 years: No  If all of the above answers are "NO", then may proceed with Cephalosporin use.   [name] Amoxicillin Rash    Has patient had a PCN reaction causing immediate rash, facial/tongue/throat swelling, SOB or lightheadedness with hypotension: No  Has patient had a PCN reaction causing severe rash involving mucus membranes or skin necrosis: Yes Has patient had a PCN reaction that required hospitalization No Has patient had a PCN reaction occurring within the last 10 years: No If all of the above answers are "NO", then may proceed with Cephalosporin use.    Family History  Problem Relation Age of Onset  . Heart disease Mother   . Drug abuse Sister     Current Outpatient Medications (Endocrine & Metabolic):  .  levothyroxine (SYNTHROID, LEVOTHROID) 112 MCG tablet, TAKE 1 TABLET BY MOUTH ONCE DAILY  Current  Outpatient Medications (Cardiovascular):  .  ramipril (ALTACE) 10 MG capsule, Take 1 capsule (10 mg total) by mouth daily. .  simvastatin (ZOCOR) 40 MG tablet, Take 1 tablet by mouth in the evening  Current Outpatient Medications (Respiratory):  .  levocetirizine (XYZAL) 5 MG tablet, Take 1 tablet (5 mg total) by mouth every evening.   Current Outpatient Medications (Hematological):  .  cilostazol (PLETAL) 50 MG tablet, Take 0.5 tablets (25 mg total) by mouth 2 (two) times daily. .  ferrous sulfate 325 (65 FE) MG EC tablet, Take 325 mg by mouth daily with breakfast.  Current Outpatient Medications (Other):  [name]  ALPRAZolam (XANAX) 0.5 MG tablet, Take 1 tablet (0.5 mg total) by mouth 3 (three) times daily as needed. for anxiety .  calcium carbonate (OS-CAL - DOSED IN MG OF ELEMENTAL CALCIUM) 1250 (500 Ca) MG tablet, Take 1 tablet (500 mg of elemental calcium total) by mouth daily. .  cholecalciferol (VITAMIN D) 1000 UNITS tablet, Take 1,000 Units by mouth daily. .  diclofenac sodium (VOLTAREN) 1 % GEL, Apply 4 g topically 4 (four) times daily. [name]  doxycycline (VIBRAMYCIN) 100 MG capsule, Take 1 capsule (100 mg total) by mouth 2 (two) times daily. One po bid x 7 days .  famotidine (PEPCID) 40 MG tablet, TAKE 1 TABLET BY MOUTH AT BEDTIME .  fish oil-omega-3 fatty acids 1000 MG capsule, Take 1 g by mouth 2 (two) times daily.  .  Multiple Vitamin (MULTIVITAMIN) tablet, Take 1 tablet by mouth daily. [name]  omeprazole (PRILOSEC) 40 MG capsule, Take 1 capsule (40 mg total) by mouth 2 (two) times daily before a meal. .  QUEtiapine (SEROQUEL) 50 MG tablet, Take 1 tablet (50 mg total) by mouth at bedtime. .  sertraline (ZOLOFT) 50 MG tablet, Take 3 tablets (150 mg total) by mouth at bedtime.    Past medical history, social, surgical and family history all reviewed in electronic medical record.  No pertanent information unless stated regarding to the chief complaint.   Review of Systems:  No headache,  visual changes, nausea, vomiting, diarrhea, constipation, dizziness, abdominal pain, skin rash, fevers, chills, night sweats, weight loss, swollen lymph nodes, body aches, joint swelling, muscle aches, chest pain, shortness of breath, mood changes.   Objective  There were no vitals taken for this visit. Systems examined below as of    General: No apparent distress alert and oriented x3 mood and affect normal, dressed appropriately.  HEENT: Pupils equal, extraocular movements intact  Respiratory: Patient's speak in full sentences and does not appear short of breath  Cardiovascular: No lower extremity  edema, non tender, no erythema  Skin: Warm dry intact with no signs of infection or rash on extremities or on axial skeleton.  Abdomen: Soft nontender  Neuro: Cranial nerves II through XII are intact, neurovascularly intact in all extremities with 2+ DTRs and 2+ pulses.  Lymph: No lymphadenopathy of posterior or anterior cervical chain or axillae bilaterally.  Gait normal with good balance and coordination.  MSK:  Non tender with full range of motion and good stability and symmetric strength and tone of shoulders, elbows, wrist, hip, knee and ankles bilaterally.     Impression and Recommendations:     This case required medical decision making of moderate complexity. The above documentation has been reviewed and is accurate and complete Lyndal Pulley, DO       Note: This dictation was prepared with Dragon dictation along with smaller phrase technology. Any transcriptional errors that result from this process are unintentional.

## 2018-12-18 ENCOUNTER — Ambulatory Visit: Payer: Self-pay | Admitting: Family Medicine

## 2019-01-01 NOTE — Telephone Encounter (Addendum)
Pt called noting she has received 2 letters from cologuard they need additional information from Dr. Quay Burow.   Pt asked if ALPRAZolam (XANAX) 0.5 MG tablet can be sent in. She knows it is a little early and not due until 4/9-4/10. She said she asked for increase to 4/day and was advised it was not approved.   [place], [place] [phone] (Phone) [phone] (Fax)      Please advise. Call back [phone]

## 2019-01-03 NOTE — Telephone Encounter (Signed)
Spoke with pt and advised she is not due for cologuard right now. Also her xanax will not be filled until its due.

## 2019-01-04 ENCOUNTER — Other Ambulatory Visit: Payer: Self-pay | Admitting: Internal Medicine

## 2019-01-04 ENCOUNTER — Telehealth: Payer: Self-pay | Admitting: Internal Medicine

## 2019-01-04 NOTE — Telephone Encounter (Signed)
[place] Controlled Database Checked Last filled: 12/13/18 # 90 LOV w/you: 10/06/18 Next appt w/you: 01/10/19

## 2019-01-04 NOTE — Telephone Encounter (Signed)
I called pt- she is sleeping now. A man who answered had to wake her. Pt informed of below. She states she takes the Seroquel as prescribed, but she still isn't sleeping at night. I also advised her the Alprazolam 0.5 mg was refilled today as well. She thanked me for calling and verbalized understanding.

## 2019-01-04 NOTE — Telephone Encounter (Signed)
She is already on a sleep medication - seroquel

## 2019-01-04 NOTE — Telephone Encounter (Signed)
Pt called and wanted to know if she can have something to help her sleep , she said that she having a lot trouble going to sleep. Please advise.

## 2019-01-05 ENCOUNTER — Other Ambulatory Visit: Payer: Self-pay | Admitting: Internal Medicine

## 2019-01-10 ENCOUNTER — Ambulatory Visit: Payer: Self-pay | Admitting: Internal Medicine

## 2019-01-16 ENCOUNTER — Other Ambulatory Visit: Payer: Self-pay | Admitting: Internal Medicine

## 2019-01-22 ENCOUNTER — Ambulatory Visit: Payer: Self-pay | Admitting: *Deleted

## 2019-01-22 NOTE — Telephone Encounter (Signed)
Pt left message on COVID voicemail 01/19/2019 at [facility]; she stated that she wanted to know where to go to get tested; contacted pt regarding her request; she says that she is not having symptoms; explained to pt that no routine testing done she should contact her PCP if she had symptoms; she verbalized understanding; pt normally seen by Dr Billey Gosling, [facility]; will route to office for notification.  Reason for Disposition . COVID-19 Testing, questions about  Protocols used: CORONAVIRUS (COVID-19) DIAGNOSED OR SUSPECTED-A-AH

## 2019-01-22 NOTE — Telephone Encounter (Signed)
I followed up with patient.  She states she does not have symptoms.  States she only wanted to know if there were testing sites for those who were curious about testing.  Patient states she did not need an appointment.  I have informed patient to call us back for a V appointment if she does develop symptoms.

## 2019-01-22 NOTE — Telephone Encounter (Signed)
Noted  

## 2019-02-05 DIAGNOSIS — Z124 Encounter for screening for malignant neoplasm of cervix: Secondary | ICD-10-CM | POA: Diagnosis not present

## 2019-02-05 DIAGNOSIS — Z01419 Encounter for gynecological examination (general) (routine) without abnormal findings: Secondary | ICD-10-CM | POA: Diagnosis not present

## 2019-02-05 DIAGNOSIS — Z6836 Body mass index (BMI) 36.0-36.9, adult: Secondary | ICD-10-CM | POA: Diagnosis not present

## 2019-02-05 DIAGNOSIS — Z1231 Encounter for screening mammogram for malignant neoplasm of breast: Secondary | ICD-10-CM | POA: Diagnosis not present

## 2019-02-05 DIAGNOSIS — N958 Other specified menopausal and perimenopausal disorders: Secondary | ICD-10-CM | POA: Diagnosis not present

## 2019-02-06 ENCOUNTER — Telehealth: Payer: Self-pay | Admitting: Internal Medicine

## 2019-02-06 MED ORDER — ALPRAZOLAM 0.5 MG PO TABS: 0.5000 mg | ORAL_TABLET | Freq: Three times a day (TID) | ORAL | 0 refills | Status: DC | PRN

## 2019-02-06 NOTE — Telephone Encounter (Signed)
Patient is overdue for a follow up. I called to let her know and tried to see if she could do a virtual follow up which she states she can not do. Please advise.   Last refill was 01/09/19 Last routine OV was 10/06/18 due to come back in 3 months

## 2019-02-06 NOTE — Telephone Encounter (Signed)
Copied from [place] [id number]. Topic: Quick Communication - See Telephone Encounter >> Feb 06, 2019  8:20 AM [place] wrote: CRM for notification. See Telephone encounter for: 02/06/19.ALPRAZolam Duanne Moron) 0.5 MG tablet  [place], [place] [phone] (Phone) [phone] (Fax) Refill

## 2019-02-06 NOTE — Telephone Encounter (Signed)
Ok to reschedule f/u for June.  rx sent

## 2019-02-07 NOTE — Telephone Encounter (Signed)
Appointment scheduled.

## 2019-02-07 NOTE — Telephone Encounter (Signed)
Pt called and was advised about refill and that she needed to schedule appt for June with Dr. Quay Burow. No answer on FC line to do so. Please return call to pt to schedule.

## 2019-02-15 ENCOUNTER — Other Ambulatory Visit: Payer: Self-pay | Admitting: Internal Medicine

## 2019-02-21 ENCOUNTER — Ambulatory Visit: Payer: Self-pay | Admitting: Internal Medicine

## 2019-02-21 NOTE — Telephone Encounter (Signed)
Called into the COVID line wanting to know if they do testing for the COVID-19 virus in Dr. Eilleen Kempf office.   I let her know the office is not doing testing there.  She thanked me for my help and ended call before I could ask her any questions.

## 2019-02-26 ENCOUNTER — Other Ambulatory Visit: Payer: Self-pay | Admitting: Internal Medicine

## 2019-02-27 ENCOUNTER — Telehealth: Payer: Self-pay | Admitting: Internal Medicine

## 2019-02-27 NOTE — Telephone Encounter (Signed)
Would you do a phone call with her tomorrow or do you prefer to talk to her today?

## 2019-02-27 NOTE — Telephone Encounter (Signed)
Can she do a virtual?  If so virtual -- if she can not do a virtual then phone call - can do at 4:30 today

## 2019-02-27 NOTE — Telephone Encounter (Signed)
Spoke with pt and she stated that she wanted to come in office and did not want to do a phone call. She said she just thought everyone had to be tested for COVID. She thinks she has fibromyalgia and will discuss at her follow up visit.

## 2019-02-27 NOTE — Telephone Encounter (Signed)
Patient called [facility] 02/24/19 at 12:14pm.  Patient states she wants to know where to go?  States she needs to be "tested".  Pt has body aches.  [facility] tried to reach patient back but was not successful.

## 2019-03-03 NOTE — Progress Notes (Signed)
Subjective:    Patient ID: Dawn Kim, female    DOB: [birth year], 73 y.o.   MRN: [id number]  HPI The patient is here for follow up.  Hypertension: She is taking her medication daily. She is compliant with a low sodium diet.  She denies chest pain, palpitations, edema,  and regular headaches. She does not monitor her blood pressure at home.    Hypothyroidism:  She is taking her medication daily.  She denies any recent changes in energy or weight that are unexplained.   She is fatigued, which is not new - it is from not sleeping.    Hyperlipidemia: She is taking her medication daily. She is compliant with a low fat/cholesterol diet. She denies myalgias.   GERD:  She is taking her medication daily as prescribed.  She denies any GERD symptoms and feels her GERD is well controlled.   Anxiety: She is taking her medication daily as prescribed. She denies any side effects from the medication. She feels her anxiety is not well controlled and has always wanted to increase the alprazolam.  She is taking the sertraline on a daily basis.  Insomnia:  She takes seroquel nightly.  She does not sleep well despite the medication.    Chronic knee pain:  She is taking tylenol.  She uses diclofenac gel.  Tylenol does not help much with her pain.  She wonders about getting something stronger.   Medications and allergies reviewed with patient and updated if appropriate.  Patient Active Problem List   Diagnosis Date Noted  . 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 Confusion 06/06/2018  . Frequent falls 06/06/2018  . T12 compression fracture ([place]) 03/28/2018  . Midline low back pain without sciatica 03/21/2018  . Head trauma 03/21/2018  . Shortness of breath 01/06/2018  . Chronic lower back pain 09/19/2017  . 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 Osteopenia 06/21/2016  . Hypothyroidism 10/22/2015  . Depression 10/22/2015   . Anxiety 10/22/2015  . GERD (gastroesophageal reflux disease) 10/22/2015  . Insomnia 10/22/2015  . Bilateral foot pain 10/22/2015  . Bilateral knee pain 10/22/2015  . HTN (hypertension) 01/20/2012  . Lipid disorder 01/20/2012    Current Outpatient Medications on File Prior to Visit  Medication Sig Dispense Refill  . calcium carbonate (OS-CAL - DOSED IN MG OF ELEMENTAL CALCIUM) 1250 (500 Ca) MG tablet Take 1 tablet (500 mg of elemental calcium total) by mouth daily.    . cholecalciferol (VITAMIN D) 1000 UNITS tablet Take 1,000 Units by mouth daily.    . cilostazol (PLETAL) 50 MG tablet Take 0.5 tablets (25 mg total) by mouth 2 (two) times daily. 60 tablet 1  . diclofenac sodium (VOLTAREN) 1 % GEL Apply 4 g topically 4 (four) times daily. 100 g 5  . famotidine (PEPCID) 40 MG tablet TAKE 1 TABLET BY MOUTH AT BEDTIME 90 tablet 1  . ferrous sulfate 325 (65 FE) MG EC tablet Take 325 mg by mouth daily with breakfast.    . fish oil-omega-3 fatty acids 1000 MG capsule Take 1 g by mouth 2 (two) times daily.     [name] levocetirizine (XYZAL) 5 MG tablet TAKE 1 TABLET BY MOUTH ONCE DAILY IN THE EVENING 30 tablet 0  . levothyroxine (SYNTHROID, LEVOTHROID) 112 MCG tablet Take 1 tablet by mouth once daily 90 tablet 0  . Multiple Vitamin (MULTIVITAMIN) tablet Take 1 tablet by mouth daily.    [name]  omeprazole (PRILOSEC) 40 MG capsule Take 1 capsule (40 mg total) by mouth 2 (two) times daily before a meal. 180 capsule 3  . QUEtiapine (SEROQUEL) 50 MG tablet TAKE 1 TABLET BY MOUTH AT BEDTIME 30 tablet 0  . sertraline (ZOLOFT) 50 MG tablet Take 3 tablets (150 mg total) by mouth daily. Need office visit for more refills 90 tablet 0  . simvastatin (ZOCOR) 40 MG tablet Take 1 tablet by mouth in the evening 90 tablet 1   No current facility-administered medications on file prior to visit.     Past Medical History:  Diagnosis Date  . Anxiety   . Cataract    Bil  . Depression   . Hypercholesteremia   .  Hypertension   . Thyroid disease     Past Surgical History:  Procedure Laterality Date  . CHOLECYSTECTOMY    . COLONOSCOPY    . FOOT SURGERY  2011   right foot  . JOINT REPLACEMENT  2010   left knee  . POLYPECTOMY    . REPLACEMENT TOTAL KNEE Left   . TONSILLECTOMY      Social History   Socioeconomic History  . Marital status: Married    Spouse name: Not on file  . Number of children: 3  . Years of education: 85  . Highest education level: Not on file  Occupational History  . Occupation: Retired   Scientific laboratory technician  . Financial resource strain: Not very hard  . Food insecurity:    Worry: Never true    Inability: Never true  . Transportation needs:    Medical: No    Non-medical: No  Tobacco Use  . Smoking status: Former Smoker    Types: Cigarettes    Last attempt to quit: 11/04/2009    Years since quitting: 9.3  . Smokeless tobacco: Never Used  Substance and Sexual Activity  . Alcohol use: No  . Drug use: No  . Sexual activity: Never  Lifestyle  . Physical activity:    Days per week: 0 days    Minutes per session: 0 min  . Stress: To some extent  Relationships  . Social connections:    Talks on phone: Twice a week    Gets together: Twice a week    Attends religious service: 1 to 4 times per year    Active member of club or organization: Yes    Attends meetings of clubs or organizations: 1 to 4 times per year    Relationship status: Married  Other Topics Concern  . Not on file  Social History Narrative   Cares for 42yo husband; has daughter locally who can help if needed    Family History  Problem Relation Age of Onset  . Heart disease Mother   . Drug abuse Sister     Review of Systems  Constitutional: Negative for chills and fever.  HENT: Positive for sneezing (allergies).   Respiratory: Positive for shortness of breath (occ). Negative for cough and wheezing.   Cardiovascular: Negative for chest pain, palpitations and leg swelling.  Musculoskeletal:  Positive for arthralgias (knees).  Neurological: Positive for headaches. Negative for light-headedness.  Psychiatric/Behavioral: Positive for dysphoric mood. The patient is nervous/anxious.        Objective:   Vitals:   03/05/19 1306  BP: (!) 162/88  Pulse: 67  Resp: 16  Temp: 98.3 F (36.8 C)  SpO2: 98%   BP Readings from Last 3 Encounters:  03/05/19 (!) 162/88  12/07/18 (!) 159/66  11/20/18 140/90   Wt Readings from Last 3 Encounters:  03/05/19 193 lb 12.8 oz (87.9 kg)  11/20/18 194 lb (88 kg)  10/11/18 190 lb (86.2 kg)   Body mass index is 32.25 kg/m.   Physical Exam    Constitutional: Appears well-developed and well-nourished. No distress.  HENT:  Head: Normocephalic and atraumatic.  Neck: Neck supple. No tracheal deviation present. No thyromegaly present.  No cervical lymphadenopathy Cardiovascular: Normal rate, regular rhythm and normal heart sounds.   No murmur heard. No carotid bruit .  No edema Pulmonary/Chest: Effort normal and breath sounds normal. No respiratory distress. No has no wheezes. No rales.  Skin: Skin is warm and dry. Not diaphoretic.  Psychiatric: Anxious mood and affect. Behavior is normal.      Assessment & Plan:    See Problem List for Assessment and Plan of chronic medical problems.

## 2019-03-03 NOTE — Patient Instructions (Addendum)
  Tests ordered today. Your results will be released to [facility] (or called to you) after review, usually within 72hours after test completion. If any changes need to be made, you will be notified at that same time.   Medications reviewed and updated.  Changes include :   Increase the blood pressure medication, ramipril to 20 mg ( two pills ) daily.    Your prescription(s) have been submitted to your pharmacy. Please take as directed and contact our office if you believe you are having problem(s) with the medication(s).    Please followup in 6 months

## 2019-03-05 ENCOUNTER — Other Ambulatory Visit (INDEPENDENT_AMBULATORY_CARE_PROVIDER_SITE_OTHER): Payer: Medicare Other

## 2019-03-05 ENCOUNTER — Other Ambulatory Visit: Payer: Self-pay

## 2019-03-05 ENCOUNTER — Ambulatory Visit (INDEPENDENT_AMBULATORY_CARE_PROVIDER_SITE_OTHER): Payer: Medicare Other | Admitting: Internal Medicine

## 2019-03-05 ENCOUNTER — Encounter: Payer: Self-pay | Admitting: Internal Medicine

## 2019-03-05 VITALS — BP 162/88 | HR 67 | Temp 98.3°F | Resp 16 | Ht 65.0 in | Wt 193.8 lb

## 2019-03-05 DIAGNOSIS — N289 Disorder of kidney and ureter, unspecified: Secondary | ICD-10-CM

## 2019-03-05 DIAGNOSIS — E789 Disorder of lipoprotein metabolism, unspecified: Secondary | ICD-10-CM

## 2019-03-05 DIAGNOSIS — E038 Other specified hypothyroidism: Secondary | ICD-10-CM

## 2019-03-05 DIAGNOSIS — M25561 Pain in right knee: Secondary | ICD-10-CM | POA: Diagnosis not present

## 2019-03-05 DIAGNOSIS — G4700 Insomnia, unspecified: Secondary | ICD-10-CM

## 2019-03-05 DIAGNOSIS — I10 Essential (primary) hypertension: Secondary | ICD-10-CM

## 2019-03-05 DIAGNOSIS — F419 Anxiety disorder, unspecified: Secondary | ICD-10-CM

## 2019-03-05 DIAGNOSIS — K219 Gastro-esophageal reflux disease without esophagitis: Secondary | ICD-10-CM | POA: Diagnosis not present

## 2019-03-05 DIAGNOSIS — M25562 Pain in left knee: Secondary | ICD-10-CM

## 2019-03-05 DIAGNOSIS — G8929 Other chronic pain: Secondary | ICD-10-CM

## 2019-03-05 LAB — COMPREHENSIVE METABOLIC PANEL
ALT: 12 U/L (ref 0–35)
AST: 13 U/L (ref 0–37)
Albumin: 4.3 g/dL (ref 3.5–5.2)
Alkaline Phosphatase: 45 U/L (ref 39–117)
BUN: 23 mg/dL (ref 6–23)
CO2: 28 mEq/L (ref 19–32)
Calcium: 9.4 mg/dL (ref 8.4–10.5)
Chloride: 107 mEq/L (ref 96–112)
Creatinine, Ser: 1.35 mg/dL — ABNORMAL HIGH (ref 0.40–1.20)
GFR: 38.25 mL/min — ABNORMAL LOW (ref >60.00)
Glucose, Bld: 93 mg/dL (ref 70–99)
Potassium: 4 mEq/L (ref 3.5–5.1)
Sodium: 144 mEq/L (ref 135–145)
Total Bilirubin: 0.7 mg/dL (ref 0.2–1.2)
Total Protein: 6.8 g/dL (ref 6.0–8.3)

## 2019-03-05 LAB — LIPID PANEL
Cholesterol: 192 mg/dL (ref 0–200)
HDL: 53.5 mg/dL (ref >39.00)
NonHDL: 138.78
Total CHOL/HDL Ratio: 4
Triglycerides: 221 mg/dL — ABNORMAL HIGH (ref 0.0–149.0)
VLDL: 44.2 mg/dL — ABNORMAL HIGH (ref 0.0–40.0)

## 2019-03-05 LAB — HEMOGLOBIN A1C: Hgb A1c MFr Bld: 5.3 % (ref 4.6–6.5)

## 2019-03-05 LAB — TSH: TSH: 2 u[IU]/mL (ref 0.35–4.50)

## 2019-03-05 LAB — LDL CHOLESTEROL, DIRECT: Direct LDL: 103 mg/dL

## 2019-03-05 MED ORDER — RAMIPRIL 10 MG PO CAPS: 10.0000 mg | ORAL_CAPSULE | Freq: Every day | ORAL | 5 refills | Status: DC

## 2019-03-05 MED ORDER — ALPRAZOLAM 0.5 MG PO TABS: 0.5000 mg | ORAL_TABLET | Freq: Three times a day (TID) | ORAL | 0 refills | Status: DC | PRN

## 2019-03-05 NOTE — Assessment & Plan Note (Signed)
GERD controlled Continue daily medication  

## 2019-03-05 NOTE — Assessment & Plan Note (Signed)
Has been on several medications in the past most of which she had side effects to Taking Seroquel without side effects-at her last visit it was working, but does not seem to be working anymore I will continue her on the current dose

## 2019-03-05 NOTE — Assessment & Plan Note (Signed)
Secondary to osteoarthritis, Continue Tylenol, diclofenac gel I will not prescribe her any narcotics-she does not want to go back to pain management and states she will just deal with the pain

## 2019-03-05 NOTE — Assessment & Plan Note (Signed)
Fatigued, but this is likely not from her thyroid Clinically euthyroid Check tsh  Titrate med dose if needed

## 2019-03-05 NOTE — Assessment & Plan Note (Signed)
Continue statin Check lipid panel, CMP 

## 2019-03-05 NOTE — Assessment & Plan Note (Signed)
Not controlled Increase altace to 20 mg daily cmp

## 2019-03-05 NOTE — Assessment & Plan Note (Signed)
Has been on multiple medications and her anxiety as long as I have known her has never been controlled I will not increase her alprazolam and nothing else has worked for her Continue sertraline 150 mg daily Ideally I would like her to see a psychiatrist and a therapist, but neither are covered by insurance and she refuses to see them

## 2019-03-07 ENCOUNTER — Telehealth: Payer: Self-pay | Admitting: Internal Medicine

## 2019-03-07 MED ORDER — RAMIPRIL 10 MG PO CAPS: 10.0000 mg | ORAL_CAPSULE | Freq: Every day | ORAL | 5 refills | Status: DC

## 2019-03-07 NOTE — Telephone Encounter (Signed)
Spoke with pt to help with med issue. The directions on the rx were for her to take 1 tablet daily when she is supposed to take 2 tablets daily. New rx sent with updated directions.

## 2019-03-07 NOTE — Telephone Encounter (Signed)
Copied from [facility] [id number]. Topic: Quick Communication - Rx Refill/Question >> Mar 07, 2019  9:45 AM Mathis Bud wrote: Medication:  ramipril (ALTACE) 10 MG capsule  Has the patient contacted their pharmacy? No more refills. Patient states that pharmacy states that they cannot refill medication till July. Patient is confused  Preferred Pharmacy (with phone number or street name): [facility], [place] [phone] (Phone) [phone] (Fax)    Agent: Please be advised that RX refills may take up to 3 business days. We ask that you follow-up with your pharmacy.

## 2019-03-08 ENCOUNTER — Telehealth: Payer: Self-pay | Admitting: Internal Medicine

## 2019-03-08 MED ORDER — RAMIPRIL 10 MG PO CAPS: ORAL_CAPSULE | ORAL | 1 refills | Status: DC

## 2019-03-08 NOTE — Telephone Encounter (Signed)
Pt informed of below.  

## 2019-03-08 NOTE — Telephone Encounter (Signed)
Copied from [facility] [id number]. Topic: Quick Communication - Rx Refill/Question >> Mar 08, 2019 10:54 AM Virl Axe D wrote: Medication: ramipril (ALTACE) 10 MG capsule / Pharmacy stated that the rx for ramipril came with two sets of instructions. Need clarification.  Has the patient contacted their pharmacy? Yes.   (Agent: If no, request that the patient contact the pharmacy for the refill.) (Agent: If yes, when and what did the pharmacy advise?)  Preferred Pharmacy (with phone number or street name): [facility], [place] [phone] (Phone) [phone] (Fax)    Agent: Please be advised that RX refills may take up to 3 business days. We ask that you follow-up with your pharmacy.

## 2019-03-08 NOTE — Telephone Encounter (Signed)
New prescription sent to pharmacy so there would not be any confusion.  She should not take any over-the-counter potassium.

## 2019-03-08 NOTE — Telephone Encounter (Addendum)
Pt called in stating the pharmacy cannot fill RX. She said she was advised to take ramapril 10mg  twice a day. The RX sent has both 1/day and 2/day and needs corrected. Pt also asking that it be filled for 90 day supply (#180 for 3 months) please.   Pt asking if she can take OTC potassium 99mg  because the hospital advised her kidneys not doing well.  [facility], [place] [phone] (Phone) [phone] (Fax)

## 2019-03-08 NOTE — Addendum Note (Signed)
Addended by: Binnie Rail on: 03/08/2019 12:00 PM   Modules accepted: Orders

## 2019-03-08 NOTE — Telephone Encounter (Signed)
Please advise about OTC KcL 99 mg. I will call to cancel rx for 1 qd ramipril.

## 2019-03-09 NOTE — Telephone Encounter (Signed)
Gave correct directions.

## 2019-03-19 ENCOUNTER — Other Ambulatory Visit: Payer: Self-pay | Admitting: Internal Medicine

## 2019-03-28 ENCOUNTER — Ambulatory Visit: Payer: Medicare Other | Admitting: Internal Medicine

## 2019-04-02 ENCOUNTER — Telehealth: Payer: Self-pay | Admitting: Internal Medicine

## 2019-04-02 ENCOUNTER — Other Ambulatory Visit: Payer: Self-pay | Admitting: Internal Medicine

## 2019-04-02 NOTE — Telephone Encounter (Signed)
Medication Refill - Medication: ALPRAZolam (XANAX) 0.5 MG tablet    Has the patient contacted their pharmacy? Yes.   (Agent: If no, request that the patient contact the pharmacy for the refill.) (Agent: If yes, when and what did the pharmacy advise?)  Preferred Pharmacy (with phone number or street name):  [facility], [place] [phone] (Phone) [phone] (Fax)     Agent: Please be advised that RX refills may take up to 3 business days. We ask that you follow-up with your pharmacy.

## 2019-04-02 NOTE — Telephone Encounter (Signed)
Last refill was 03/08/19 Last OV 03/05/19 Next OV 09/04/19

## 2019-04-02 NOTE — Telephone Encounter (Signed)
Responded to by rx requests.

## 2019-04-10 ENCOUNTER — Other Ambulatory Visit: Payer: Self-pay | Admitting: Internal Medicine

## 2019-04-11 ENCOUNTER — Other Ambulatory Visit: Payer: Self-pay | Admitting: Internal Medicine

## 2019-04-25 ENCOUNTER — Telehealth: Payer: Self-pay | Admitting: Internal Medicine

## 2019-04-25 DIAGNOSIS — E789 Disorder of lipoprotein metabolism, unspecified: Secondary | ICD-10-CM

## 2019-04-25 NOTE — Telephone Encounter (Signed)
Relation to pt: self  Call back number: [phone]   Reason for call:  Patient last seen PCP 03/05/2019 and states urine orders should have been placed therefore requesting urine orders and would like her cholesterol check. Patient would like to come in the same day she's scheduled with Dr. Tamala Julian. Please advise

## 2019-04-25 NOTE — Telephone Encounter (Signed)
Appointment with Dawn Kim has been by sports med for 7/30.

## 2019-04-27 NOTE — Telephone Encounter (Signed)
Spoke with pt and advised that she had her cholesterol done back in June and she stated that she wanted it rechecked because her numbers were high. Also she got the kidney function lab confused thinking she needed to give a urine to check kidney function. I advised that she has lab work put in already to recheck her kidney function. Do you want to add another cholesterol so soon?

## 2019-04-27 NOTE — Telephone Encounter (Signed)
Pt aware.

## 2019-04-27 NOTE — Addendum Note (Signed)
Addended by: Delice Bison E on: 04/27/2019 03:05 PM   Modules accepted: Orders

## 2019-04-27 NOTE — Telephone Encounter (Signed)
Ok to add cholesterol - needs to fast

## 2019-04-27 NOTE — Telephone Encounter (Signed)
Can UA be ordered for patient

## 2019-04-30 ENCOUNTER — Other Ambulatory Visit: Payer: Self-pay | Admitting: Internal Medicine

## 2019-04-30 NOTE — Telephone Encounter (Signed)
Last RF was 04/04/19 Last OV 03/05/19 Next OV 09/04/19

## 2019-05-03 ENCOUNTER — Ambulatory Visit: Payer: Medicare Other | Admitting: Family Medicine

## 2019-05-17 ENCOUNTER — Other Ambulatory Visit (INDEPENDENT_AMBULATORY_CARE_PROVIDER_SITE_OTHER): Payer: Medicare Other

## 2019-05-17 DIAGNOSIS — E789 Disorder of lipoprotein metabolism, unspecified: Secondary | ICD-10-CM

## 2019-05-17 LAB — LIPID PANEL
Cholesterol: 204 mg/dL — ABNORMAL HIGH (ref 0–200)
HDL: 53.3 mg/dL (ref >39.00)
LDL Cholesterol: 115 mg/dL — ABNORMAL HIGH (ref 0–99)
NonHDL: 150.62
Total CHOL/HDL Ratio: 4
Triglycerides: 180 mg/dL — ABNORMAL HIGH (ref 0.0–149.0)
VLDL: 36 mg/dL (ref 0.0–40.0)

## 2019-05-28 ENCOUNTER — Telehealth: Payer: Self-pay | Admitting: Internal Medicine

## 2019-05-28 MED ORDER — ALPRAZOLAM 0.5 MG PO TABS: ORAL_TABLET | ORAL | 0 refills | Status: DC

## 2019-05-28 NOTE — Telephone Encounter (Signed)
Medication Refill - Medication:   ALPRAZolam (XANAX) 0.5 MG tablet   Pt states medication is due for refill on 8/29, but wanted to make sure there was plenty of time to get it filled.   Preferred Pharmacy:  [place], [place] [phone] (Phone) [phone] (Fax)     Pt was advised that RX refills may take up to 3 business days. We ask that you follow-up with your pharmacy.

## 2019-05-28 NOTE — Telephone Encounter (Signed)
Check [place] registry last filled 05/02/2019.Marland KitchenJohny Kim

## 2019-06-09 ENCOUNTER — Other Ambulatory Visit: Payer: Self-pay | Admitting: Internal Medicine

## 2019-06-09 DIAGNOSIS — Z1211 Encounter for screening for malignant neoplasm of colon: Secondary | ICD-10-CM | POA: Diagnosis not present

## 2019-06-09 DIAGNOSIS — Z1212 Encounter for screening for malignant neoplasm of rectum: Secondary | ICD-10-CM | POA: Diagnosis not present

## 2019-06-12 ENCOUNTER — Other Ambulatory Visit (INDEPENDENT_AMBULATORY_CARE_PROVIDER_SITE_OTHER): Payer: Medicare Other

## 2019-06-12 DIAGNOSIS — N289 Disorder of kidney and ureter, unspecified: Secondary | ICD-10-CM | POA: Diagnosis not present

## 2019-06-12 LAB — BASIC METABOLIC PANEL
BUN: 18 mg/dL (ref 6–23)
CO2: 29 mEq/L (ref 19–32)
Calcium: 9.5 mg/dL (ref 8.4–10.5)
Chloride: 105 mEq/L (ref 96–112)
Creatinine, Ser: 1.03 mg/dL (ref 0.40–1.20)
GFR: 52.23 mL/min — ABNORMAL LOW (ref >60.00)
Glucose, Bld: 88 mg/dL (ref 70–99)
Potassium: 4.6 mEq/L (ref 3.5–5.1)
Sodium: 142 mEq/L (ref 135–145)

## 2019-06-13 ENCOUNTER — Ambulatory Visit (INDEPENDENT_AMBULATORY_CARE_PROVIDER_SITE_OTHER)
Admission: RE | Admit: 2019-06-13 | Discharge: 2019-06-13 | Disposition: A | Discharge status: Home / Self Care | Payer: Medicare Other | Source: Ambulatory Visit | Attending: Family Medicine | Admitting: Family Medicine

## 2019-06-13 ENCOUNTER — Ambulatory Visit: Payer: Medicare Other | Admitting: Family Medicine

## 2019-06-13 ENCOUNTER — Other Ambulatory Visit: Payer: Self-pay

## 2019-06-13 ENCOUNTER — Encounter: Payer: Self-pay | Admitting: Family Medicine

## 2019-06-13 VITALS — BP 110/86 | HR 72 | Ht 65.0 in | Wt 198.0 lb

## 2019-06-13 DIAGNOSIS — R296 Repeated falls: Secondary | ICD-10-CM | POA: Diagnosis not present

## 2019-06-13 DIAGNOSIS — M545 Low back pain, unspecified: Secondary | ICD-10-CM

## 2019-06-13 DIAGNOSIS — M85839 Other specified disorders of bone density and structure, unspecified forearm: Secondary | ICD-10-CM | POA: Diagnosis not present

## 2019-06-13 DIAGNOSIS — G8929 Other chronic pain: Secondary | ICD-10-CM

## 2019-06-13 MED ORDER — METHYLPREDNISOLONE ACETATE 80 MG/ML IJ SUSP
80.0000 mg | Freq: Once | INTRAMUSCULAR | Status: AC
  Administered 2019-06-13: 80 mg via INTRAMUSCULAR

## 2019-06-13 MED ORDER — KETOROLAC TROMETHAMINE 60 MG/2ML IM SOLN
60.0000 mg | Freq: Once | INTRAMUSCULAR | Status: AC
  Administered 2019-06-13: 60 mg via INTRAMUSCULAR

## 2019-06-13 NOTE — Patient Instructions (Addendum)
Good to see you  Ice is your friend Stay active We did 2 injections today  Exercises 3 times a week.  pennsaid pinkie amount topically 2 times daily as needed.  See me again in 4 weeks

## 2019-06-13 NOTE — Progress Notes (Signed)
[facility] [address] [place], [place] [address] Phone: [phone] Subjective:   Fontaine No, am serving as a scribe for Dr. Hulan Saas.   CC: Back pain  [id number]   11/20/2018 Bilateral foot pain.  Some mild decrease in distal pulses.  ABIs ordered.  Started Pletal.  Could be more vascular.  I do believe there is a neurogenic aspect as well likely some peripheral neuropathy.  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  Discussed B12 and B6 supplementation.  Patient adamant she did not want to try neuromodulator such as gabapentin at this time.  Depending on ABI hallux we will discuss further management.  Discussed over-the-counter orthotics and home exercises for the breakdown of the transverse arch.  Follow-up again in 4 to 6 weeks  Update 06/13/2019 ELLER GOTTS is a 73 y.o. female coming in with complaint of back pain. Patient last seen for foot and leg pain. Patient states that she has been using voltaren gel. Pain for 6-7 months. Pain is constant. Pain increases with forward flexion. Unable to sleep at night. Has been using Tylenol for pain.     Past Medical History:  Diagnosis Date  . Anxiety   . Cataract    Bil  . Depression   . Hypercholesteremia   . Hypertension   . Thyroid disease    Past Surgical History:  Procedure Laterality Date  . CHOLECYSTECTOMY    . COLONOSCOPY    . FOOT SURGERY  2011   right foot  . JOINT REPLACEMENT  2010   left knee  . POLYPECTOMY    . REPLACEMENT TOTAL KNEE Left   . TONSILLECTOMY     Social History   Socioeconomic History  . Marital status: Married    Spouse name: Not on file  . Number of children: 3  . Years of education: 41  . Highest education level: Not on file  Occupational History  . Occupation: Retired   Scientific laboratory technician  . Financial resource strain: Not very hard  . Food insecurity    Worry:  Never true    Inability: Never true  . Transportation needs    Medical: No    Non-medical: No  Tobacco Use  . Smoking status: Former Smoker    Types: Cigarettes    Quit date: 11/04/2009    Years since quitting: 9.6  . Smokeless tobacco: Never Used  Substance and Sexual Activity  . Alcohol use: No  . Drug use: No  . Sexual activity: Never  Lifestyle  . Physical activity    Days per week: 0 days    Minutes per session: 0 min  . Stress: To some extent  Relationships  . Social Herbalist on phone: Twice a week    Gets together: Twice a week    Attends religious service: 1 to 4 times per year    Active member of club or organization: Yes    Attends meetings of clubs or organizations: 1 to 4 times per year    Relationship status: Married  Other Topics Concern  . Not on file  Social History Narrative   Cares for 55yo husband; has daughter locally who can help if needed   Allergies  Allergen Reactions  . Gabapentin     Caused parkinsonism  . Lunesta [Eszopiclone]     Ataxia, confusion  . Penicillins Hives  Has patient had a PCN reaction causing immediate rash, facial/tongue/throat swelling, SOB or lightheadedness with hypotension: No Has patient had a PCN reaction causing severe rash involving mucus membranes or skin necrosis: Yes  Has patient had a PCN reaction that required hospitalization: No Has patient had a PCN reaction occurring within the last 10 years: No  If all of the above answers are "NO", then may proceed with Cephalosporin use.   [name] Amoxicillin Rash    Has patient had a PCN reaction causing immediate rash, facial/tongue/throat swelling, SOB or lightheadedness with hypotension: No  Has patient had a PCN reaction causing severe rash involving mucus membranes or skin necrosis: Yes Has patient had a PCN reaction that required hospitalization No Has patient had a PCN reaction occurring within the last 10 years: No If all of the above answers are "NO",  then may proceed with Cephalosporin use.    Family History  Problem Relation Age of Onset  . Heart disease Mother   . Drug abuse Sister     Current Outpatient Medications (Endocrine & Metabolic):  .  levothyroxine (SYNTHROID) 112 MCG tablet, Take 1 tablet by mouth once daily  Current Outpatient Medications (Cardiovascular):  .  ramipril (ALTACE) 10 MG capsule, Take 1 capsule (10 mg) by mouth twice daily .  simvastatin (ZOCOR) 40 MG tablet, Take 1 tablet by mouth in the evening  Current Outpatient Medications (Respiratory):  .  levocetirizine (XYZAL) 5 MG tablet, TAKE 1 TABLET BY MOUTH ONCE DAILY IN THE EVENING   Current Outpatient Medications (Hematological):  .  cilostazol (PLETAL) 50 MG tablet, Take 0.5 tablets (25 mg total) by mouth 2 (two) times daily. .  ferrous sulfate 325 (65 FE) MG EC tablet, Take 325 mg by mouth daily with breakfast.  Current Outpatient Medications (Other):  [name]  ALPRAZolam (XANAX) 0.5 MG tablet, TAKE 1 TABLET BY MOUTH THREE TIMES DAILY AS NEEDED FOR ANXIETY . DO NOT EXCEED 3 PER 24 HOURS .  calcium carbonate (OS-CAL - DOSED IN MG OF ELEMENTAL CALCIUM) 1250 (500 Ca) MG tablet, Take 1 tablet (500 mg of elemental calcium total) by mouth daily. .  cholecalciferol (VITAMIN D) 1000 UNITS tablet, Take 1,000 Units by mouth daily. .  diclofenac sodium (VOLTAREN) 1 % GEL, Apply 4 g topically 4 (four) times daily. .  famotidine (PEPCID) 40 MG tablet, TAKE 1 TABLET BY MOUTH AT BEDTIME .  fish oil-omega-3 fatty acids 1000 MG capsule, Take 1 g by mouth 2 (two) times daily.  .  Multiple Vitamin (MULTIVITAMIN) tablet, Take 1 tablet by mouth daily. [name]  omeprazole (PRILOSEC) 40 MG capsule, TAKE 1 CAPSULE BY MOUTH TWICE DAILY BEFORE A MEAL .  QUEtiapine (SEROQUEL) 50 MG tablet, TAKE 1 TABLET BY MOUTH AT BEDTIME .  sertraline (ZOLOFT) 50 MG tablet, Take 3 tablets (150 mg total) by mouth daily.    Past medical history, social, surgical and family history all reviewed in  electronic medical record.  No pertanent information unless stated regarding to the chief complaint.   Review of Systems:  No headache, visual changes, nausea, vomiting, diarrhea, constipation, dizziness, abdominal pain, skin rash, fevers, chills, night sweats, weight loss, swollen lymph nodes, body aches, joint swelling, , chest pain, shortness of breath, mood changes.  His other muscle aches  Objective  Blood pressure 110/86, pulse 72, height 5\' 5"  (1.651 m), weight 198 lb (89.8 kg), SpO2 97 %.     General: No apparent distress alert and oriented x3 mood and affect normal, dressed appropriately.  HEENT: Pupils equal but dilated, extraocular movements intact  Respiratory: Patient's speak in full sentences and does not appear short of breath  Cardiovascular: Trace lower extremity edema, non tender, no erythema  Skin: Warm dry intact with no signs of infection or rash on extremities or on axial skeleton.  Abdomen: Soft nontender  Neuro: Cranial nerves II through XII are intact, neurovascularly intact in all extremities with 2+ DTRs and 2+ pulses.  Lymph: No lymphadenopathy of posterior or anterior cervical chain or axillae bilaterally.  Gait antalgic MSK:  tender with full range of motion and good stability and symmetric strength and tone of shoulders, elbows, wrist, hip, knee and ankles bilaterally.  Back exam does have some loss of lordosis with some mild degenerative scoliosis noted of the lower spine.  Patient does have some tenderness to palpation midline more around the sacrum and L5 area.  Negative straight leg test, difficult to do [facility] test but no significant increase in back pain with the test.  Neurovascular intact distally.   Impression and Recommendations:     This case required medical decision making of moderate complexity. The above documentation has been reviewed and is accurate and complete Lyndal Pulley, DO       Note: This dictation was prepared with Dragon  dictation along with smaller phrase technology. Any transcriptional errors that result from this process are unintentional.

## 2019-06-14 ENCOUNTER — Encounter: Payer: Self-pay | Admitting: Family Medicine

## 2019-06-14 NOTE — Assessment & Plan Note (Signed)
Chronic low back pain again.  Discussed with patient in great length about icing regimen and home exercise.  Discussed which activities doing which 1 to avoid.  Patient is to increase activity slowly.  History of a compression fracture of T12 and secondary to midline tenderness new x-rays ordered.  Concern with patient following the likely has a potential drinking problem.  Patient seemed inebriated.  Patient did not want to talk about this.  Patient given exercises and will avoid any mental altering medications secondary to her fall risk.  Follow-up with me again in 3 to 4 weeks

## 2019-06-19 LAB — COLOGUARD: Cologuard: NEGATIVE

## 2019-06-26 ENCOUNTER — Encounter: Payer: Self-pay | Admitting: Internal Medicine

## 2019-06-26 NOTE — Progress Notes (Signed)
Outside notes received. Information abstracted. Notes sent to scan.  

## 2019-06-28 ENCOUNTER — Other Ambulatory Visit: Payer: Self-pay | Admitting: Internal Medicine

## 2019-06-28 MED ORDER — ALPRAZOLAM 0.5 MG PO TABS: ORAL_TABLET | ORAL | 0 refills | Status: DC

## 2019-06-28 NOTE — Telephone Encounter (Signed)
Requested medication (s) are due for refill today: no  Requested medication (s) are on the active medication list: yes  Last refill:  05/28/2019  Future visit scheduled: yes  Notes to clinic:  Patient would a early refill   Requested Prescriptions  Pending Prescriptions Disp Refills   ALPRAZolam (XANAX) 0.5 MG tablet 90 tablet 0    Sig: TAKE 1 TABLET BY MOUTH THREE TIMES DAILY AS NEEDED FOR ANXIETY . DO NOT EXCEED 3 PER 24 HOURS     Not Delegated - Psychiatry:  Anxiolytics/Hypnotics Failed - 06/28/2019  9:17 AM      Failed - This refill cannot be delegated      Failed - Urine Drug Screen completed in last 360 days.      Passed - Valid encounter within last 6 months    Recent Outpatient Visits          2 weeks ago Lumbar spine pain   [place], DO   3 months ago Essential hypertension   [place], Claudina Lick, MD   7 months ago Bilateral leg and foot pain   [place], [place], DO   8 months ago Essential hypertension   [place], MD   9 months ago Essential hypertension   [place], Claudina Lick, MD      Future Appointments            In 1 week Lyndal Pulley, [place], [place]   In 2 months Burns, Claudina Lick, MD [place], [facility]

## 2019-06-28 NOTE — Addendum Note (Signed)
Addended by: Jefferson Fuel on: 06/28/2019 09:17 AM   Modules accepted: Orders

## 2019-06-28 NOTE — Telephone Encounter (Signed)
Medication Refill - Medication:  ALPRAZolam (XANAX) 0.5 MG tablet   Has the patient contacted their pharmacy? Yes advised to call office. Patient is aware it isnt time but she wanted to get ahead of it.   Preferred Pharmacy (with phone number or street name):  [place], [place] [phone] (Phone) [phone] (Fax)   Agent: Please be advised that RX refills may take up to 3 business days. We ask that you follow-up with your pharmacy.

## 2019-06-28 NOTE — Telephone Encounter (Signed)
Last OV 03/05/19 Next OV 09/04/19 Last RF 05/31/19

## 2019-07-04 ENCOUNTER — Telehealth: Payer: Self-pay

## 2019-07-04 NOTE — Telephone Encounter (Signed)
Pt aware of results 

## 2019-07-04 NOTE — Telephone Encounter (Signed)
Copied from [place] [id number]. Topic: Quick Communication - Other Results (Clinic Use ONLY) >> Jul 04, 2019 11:15 AM [name] wrote: Pt is calling and had cologuard test about 2 month ago and would like the results

## 2019-07-11 ENCOUNTER — Ambulatory Visit: Payer: Medicare Other | Admitting: Family Medicine

## 2019-07-24 ENCOUNTER — Other Ambulatory Visit: Payer: Self-pay | Admitting: Internal Medicine

## 2019-07-24 NOTE — Telephone Encounter (Signed)
Routing to CMA 

## 2019-07-24 NOTE — Telephone Encounter (Signed)
Medication Refill - Medication:  ALPRAZolam (XANAX) 0.5 MG tablet   Has the patient contacted their pharmacy? Yes advised to call office.   Preferred Pharmacy (with phone number or street name):  [place], [place] [phone] (Phone) [phone] (Fax)   Agent: Please be advised that RX refills may take up to 3 business days. We ask that you follow-up with your pharmacy.

## 2019-07-24 NOTE — Telephone Encounter (Signed)
Requested medication (s) are due for refill today: yes  Requested medication (s) are on the active medication list: yes  Last refill:  06/28/2019  Future visit scheduled: yes  Notes to clinic:  Refill cannot be delegated   Requested Prescriptions  Pending Prescriptions Disp Refills   ALPRAZolam (XANAX) 0.5 MG tablet 90 tablet 0    Sig: TAKE 1 TABLET BY MOUTH THREE TIMES DAILY AS NEEDED FOR ANXIETY . DO NOT EXCEED 3 PER 24 HOURS     Not Delegated - Psychiatry:  Anxiolytics/Hypnotics Failed - 07/24/2019  9:51 AM      Failed - This refill cannot be delegated      Failed - Urine Drug Screen completed in last 360 days.      Passed - Valid encounter within last 6 months    Recent Outpatient Visits          1 month ago Lumbar spine pain   [place], DO   4 months ago Essential hypertension   [place], MD   8 months ago Bilateral leg and foot pain   [place], [place], DO   9 months ago Essential hypertension   [place], MD   10 months ago Essential hypertension   [place], MD      Future Appointments            In 1 month Burns, Claudina Lick, MD [place], [place]

## 2019-07-25 ENCOUNTER — Other Ambulatory Visit: Payer: Self-pay | Admitting: Internal Medicine

## 2019-07-25 MED ORDER — ALPRAZOLAM 0.5 MG PO TABS: ORAL_TABLET | ORAL | 0 refills | Status: DC

## 2019-07-25 NOTE — Telephone Encounter (Signed)
Last OV 03/05/19 Next OV 09/04/19 Last RF 06/28/19

## 2019-07-30 ENCOUNTER — Other Ambulatory Visit: Payer: Self-pay

## 2019-07-30 DIAGNOSIS — Z20822 Contact with and (suspected) exposure to covid-19: Secondary | ICD-10-CM

## 2019-07-31 LAB — NOVEL CORONAVIRUS, NAA: SARS-CoV-2, NAA: NOT DETECTED

## 2019-08-06 ENCOUNTER — Telehealth: Payer: Self-pay | Admitting: Internal Medicine

## 2019-08-06 NOTE — Telephone Encounter (Signed)
° °  Pt rec neg COVID results °

## 2019-08-13 ENCOUNTER — Other Ambulatory Visit: Payer: Self-pay | Admitting: Internal Medicine

## 2019-08-23 ENCOUNTER — Other Ambulatory Visit: Payer: Self-pay | Admitting: Internal Medicine

## 2019-08-23 ENCOUNTER — Telehealth: Payer: Self-pay

## 2019-08-23 MED ORDER — ALPRAZOLAM 0.5 MG PO TABS: ORAL_TABLET | ORAL | 0 refills | Status: DC

## 2019-08-23 NOTE — Telephone Encounter (Signed)
PA for zoloft initiated.   [name]

## 2019-08-23 NOTE — Telephone Encounter (Signed)
Routing to CMA 

## 2019-08-23 NOTE — Telephone Encounter (Signed)
Check [place] registry last filled 07/26/2019.Marland KitchenJohny Kim

## 2019-08-23 NOTE — Telephone Encounter (Signed)
Medication Refill - Medication: alprazolam  Has the patient contacted their pharmacy? No. (Agent: If no, request that the patient contact the pharmacy for the refill.) (Agent: If yes, when and what did the pharmacy advise?)  Preferred Pharmacy (with phone number or street name):  [place], [place] [address]  Phone: [phone] Fax: [phone]  Not a 24 hour pharmacy; exact hours not known.     Agent: Please be advised that RX refills may take up to 3 business days. We ask that you follow-up with your pharmacy.

## 2019-08-23 NOTE — Telephone Encounter (Signed)
Requested medication (s) are due for refill today: yes  Requested medication (s) are on the active medication list: yes  Last refill:  07/25/2019  Future visit scheduled:yes  Notes to clinic:  Refill cannot be delegated    Requested Prescriptions  Pending Prescriptions Disp Refills   ALPRAZolam (XANAX) 0.5 MG tablet 90 tablet 0    Sig: TAKE 1 TABLET BY MOUTH THREE TIMES DAILY AS NEEDED FOR ANXIETY . DO NOT EXCEED 3 PER 24 HOURS     Not Delegated - Psychiatry:  Anxiolytics/Hypnotics Failed - 08/23/2019  8:42 AM      Failed - This refill cannot be delegated      Failed - Urine Drug Screen completed in last 360 days.      Passed - Valid encounter within last 6 months    Recent Outpatient Visits          2 months ago Lumbar spine pain   [place], [place], DO   5 months ago Essential hypertension   [place], MD   9 months ago Bilateral leg and foot pain   [place], [place], DO   10 months ago Essential hypertension   [place], MD   11 months ago Essential hypertension   [place], MD      Future Appointments            In 1 week Burns, Claudina Lick, MD [place], [facility]

## 2019-08-24 NOTE — Telephone Encounter (Signed)
Zoloft 50 mg PA is approved until 08/22/20.

## 2019-09-04 ENCOUNTER — Ambulatory Visit: Payer: Medicare Other | Admitting: Internal Medicine

## 2019-09-07 ENCOUNTER — Telehealth: Payer: Self-pay

## 2019-09-07 NOTE — Telephone Encounter (Signed)
Copied from [place] [id number]. Topic: General - Other >> Sep 07, 2019  9:20 AM Leward Quan A wrote: Reason for CRM: Patient called to inquire of Dr Quay Burow if she would like for her to come in and do labs since she is unable to come into the office to be seen. States that last scheduled appointment was cancelled due to a death in the family and also want to let Dr Quay Burow know that she is doing well at this time. Say that she is willing to do an appointment by phone since she is not computer savvy. Please call patient at Ph# [phone]

## 2019-09-07 NOTE — Telephone Encounter (Signed)
Pt aware she needs an appointment of some sort for labs.

## 2019-09-10 ENCOUNTER — Other Ambulatory Visit: Payer: Medicare Other

## 2019-09-12 NOTE — Progress Notes (Signed)
Subjective:    Patient ID: Dawn Kim, female    DOB: 06-25-1946, 73 y.o.   MRN: [id number]  HPI The patient is here for follow up.  Hypertension: She is taking her medication daily. She is compliant with a low sodium diet.  She denies chest pain (except with stress), palpitations, edema, shortness of breath and regular headaches.     Hypothyroidism:  She is taking her medication daily.  She feels more fatigued and has gained weight w/o changes in her diet.  she is not exercising. [name]   Hyperlipidemia: She is taking her medication daily. She is compliant with a low fat/cholesterol diet. She denies myalgias.   GERD:  She is taking her medication daily as prescribed.  She denies any GERD symptoms and feels her GERD is well controlled.   Anxiety: She is taking her medication daily as prescribed. She denies any side effects from the medication. She feels her anxiety is still high.  She wonders about increasing the sertraline and the alprazolam.    Insomnia:  She takes seroquel nightly.  It takes her a while to fall asleep and she typically gets about 6 hours.  She wonders about adding trazodone to her current medications.  Chronic knee pain:  She takes tylenol and uses diclofenac gel.  She feels her knee pain is better.      Medications and allergies reviewed with patient and updated if appropriate.  Patient Active Problem List   Diagnosis Date Noted  . 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 Confusion 06/06/2018  . Frequent falls 06/06/2018  . T12 compression fracture ([place]) 03/28/2018  . Midline low back pain without sciatica 03/21/2018  . Head trauma 03/21/2018  . Shortness of breath 01/06/2018  . Chronic lower back pain 09/19/2017  . 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 Osteopenia 06/21/2016  . Hypothyroidism 10/22/2015  . Depression 10/22/2015  . Anxiety 10/22/2015  . GERD (gastroesophageal  reflux disease) 10/22/2015  . Insomnia 10/22/2015  . Bilateral foot pain 10/22/2015  . Bilateral knee pain 10/22/2015  . HTN (hypertension) 01/20/2012  . Lipid disorder 01/20/2012    Current Outpatient Medications on File Prior to Visit  Medication Sig Dispense Refill  . ALPRAZolam (XANAX) 0.5 MG tablet TAKE 1 TABLET BY MOUTH THREE TIMES DAILY AS NEEDED FOR ANXIETY . DO NOT EXCEED 3 PER 24 HOURS 90 tablet 0  . calcium carbonate (OS-CAL - DOSED IN MG OF ELEMENTAL CALCIUM) 1250 (500 Ca) MG tablet Take 1 tablet (500 mg of elemental calcium total) by mouth daily.    . cholecalciferol (VITAMIN D) 1000 UNITS tablet Take 1,000 Units by mouth daily.    . cilostazol (PLETAL) 50 MG tablet Take 0.5 tablets (25 mg total) by mouth 2 (two) times daily. 60 tablet 1  . diclofenac sodium (VOLTAREN) 1 % GEL Apply 4 g topically 4 (four) times daily. 100 g 5  . famotidine (PEPCID) 40 MG tablet TAKE 1 TABLET BY MOUTH AT BEDTIME 90 tablet 0  . ferrous sulfate 325 (65 FE) MG EC tablet Take 325 mg by mouth daily with breakfast.    . fish oil-omega-3 fatty acids 1000 MG capsule Take 1 g by mouth 2 (two) times daily.     [name] levocetirizine (XYZAL) 5 MG tablet TAKE 1 TABLET BY MOUTH ONCE DAILY IN THE EVENING 90 tablet 0  . levothyroxine (SYNTHROID) 112 MCG tablet Take 1 tablet by  mouth once daily 90 tablet 1  . Multiple Vitamin (MULTIVITAMIN) tablet Take 1 tablet by mouth daily.    [name] omeprazole (PRILOSEC) 40 MG capsule TAKE 1 CAPSULE BY MOUTH TWICE DAILY BEFORE A MEAL 180 capsule 0  . QUEtiapine (SEROQUEL) 50 MG tablet TAKE 1 TABLET BY MOUTH AT BEDTIME 90 tablet 0  . ramipril (ALTACE) 10 MG capsule Take 1 capsule by mouth twice daily 180 capsule 0  . simvastatin (ZOCOR) 40 MG tablet Take 1 tablet by mouth in the evening 90 tablet 1   No current facility-administered medications on file prior to visit.    Past Medical History:  Diagnosis Date  . Anxiety   . Cataract    Bil  . Depression   . Hypercholesteremia    . Hypertension   . Thyroid disease     Past Surgical History:  Procedure Laterality Date  . CHOLECYSTECTOMY    . COLONOSCOPY    . FOOT SURGERY  2011   right foot  . JOINT REPLACEMENT  2010   left knee  . POLYPECTOMY    . REPLACEMENT TOTAL KNEE Left   . TONSILLECTOMY      Social History   Socioeconomic History  . Marital status: Married    Spouse name: Not on file  . Number of children: 3  . Years of education: 50  . Highest education level: Not on file  Occupational History  . Occupation: Retired   Tobacco Use  . Smoking status: Former Smoker    Types: Cigarettes    Quit date: 11/04/2009    Years since quitting: 9.8  . Smokeless tobacco: Never Used  Substance and Sexual Activity  . Alcohol use: No  . Drug use: No  . Sexual activity: Never  Other Topics Concern  . Not on file  Social History Narrative   Cares for 40yo husband; has daughter locally who can help if needed   Social Determinants of Health   Financial Resource Strain: Low Risk   . Difficulty of Paying Living Expenses: Not very hard  Food Insecurity: No Food Insecurity  . Worried About Charity fundraiser in the Last Year: Never true  . Ran Out of Food in the Last Year: Never true  Transportation Needs: No Transportation Needs  . Lack of Transportation (Medical): No  . Lack of Transportation (Non-Medical): No  Physical Activity: Inactive  . Days of Exercise per Week: 0 days  . Minutes of Exercise per Session: 0 min  Stress: Stress Concern Present  . Feeling of Stress : To some extent  Social Connections: Not Isolated  . Frequency of Communication with Friends and Family: Twice a week  . Frequency of Social Gatherings with Friends and Family: Twice a week  . Attends Religious Services: 1 to 4 times per year  . Active Member of Clubs or Organizations: Yes  . Attends Archivist Meetings: 1 to 4 times per year  . Marital Status: Married    Family History  Problem Relation Age of Onset   . Heart disease Mother   . Drug abuse Sister     Review of Systems  Constitutional: Negative for chills and fever.  Respiratory: Negative for cough, shortness of breath and wheezing.   Cardiovascular: Positive for chest pain (occ with stress). Negative for palpitations and leg swelling.  Musculoskeletal: Positive for arthralgias.  Neurological: Negative for light-headedness and headaches.       Objective:   Vitals:   09/13/19 1105  BP: 130/74  Pulse: 62  Temp: 98.3 F (36.8 C)  SpO2: 99%   BP Readings from Last 3 Encounters:  09/13/19 130/74  06/13/19 110/86  03/05/19 (!) 162/88   Wt Readings from Last 3 Encounters:  09/13/19 202 lb 12.8 oz (92 kg)  06/13/19 198 lb (89.8 kg)  03/05/19 193 lb 12.8 oz (87.9 kg)   Body mass index is 33.75 kg/m.   Physical Exam    Constitutional: Appears well-developed and well-nourished. No distress.  HENT:  Head: Normocephalic and atraumatic.  Neck: Neck supple. No tracheal deviation present. No thyromegaly present.  No cervical lymphadenopathy Cardiovascular: Normal rate, regular rhythm and normal heart sounds.   No murmur heard. No carotid bruit .  No edema Pulmonary/Chest: Effort normal and breath sounds normal. No respiratory distress. No has no wheezes. No rales.  Skin: Skin is warm and dry. Not diaphoretic.  Psychiatric: Normal mood and affect. Behavior is normal.      Assessment & Plan:    See Problem List for Assessment and Plan of chronic medical problems.     This visit occurred during the SARS-CoV-2 public health emergency.  Safety protocols were in place, including screening questions prior to the visit, additional usage of staff PPE, and extensive cleaning of exam room while observing appropriate contact time as indicated for disinfecting solutions.

## 2019-09-13 ENCOUNTER — Other Ambulatory Visit (INDEPENDENT_AMBULATORY_CARE_PROVIDER_SITE_OTHER): Payer: Medicare Other

## 2019-09-13 ENCOUNTER — Ambulatory Visit (INDEPENDENT_AMBULATORY_CARE_PROVIDER_SITE_OTHER): Payer: Medicare Other | Admitting: Internal Medicine

## 2019-09-13 ENCOUNTER — Encounter: Payer: Self-pay | Admitting: Internal Medicine

## 2019-09-13 ENCOUNTER — Other Ambulatory Visit: Payer: Self-pay

## 2019-09-13 VITALS — BP 130/74 | HR 62 | Temp 98.3°F | Ht 65.0 in | Wt 202.8 lb

## 2019-09-13 DIAGNOSIS — K219 Gastro-esophageal reflux disease without esophagitis: Secondary | ICD-10-CM | POA: Diagnosis not present

## 2019-09-13 DIAGNOSIS — M25562 Pain in left knee: Secondary | ICD-10-CM

## 2019-09-13 DIAGNOSIS — F419 Anxiety disorder, unspecified: Secondary | ICD-10-CM | POA: Diagnosis not present

## 2019-09-13 DIAGNOSIS — I10 Essential (primary) hypertension: Secondary | ICD-10-CM | POA: Diagnosis not present

## 2019-09-13 DIAGNOSIS — E7800 Pure hypercholesterolemia, unspecified: Secondary | ICD-10-CM

## 2019-09-13 DIAGNOSIS — E038 Other specified hypothyroidism: Secondary | ICD-10-CM

## 2019-09-13 DIAGNOSIS — M25561 Pain in right knee: Secondary | ICD-10-CM

## 2019-09-13 DIAGNOSIS — G8929 Other chronic pain: Secondary | ICD-10-CM

## 2019-09-13 DIAGNOSIS — G4700 Insomnia, unspecified: Secondary | ICD-10-CM

## 2019-09-13 LAB — COMPREHENSIVE METABOLIC PANEL
ALT: 14 U/L (ref 0–35)
AST: 15 U/L (ref 0–37)
Albumin: 4.3 g/dL (ref 3.5–5.2)
Alkaline Phosphatase: 49 U/L (ref 39–117)
BUN: 18 mg/dL (ref 6–23)
CO2: 29 mEq/L (ref 19–32)
Calcium: 9.6 mg/dL (ref 8.4–10.5)
Chloride: 105 mEq/L (ref 96–112)
Creatinine, Ser: 1.04 mg/dL (ref 0.40–1.20)
GFR: 51.62 mL/min — ABNORMAL LOW (ref >60.00)
Glucose, Bld: 97 mg/dL (ref 70–99)
Potassium: 4.7 mEq/L (ref 3.5–5.1)
Sodium: 144 mEq/L (ref 135–145)
Total Bilirubin: 0.8 mg/dL (ref 0.2–1.2)
Total Protein: 6.9 g/dL (ref 6.0–8.3)

## 2019-09-13 LAB — CBC WITH DIFFERENTIAL/PLATELET
Basophils Absolute: 0 10*3/uL (ref 0.0–0.1)
Basophils Relative: 0.1 % (ref 0.0–3.0)
Eosinophils Absolute: 0.1 10*3/uL (ref 0.0–0.7)
Eosinophils Relative: 1.4 % (ref 0.0–5.0)
HCT: 43.9 % (ref 36.0–46.0)
Hemoglobin: 14.4 g/dL (ref 12.0–15.0)
Lymphocytes Relative: 19.8 % (ref 12.0–46.0)
Lymphs Abs: 1.3 10*3/uL (ref 0.7–4.0)
MCHC: 32.8 g/dL (ref 30.0–36.0)
MCV: 92.9 fl (ref 78.0–100.0)
Monocytes Absolute: 0.5 10*3/uL (ref 0.1–1.0)
Monocytes Relative: 7 % (ref 3.0–12.0)
Neutro Abs: 4.8 10*3/uL (ref 1.4–7.7)
Neutrophils Relative %: 71.7 % (ref 43.0–77.0)
Platelets: 230 10*3/uL (ref 150.0–400.0)
RBC: 4.73 Mil/uL (ref 3.87–5.11)
RDW: 14.2 % (ref 11.5–15.5)
WBC: 6.7 10*3/uL (ref 4.0–10.5)

## 2019-09-13 LAB — HEMOGLOBIN A1C: Hgb A1c MFr Bld: 5.2 % (ref 4.6–6.5)

## 2019-09-13 LAB — LIPID PANEL
Cholesterol: 206 mg/dL — ABNORMAL HIGH (ref 0–200)
HDL: 54 mg/dL (ref >39.00)
LDL Cholesterol: 113 mg/dL — ABNORMAL HIGH (ref 0–99)
NonHDL: 151.6
Total CHOL/HDL Ratio: 4
Triglycerides: 191 mg/dL — ABNORMAL HIGH (ref 0.0–149.0)
VLDL: 38.2 mg/dL (ref 0.0–40.0)

## 2019-09-13 LAB — TSH: TSH: 1.1 u[IU]/mL (ref 0.35–4.50)

## 2019-09-13 MED ORDER — SERTRALINE HCL 100 MG PO TABS: 200.0000 mg | ORAL_TABLET | Freq: Every day | ORAL | 5 refills | Status: DC

## 2019-09-13 NOTE — Assessment & Plan Note (Signed)
Check lipid panel  Continue daily statin Regular exercise and healthy diet encouraged  

## 2019-09-13 NOTE — Assessment & Plan Note (Signed)
BP well controlled Current regimen effective and well tolerated Continue current medications at current doses cmp  

## 2019-09-13 NOTE — Assessment & Plan Note (Signed)
Taking tylenol regularly Uses voltaren gel Controlled Continue above

## 2019-09-13 NOTE — Assessment & Plan Note (Addendum)
More fatigued, gained weight Check tsh  Titrate med dose if needed

## 2019-09-13 NOTE — Assessment & Plan Note (Addendum)
Anxiety not ideally controlled per patient, but has never truly been controlled Discussed with her that I will not increase her alprazolam We will try higher dose of sertraline-200 mg daily Discussed possibly adding BuSpar-she declined, stating this did not help in the past  Encourage natural ways to help with anxiety

## 2019-09-13 NOTE — Assessment & Plan Note (Signed)
GERD controlled Continue daily medication  

## 2019-09-13 NOTE — Patient Instructions (Signed)
  Tests ordered today. Your results will be released to [place] (or called to you) after review.  If any changes need to be made, you will be notified at that same time.   Medications reviewed and updated.  Changes include :   Increase sertraline to 200 mg nightly  Your prescription(s) have been submitted to your pharmacy. Please take as directed and contact our office if you believe you are having problem(s) with the medication(s).    Please followup in 6 months

## 2019-09-13 NOTE — Assessment & Plan Note (Addendum)
Getting 6 hrs of with seroquel Takes a little while to fall asleep Discussed that I will not add any additional medication for sleep Continue seroquel at current dose

## 2019-09-15 IMAGING — CR DG CHEST 2V
2 series · 2 of 2 positions shown · non-contrast
Comparison: February 10, 2018

CLINICAL DATA: Leg weakness.

EXAM:
CHEST - 2 VIEW

[w chest lat]
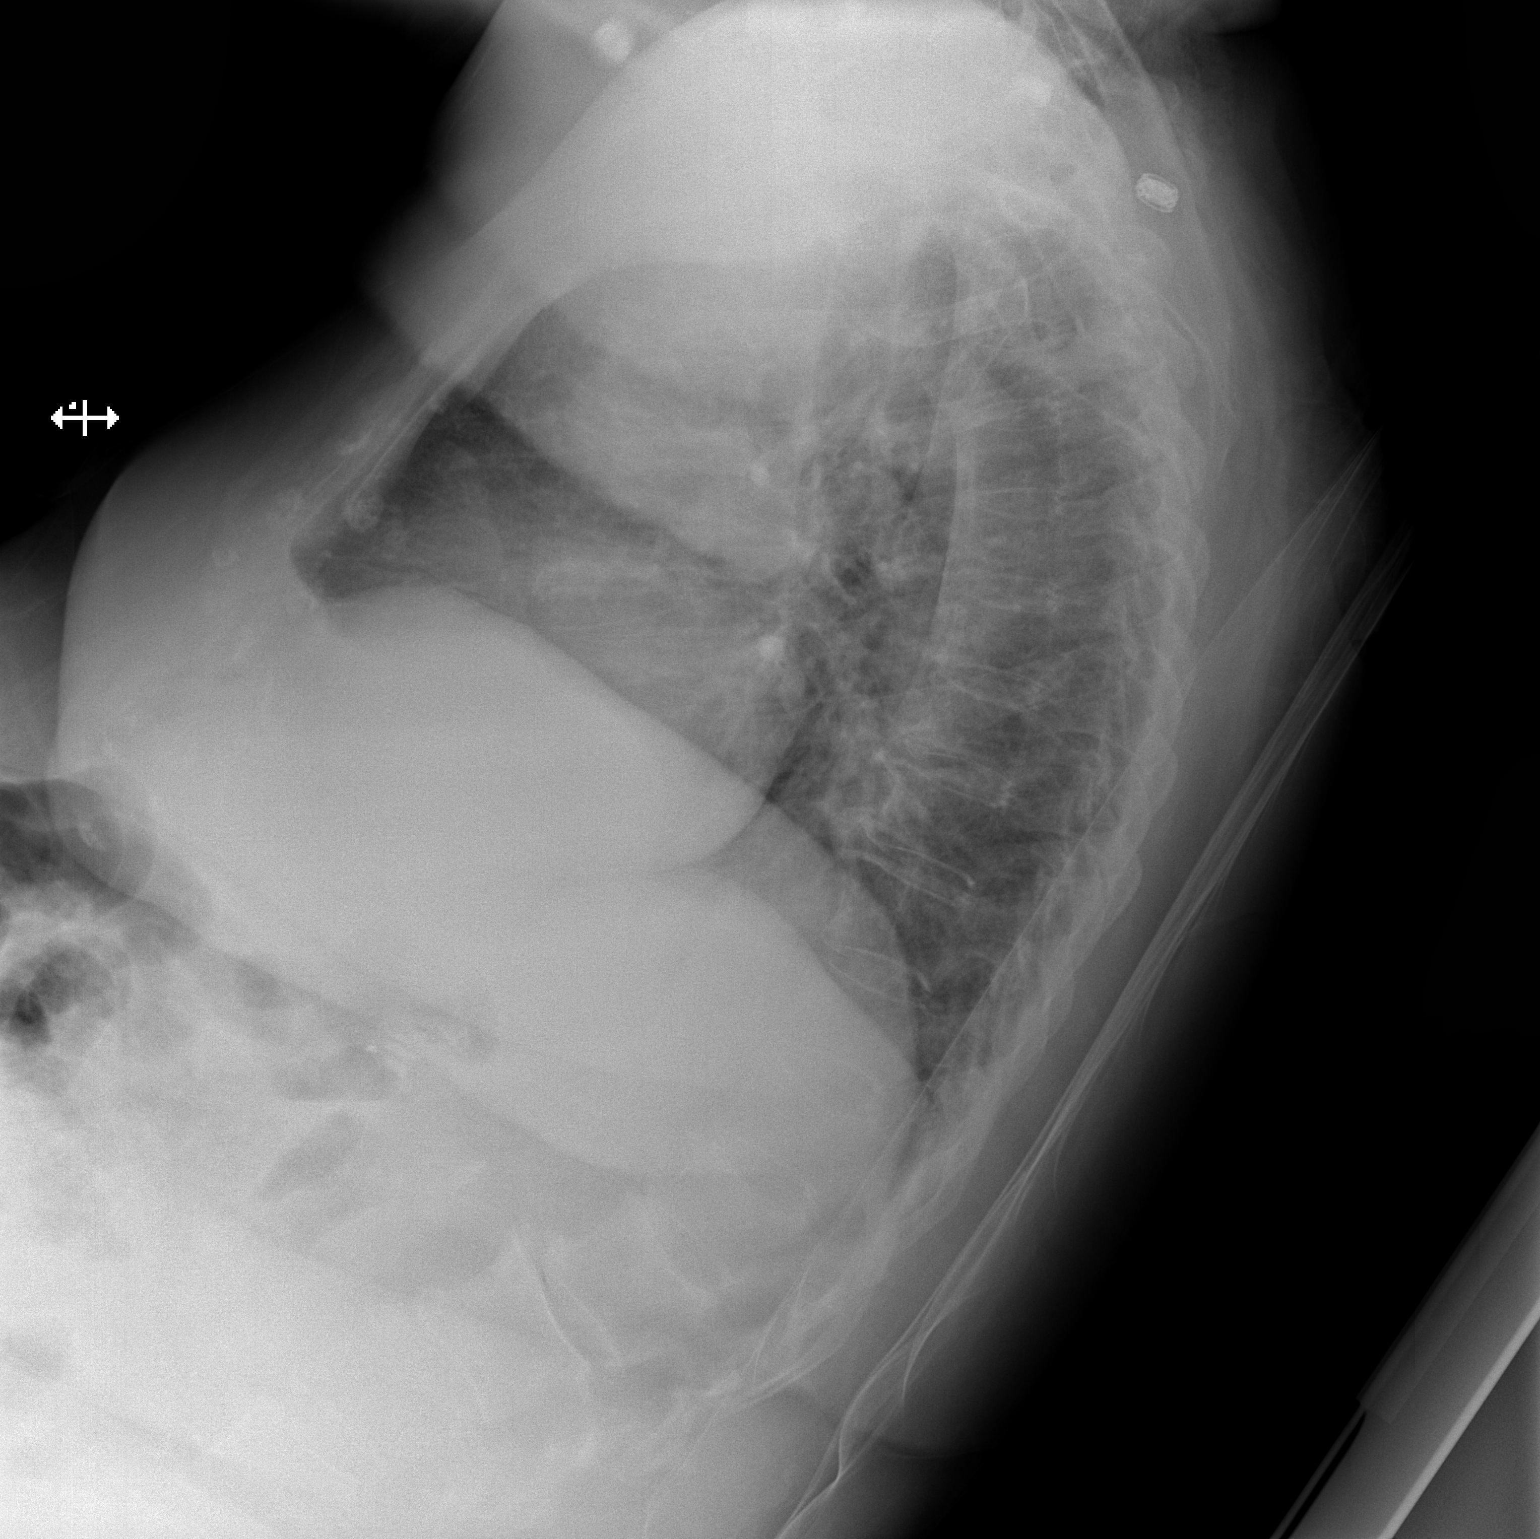

[x chest ap]
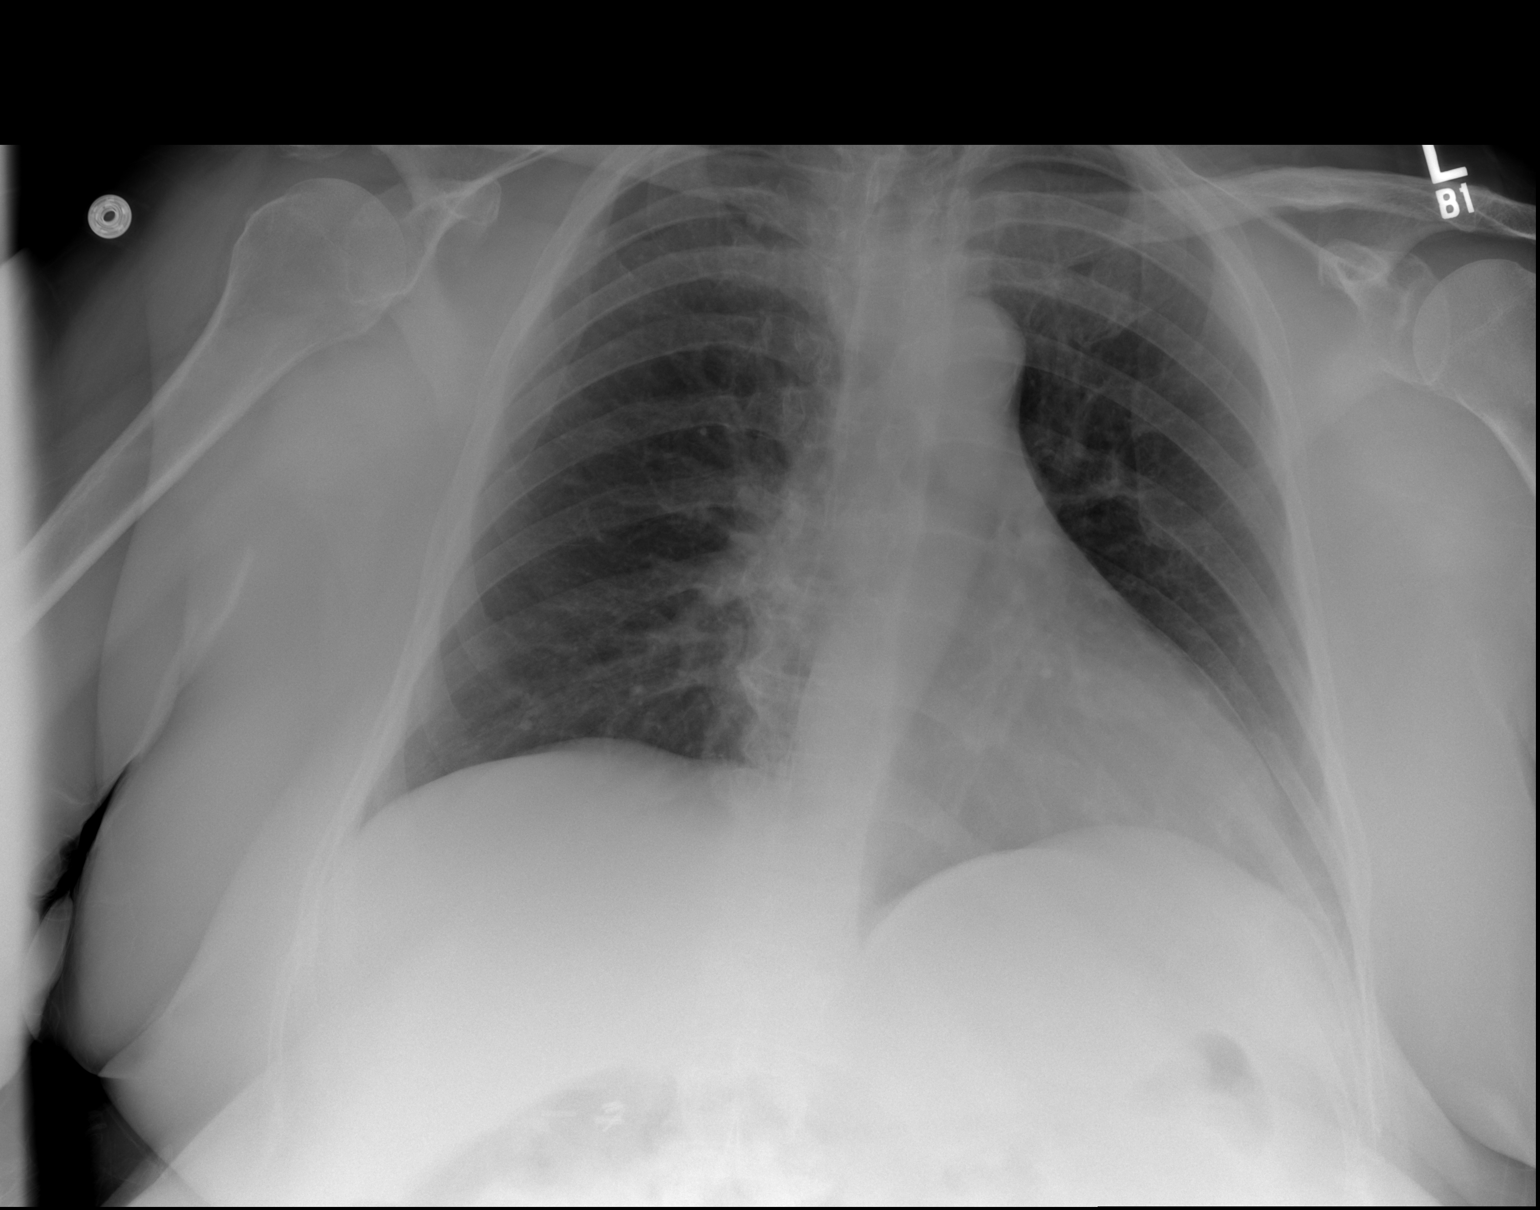

[2 of 2 positions shown; findings below may reference images not displayed]

FINDINGS: The heart size and mediastinal contours are within normal limits.
Both lungs are clear. The visualized skeletal structures are
unremarkable.
IMPRESSION: No active cardiopulmonary disease.

## 2019-09-15 IMAGING — CR DG HIP (WITH OR WITHOUT PELVIS) 2-3V*L*
3 series · 3 of 3 positions shown · non-contrast
Comparison: None.

CLINICAL DATA: Left hip pain after fall.

EXAM:
DG HIP (WITH OR WITHOUT PELVIS) 2-3V LEFT

[t hip ap left (1 of 2)]
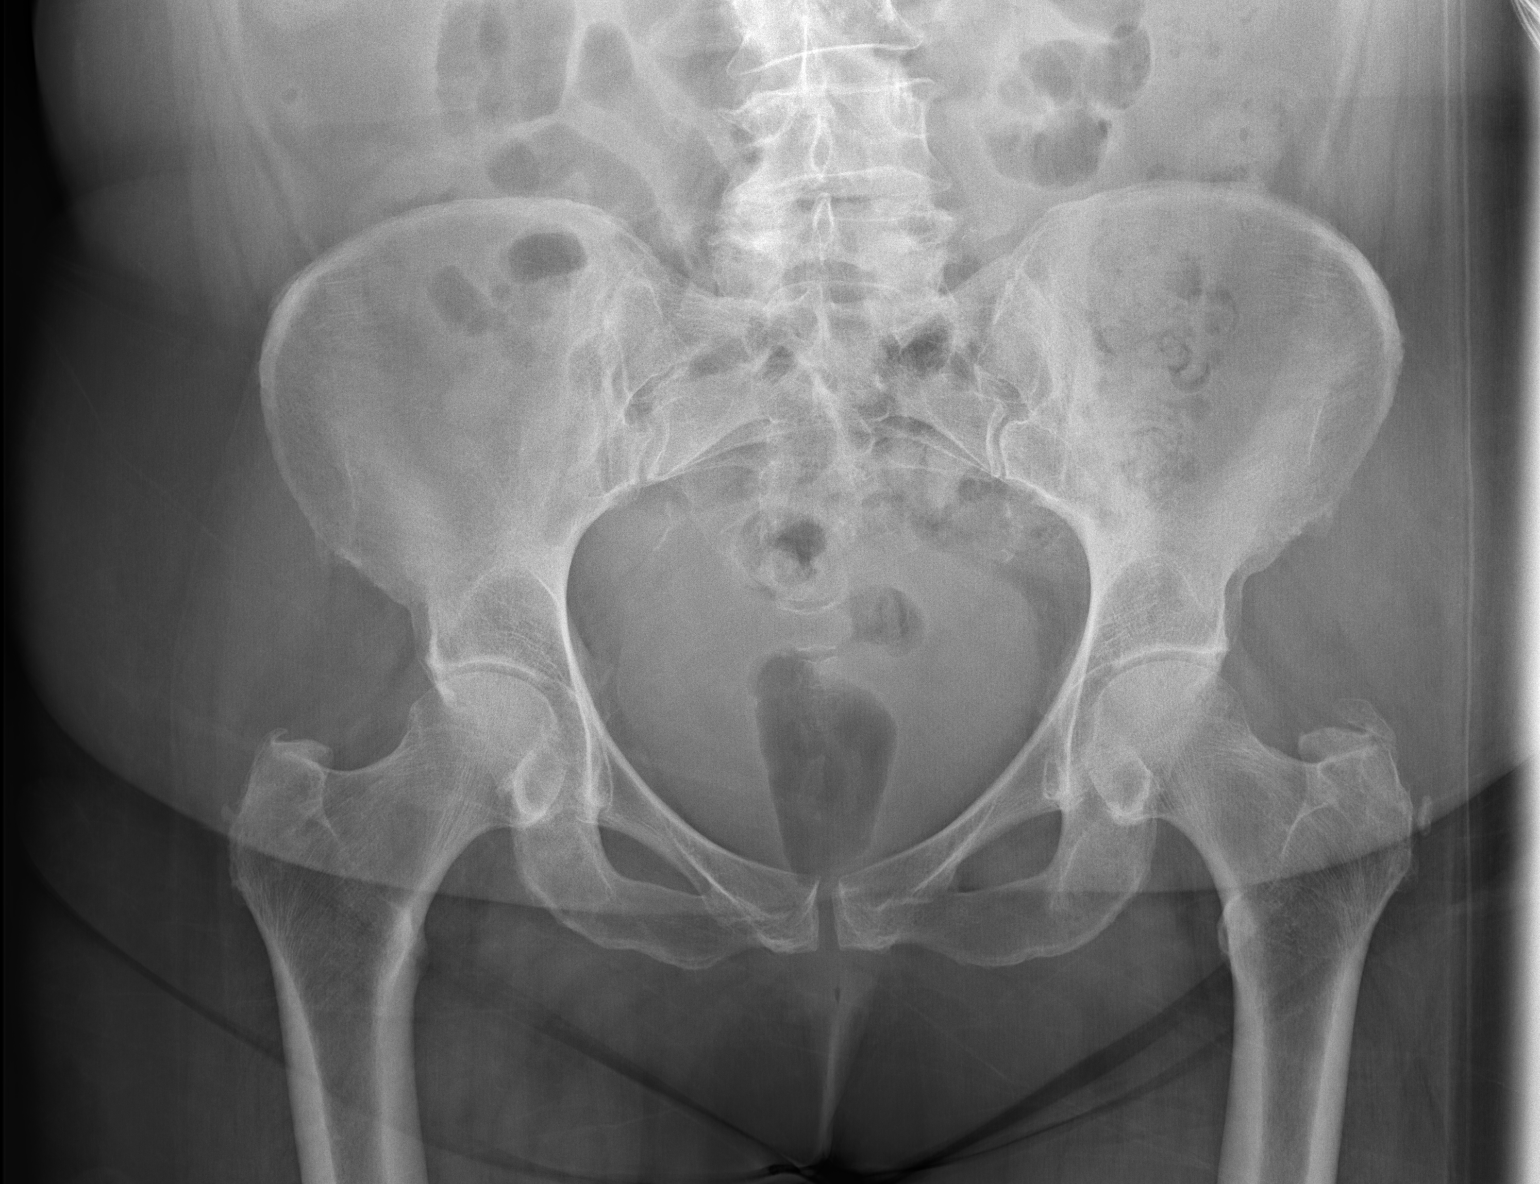

[t hip ap left (2 of 2)]
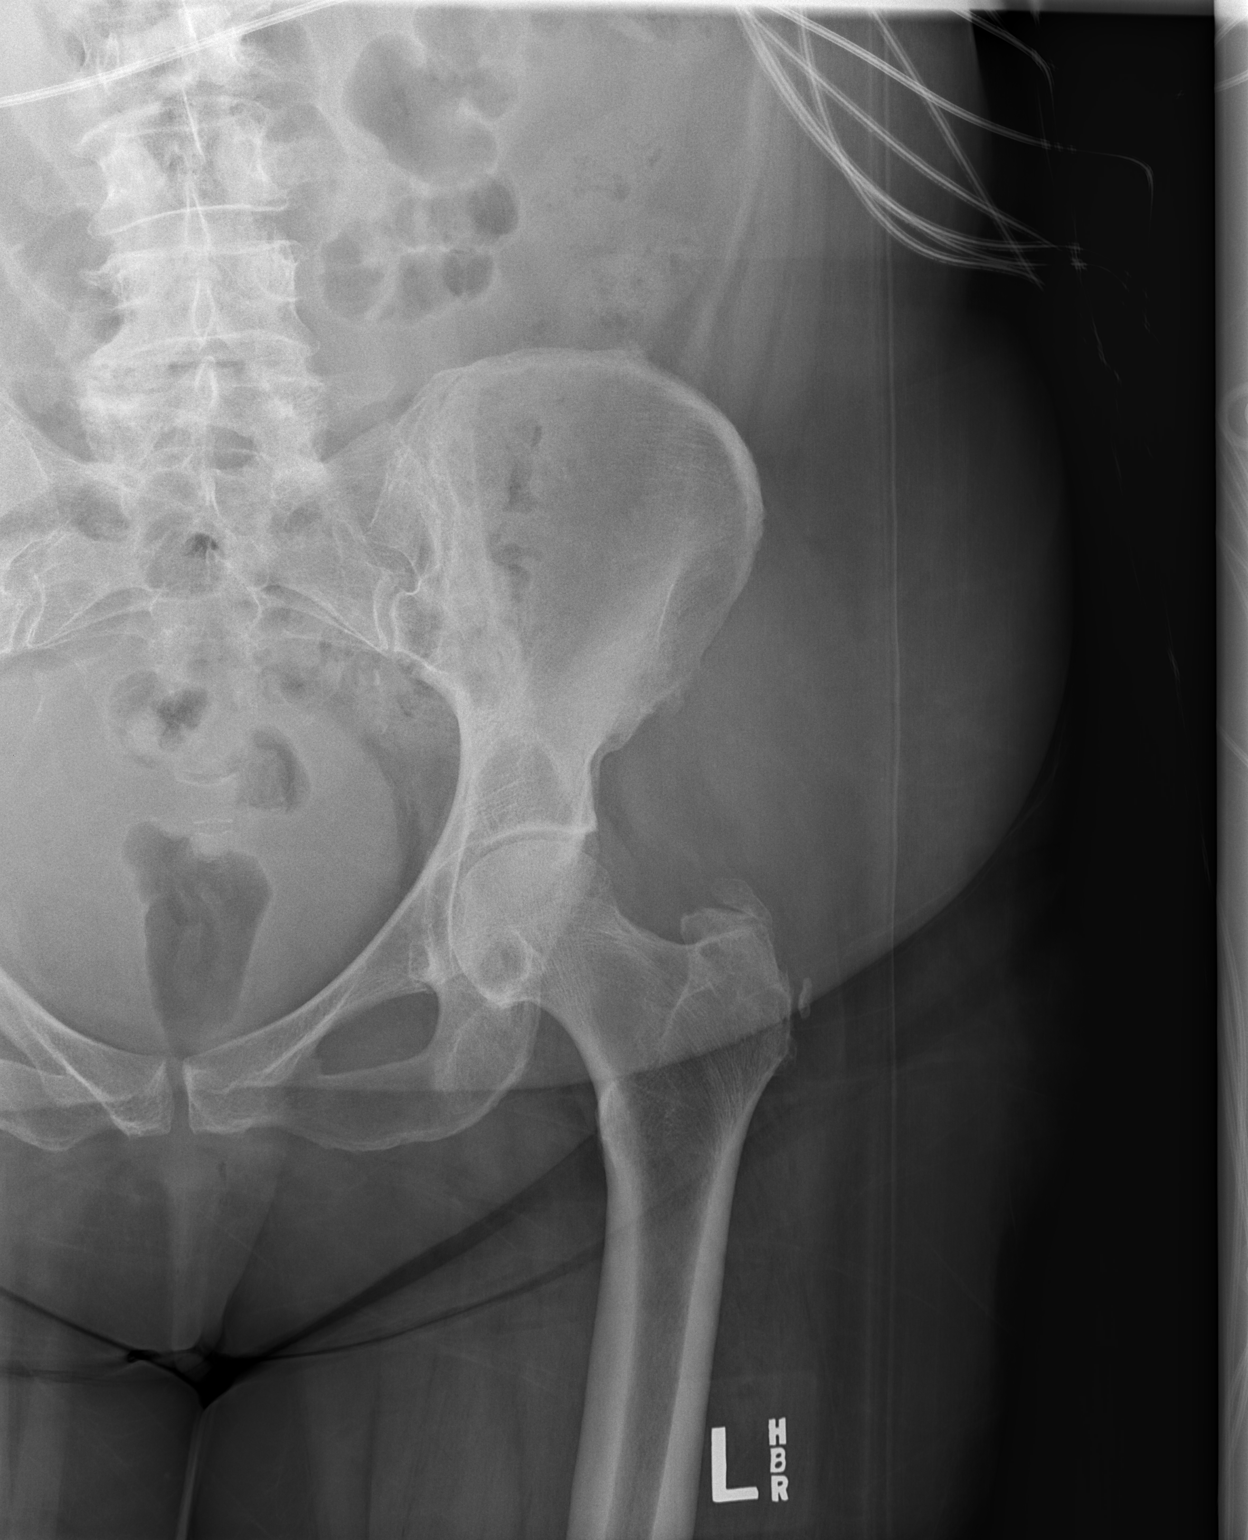

[t hip frog leg left]
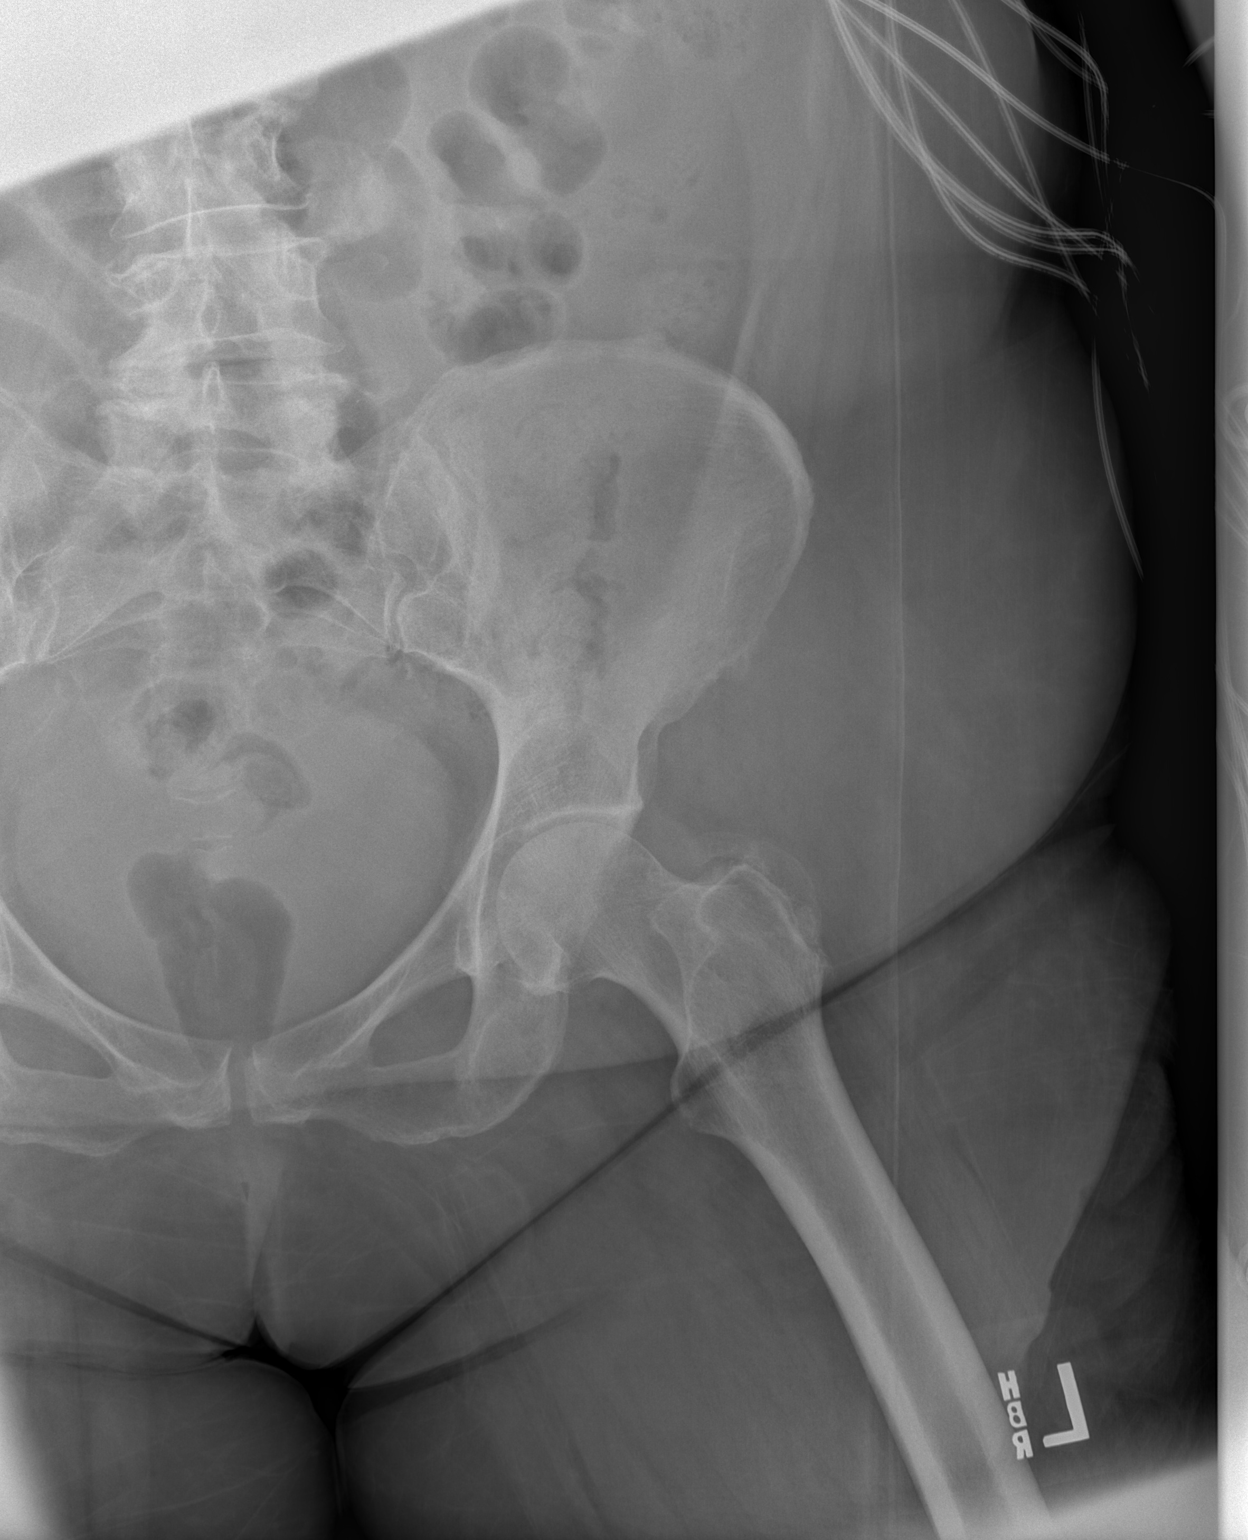

[3 of 3 positions shown; findings below may reference images not displayed]

FINDINGS: Degenerative disc disease L4-5 with facet arthropathy L4-5 and
L5-S1. Intact bony pelvis. Slight joint space narrowing of both
hips. Gluteal calcific tendinopathy is seen bilaterally. No fracture
or suspicious osseous lesions.
IMPRESSION: Lower lumbar degenerative disc and facet arthropathy. Mild
degenerative joint space narrowing of both hips. Bilateral gluteal
calcific tendinopathy is noted.

## 2019-09-15 IMAGING — CT CT HEAD W/O CM
3 series · 15 of 47 positions shown, 18 images · non-contrast
Comparison: CT brain scan of 05/09/2012

CLINICAL DATA: Frequent falls, ataxia, leg weakness, now with right
hip pain

EXAM:
CT HEAD WITHOUT CONTRAST
TECHNIQUE: Contiguous axial images were obtained from the base of the skull
through the vertex without intravenous contrast.

[Series 2: head wo · axial · 0.47mm/px · z∈[-116,+9]mm · 9 of 30 slices shown, 12 images]
[im 3/30  brain]
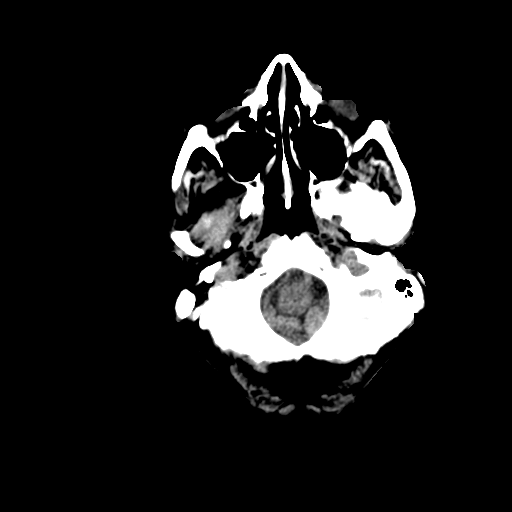
[im 3/30  bone]
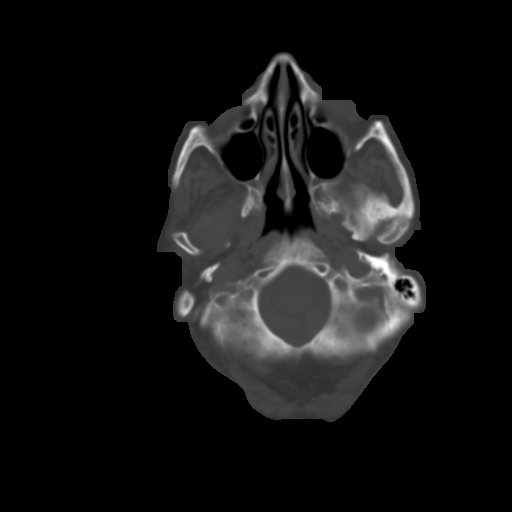
[im 6/30  brain]
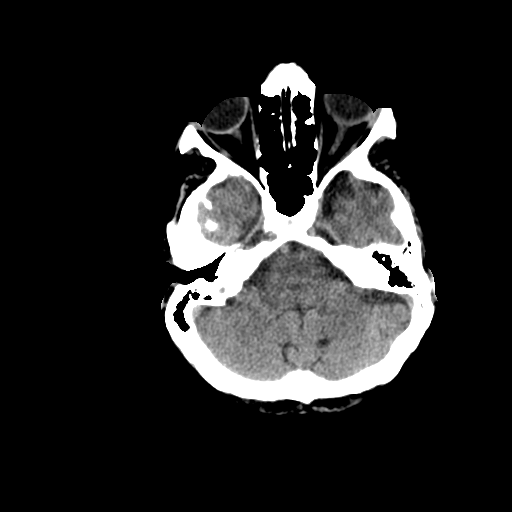
[im 9/30  brain]
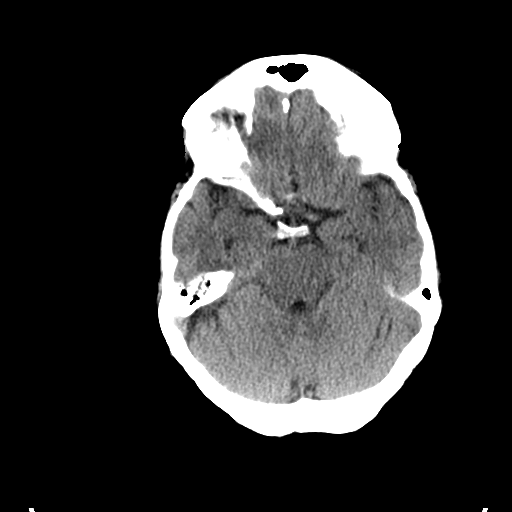
[im 12/30  brain]
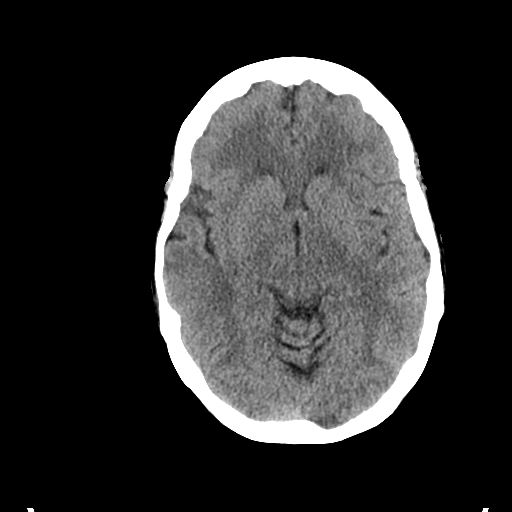
[im 16/30  brain]
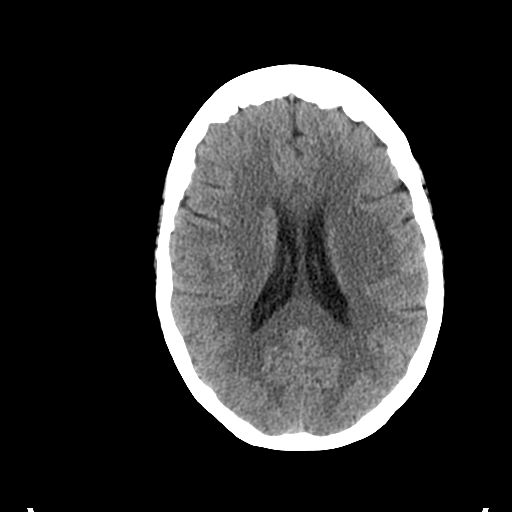
[im 16/30  bone]
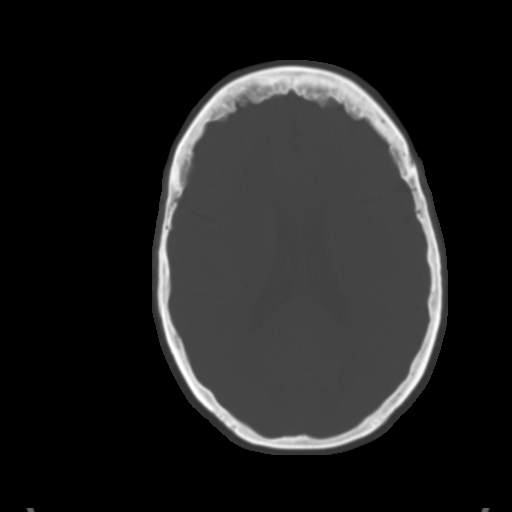
[im 19/30  brain]
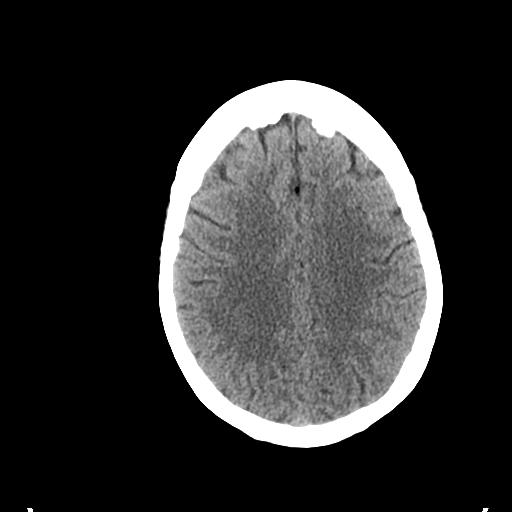
[im 22/30  brain]
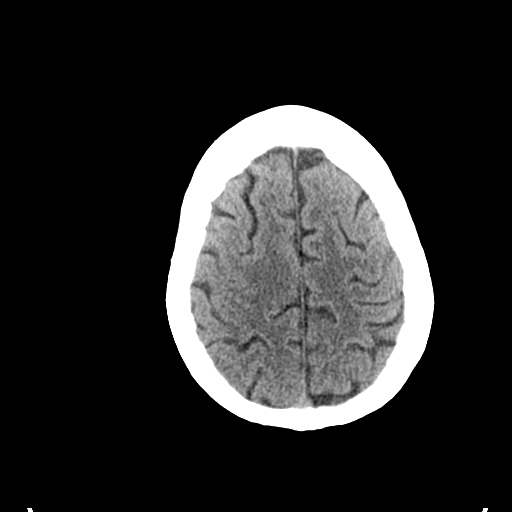
[im 25/30  brain]
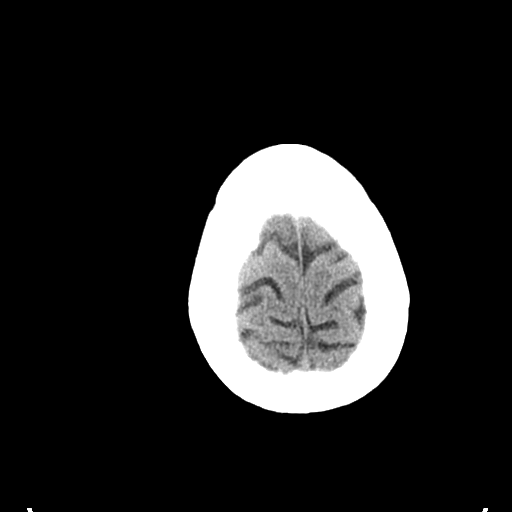
[im 28/30  brain]
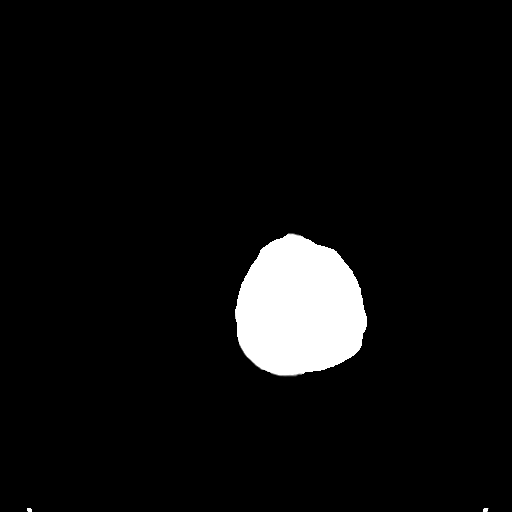
[im 28/30  bone]
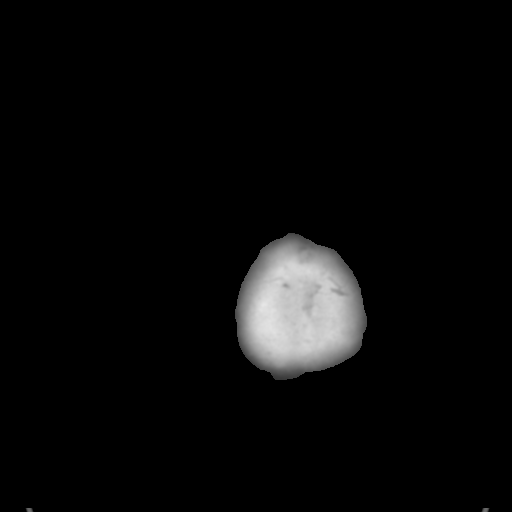

[Series 4: coronal soft tissue · coronal · 0.29mm/px · 3 of 64 slices shown]
[im 22/64  brain]
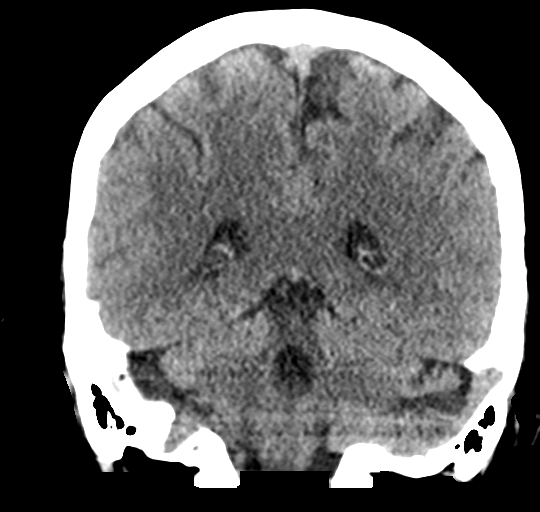
[im 29/64  brain]
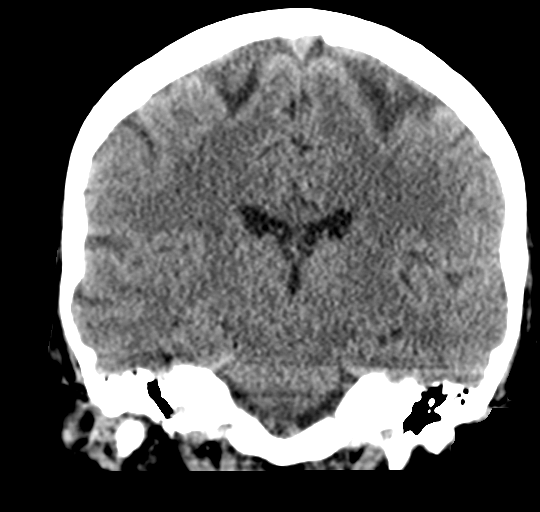
[im 36/64  brain]
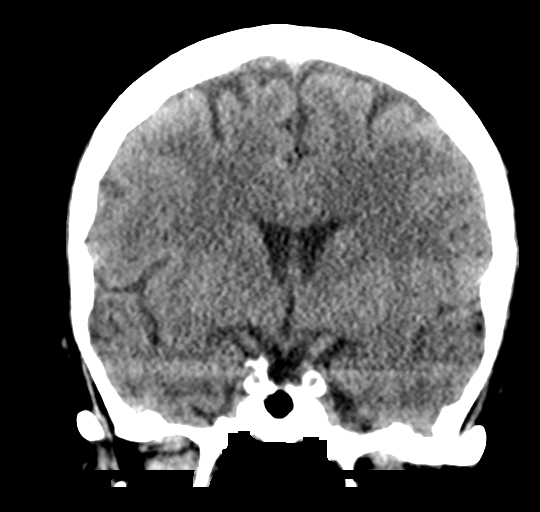

[Series 5: sagittal soft tissue · sagittal · 0.29mm/px · 3 of 51 slices shown]
[im 17/51  brain]
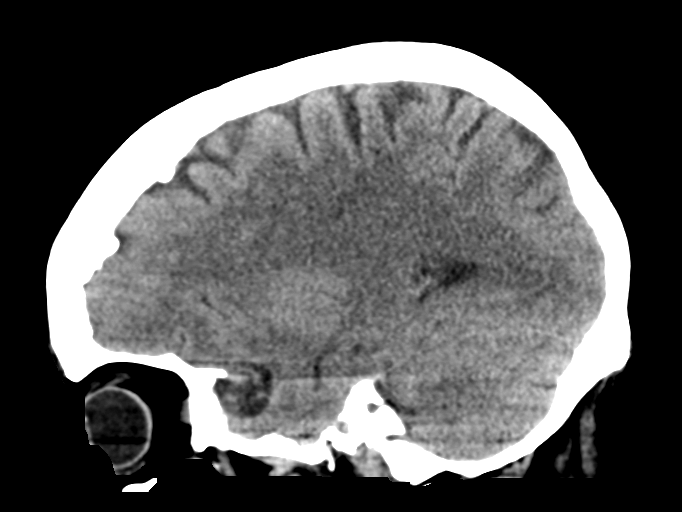
[im 26/51  brain]
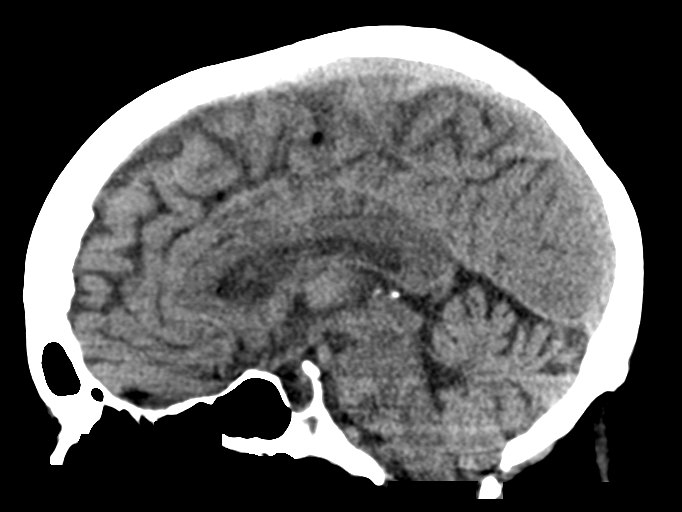
[im 34/51  brain]
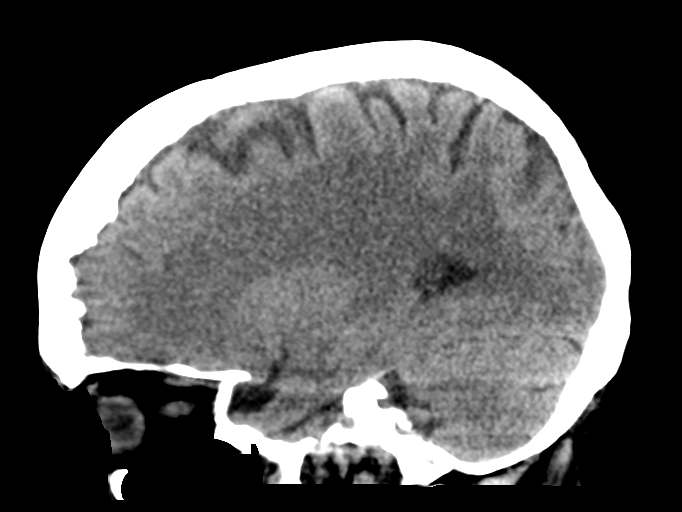

[15 of 47 positions shown; findings below may reference images not displayed]

FINDINGS: Brain: The ventricular system is within normal limits in size and
configuration for age and the cortical sulci are unremarkable. Very
little cortical atrophy is seen for age. The septum remains midline
in position. The fourth ventricle and basilar cisterns are
unremarkable. No hemorrhage, mass lesion, or acute infarction is
seen.

Vascular: No vascular abnormality is noted on this unenhanced study.

Skull: On bone window images, no acute calvarial abnormality is
seen.

Sinuses/Orbits: The paranasal sinuses appear well pneumatized.

Other: None.
IMPRESSION: Negative unenhanced CT of the brain. Very little atrophy is present
considering the patient's age.

## 2019-09-16 IMAGING — US US RENAL
1 series · 14 of 25 positions shown · non-contrast
Comparison: None.

CLINICAL DATA: Acute kidney injury.

EXAM:
RENAL / URINARY TRACT ULTRASOUND COMPLETE

[Series 1: us renal · 14 of 34 slices shown]
[im 1/34]
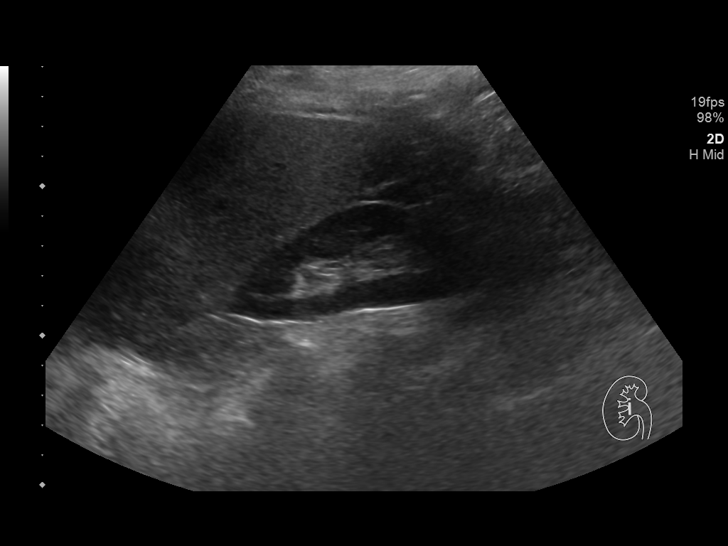
[im 3/34]
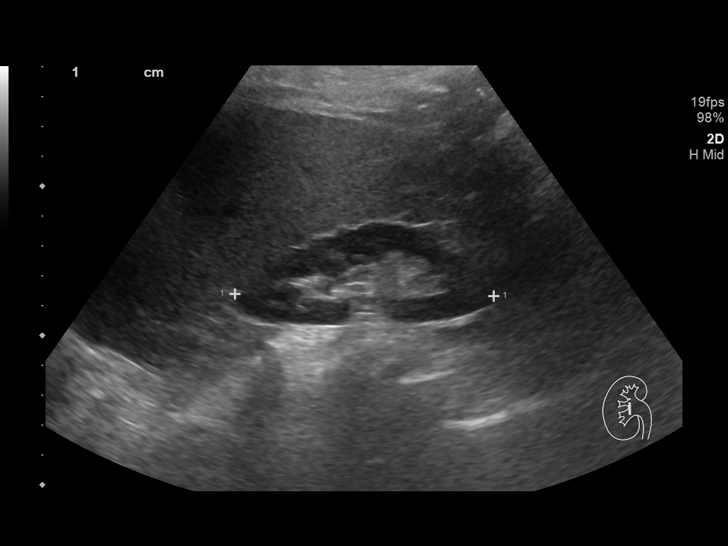
[im 6/34]
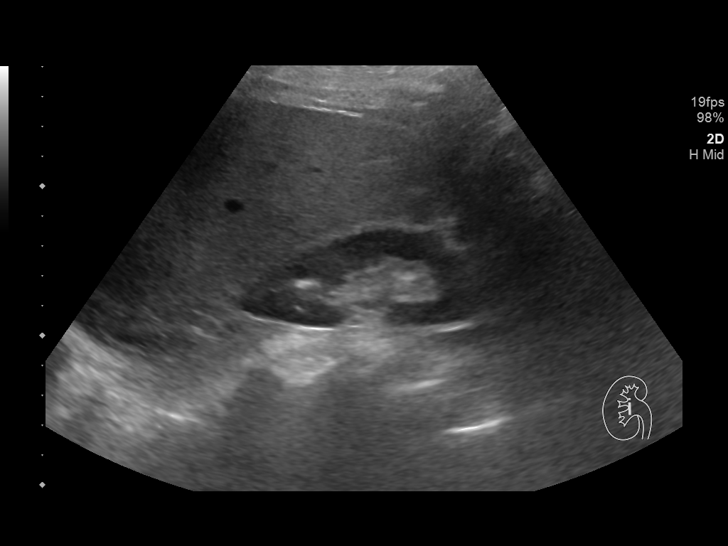
[im 9/34]
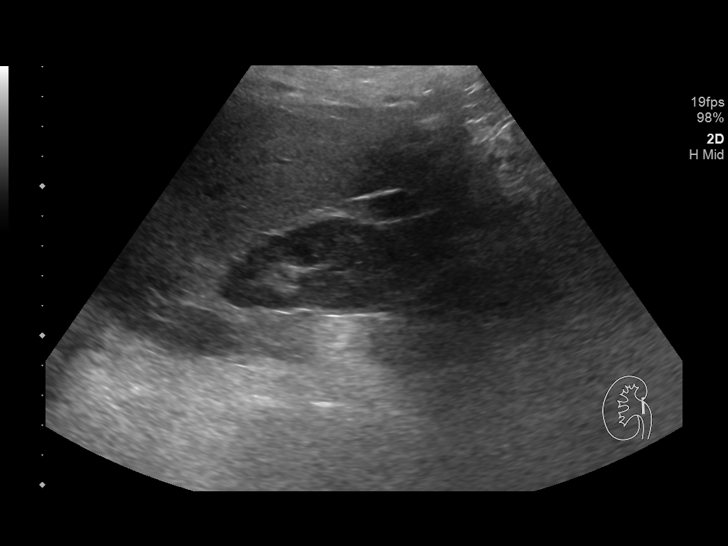
[im 12/34]
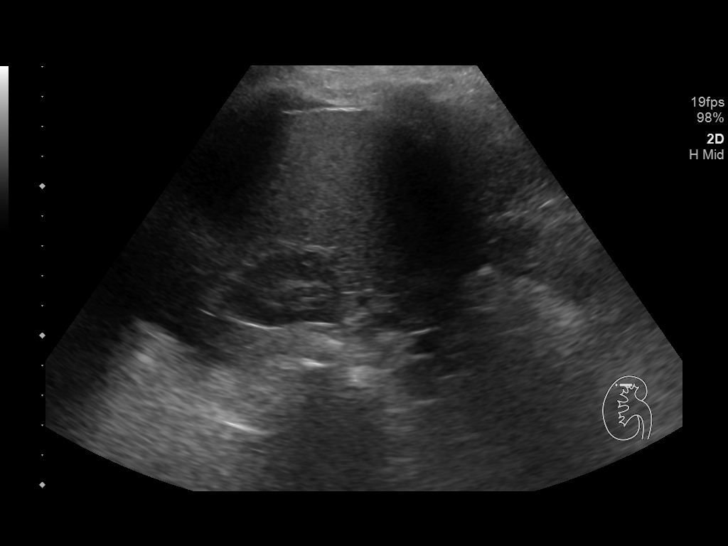
[im 13/34]
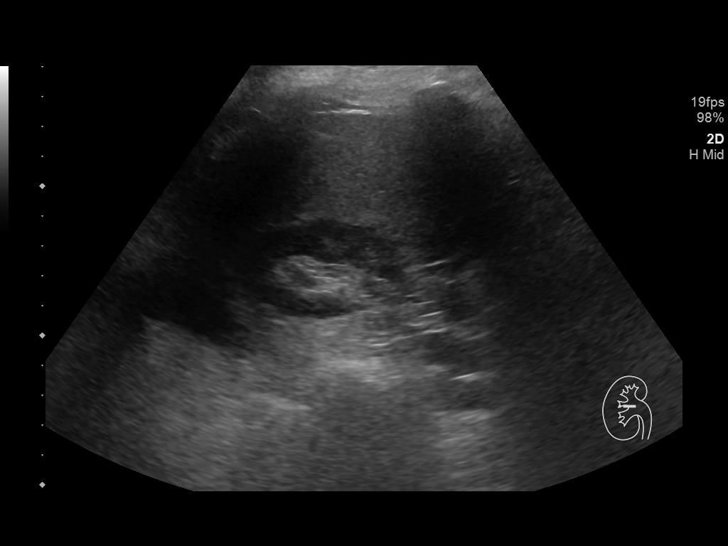
[im 16/34]
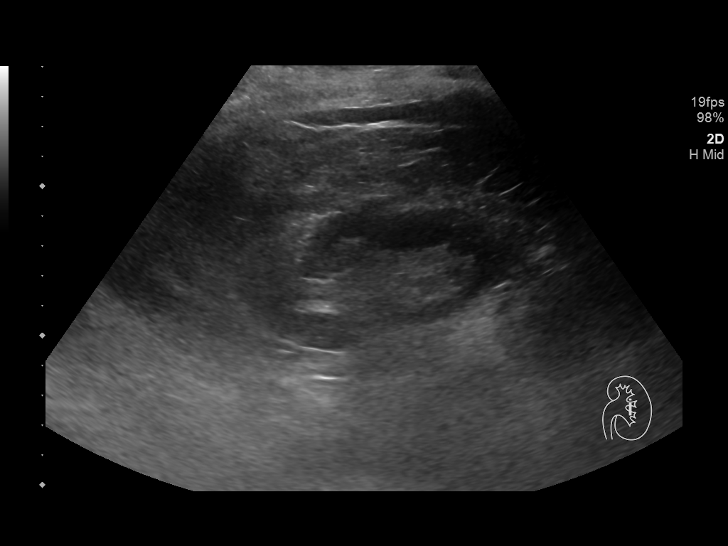
[im 18/34]
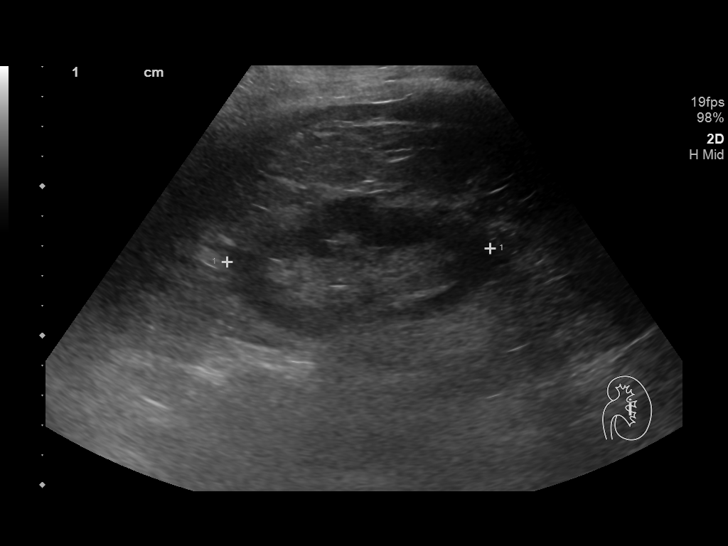
[im 21/34]
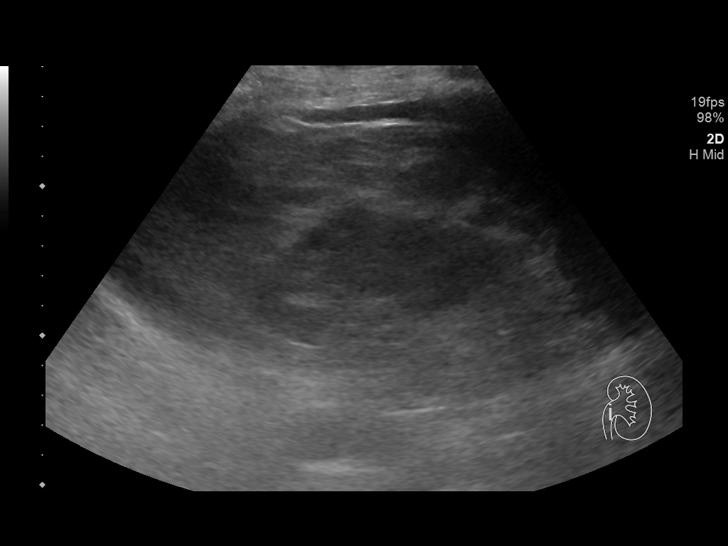
[im 23/34]
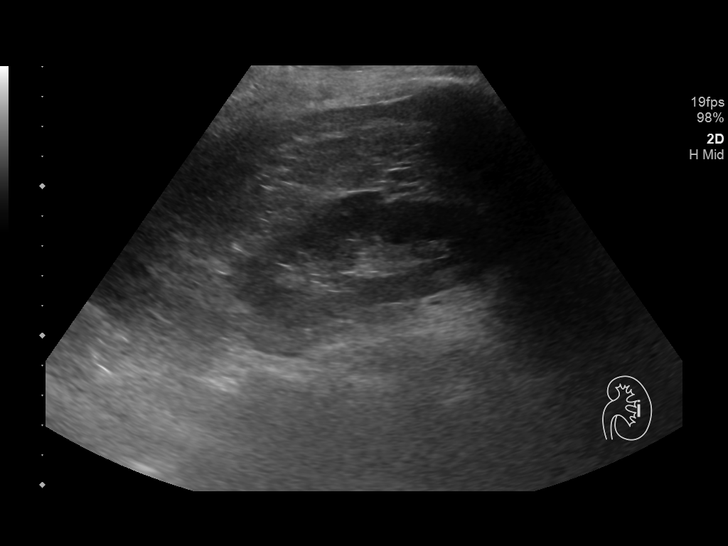
[im 25/34]
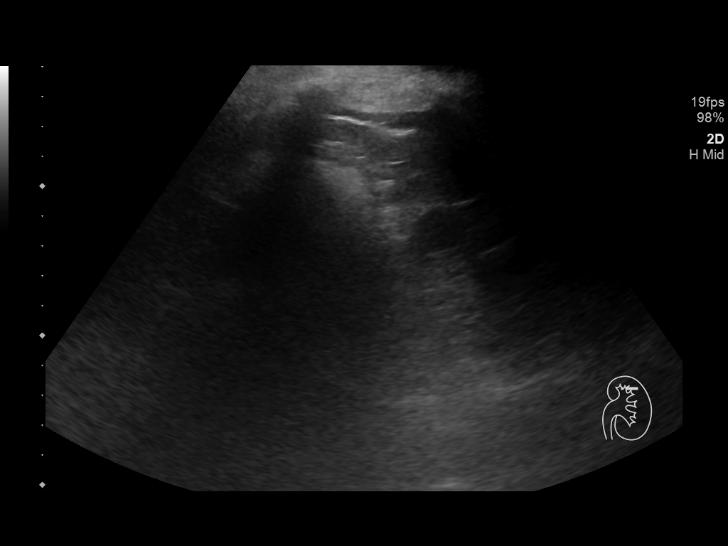
[im 28/34]
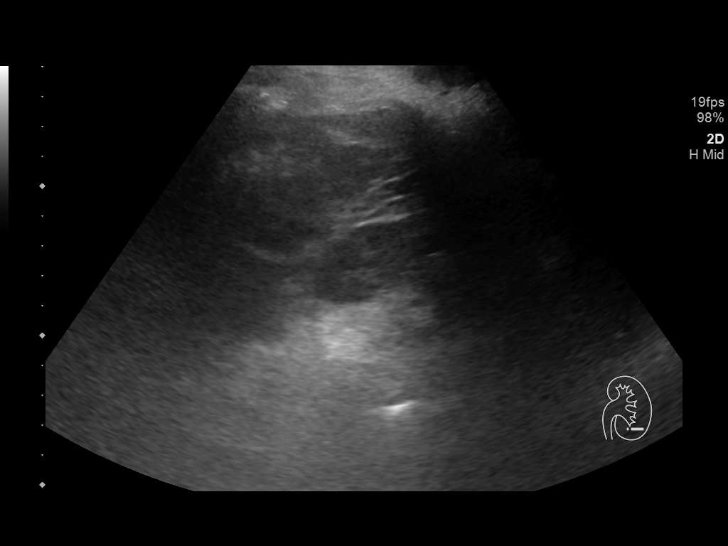
[im 31/34]
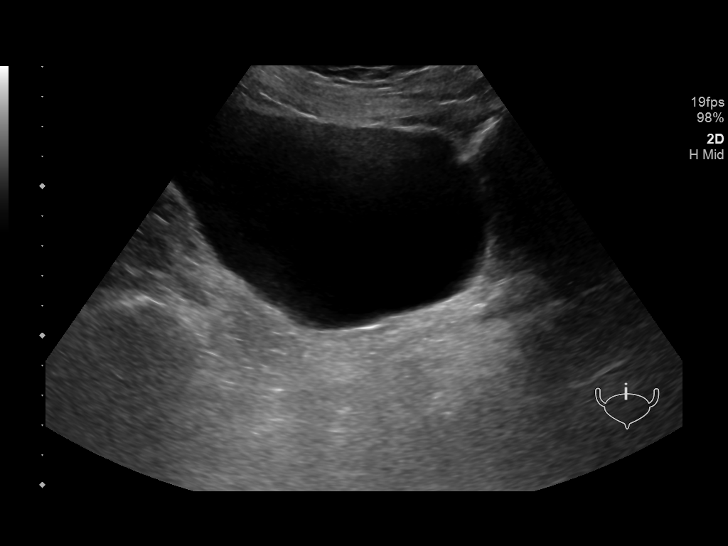
[im 34/34]
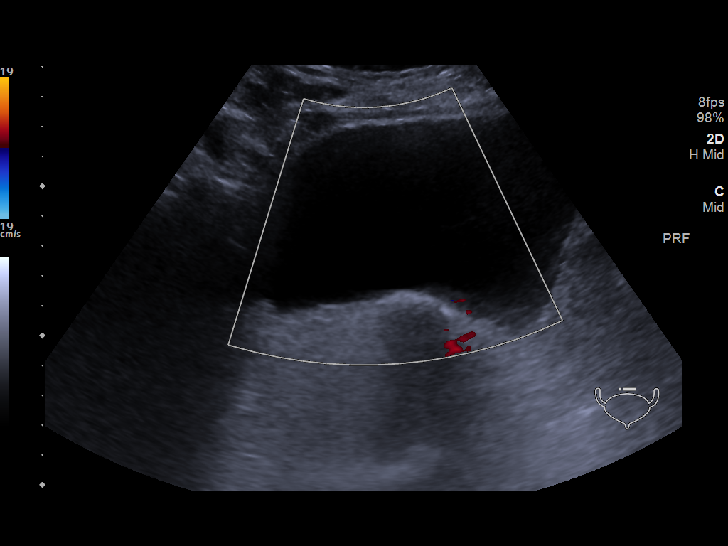

[14 of 25 positions shown; findings below may reference images not displayed]

FINDINGS: Right Kidney:

Length: 8.7 cm. Echogenicity within normal limits. No mass or
hydronephrosis visualized.

Left Kidney:

Length: 8.8 cm. Echogenicity within normal limits. No mass or
hydronephrosis visualized.

Bladder:

Appears normal for degree of bladder distention.
IMPRESSION: Normal ultrasound appearance of the kidneys.  No hydronephrosis.

## 2019-09-18 ENCOUNTER — Other Ambulatory Visit: Payer: Self-pay | Admitting: Internal Medicine

## 2019-09-18 NOTE — Telephone Encounter (Signed)
Copied from [place] [phone]. Topic: Quick Communication - Rx Refill/Question >> Sep 18, 2019  8:38 AM Rainey Pines A wrote: Medication: ALPRAZolam Duanne Moron) 0.5 MG tablet (Patient has 3 pills left.)  Has the patient contacted their pharmacy? Yes (Agent: If no, request that the patient contact the pharmacy for the refill.) (Agent: If yes, when and what did the pharmacy advise?)Contact PCP  Preferred Pharmacy (with phone number or street name): [place], [place]  Phone:  [phone] Fax:  [phone]     Agent: Please be advised that RX refills may take up to 3 business days. We ask that you follow-up with your pharmacy.

## 2019-09-18 NOTE — Telephone Encounter (Signed)
Requested medication (s) are due for refill today: yes  Requested medication (s) are on the active medication list: yes  Last refill:  08/23/2019  Future visit scheduled:yes  Notes to clinic:  refill cannot be delegated    Requested Prescriptions  Pending Prescriptions Disp Refills   ALPRAZolam (XANAX) 0.5 MG tablet 90 tablet 0    Sig: TAKE 1 TABLET BY MOUTH THREE TIMES DAILY AS NEEDED FOR ANXIETY . DO NOT EXCEED 3 PER 24 HOURS      Not Delegated - Psychiatry:  Anxiolytics/Hypnotics Failed - 09/18/2019  8:50 AM      Failed - This refill cannot be delegated      Failed - Urine Drug Screen completed in last 360 days.      Passed - Valid encounter within last 6 months    Recent Outpatient Visits           5 days ago Essential hypertension   [place], Claudina Lick, MD   3 months ago Lumbar spine pain   [place], [place], DO   6 months ago Essential hypertension   [place], MD   10 months ago Bilateral leg and foot pain   [place], [place], DO   11 months ago Essential hypertension   [place], MD       Future Appointments             In 5 months Burns, Claudina Lick, MD [place], [facility]

## 2019-09-19 MED ORDER — ALPRAZOLAM 0.5 MG PO TABS: ORAL_TABLET | ORAL | 0 refills | Status: DC

## 2019-09-19 NOTE — Telephone Encounter (Signed)
Last RF 08/23/19 Last OV 09/13/19 Next OV 03/13/20

## 2019-10-03 ENCOUNTER — Telehealth: Payer: Self-pay

## 2019-10-03 NOTE — Telephone Encounter (Signed)
Patient scheduled on Monday to see Dr. Georgina Snell.

## 2019-10-03 NOTE — Telephone Encounter (Signed)
Patient called and stated that her back is not doing any better that the shots she got at her last visit did not help. Patient stated that she was told she would not be able to get another steroid injection that she would have to get a injection in her spine. I did not see that in the chart so I was not sure what exactly she was talking about.

## 2019-10-08 ENCOUNTER — Encounter: Payer: Self-pay | Admitting: Family Medicine

## 2019-10-08 ENCOUNTER — Other Ambulatory Visit: Payer: Self-pay

## 2019-10-08 ENCOUNTER — Ambulatory Visit (INDEPENDENT_AMBULATORY_CARE_PROVIDER_SITE_OTHER): Payer: Medicare Other | Admitting: Family Medicine

## 2019-10-08 ENCOUNTER — Ambulatory Visit (INDEPENDENT_AMBULATORY_CARE_PROVIDER_SITE_OTHER): Payer: Medicare Other

## 2019-10-08 VITALS — BP 170/110 | HR 72 | Ht 65.0 in | Wt 210.4 lb

## 2019-10-08 DIAGNOSIS — M545 Low back pain, unspecified: Secondary | ICD-10-CM

## 2019-10-08 DIAGNOSIS — M25552 Pain in left hip: Secondary | ICD-10-CM | POA: Diagnosis not present

## 2019-10-08 NOTE — Progress Notes (Signed)
I, Wendy Poet, LAT, ATC, am serving as scribe for Dr. Lynne Leader.  Dawn Kim is a 74 y.o. female who presents to [place] at [facility] today for f/u of low back pain.  Pt was last seen by Dr. Tamala Julian on 06/13/19 and noted LBP x 6-7 months that was constant in nature and aggravated w/ lumbar flexion.  Pt was given a ketorolac and Depo 80 IM injection, provided w/ a HEP and had an L-spine and pelvis XR.    Since her last visit, pt reports that she had fell when she was trying to put a new mattress on her bed and hit her L side against a nightstand.  She is having pain in her L lower back, L lateral hip and L lateral thigh that she rates as a 9/10 that she describes as continuous.  She also notes swelling in her l ankle and lower leg.  She denies any numbness/tingling.  Aggravating factors include transitioning from sitting-to-stand and forward flexion.  Pt has tried Voltaren gel and Tylenol Xtra strength.  She notes that she is doing pretty well with minimal pain prior to the fall recently.    ROS:  As above  Exam:  BP (!) 170/110 (BP Location: Left Arm, Patient Position: Sitting, Cuff Size: Normal)   Pulse 72   Ht 5\' 5"  (1.651 m)   Wt 210 lb 6.4 oz (95.4 kg)   SpO2 95%   BMI 35.01 kg/m  Wt Readings from Last 5 Encounters:  10/08/19 210 lb 6.4 oz (95.4 kg)  09/13/19 202 lb 12.8 oz (92 kg)  06/13/19 198 lb (89.8 kg)  03/05/19 193 lb 12.8 oz (87.9 kg)  11/20/18 194 lb (88 kg)   General: Well Developed, well nourished, and in no acute distress.  Neuro/Psych: Alert and oriented x3, extra-ocular muscles intact, able to move all 4 extremities, sensation grossly intact. Skin: Warm and dry, no rashes noted.  Respiratory: Not using accessory muscles, speaking in full sentences, trachea midline.  Cardiovascular: Pulses palpable, no extremity edema. Abdomen: Does not appear distended. MSK:  L-spine: Tender palpation at L-spine around L4-5 at midline.  Tender  palpation bilateral lumbar paraspinal musculature. Lumbar motion decreased.  Left hip: Normal-appearing.  Mildly tender palpation greater trochanter.  Hip motion full.  Tender palpation lateral hip.     Lab and Radiology Results  X-ray images L-spine obtained today personally independently reviewed No acute fractures.  Degenerative changes similar to described in x-ray L-spine September 2020. Await formal radiology review    Assessment and Plan: 74 y.o. female with low back pain and left lateral hip pain occurring for about a week and a half after fall.  Fortunately no obvious fracture per my read however radiology overread is still pending.  Diagnosis is lumbosacral strain and left trochanteric bursitis.  Plan to treat with physical therapy Tylenol and Voltaren gel.  Avoid sedating medication given history of confusion and sedation with multiple conflicting medications.  Patient has existing prescription of Xanax which she takes for anxiety  Check back with me in 2 to 4 weeks.  Return sooner if needed.  Precautions reviewed.[name]   PDMP reviewed during this encounter. Orders Placed This Encounter  Procedures  . DG Lumbar Spine Complete    Standing Status:   Future    Number of Occurrences:   1    Standing Expiration Date:   12/05/2020    Order Specific Question:   Reason for Exam (SYMPTOM  OR DIAGNOSIS  REQUIRED)    Answer:   eval low back pain after fall    Order Specific Question:   Preferred imaging location?    Answer:   [facility]    Order Specific Question:   Radiology Contrast Protocol - do NOT remove file path    Answer:   [email or web address]  . Ambulatory referral to Physical Therapy    Referral Priority:   Routine    Referral Type:   Physical Medicine    Referral Reason:   Specialty Services Required    Requested Specialty:   Physical Therapy   No orders of the defined types were placed in this encounter.   Historical  information moved to improve visibility of documentation.  Past Medical History:  Diagnosis Date  . Anxiety   . Cataract    Bil  . Depression   . Hypercholesteremia   . Hypertension   . Thyroid disease    Past Surgical History:  Procedure Laterality Date  . CHOLECYSTECTOMY    . COLONOSCOPY    . FOOT SURGERY  2011   right foot  . JOINT REPLACEMENT  2010   left knee  . POLYPECTOMY    . REPLACEMENT TOTAL KNEE Left   . TONSILLECTOMY     Social History   Tobacco Use  . Smoking status: Former Smoker    Types: Cigarettes    Quit date: 11/04/2009    Years since quitting: 9.9  . Smokeless tobacco: Never Used  Substance Use Topics  . Alcohol use: No   family history includes Drug abuse in her sister; Heart disease in her mother.  Medications: Current Outpatient Medications  Medication Sig Dispense Refill  . ALPRAZolam (XANAX) 0.5 MG tablet TAKE 1 TABLET BY MOUTH THREE TIMES DAILY AS NEEDED FOR ANXIETY . DO NOT EXCEED 3 PER 24 HOURS 90 tablet 0  . calcium carbonate (OS-CAL - DOSED IN MG OF ELEMENTAL CALCIUM) 1250 (500 Ca) MG tablet Take 1 tablet (500 mg of elemental calcium total) by mouth daily.    . cholecalciferol (VITAMIN D) 1000 UNITS tablet Take 1,000 Units by mouth daily.    . cilostazol (PLETAL) 50 MG tablet Take 0.5 tablets (25 mg total) by mouth 2 (two) times daily. 60 tablet 1  . diclofenac sodium (VOLTAREN) 1 % GEL Apply 4 g topically 4 (four) times daily. 100 g 5  . famotidine (PEPCID) 40 MG tablet TAKE 1 TABLET BY MOUTH AT BEDTIME 90 tablet 0  . ferrous sulfate 325 (65 FE) MG EC tablet Take 325 mg by mouth daily with breakfast.    . fish oil-omega-3 fatty acids 1000 MG capsule Take 1 g by mouth 2 (two) times daily.     [name] levocetirizine (XYZAL) 5 MG tablet TAKE 1 TABLET BY MOUTH ONCE DAILY IN THE EVENING 90 tablet 0  . levothyroxine (SYNTHROID) 112 MCG tablet Take 1 tablet by mouth once daily 90 tablet 1  . Multiple Vitamin (MULTIVITAMIN) tablet Take 1 tablet by mouth  daily.    [name] omeprazole (PRILOSEC) 40 MG capsule TAKE 1 CAPSULE BY MOUTH TWICE DAILY BEFORE A MEAL 180 capsule 0  . QUEtiapine (SEROQUEL) 50 MG tablet TAKE 1 TABLET BY MOUTH AT BEDTIME 90 tablet 0  . ramipril (ALTACE) 10 MG capsule Take 1 capsule by mouth twice daily 180 capsule 0  . sertraline (ZOLOFT) 100 MG tablet Take 2 tablets (200 mg total) by mouth daily. 60 tablet 5  . simvastatin (ZOCOR) 40 MG tablet Take 1 tablet by mouth  in the evening 90 tablet 1   No current facility-administered medications for this visit.   Allergies  Allergen Reactions  . Gabapentin     Caused parkinsonism  . Lunesta [Eszopiclone]     Ataxia, confusion  . Penicillins Hives    Has patient had a PCN reaction causing immediate rash, facial/tongue/throat swelling, SOB or lightheadedness with hypotension: No Has patient had a PCN reaction causing severe rash involving mucus membranes or skin necrosis: Yes  Has patient had a PCN reaction that required hospitalization: No Has patient had a PCN reaction occurring within the last 10 years: No  If all of the above answers are "NO", then may proceed with Cephalosporin use.   Read Drivers [Zaleplon]     Confusion, memory difficulties, ataxia  . Ambien [Zolpidem]     Confusion, hung over feeling  . Amoxicillin Rash    Has patient had a PCN reaction causing immediate rash, facial/tongue/throat swelling, SOB or lightheadedness with hypotension: No  Has patient had a PCN reaction causing severe rash involving mucus membranes or skin necrosis: Yes Has patient had a PCN reaction that required hospitalization No Has patient had a PCN reaction occurring within the last 10 years: No If all of the above answers are "NO", then may proceed with Cephalosporin use.       Discussed warning signs or symptoms. Please see discharge instructions. Patient expresses understanding.  The above documentation has been reviewed and is accurate and complete Lynne Leader

## 2019-10-08 NOTE — Patient Instructions (Signed)
Thank you for coming in today. Attend PT.  Use a heating pad.  Continue tylenol and Voltaren.  Recheck with me in about 2-4 weeks.  Return sooner if needed.    Come back or go to the emergency room if you notice new weakness new numbness problems walking or bowel or bladder problems.

## 2019-10-09 NOTE — Progress Notes (Signed)
X-ray lumbar spine shows no fracture.  Arthritis and scoliosis present as we discussed.  No new changes.[name]

## 2019-10-15 ENCOUNTER — Ambulatory Visit: Payer: Medicare Other | Attending: Internal Medicine

## 2019-10-15 ENCOUNTER — Other Ambulatory Visit: Payer: Self-pay | Admitting: Internal Medicine

## 2019-10-15 DIAGNOSIS — Z23 Encounter for immunization: Secondary | ICD-10-CM | POA: Insufficient documentation

## 2019-10-15 MED ORDER — ALPRAZOLAM 0.5 MG PO TABS: ORAL_TABLET | ORAL | 0 refills | Status: DC

## 2019-10-15 NOTE — Progress Notes (Signed)
   Covid-19 Vaccination Clinic  Name:  Dawn Kim    MRN: [id number] DOB: 27-Mar-1946  10/15/2019  Dawn Kim was observed post Covid-19 immunization for 15 minutes without incidence. She was provided with Vaccine Information Sheet and instruction to access the V-Safe system.   Dawn Kim was instructed to call 911 with any severe reactions post vaccine: [name] Difficulty breathing  . Swelling of your face and throat  . A fast heartbeat  . A bad rash all over your body  . Dizziness and weakness

## 2019-10-15 NOTE — Telephone Encounter (Signed)
Last refill was 09/20/19  Last OV was 09/13/19 Next OV 03/13/20

## 2019-10-15 NOTE — Telephone Encounter (Signed)
Medication Refill - Medication: alprazolam  Has the patient contacted their pharmacy? No. (Agent: If no, request that the patient contact the pharmacy for the refill.) (Agent: If yes, when and what did the pharmacy advise?)  Preferred Pharmacy (with phone number or street name):  [place], [place] [address]  Phone: [phone] Fax: [phone]  Not a 24 hour pharmacy; exact hours not known.     Agent: Please be advised that RX refills may take up to 3 business days. We ask that you follow-up with your pharmacy.

## 2019-10-17 ENCOUNTER — Ambulatory Visit: Payer: Medicare Other | Admitting: Physical Therapy

## 2019-10-20 ENCOUNTER — Other Ambulatory Visit: Payer: Self-pay | Admitting: Internal Medicine

## 2019-10-29 ENCOUNTER — Ambulatory Visit: Payer: Medicare Other | Admitting: Family Medicine

## 2019-11-03 ENCOUNTER — Encounter (HOSPITAL_COMMUNITY): Payer: Self-pay

## 2019-11-03 ENCOUNTER — Emergency Department (HOSPITAL_COMMUNITY)
Admission: EM | Admit: 2019-11-03 | Discharge: 2019-11-03 | Disposition: A | Discharge status: Home / Self Care | Payer: Medicare Other | Attending: Emergency Medicine | Admitting: Emergency Medicine

## 2019-11-03 ENCOUNTER — Emergency Department (HOSPITAL_COMMUNITY): Payer: Medicare Other

## 2019-11-03 ENCOUNTER — Other Ambulatory Visit: Payer: Self-pay

## 2019-11-03 DIAGNOSIS — T148XXA Other injury of unspecified body region, initial encounter: Secondary | ICD-10-CM

## 2019-11-03 DIAGNOSIS — Z87891 Personal history of nicotine dependence: Secondary | ICD-10-CM | POA: Diagnosis not present

## 2019-11-03 DIAGNOSIS — S301XXA Contusion of abdominal wall, initial encounter: Secondary | ICD-10-CM | POA: Diagnosis not present

## 2019-11-03 DIAGNOSIS — Y929 Unspecified place or not applicable: Secondary | ICD-10-CM | POA: Diagnosis not present

## 2019-11-03 DIAGNOSIS — W01190A Fall on same level from slipping, tripping and stumbling with subsequent striking against furniture, initial encounter: Secondary | ICD-10-CM | POA: Insufficient documentation

## 2019-11-03 DIAGNOSIS — Z79899 Other long term (current) drug therapy: Secondary | ICD-10-CM | POA: Insufficient documentation

## 2019-11-03 DIAGNOSIS — S300XXA Contusion of lower back and pelvis, initial encounter: Secondary | ICD-10-CM | POA: Diagnosis not present

## 2019-11-03 DIAGNOSIS — S7002XA Contusion of left hip, initial encounter: Secondary | ICD-10-CM | POA: Insufficient documentation

## 2019-11-03 DIAGNOSIS — R109 Unspecified abdominal pain: Secondary | ICD-10-CM | POA: Insufficient documentation

## 2019-11-03 DIAGNOSIS — Z96652 Presence of left artificial knee joint: Secondary | ICD-10-CM | POA: Diagnosis not present

## 2019-11-03 DIAGNOSIS — S79912A Unspecified injury of left hip, initial encounter: Secondary | ICD-10-CM | POA: Diagnosis not present

## 2019-11-03 DIAGNOSIS — I10 Essential (primary) hypertension: Secondary | ICD-10-CM | POA: Insufficient documentation

## 2019-11-03 DIAGNOSIS — Y939 Activity, unspecified: Secondary | ICD-10-CM | POA: Insufficient documentation

## 2019-11-03 DIAGNOSIS — M25552 Pain in left hip: Secondary | ICD-10-CM | POA: Diagnosis not present

## 2019-11-03 DIAGNOSIS — R52 Pain, unspecified: Secondary | ICD-10-CM

## 2019-11-03 DIAGNOSIS — Y999 Unspecified external cause status: Secondary | ICD-10-CM | POA: Insufficient documentation

## 2019-11-03 DIAGNOSIS — S3992XA Unspecified injury of lower back, initial encounter: Secondary | ICD-10-CM | POA: Diagnosis not present

## 2019-11-03 LAB — CBC WITH DIFFERENTIAL/PLATELET
Abs Immature Granulocytes: 0.03 10*3/uL (ref 0.00–0.07)
Basophils Absolute: 0 10*3/uL (ref 0.0–0.1)
Basophils Relative: 0 %
Eosinophils Absolute: 0.1 10*3/uL (ref 0.0–0.5)
Eosinophils Relative: 2 %
HCT: 33.6 % — ABNORMAL LOW (ref 36.0–46.0)
Hemoglobin: 10.5 g/dL — ABNORMAL LOW (ref 12.0–15.0)
Immature Granulocytes: 1 %
Lymphocytes Relative: 21 %
Lymphs Abs: 1.4 10*3/uL (ref 0.7–4.0)
MCH: 30.6 pg (ref 26.0–34.0)
MCHC: 31.3 g/dL (ref 30.0–36.0)
MCV: 98 fL (ref 80.0–100.0)
Monocytes Absolute: 0.8 10*3/uL (ref 0.1–1.0)
Monocytes Relative: 12 %
Neutro Abs: 4.2 10*3/uL (ref 1.7–7.7)
Neutrophils Relative %: 64 %
Platelets: 211 10*3/uL (ref 150–400)
RBC: 3.43 MIL/uL — ABNORMAL LOW (ref 3.87–5.11)
RDW: 12.5 % (ref 11.5–15.5)
WBC: 6.5 10*3/uL (ref 4.0–10.5)
nRBC: 0 % (ref 0.0–0.2)

## 2019-11-03 LAB — URINALYSIS, ROUTINE W REFLEX MICROSCOPIC
Bilirubin Urine: NEGATIVE
Glucose, UA: NEGATIVE mg/dL
Hgb urine dipstick: NEGATIVE
Ketones, ur: NEGATIVE mg/dL
Leukocytes,Ua: NEGATIVE
Nitrite: NEGATIVE
Protein, ur: NEGATIVE mg/dL
Specific Gravity, Urine: 1.011 (ref 1.005–1.030)
pH: 5 (ref 5.0–8.0)

## 2019-11-03 LAB — BASIC METABOLIC PANEL
Anion gap: 9 (ref 5–15)
BUN: 13 mg/dL (ref 8–23)
CO2: 28 mmol/L (ref 22–32)
Calcium: 8.7 mg/dL — ABNORMAL LOW (ref 8.9–10.3)
Chloride: 106 mmol/L (ref 98–111)
Creatinine, Ser: 0.87 mg/dL (ref 0.44–1.00)
GFR calc Af Amer: >60 mL/min (ref >60)
GFR calc non Af Amer: >60 mL/min (ref >60)
Glucose, Bld: 99 mg/dL (ref 70–99)
Potassium: 3.7 mmol/L (ref 3.5–5.1)
Sodium: 143 mmol/L (ref 135–145)

## 2019-11-03 MED ORDER — SODIUM CHLORIDE (PF) 0.9 % IJ SOLN
INTRAMUSCULAR | Status: AC
  Filled 2019-11-03: qty 50

## 2019-11-03 MED ORDER — MORPHINE SULFATE (PF) 4 MG/ML IV SOLN
4.0000 mg | Freq: Once | INTRAVENOUS | Status: AC
  Administered 2019-11-03: 4 mg via INTRAVENOUS
  Filled 2019-11-03: qty 1

## 2019-11-03 MED ORDER — SODIUM CHLORIDE 0.9 % IV BOLUS
1000.0000 mL | Freq: Once | INTRAVENOUS | Status: AC
  Administered 2019-11-03: 14:00:00 1000 mL via INTRAVENOUS

## 2019-11-03 MED ORDER — OXYCODONE HCL 5 MG PO TABS: 5.0000 mg | ORAL_TABLET | Freq: Three times a day (TID) | ORAL | 0 refills | Status: DC | PRN

## 2019-11-03 MED ORDER — OXYMETAZOLINE HCL 0.05 % NA SOLN
NASAL | Status: AC
  Filled 2019-11-03: qty 30

## 2019-11-03 MED ORDER — IOHEXOL 300 MG/ML  SOLN
100.0000 mL | Freq: Once | INTRAMUSCULAR | Status: AC | PRN
  Administered 2019-11-03: 100 mL via INTRAVENOUS

## 2019-11-03 NOTE — ED Provider Notes (Signed)
[facility] DEPT Provider Note   CSN: [id number] Arrival date & time: 11/03/19  1229     History Chief Complaint  Patient presents with  . Hip Pain  . Flank Pain    Dawn Kim is a 74 y.o. female.  Patient had a mechanical fall on Tuesday about 5 days ago landed on a coffee table. Refused to come to the hospital at that time with EMS. Has been able to ambulate with her walker and cane at home but having increasing pain. Has a large bruise to the left hip up to the left flank. Has been using Voltaren and Tylenol with minimal relief. Denies being on any blood thinners. No aspirin use.  The history is provided by the patient.  Hip Pain This is a new problem. Episode onset: 5 days. The problem occurs constantly. The problem has been gradually worsening. Associated symptoms include abdominal pain. Pertinent negatives include no chest pain, no headaches and no shortness of breath. The symptoms are aggravated by walking. The symptoms are relieved by medications. She has tried acetaminophen for the symptoms. The treatment provided mild relief.       Past Medical History:  Diagnosis Date  . Anxiety   . Cataract    Bil  . Depression   . Hypercholesteremia   . Hypertension   . Thyroid disease     Patient Active Problem List   Diagnosis Date Noted  . 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 Confusion 06/06/2018  . T12 compression fracture ([place]) 03/28/2018  . Midline low back pain without sciatica 03/21/2018  . Shortness of breath 01/06/2018  . Chronic lower back pain 09/19/2017  . 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 Osteopenia 06/21/2016  . Hypothyroidism 10/22/2015  . Depression 10/22/2015  . Anxiety 10/22/2015  . GERD (gastroesophageal reflux disease) 10/22/2015  . Insomnia 10/22/2015  . Bilateral foot pain 10/22/2015  . Bilateral knee pain 10/22/2015  . HTN (hypertension) 01/20/2012  . Hypercholesterolemia  01/20/2012    Past Surgical History:  Procedure Laterality Date  . CHOLECYSTECTOMY    . COLONOSCOPY    . FOOT SURGERY  2011   right foot  . JOINT REPLACEMENT  2010   left knee  . POLYPECTOMY    . REPLACEMENT TOTAL KNEE Left   . TONSILLECTOMY       OB History   No obstetric history on file.     Family History  Problem Relation Age of Onset  . Heart disease Mother   . Drug abuse Sister     Social History   Tobacco Use  . Smoking status: Former Smoker    Types: Cigarettes    Quit date: 11/04/2009    Years since quitting: 10.0  . Smokeless tobacco: Never Used  Substance Use Topics  . Alcohol use: No  . Drug use: No    Home Medications Prior to Admission medications   Medication Sig Start Date End Date Taking? Authorizing Provider  acetaminophen (TYLENOL) 325 MG tablet Take 650 mg by mouth every 6 (six) hours as needed for moderate pain.   Yes [provider]  ALPRAZolam (XANAX) 0.5 MG tablet TAKE 1 TABLET BY MOUTH THREE TIMES DAILY AS NEEDED FOR ANXIETY . DO NOT EXCEED 3 PER 24 HOURS 10/15/19  Yes Burns, Claudina Lick, MD  calcium carbonate (OS-CAL - DOSED IN MG OF ELEMENTAL CALCIUM) 1250 (500 Ca) MG tablet Take 1 tablet (500 mg of elemental calcium total) by  mouth daily. 05/31/16  Yes Nche, Charlene Brooke, NP  cholecalciferol (VITAMIN D) 1000 UNITS tablet Take 1,000 Units by mouth daily.   Yes [provider]  cilostazol (PLETAL) 50 MG tablet Take 0.5 tablets (25 mg total) by mouth 2 (two) times daily. 11/20/18  Yes Lyndal Pulley, DO  diclofenac sodium (VOLTAREN) 1 % GEL Apply 4 g topically 4 (four) times daily. 07/26/18  Yes Burns, Claudina Lick, MD  famotidine (PEPCID) 40 MG tablet TAKE 1 TABLET BY MOUTH AT BEDTIME 08/13/19  Yes Burns, Claudina Lick, MD  ferrous sulfate 325 (65 FE) MG EC tablet Take 325 mg by mouth daily with breakfast.   Yes [provider]  fish oil-omega-3 fatty acids 1000 MG capsule Take 1 g by mouth 2 (two) times daily.    Yes [provider]  levocetirizine (XYZAL) 5 MG tablet TAKE 1 TABLET BY MOUTH ONCE DAILY IN THE EVENING 08/13/19  Yes Burns, Claudina Lick, MD  levothyroxine (SYNTHROID) 112 MCG tablet Take 1 tablet by mouth once daily 10/22/19  Yes Burns, Claudina Lick, MD  Multiple Vitamin (MULTIVITAMIN) tablet Take 1 tablet by mouth daily.   Yes [provider]  omeprazole (PRILOSEC) 40 MG capsule TAKE 1 CAPSULE BY MOUTH TWICE DAILY BEFORE A MEAL 08/13/19  Yes Burns, Claudina Lick, MD  QUEtiapine (SEROQUEL) 50 MG tablet TAKE 1 TABLET BY MOUTH AT BEDTIME 08/13/19  Yes Burns, Claudina Lick, MD  ramipril (ALTACE) 10 MG capsule Take 1 capsule by mouth twice daily 08/13/19  Yes Burns, Claudina Lick, MD  sertraline (ZOLOFT) 100 MG tablet Take 2 tablets (200 mg total) by mouth daily. 09/13/19  Yes Burns, Claudina Lick, MD  simvastatin (ZOCOR) 40 MG tablet Take 1 tablet by mouth in the evening 03/19/19  Yes Burns, Claudina Lick, MD  oxyCODONE (ROXICODONE) 5 MG immediate release tablet Take 1 tablet (5 mg total) by mouth every 8 (eight) hours as needed for up to 15 doses for severe pain. 11/03/19   Marvelle Span, DO    Allergies    Gabapentin, Lunesta [eszopiclone], Penicillins, Sonata [zaleplon], Ambien [zolpidem], and Amoxicillin  Review of Systems   Review of Systems  Constitutional: Negative for chills and fever.  HENT: Negative for ear pain and sore throat.   Eyes: Negative for pain and visual disturbance.  Respiratory: Negative for cough and shortness of breath.   Cardiovascular: Negative for chest pain and palpitations.  Gastrointestinal: Positive for abdominal pain. Negative for vomiting.  Genitourinary: Negative for dysuria and hematuria.  Musculoskeletal: Positive for arthralgias, back pain and gait problem.  Skin: Positive for color change. Negative for rash.  Neurological: Negative for dizziness, tremors, seizures, syncope, facial asymmetry, speech difficulty, weakness, light-headedness, numbness and headaches.  All other systems reviewed and  are negative.   Physical Exam Updated Vital Signs  ED Triage Vitals  Enc Vitals Group     BP 11/03/19 1234 (!) 144/78     Pulse Rate 11/03/19 1234 68     Resp 11/03/19 1234 18     Temp 11/03/19 1234 98.1 F (36.7 C)     Temp Source 11/03/19 1234 Oral     SpO2 11/03/19 1234 97 %     Weight --      Height --      Head Circumference --      Peak Flow --      Pain Score 11/03/19 1240 10     Pain Loc --      Pain Edu? --  Excl. in [place]? --     Physical Exam Vitals and nursing note reviewed.  Constitutional:      General: She is not in acute distress.    Appearance: She is well-developed. She is not ill-appearing.  HENT:     Head: Normocephalic and atraumatic.     Nose: Nose normal.     Mouth/Throat:     Mouth: Mucous membranes are moist.     Pharynx: No oropharyngeal exudate.  Eyes:     Extraocular Movements: Extraocular movements intact.     Conjunctiva/sclera: Conjunctivae normal.     Pupils: Pupils are equal, round, and reactive to light.  Cardiovascular:     Rate and Rhythm: Normal rate and regular rhythm.     Pulses: Normal pulses.     Heart sounds: Normal heart sounds. No murmur.  Pulmonary:     Effort: Pulmonary effort is normal. No respiratory distress.     Breath sounds: Normal breath sounds.  Abdominal:     Palpations: Abdomen is soft.     Tenderness: There is no abdominal tenderness.  Musculoskeletal:        General: Tenderness present. Normal range of motion.     Cervical back: Normal range of motion and neck supple. No tenderness.     Comments: Tenderness to left hip, left lower back, no midline spinal tenderness  Skin:    General: Skin is warm and dry.     Capillary Refill: Capillary refill takes less than 2 seconds.     Findings: Bruising present.     Comments: Large left flank bruising from left hip up to left CVA area tenderness in this area  Neurological:     General: No focal deficit present.     Mental Status: She is alert and oriented to  person, place, and time.     Cranial Nerves: No cranial nerve deficit.     Sensory: No sensory deficit.     Motor: No weakness.  Psychiatric:        Mood and Affect: Mood normal.     ED Results / Procedures / Treatments   Labs (all labs ordered are listed, but only abnormal results are displayed) Labs Reviewed  CBC WITH DIFFERENTIAL/PLATELET - Abnormal; Notable for the following components:      Result Value   RBC 3.43 (*)    Hemoglobin 10.5 (*)    HCT 33.6 (*)    All other components within normal limits  BASIC METABOLIC PANEL - Abnormal; Notable for the following components:   Calcium 8.7 (*)    All other components within normal limits  URINALYSIS, ROUTINE W REFLEX MICROSCOPIC    EKG None  Radiology CT ABDOMEN PELVIS W CONTRAST  Result Date: 11/03/2019 CLINICAL DATA:  Fall last week.  Left flank and hip pain. EXAM: CT ABDOMEN AND PELVIS WITH CONTRAST TECHNIQUE: Multidetector CT imaging of the abdomen and pelvis was performed using the standard protocol following bolus administration of intravenous contrast. Reformats of the lumbar spine were also created. CONTRAST:  124mL OMNIPAQUE IOHEXOL 300 MG/ML  SOLN COMPARISON:  10/08/2019 radiographs of the lumbar spine. Renal ultrasound 06/07/2018. Most recent abdominopelvic CT of 05/09/2012. FINDINGS: Lower chest: Clear lung bases. Normal heart size without pericardial or pleural effusion. Hepatobiliary: Normal liver. Cholecystectomy, without biliary ductal dilatation. Pancreas: Normal, without mass or ductal dilatation. Note is made of a periampullary duodenal diverticulum. Spleen: Normal in size, without focal abnormality. Adrenals/Urinary Tract: Normal adrenal glands. Upper pole left renal too small to characterize lesion.  Normal right kidney. No hydronephrosis. Normal urinary bladder. Stomach/Bowel: Normal stomach, without wall thickening. Extensive colonic diverticulosis. Normal terminal ileum and appendix. Otherwise normal small  bowel. Vascular/Lymphatic: Aortic atherosclerosis. No abdominopelvic adenopathy. Reproductive: Normal uterus and adnexa. Other: No significant free fluid.  Mild pelvic floor laxity. Musculoskeletal: Subcutaneous edema about the left flank with a posterior lateral hematoma on the order of 7.5 x 3 3.8 cm on 30/7/4. More laterally, a simple fluid collection measures 8.4 x 4.0 cm. Mild T12 compression deformity, as on prior radiographs. Advanced lumbosacral spondylosis. Mild convex right lumbar spine curvature. Right-sided Tarlov cysts. Dedicated lumbar spine imaging demonstrates mild disc bulge at L1-2, resulting in central canal stenosis when combined with ligamentum flavum thickening and facet arthropathy. Similar findings at L2-3. Extension of disc bulge into the left neural foraminal region. Diffuse disc bulge at L4-5 with facet arthropathy resulting in central canal and bilateral neural foraminal narrowing. IMPRESSION: 1. Left flank hematoma. A more lateral simple fluid collection could represent a chronic hematoma but is nonspecific. 2. No intraabdominal/pelvic posttraumatic deformity identified. 3. Lumbosacral spondylosis, resulting in areas of central canal stenosis. 4. Chronic T12 mild compression deformity. Electronically Signed   By: Abigail Miyamoto M.D.   On: 11/03/2019 15:53   CT L-SPINE NO CHARGE  Result Date: 11/03/2019 CLINICAL DATA:  Fall last week.  Left flank and hip pain. EXAM: CT ABDOMEN AND PELVIS WITH CONTRAST TECHNIQUE: Multidetector CT imaging of the abdomen and pelvis was performed using the standard protocol following bolus administration of intravenous contrast. Reformats of the lumbar spine were also created. CONTRAST:  113mL OMNIPAQUE IOHEXOL 300 MG/ML  SOLN COMPARISON:  10/08/2019 radiographs of the lumbar spine. Renal ultrasound 06/07/2018. Most recent abdominopelvic CT of 05/09/2012. FINDINGS: Lower chest: Clear lung bases. Normal heart size without pericardial or pleural effusion.  Hepatobiliary: Normal liver. Cholecystectomy, without biliary ductal dilatation. Pancreas: Normal, without mass or ductal dilatation. Note is made of a periampullary duodenal diverticulum. Spleen: Normal in size, without focal abnormality. Adrenals/Urinary Tract: Normal adrenal glands. Upper pole left renal too small to characterize lesion. Normal right kidney. No hydronephrosis. Normal urinary bladder. Stomach/Bowel: Normal stomach, without wall thickening. Extensive colonic diverticulosis. Normal terminal ileum and appendix. Otherwise normal small bowel. Vascular/Lymphatic: Aortic atherosclerosis. No abdominopelvic adenopathy. Reproductive: Normal uterus and adnexa. Other: No significant free fluid.  Mild pelvic floor laxity. Musculoskeletal: Subcutaneous edema about the left flank with a posterior lateral hematoma on the order of 7.5 x 3 3.8 cm on 30/7/4. More laterally, a simple fluid collection measures 8.4 x 4.0 cm. Mild T12 compression deformity, as on prior radiographs. Advanced lumbosacral spondylosis. Mild convex right lumbar spine curvature. Right-sided Tarlov cysts. Dedicated lumbar spine imaging demonstrates mild disc bulge at L1-2, resulting in central canal stenosis when combined with ligamentum flavum thickening and facet arthropathy. Similar findings at L2-3. Extension of disc bulge into the left neural foraminal region. Diffuse disc bulge at L4-5 with facet arthropathy resulting in central canal and bilateral neural foraminal narrowing. IMPRESSION: 1. Left flank hematoma. A more lateral simple fluid collection could represent a chronic hematoma but is nonspecific. 2. No intraabdominal/pelvic posttraumatic deformity identified. 3. Lumbosacral spondylosis, resulting in areas of central canal stenosis. 4. Chronic T12 mild compression deformity. Electronically Signed   By: Abigail Miyamoto M.D.   On: 11/03/2019 15:53   DG Hip Unilat With Pelvis 2-3 Views Left  Result Date: 11/03/2019 CLINICAL DATA:   Fall last week with left hip pain. EXAM: DG HIP (WITH OR WITHOUT PELVIS) 2-3V LEFT  COMPARISON:  06/13/2019 FINDINGS: Mild symmetric degenerative change of the hips. No evidence of acute fracture or dislocation. Mild degenerate change of the spine. IMPRESSION: No acute findings. Electronically Signed   By: Marin Olp M.D.   On: 11/03/2019 13:58    Procedures Procedures (including critical care time)  Medications Ordered in ED Medications  sodium chloride (PF) 0.9 % injection (has no administration in time range)  morphine 4 MG/ML injection 4 mg (4 mg Intravenous Given 11/03/19 1359)  sodium chloride 0.9 % bolus 1,000 mL (0 mLs Intravenous Stopped 11/03/19 1455)  iohexol (OMNIPAQUE) 300 MG/ML solution 100 mL (100 mLs Intravenous Contrast Given 11/03/19 1513)    ED Course  I have reviewed the triage vital signs and the nursing notes.  Pertinent labs & imaging results that were available during my care of the patient were reviewed by me and considered in my medical decision making (see chart for details).    MDM Rules/Calculators/A&P  NATURI MCCORKLE is a 74 year old female with history of high cholesterol, hypertension, anxiety who presents to the ED with left hip pain, left flank pain after a fall 5 days ago.  Patient with mechanical fall but did not want to come to the emergency department when she first fell.  She has been ambulatory with her cane since.  Pain has been increasing despite over-the-counter pain medication use.  She has large left-sided flank hematoma.  X-ray and CT scan abdomen pelvis and lumbar spine showed no acute fractures.  No hip fracture.  There is a large left flank hematoma that is seen on the CT scan.  Overall there is no solid organ injury or bony injury.  Lab work showed no significant anemia, atrial abnormality, kidney injury.  Patient felt better after narcotic pain medicine.  No active narcotic scripts.  Will prescribe narcotic pain medicine for breakthrough pain.   Recommend continue over-the-counter pain medicine use with ice as well.  Discharged from the ED in good condition.  Given return precautions.  This chart was dictated using voice recognition software.  Despite best efforts to proofread,  errors can occur which can change the documentation meaning.    Final Clinical Impression(s) / ED Diagnoses Final diagnoses:  Pain  Hematoma    Rx / DC Orders ED Discharge Orders         Ordered    oxyCODONE (ROXICODONE) 5 MG immediate release tablet  Every 8 hours PRN     11/03/19 Strawn, [place], DO 11/03/19 1610

## 2019-11-03 NOTE — ED Triage Notes (Signed)
Pt reports falling last Tuesday on her left side. Pt has not been seen for this until now. Pt reports pain in left flank and hip area. Pt has significant bruising to this area. Pt asked triage RN to stop asking so many questionon because she was in so much pain.

## 2019-11-04 ENCOUNTER — Ambulatory Visit: Payer: Medicare Other | Attending: Internal Medicine

## 2019-11-04 DIAGNOSIS — Z23 Encounter for immunization: Secondary | ICD-10-CM

## 2019-11-04 NOTE — Progress Notes (Signed)
   Covid-19 Vaccination Clinic  Name:  Dawn Kim    MRN: [id number] DOB: 03/06/1946  11/04/2019  [name] was observed post Covid-19 immunization for 15 minutes without incidence. She was provided with Vaccine Information Sheet and instruction to access the V-Safe system.   [name] was instructed to call 911 with any severe reactions post vaccine: [name] Difficulty breathing  . Swelling of your face and throat  . A fast heartbeat  . A bad rash all over your body  . Dizziness and weakness    Immunizations Administered    Name Date Dose VIS Date Route   Pfizer COVID-19 Vaccine 11/04/2019 12:48 PM 0.3 mL 09/14/2019 Intramuscular   Manufacturer: [place]   Lot: [id number]   [place]: [id number]

## 2019-11-14 ENCOUNTER — Telehealth: Payer: Self-pay

## 2019-11-14 NOTE — Telephone Encounter (Signed)
New message    Pt c/o medication issue:  1. Name of Medication: QUEtiapine (SEROQUEL) 50 MG tablet  2. How are you currently taking this medication (dosage and times per day)? At night   3. Are you having a reaction (difficulty breathing--STAT)? No   4. What is your medication issue? Can medication be changed only getting a couple of hours of sleep.

## 2019-11-14 NOTE — Telephone Encounter (Signed)
We can retry trazodone if she wants, but this was not effective for her in the past.  If she wants to stop Seroquel and try trazodone we will need to decrease her sertraline dose because that can react with the trazodone.  The sertraline is what she takes for anxiety/depression.

## 2019-11-14 NOTE — Telephone Encounter (Signed)
Patient called back requesting trazodone for sleep.

## 2019-11-14 NOTE — Telephone Encounter (Signed)
F/u   The patient is asking the nurse to call her back regarding this morning message.

## 2019-11-15 ENCOUNTER — Other Ambulatory Visit: Payer: Self-pay | Admitting: Internal Medicine

## 2019-11-15 MED ORDER — ALPRAZOLAM 0.5 MG PO TABS: ORAL_TABLET | ORAL | 0 refills | Status: DC

## 2019-11-15 NOTE — Telephone Encounter (Signed)
   Patient declined to schedule appointment at this time, states she will call back

## 2019-11-15 NOTE — Telephone Encounter (Signed)
F/u   The patient calling back to speak with nurse

## 2019-11-15 NOTE — Telephone Encounter (Signed)
Last RF 10/19/19 Last OV 09/13/19 Next OV 03/13/20

## 2019-11-15 NOTE — Telephone Encounter (Signed)
Spoke with patient yesterday after Dr. Quay Burow left. She asked if she could try taking the effexor again to help with sleep.

## 2019-11-15 NOTE — Telephone Encounter (Signed)
° ° ° °  Medication Requested:   Is medication on med list: ALPRAZolam (XANAX) 0.5 MG tablet (if no, inform pt they may need an appointment)  Is medication a controled: yes (yes = last OV with PCP)  -Controlled Substances: Adderall, Ritalin, oxycodone, hydrocodone, methadone, alprazolam, etc  Last visit with PCP:   Is the OV > than 4 months: last seen 09/13/19 (yes = schedule an appt if one is not already made)  Pharmacy (Name, [facility]):[facility] [address]

## 2019-11-15 NOTE — Telephone Encounter (Signed)
effexor is for depression and anxiety, but does not help with sleep and still interacts with trazodone.     She should make an appt so we can discus

## 2019-11-19 NOTE — Progress Notes (Signed)
Subjective:    Patient ID: Dawn Kim, female    DOB: 10-31-1945, 74 y.o.   MRN: [id number]  HPI The patient is here for an acute visit.   She is taking all of her medications as prescribed.    Her BP is elevated at home.  She does check her blood pressure on a regular basis.  She feels her anxiety and increased pain has been causing some of the elevation.  She does feel dizzy at times.  She denies chest pain, palpitations and headaches.  She did fall the other day and was seen in the emergency room.  She suffered a large hematoma and is having increased pain from that.  She is taking Tylenol.   Difficulty sleeping: She was here the end of December and stated that her sleep medication was working well.  She was getting about 6 hours of sleep, which is good for her.  She comes in today stating that is no longer working.  She states she takes it around 930 and when she feels sleepy she goes into the bedroom and goes to sleep.  She may wake up 4 hours later, go to the bathroom and states she knows she is not to get back to bed so she will go and make coffee.  She states she is getting about 4 hours of sleep at night and feels exhausted.  She would like to switch her sleep medications.  Anxiety, depression: She still feels very anxious.  She is taking her alprazolam as prescribed.  She is taking the sertraline and does not feel that it is working.  She wanted to go back to the Effexor.   Medications and allergies reviewed with patient and updated if appropriate.  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     ALPRAZolam (XANAX) 0.5 MG tablet TAKE 1 TABLET BY MOUTH THREE TIMES DAILY AS NEEDED FOR ANXIETY . DO NOT EXCEED 3 PER 24 HOURS 90 tablet 0   calcium carbonate (OS-CAL - DOSED IN MG OF ELEMENTAL CALCIUM) 1250 (500 Ca) MG tablet Take 1 tablet (500 mg of elemental calcium total) by mouth daily.     cholecalciferol (VITAMIN D) 1000 UNITS tablet Take 1,000 Units by mouth daily.     cilostazol (PLETAL) 50 MG tablet Take 0.5 tablets (25 mg total) by mouth 2 (two) times daily. 60 tablet 1   famotidine (PEPCID) 40 MG tablet TAKE 1 TABLET BY MOUTH AT BEDTIME 90 tablet 0   ferrous sulfate 325 (65 FE) MG EC tablet Take 325 mg by mouth daily with breakfast.     fish oil-omega-3 fatty acids 1000 MG capsule Take 1 g by mouth  2 (two) times daily.      levocetirizine (XYZAL) 5 MG tablet TAKE 1 TABLET BY MOUTH ONCE DAILY IN THE EVENING 90 tablet 0   levothyroxine (SYNTHROID) 112 MCG tablet Take 1 tablet by mouth once daily 90 tablet 1   Multiple Vitamin (MULTIVITAMIN) tablet Take 1 tablet by mouth daily.     omeprazole (PRILOSEC) 40 MG capsule TAKE 1 CAPSULE BY MOUTH TWICE DAILY BEFORE A MEAL 180 capsule 0   QUEtiapine (SEROQUEL) 50 MG tablet TAKE 1 TABLET BY MOUTH AT BEDTIME 90 tablet 0   ramipril (ALTACE) 10 MG capsule Take 1 capsule by mouth twice daily 180 capsule 0   sertraline (ZOLOFT) 100 MG tablet Take 2 tablets (200 mg total) by mouth daily. 60 tablet 5   simvastatin (ZOCOR) 40 MG tablet Take 1 tablet by mouth in the evening 90 tablet 1   No  current facility-administered medications on file prior to visit.    Past Medical History:  Diagnosis Date   Anxiety    Cataract    Bil   Depression    Hypercholesteremia    Hypertension    Thyroid disease     Past Surgical History:  Procedure Laterality Date   CHOLECYSTECTOMY     COLONOSCOPY     FOOT SURGERY  2011   right foot   JOINT REPLACEMENT  2010   left knee   POLYPECTOMY     REPLACEMENT TOTAL KNEE Left    TONSILLECTOMY      Social History   Socioeconomic History   Marital status: Married    Spouse name: Not on file   Number of children: 3   Years of education: 12   Highest education level: Not on file  Occupational History   Occupation: Retired   Tobacco Use   Smoking status: Former Smoker    Types: Cigarettes    Quit date: 11/04/2009    Years since quitting: 10.0   Smokeless tobacco: Never Used  Substance and Sexual Activity   Alcohol use: No   Drug use: No   Sexual activity: Never  Other Topics Concern   Not on file  Social History Narrative   Cares for 4yo husband; has daughter locally who can help if needed   Social Determinants of Health   Financial Resource Strain:    Difficulty of Paying Living Expenses: Not on file  Food Insecurity:    Worried About Charity fundraiser in the Last Year: Not on file   [place] in the Last Year: Not on file  Transportation Needs:    Lack of Transportation (Medical): Not on file   Lack of Transportation (Non-Medical): Not on file  Physical Activity:    Days of Exercise per Week: Not on file   Minutes of Exercise per Session: Not on file  Stress:    Feeling of Stress : Not on file  Social Connections:    Frequency of Communication with Friends and Family: Not on file   Frequency of Social Gatherings with Friends and Family: Not on file   Attends Religious Services: Not on file   Active Member of Clubs or Organizations: Not on file   Attends Theatre manager Meetings: Not on file   Marital Status: Not on file    Family History  Problem Relation Age of Onset   Heart disease Mother    Drug abuse Sister     Review of Systems  Constitutional: Negative for fever.  Cardiovascular: Negative  for chest pain, palpitations and leg swelling.  Neurological: Positive for dizziness (occ). Negative for headaches.  Psychiatric/Behavioral: Positive for dysphoric mood and sleep disturbance. The patient is nervous/anxious.        Objective:   Vitals:   11/20/19 1429  BP: (!) 170/90  Pulse: 61  Resp: 16  Temp: 98.3 F (36.8 C)  SpO2: 98%   BP Readings from Last 3 Encounters:  11/20/19 (!) 170/90  11/03/19 110/83  10/08/19 (!) 170/110   Wt Readings from Last 3 Encounters:  11/20/19 206 lb (93.4 kg)  10/08/19 210 lb 6.4 oz (95.4 kg)  09/13/19 202 lb 12.8 oz (92 kg)   Body mass index is 34.28 kg/m.   Physical Exam    Constitutional: Appears well-developed and well-nourished. No distress.  Head: Normocephalic and atraumatic.  Neck: Neck supple. No tracheal deviation present. No thyromegaly present.  No cervical lymphadenopathy Cardiovascular: Normal rate, regular rhythm and normal heart sounds.  No murmur heard.  No edema Pulmonary/Chest: Effort normal and breath sounds normal. No respiratory distress. No has no wheezes. No rales.  Skin: Skin is warm and dry. Not diaphoretic.  Psychiatric: anxious mood and affect. Behavior is normal.       Assessment & Plan:    See Problem List for Assessment and Plan of chronic medical problems.    This visit occurred during the SARS-CoV-2 public health emergency.  Safety protocols were in place, including screening questions prior to the visit, additional usage of staff PPE, and extensive cleaning of exam room while observing appropriate contact time as indicated for disinfecting solutions.

## 2019-11-20 ENCOUNTER — Encounter: Payer: Self-pay | Admitting: Internal Medicine

## 2019-11-20 ENCOUNTER — Telehealth: Payer: Self-pay

## 2019-11-20 ENCOUNTER — Other Ambulatory Visit: Payer: Self-pay

## 2019-11-20 ENCOUNTER — Ambulatory Visit (INDEPENDENT_AMBULATORY_CARE_PROVIDER_SITE_OTHER): Payer: Medicare Other | Admitting: Internal Medicine

## 2019-11-20 DIAGNOSIS — G4700 Insomnia, unspecified: Secondary | ICD-10-CM

## 2019-11-20 DIAGNOSIS — F329 Major depressive disorder, single episode, unspecified: Secondary | ICD-10-CM

## 2019-11-20 DIAGNOSIS — F419 Anxiety disorder, unspecified: Secondary | ICD-10-CM | POA: Diagnosis not present

## 2019-11-20 DIAGNOSIS — I10 Essential (primary) hypertension: Secondary | ICD-10-CM | POA: Diagnosis not present

## 2019-11-20 DIAGNOSIS — F32A Depression, unspecified: Secondary | ICD-10-CM

## 2019-11-20 MED ORDER — OMEPRAZOLE 40 MG PO CPDR: DELAYED_RELEASE_CAPSULE | ORAL | 0 refills | Status: DC

## 2019-11-20 MED ORDER — AMLODIPINE BESYLATE 5 MG PO TABS: 5.0000 mg | ORAL_TABLET | Freq: Every day | ORAL | 1 refills | Status: DC

## 2019-11-20 NOTE — Patient Instructions (Addendum)
   Medications reviewed and updated.  Changes include :   Start amlodipine 5 mg daily for your blood pressure.  Decrease sertraline to 100 mg nightly for two weeks.  Decrease sertraline to 50 mg nightly for one week and then stop the medication.     Your prescription(s) have been submitted to your pharmacy. Please take as directed and contact our office if you believe you are having problem(s) with the medication(s).     Please followup in 3 weeks we will adjust your medications further at that time.

## 2019-11-20 NOTE — Telephone Encounter (Signed)
At check out patient states that she is needing this refilled. Did not mention to Dr Quay Burow.   Medication Requested:   omeprazole (PRILOSEC) 40 MG capsule   Is medication on med list:  yes (if no, inform pt they may need an appointment)  Is medication a controled: no (yes = last OV with PCP)  -Controlled Substances: Adderall, Ritalin, oxycodone, hydrocodone, methadone, alprazolam, etc  Last visit with PCP: 11/20/2019  Is the OV > than 4 months:  yes (yes = schedule an appt if one is not already made)  Pharmacy (Name, [facility]):  [place], [place]

## 2019-11-20 NOTE — Assessment & Plan Note (Signed)
Chronic Not ideally controlled She would like to stop the sertraline and restart Effexor We will taper her off of sertraline and have her follow-up in 3 weeks and then at that time start the Effexor

## 2019-11-20 NOTE — Telephone Encounter (Signed)
Rx sent 

## 2019-11-20 NOTE — Assessment & Plan Note (Signed)
Chronic Not controlled, but never has been We will continue alprazolam.  I did discuss with her that most likely this is not helping at all, but she has been on it for years and I think would be extremely difficult to get her off of it She would like to go back on Effexor Will taper her off sertraline over the next 3 weeks and have her return.  At that time I will restart the Effexor.  I do not want her to be confused about the medication so we will not restart the Effexor until she comes back Unfortunately psychiatry and therapy are now covered by her insurance.  Discussed with her that she needs to work on dealing with some of her stresses at home differently

## 2019-11-20 NOTE — Assessment & Plan Note (Signed)
Chronic She states that Seroquel is no longer working, but 2 months ago it was working well She has tried numerous medications and most of them have not worked or give her side effects.  She did want to try to go back to the trazodone, but after well that was no longer effective We reviewed her sleep patterns and I discussed with her that the most important thing to improve her sleep is improving her sleep hygiene.  Discussed that if she is not able to get to sleep after waking up in the middle the night she should get up and read for 30 minutes.  When she feels sleepy she should go back to bed.  She should not wake up and drink coffee in the middle the night or do anything that may stimulate her She should go to bed at the same time, wake up at the same time We will continue Seroquel at current dose for now She will follow-up in 3 weeks Again discussed the importance of working on her stress and sleep with changes in her behavior.  Discussed that no matter what medication she takes it is not going to fix most of her sleep and anxiety

## 2019-11-20 NOTE — Assessment & Plan Note (Signed)
Chronic Currently not controlled Elevated here and has been elevated at home Add amlodipine 5 mg daily Continue ramipril 10 mg twice daily Follow-up in 3 weeks

## 2019-11-27 ENCOUNTER — Other Ambulatory Visit: Payer: Self-pay | Admitting: Internal Medicine

## 2019-11-27 NOTE — Telephone Encounter (Signed)
Patient was seen on 2/16 and now states her legs and ankles are swelling. She states Dr.Burns told her to call her back if this started to happen.  She is requesting a medication for this to be called in. Send to pharmacy on file.

## 2019-11-27 NOTE — Telephone Encounter (Signed)
Stop the amlodipine and start hctz 25 mg daily - pending.

## 2019-11-28 ENCOUNTER — Telehealth: Payer: Self-pay

## 2019-11-28 MED ORDER — HYDROCHLOROTHIAZIDE 25 MG PO TABS: 25.0000 mg | ORAL_TABLET | Freq: Every day | ORAL | 3 refills | Status: DC

## 2019-11-28 NOTE — Telephone Encounter (Signed)
New message   Last Seen 2/16/201   Offer Appt decline.   Can MD call something into the neighborhood [facility]?   1) How much weight have you gained and in what time span?  Per patient Weight same   2) If swelling, where is the swelling located? Both ankle   3) Are you currently taking a fluid pill? No

## 2019-11-28 NOTE — Telephone Encounter (Signed)
LM notifying pt

## 2019-11-28 NOTE — Telephone Encounter (Signed)
Did we call her about switching her medication yet - other phone note?

## 2019-11-28 NOTE — Telephone Encounter (Signed)
Pt aware.

## 2019-12-02 ENCOUNTER — Other Ambulatory Visit: Payer: Self-pay | Admitting: Internal Medicine

## 2019-12-04 ENCOUNTER — Telehealth: Payer: Self-pay | Admitting: Internal Medicine

## 2019-12-04 DIAGNOSIS — H52203 Unspecified astigmatism, bilateral: Secondary | ICD-10-CM | POA: Diagnosis not present

## 2019-12-04 NOTE — Telephone Encounter (Signed)
New Message:   Pt states she needs a call back in regards to cologuard. Please advise.

## 2019-12-05 NOTE — Telephone Encounter (Signed)
Spoke with pt in regards and advised that she is not quite due to a cologuard again. Pt expressed understanding.

## 2019-12-10 ENCOUNTER — Other Ambulatory Visit: Payer: Self-pay | Admitting: Internal Medicine

## 2019-12-10 NOTE — Telephone Encounter (Signed)
Last OV 11/20/19 Next OV 12/12/19 Last RF 11/16/19

## 2019-12-11 ENCOUNTER — Ambulatory Visit: Payer: Medicare Other | Admitting: Internal Medicine

## 2019-12-11 NOTE — Progress Notes (Signed)
Subjective:    Patient ID: Dawn Kim, female    DOB: July 03, 1946, 74 y.o.   MRN: [id number]  HPI The patient is here for follow up of their chronic medical problems, including insomnia, anxiety, depression, hypertension  Depression, anxiety:   At her last visit we tapered her down on the sertraline with the idea of restarting the Effexor, which she wanted to go back on.  She states her anxiety is bad.  She is unable to tell me if she feels like it got worse since coming off of the sertraline.  Hypertension: we started amlodipine.  She is taking her other medications.  She states her blood pressure has been high at home.  She does have occasional headaches, but they are not increased from usual.  She denies any chest pain, palpitations or lower extremity edema.  She has shortness of breath with exertion, but this is not new and is unchanged.  Insomnia: She still states her sleep is terrible.  She is not able to sleep.  The Seroquel is not helping.  She wonders about going back on trazodone, which did not work for her in the past.   Medications and allergies reviewed with patient and updated if appropriate.  Patient Active Problem List   Diagnosis Date Noted  . 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 Confusion 06/06/2018  . T12 compression fracture ([place]) 03/28/2018  . Midline low back pain without sciatica 03/21/2018  . Shortness of breath 01/06/2018  . Chronic lower back pain 09/19/2017  . 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 Osteopenia 06/21/2016  . Hypothyroidism 10/22/2015  . Depression 10/22/2015  . Anxiety 10/22/2015  . GERD (gastroesophageal reflux disease) 10/22/2015  . Insomnia 10/22/2015  . Bilateral foot pain 10/22/2015  . Bilateral knee pain 10/22/2015  . HTN (hypertension) 01/20/2012  . Hypercholesterolemia 01/20/2012    Current Outpatient Medications on File Prior to Visit  Medication Sig Dispense Refill  . acetaminophen  (TYLENOL) 325 MG tablet Take 650 mg by mouth every 6 (six) hours as needed for moderate pain.    [name] ALPRAZolam (XANAX) 0.5 MG tablet TAKE 1 TABLET BY MOUTH THREE TIMES DAILY AS NEEDED FOR ANXIETY . DO NOT EXCEED 3 PER 24 HOURS 90 tablet 0  . calcium carbonate (OS-CAL - DOSED IN MG OF ELEMENTAL CALCIUM) 1250 (500 Ca) MG tablet Take 1 tablet (500 mg of elemental calcium total) by mouth daily.    . cholecalciferol (VITAMIN D) 1000 UNITS tablet Take 1,000 Units by mouth daily.    . cilostazol (PLETAL) 50 MG tablet Take 0.5 tablets (25 mg total) by mouth 2 (two) times daily. 60 tablet 1  . famotidine (PEPCID) 40 MG tablet TAKE 1 TABLET BY MOUTH AT BEDTIME 90 tablet 0  . ferrous sulfate 325 (65 FE) MG EC tablet Take 325 mg by mouth daily with breakfast.    . fish oil-omega-3 fatty acids 1000 MG capsule Take 1 g by mouth 2 (two) times daily.     . hydrochlorothiazide (HYDRODIURIL) 25 MG tablet Take 1 tablet (25 mg total) by mouth daily. 30 tablet 3  . levocetirizine (XYZAL) 5 MG tablet TAKE 1 TABLET BY MOUTH ONCE DAILY IN THE EVENING 90 tablet 0  . levothyroxine (SYNTHROID) 112 MCG tablet Take 1 tablet by mouth once daily 90 tablet 1  . Multiple Vitamin (MULTIVITAMIN) tablet Take 1 tablet by mouth daily.    [name] omeprazole (PRILOSEC) 40 MG capsule TAKE 1 CAPSULE  BY MOUTH TWICE DAILY BEFORE A MEAL 180 capsule 0  . ramipril (ALTACE) 10 MG capsule Take 1 capsule by mouth twice daily 180 capsule 0  . simvastatin (ZOCOR) 40 MG tablet Take 1 tablet by mouth in the evening 90 tablet 0   No current facility-administered medications on file prior to visit.    Past Medical History:  Diagnosis Date  . Anxiety   . Cataract    Bil  . Depression   . Hypercholesteremia   . Hypertension   . Thyroid disease     Past Surgical History:  Procedure Laterality Date  . CHOLECYSTECTOMY    . COLONOSCOPY    . FOOT SURGERY  2011   right foot  . JOINT REPLACEMENT  2010   left knee  . POLYPECTOMY    . REPLACEMENT  TOTAL KNEE Left   . TONSILLECTOMY      Social History   Socioeconomic History  . Marital status: Married    Spouse name: Not on file  . Number of children: 3  . Years of education: 27  . Highest education level: Not on file  Occupational History  . Occupation: Retired   Tobacco Use  . Smoking status: Former Smoker    Types: Cigarettes    Quit date: 11/04/2009    Years since quitting: 10.1  . Smokeless tobacco: Never Used  Substance and Sexual Activity  . Alcohol use: No  . Drug use: No  . Sexual activity: Never  Other Topics Concern  . Not on file  Social History Narrative   Cares for 96yo husband; has daughter locally who can help if needed   Social Determinants of Health   Financial Resource Strain:   . Difficulty of Paying Living Expenses: Not on file  Food Insecurity:   . Worried About Charity fundraiser in the Last Year: Not on file  . Ran Out of Food in the Last Year: Not on file  Transportation Needs:   . Lack of Transportation (Medical): Not on file  . Lack of Transportation (Non-Medical): Not on file  Physical Activity:   . Days of Exercise per Week: Not on file  . Minutes of Exercise per Session: Not on file  Stress:   . Feeling of Stress : Not on file  Social Connections:   . Frequency of Communication with Friends and Family: Not on file  . Frequency of Social Gatherings with Friends and Family: Not on file  . Attends Religious Services: Not on file  . Active Member of Clubs or Organizations: Not on file  . Attends Archivist Meetings: Not on file  . Marital Status: Not on file    Family History  Problem Relation Age of Onset  . Heart disease Mother   . Drug abuse Sister     Review of Systems  Constitutional: Negative for fever.  Respiratory: Positive for shortness of breath (with exertion, not new). Negative for cough and wheezing.   Cardiovascular: Negative for chest pain, palpitations and leg swelling.  Neurological: Positive for  headaches (occ).  Psychiatric/Behavioral: Positive for sleep disturbance. The patient is nervous/anxious.        Objective:   Vitals:   12/12/19 1037  BP: (!) 164/92  Pulse: 75  Temp: 98.5 F (36.9 C)  SpO2: 96%   BP Readings from Last 3 Encounters:  12/12/19 (!) 164/92  11/20/19 (!) 170/90  11/03/19 110/83   Wt Readings from Last 3 Encounters:  12/12/19 201 lb (91.2 kg)  11/20/19 206 lb (93.4 kg)  10/08/19 210 lb 6.4 oz (95.4 kg)   Body mass index is 33.45 kg/m.   Physical Exam    Constitutional: Appears well-developed and well-nourished. No distress.  HENT:  Head: Normocephalic and atraumatic.  Neck: Neck supple. No tracheal deviation present. No thyromegaly present.  No cervical lymphadenopathy Cardiovascular: Normal rate, regular rhythm and normal heart sounds.   No murmur heard. No carotid bruit .  No edema Pulmonary/Chest: Effort normal and breath sounds normal. No respiratory distress. No has no wheezes. No rales.  Skin: Skin is warm and dry. Not diaphoretic.  Psychiatric: Anxious mood and affect. Behavior is normal.      Assessment & Plan:    See Problem List for Assessment and Plan of chronic medical problems.    This visit occurred during the SARS-CoV-2 public health emergency.  Safety protocols were in place, including screening questions prior to the visit, additional usage of staff PPE, and extensive cleaning of exam room while observing appropriate contact time as indicated for disinfecting solutions.

## 2019-12-12 ENCOUNTER — Encounter: Payer: Self-pay | Admitting: Internal Medicine

## 2019-12-12 ENCOUNTER — Other Ambulatory Visit: Payer: Self-pay

## 2019-12-12 ENCOUNTER — Ambulatory Visit (INDEPENDENT_AMBULATORY_CARE_PROVIDER_SITE_OTHER): Payer: Medicare Other | Admitting: Internal Medicine

## 2019-12-12 VITALS — BP 164/92 | HR 75 | Temp 98.5°F | Ht 65.0 in | Wt 201.0 lb

## 2019-12-12 DIAGNOSIS — F419 Anxiety disorder, unspecified: Secondary | ICD-10-CM | POA: Diagnosis not present

## 2019-12-12 DIAGNOSIS — F329 Major depressive disorder, single episode, unspecified: Secondary | ICD-10-CM

## 2019-12-12 DIAGNOSIS — I10 Essential (primary) hypertension: Secondary | ICD-10-CM | POA: Diagnosis not present

## 2019-12-12 DIAGNOSIS — G4700 Insomnia, unspecified: Secondary | ICD-10-CM | POA: Diagnosis not present

## 2019-12-12 DIAGNOSIS — F32A Depression, unspecified: Secondary | ICD-10-CM

## 2019-12-12 MED ORDER — AMLODIPINE BESYLATE 5 MG PO TABS: 10.0000 mg | ORAL_TABLET | Freq: Every day | ORAL | 1 refills | Status: DC

## 2019-12-12 MED ORDER — VENLAFAXINE HCL ER 75 MG PO CP24: ORAL_CAPSULE | ORAL | 0 refills | Status: DC

## 2019-12-12 MED ORDER — QUETIAPINE FUMARATE 100 MG PO TABS: 100.0000 mg | ORAL_TABLET | Freq: Every day | ORAL | 5 refills | Status: DC

## 2019-12-12 NOTE — Assessment & Plan Note (Signed)
Chronic Depression is not the biggest issue Stop sertraline and restart Effexor

## 2019-12-12 NOTE — Assessment & Plan Note (Addendum)
Chronic Not controlled She has tried several medications in the past and has either had side effects or they have worked briefly and then been not effective Discussed that her anxiety is a big part of that and we will try to get that better controlled with medication, but she needs to do more with her lifestyle to help both her anxiety and sleep Will increase Seroquel to 100 mg at bedtime-hopefully this will help with her sleep and her anxiety I have discussed with her in the past seeing a psychiatrist and therapist and they are not covered by her insurance so she does not want to do that, but I do think she would benefit greatly from seeing both

## 2019-12-12 NOTE — Assessment & Plan Note (Signed)
Chronic Not controlled, but has never been controlled Discussed with her that most likely no matter what medication we put her on she will likely still have some anxiety Discussed that she should see a psychiatrist and a therapist to help, but this is not covered by insurance and she does not want to do that Stop sertraline Restart Effexor-start at 75 mg daily for 1 week then increase to 150 mg daily Continue alprazolam 3 times a day.  This is not the best medication for her, but lorazepam was not effective in clonazepam was not covered by her insurance Seroquel increased for sleep and hopefully that will help some with her anxiety Stressed that she needs to work on her anxiety and other ways than just medication Follow-up in 1 month

## 2019-12-12 NOTE — Assessment & Plan Note (Signed)
Chronic Not controlled Increase amlodipine to 10 mg daily Continue hydrochlorothiazide 25 mg daily Continue ramipril 10 mg daily Monitor BP at home Follow-up in 1 month

## 2019-12-12 NOTE — Patient Instructions (Addendum)
   Medications reviewed and updated.  Changes include :      Stop sertraline.  Start effexor and take as prescribed.    Increase seroquel to 100 mg daily.    Increase amlodipine to 10 mg daily.     Your prescription(s) have been submitted to your pharmacy. Please take as directed and contact our office if you believe you are having problem(s) with the medication(s).     Please followup in 1 month

## 2020-01-03 ENCOUNTER — Other Ambulatory Visit: Payer: Self-pay | Admitting: Internal Medicine

## 2020-01-09 ENCOUNTER — Telehealth: Payer: Self-pay | Admitting: Internal Medicine

## 2020-01-09 ENCOUNTER — Telehealth: Payer: Self-pay

## 2020-01-09 ENCOUNTER — Other Ambulatory Visit: Payer: Self-pay | Admitting: Internal Medicine

## 2020-01-09 MED ORDER — ALPRAZOLAM 0.5 MG PO TABS: ORAL_TABLET | ORAL | 0 refills | Status: DC

## 2020-01-09 NOTE — Telephone Encounter (Signed)
New message    The patient is asking the nurse to call her back regarding medication ALPRAZolam (XANAX) 0.5 MG tablet

## 2020-01-09 NOTE — Telephone Encounter (Signed)
Check [place] registry last filled 12/15/2019.Marland KitchenJohny Chess

## 2020-01-09 NOTE — Telephone Encounter (Signed)
    1.Medication Requested: ALPRAZolam (XANAX) 0.5 MG tablet  2. Pharmacy (Name, [address], [facility]): [place], [address]  3. On Med List: yes  4. Last Visit with PCP: 12/12/19  5. Next visit date with PCP: 01/14/20   Agent: Please be advised that RX refills may take up to 3 business days. We ask that you follow-up with your pharmacy.

## 2020-01-13 NOTE — Progress Notes (Deleted)
Subjective:    Patient ID: Dawn Kim, female    DOB: 31-Oct-1945, 74 y.o.   MRN: [id number]  HPI The patient is here for follow up of their chronic medical problems.   Anxiety, depression:  We stopped her sertraline at her last visit and started effexor again. We also increased her seroquel.  Insomnia  We increased the seroquel at her last visit.    Hypertension:  We increased her amlodipine.      Medications and allergies reviewed with patient and updated if appropriate.  Patient Active Problem List   Diagnosis Date Noted  . 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 Confusion 06/06/2018  . T12 compression fracture ([place]) 03/28/2018  . Midline low back pain without sciatica 03/21/2018  . Shortness of breath 01/06/2018  . Chronic lower back pain 09/19/2017  . 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 Osteopenia 06/21/2016  . Hypothyroidism 10/22/2015  . Depression 10/22/2015  . Anxiety 10/22/2015  . GERD (gastroesophageal reflux disease) 10/22/2015  . Insomnia 10/22/2015  . Bilateral foot pain 10/22/2015  . Bilateral knee pain 10/22/2015  . HTN (hypertension) 01/20/2012  . Hypercholesterolemia 01/20/2012    Current Outpatient Medications on File Prior to Visit  Medication Sig Dispense Refill  . acetaminophen (TYLENOL) 325 MG tablet Take 650 mg by mouth every 6 (six) hours as needed for moderate pain.    [name] ALPRAZolam (XANAX) 0.5 MG tablet TAKE 1 TABLET BY MOUTH THREE TIMES DAILY AS NEEDED FOR ANXIETY . DO NOT EXCEED 3 PER 24 HOURS 90 tablet 0  . amLODipine (NORVASC) 5 MG tablet Take 2 tablets (10 mg total) by mouth daily. 180 tablet 1  . calcium carbonate (OS-CAL - DOSED IN MG OF ELEMENTAL CALCIUM) 1250 (500 Ca) MG tablet Take 1 tablet (500 mg of elemental calcium total) by mouth daily.    . cholecalciferol (VITAMIN D) 1000 UNITS tablet Take 1,000 Units by mouth daily.    . cilostazol (PLETAL) 50 MG tablet Take 0.5 tablets (25 mg total) by  mouth 2 (two) times daily. 60 tablet 1  . famotidine (PEPCID) 40 MG tablet TAKE 1 TABLET BY MOUTH AT BEDTIME 90 tablet 0  . ferrous sulfate 325 (65 FE) MG EC tablet Take 325 mg by mouth daily with breakfast.    . fish oil-omega-3 fatty acids 1000 MG capsule Take 1 g by mouth 2 (two) times daily.     . hydrochlorothiazide (HYDRODIURIL) 25 MG tablet Take 1 tablet (25 mg total) by mouth daily. 30 tablet 3  . levocetirizine (XYZAL) 5 MG tablet TAKE 1 TABLET BY MOUTH ONCE DAILY IN THE EVENING 90 tablet 0  . levothyroxine (SYNTHROID) 112 MCG tablet Take 1 tablet by mouth once daily 90 tablet 1  . Multiple Vitamin (MULTIVITAMIN) tablet Take 1 tablet by mouth daily.    [name] omeprazole (PRILOSEC) 40 MG capsule TAKE 1 CAPSULE BY MOUTH TWICE DAILY BEFORE A MEAL 180 capsule 0  . QUEtiapine (SEROQUEL) 100 MG tablet Take 1 tablet (100 mg total) by mouth at bedtime. 30 tablet 5  . ramipril (ALTACE) 10 MG capsule Take 1 capsule by mouth twice daily 180 capsule 0  . simvastatin (ZOCOR) 40 MG tablet Take 1 tablet by mouth in the evening 90 tablet 0  . venlafaxine XR (EFFEXOR-XR) 150 MG 24 hr capsule Take 1 capsule (150 mg total) by mouth daily with breakfast. 30 capsule 5   No current facility-administered medications on file prior to visit.  Past Medical History:  Diagnosis Date  . Anxiety   . Cataract    Bil  . Depression   . Hypercholesteremia   . Hypertension   . Thyroid disease     Past Surgical History:  Procedure Laterality Date  . CHOLECYSTECTOMY    . COLONOSCOPY    . FOOT SURGERY  2011   right foot  . JOINT REPLACEMENT  2010   left knee  . POLYPECTOMY    . REPLACEMENT TOTAL KNEE Left   . TONSILLECTOMY      Social History   Socioeconomic History  . Marital status: Married    Spouse name: Not on file  . Number of children: 3  . Years of education: 61  . Highest education level: Not on file  Occupational History  . Occupation: Retired   Tobacco Use  . Smoking status: Former  Smoker    Types: Cigarettes    Quit date: 11/04/2009    Years since quitting: 10.1  . Smokeless tobacco: Never Used  Substance and Sexual Activity  . Alcohol use: No  . Drug use: No  . Sexual activity: Never  Other Topics Concern  . Not on file  Social History Narrative   Cares for 34yo husband; has daughter locally who can help if needed   Social Determinants of Health   Financial Resource Strain:   . Difficulty of Paying Living Expenses:   Food Insecurity:   . Worried About Charity fundraiser in the Last Year:   . Arboriculturist in the Last Year:   Transportation Needs:   . Film/video editor (Medical):   [name] Lack of Transportation (Non-Medical):   Physical Activity:   . Days of Exercise per Week:   . Minutes of Exercise per Session:   Stress:   . Feeling of Stress :   Social Connections:   . Frequency of Communication with Friends and Family:   . Frequency of Social Gatherings with Friends and Family:   . Attends Religious Services:   . Active Member of Clubs or Organizations:   . Attends Archivist Meetings:   [name] Marital Status:     Family History  Problem Relation Age of Onset  . Heart disease Mother   . Drug abuse Sister     Review of Systems     Objective:  There were no vitals filed for this visit. BP Readings from Last 3 Encounters:  12/12/19 (!) 164/92  11/20/19 (!) 170/90  11/03/19 110/83   Wt Readings from Last 3 Encounters:  12/12/19 201 lb (91.2 kg)  11/20/19 206 lb (93.4 kg)  10/08/19 210 lb 6.4 oz (95.4 kg)   There is no height or weight on file to calculate BMI.   Physical Exam    Constitutional: Appears well-developed and well-nourished. No distress.  HENT:  Head: Normocephalic and atraumatic.  Neck: Neck supple. No tracheal deviation present. No thyromegaly present.  No cervical lymphadenopathy Cardiovascular: Normal rate, regular rhythm and normal heart sounds.   No murmur heard. No carotid bruit .  No  edema Pulmonary/Chest: Effort normal and breath sounds normal. No respiratory distress. No has no wheezes. No rales.  Skin: Skin is warm and dry. Not diaphoretic.  Psychiatric: Normal mood and affect. Behavior is normal.      Assessment & Plan:    See Problem List for Assessment and Plan of chronic medical problems.    This visit occurred during the SARS-CoV-2 public health emergency.  Safety  protocols were in place, including screening questions prior to the visit, additional usage of staff PPE, and extensive cleaning of exam room while observing appropriate contact time as indicated for disinfecting solutions.

## 2020-01-14 ENCOUNTER — Ambulatory Visit: Payer: Medicare Other | Admitting: Internal Medicine

## 2020-01-15 NOTE — Progress Notes (Signed)
Subjective:    Patient ID: Dawn Kim, female    DOB: [birth year], 74 y.o.   MRN: [id number]  HPI The patient is here for follow up of their chronic medical problems.   Anxiety, depression:  We stopped her sertraline at her last visit and started effexor again. We also increased her seroquel. She does feel better.  She has occasional depression because of the situation she is in with her husband.  She feels her depression and anxiety are overall controlled.   Insomnia  We increased the seroquel at her last visit. She is sleeping better and is happy with the medication dose.     Hypertension:  We increased her amlodipine.  Her BP was low at one point and decreased her ramipril to one pill daily.    GERD:  She is taking her medication daily as prescribed.  She denies any GERD symptoms and feels her GERD is well controlled.      Medications and allergies reviewed with patient and updated if appropriate.  Patient Active Problem List   Diagnosis Date Noted  . 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 Confusion 06/06/2018  . T12 compression fracture ([place]) 03/28/2018  . Midline low back pain without sciatica 03/21/2018  . Shortness of breath 01/06/2018  . Chronic lower back pain 09/19/2017  . 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) 12/09/2016  . Osteopenia 06/21/2016  . Hypothyroidism 10/22/2015  . Depression 10/22/2015  . Anxiety 10/22/2015  . GERD (gastroesophageal reflux disease) 10/22/2015  . Insomnia 10/22/2015  . Bilateral foot pain 10/22/2015  . Bilateral knee pain 10/22/2015  . HTN (hypertension) 01/20/2012  . Hypercholesterolemia 01/20/2012    Current Outpatient Medications on File Prior to Visit  Medication Sig Dispense Refill  . acetaminophen (TYLENOL) 325 MG tablet Take 650 mg by mouth every 6 (six) hours as needed for moderate pain.    [name] ALPRAZolam (XANAX) 0.5 MG tablet TAKE 1 TABLET BY MOUTH THREE TIMES DAILY AS NEEDED FOR ANXIETY . DO NOT EXCEED 3  PER 24 HOURS 90 tablet 0  . amLODipine (NORVASC) 5 MG tablet Take 2 tablets (10 mg total) by mouth daily. 180 tablet 1  . calcium carbonate (OS-CAL - DOSED IN MG OF ELEMENTAL CALCIUM) 1250 (500 Ca) MG tablet Take 1 tablet (500 mg of elemental calcium total) by mouth daily.    . cholecalciferol (VITAMIN D) 1000 UNITS tablet Take 1,000 Units by mouth daily.    . cilostazol (PLETAL) 50 MG tablet Take 0.5 tablets (25 mg total) by mouth 2 (two) times daily. 60 tablet 1  . famotidine (PEPCID) 40 MG tablet TAKE 1 TABLET BY MOUTH AT BEDTIME 90 tablet 0  . ferrous sulfate 325 (65 FE) MG EC tablet Take 325 mg by mouth daily with breakfast.    . fish oil-omega-3 fatty acids 1000 MG capsule Take 1 g by mouth 2 (two) times daily.     . hydrochlorothiazide (HYDRODIURIL) 25 MG tablet Take 1 tablet (25 mg total) by mouth daily. 30 tablet 3  . levocetirizine (XYZAL) 5 MG tablet TAKE 1 TABLET BY MOUTH ONCE DAILY IN THE EVENING 90 tablet 0  . levothyroxine (SYNTHROID) 112 MCG tablet Take 1 tablet by mouth once daily 90 tablet 1  . Multiple Vitamin (MULTIVITAMIN) tablet Take 1 tablet by mouth daily.    [name] omeprazole (PRILOSEC) 40 MG capsule TAKE 1 CAPSULE BY MOUTH TWICE DAILY BEFORE A MEAL 180 capsule 0  . QUEtiapine (SEROQUEL) 100 MG  tablet Take 1 tablet (100 mg total) by mouth at bedtime. 30 tablet 5  . ramipril (ALTACE) 10 MG capsule Take 1 capsule by mouth twice daily (Patient taking differently: daily. Take 1 capsule by mouth daily) 180 capsule 0  . simvastatin (ZOCOR) 40 MG tablet Take 1 tablet by mouth in the evening 90 tablet 0  . venlafaxine XR (EFFEXOR-XR) 150 MG 24 hr capsule Take 1 capsule (150 mg total) by mouth daily with breakfast. 30 capsule 5   No current facility-administered medications on file prior to visit.    Past Medical History:  Diagnosis Date  . Anxiety   . Cataract    Bil  . Depression   . Hypercholesteremia   . Hypertension   . Thyroid disease     Past Surgical History:   Procedure Laterality Date  . CHOLECYSTECTOMY    . COLONOSCOPY    . FOOT SURGERY  2011   right foot  . JOINT REPLACEMENT  2010   left knee  . POLYPECTOMY    . REPLACEMENT TOTAL KNEE Left   . TONSILLECTOMY      Social History   Socioeconomic History  . Marital status: Married    Spouse name: Not on file  . Number of children: 3  . Years of education: 61  . Highest education level: Not on file  Occupational History  . Occupation: Retired   Tobacco Use  . Smoking status: Former Smoker    Types: Cigarettes    Quit date: 11/04/2009    Years since quitting: 10.2  . Smokeless tobacco: Never Used  Substance and Sexual Activity  . Alcohol use: No  . Drug use: No  . Sexual activity: Never  Other Topics Concern  . Not on file  Social History Narrative   Cares for 86yo husband; has daughter locally who can help if needed   Social Determinants of Health   Financial Resource Strain:   . Difficulty of Paying Living Expenses:   Food Insecurity:   . Worried About Charity fundraiser in the Last Year:   . Arboriculturist in the Last Year:   Transportation Needs:   . Film/video editor (Medical):   [name] Lack of Transportation (Non-Medical):   Physical Activity:   . Days of Exercise per Week:   . Minutes of Exercise per Session:   Stress:   . Feeling of Stress :   Social Connections:   . Frequency of Communication with Friends and Family:   . Frequency of Social Gatherings with Friends and Family:   . Attends Religious Services:   . Active Member of Clubs or Organizations:   . Attends Archivist Meetings:   [name] Marital Status:     Family History  Problem Relation Age of Onset  . Heart disease Mother   . Drug abuse Sister     Review of Systems  Constitutional: Negative for chills and fever.  Respiratory: Positive for cough (allergy related). Negative for shortness of breath and wheezing.   Cardiovascular: Negative for chest pain, palpitations and leg swelling.   Neurological: Negative for light-headedness and headaches.       Objective:   Vitals:   01/16/20 1502  BP: 134/80  Pulse: 74  Resp: 16  Temp: 98.3 F (36.8 C)  SpO2: 97%   BP Readings from Last 3 Encounters:  01/16/20 134/80  12/12/19 (!) 164/92  11/20/19 (!) 170/90   Wt Readings from Last 3 Encounters:  01/16/20 196 lb (  88.9 kg)  12/12/19 201 lb (91.2 kg)  11/20/19 206 lb (93.4 kg)   Body mass index is 32.62 kg/m.   Physical Exam    Constitutional: Appears well-developed and well-nourished. No distress.  HENT:  Head: Normocephalic and atraumatic.  Neck: Neck supple. No tracheal deviation present. No thyromegaly present.  No cervical lymphadenopathy Cardiovascular: Normal rate, regular rhythm and normal heart sounds.   No murmur heard. No carotid bruit .  No edema Pulmonary/Chest: Effort normal and breath sounds normal. No respiratory distress. No has no wheezes. No rales.  Skin: Skin is warm and dry. Not diaphoretic.  Psychiatric: Normal mood and affect. Behavior is normal.      Assessment & Plan:    See Problem List for Assessment and Plan of chronic medical problems.    This visit occurred during the SARS-CoV-2 public health emergency.  Safety protocols were in place, including screening questions prior to the visit, additional usage of staff PPE, and extensive cleaning of exam room while observing appropriate contact time as indicated for disinfecting solutions.

## 2020-01-16 ENCOUNTER — Ambulatory Visit (INDEPENDENT_AMBULATORY_CARE_PROVIDER_SITE_OTHER): Payer: Medicare Other | Admitting: Internal Medicine

## 2020-01-16 ENCOUNTER — Encounter: Payer: Self-pay | Admitting: Internal Medicine

## 2020-01-16 ENCOUNTER — Other Ambulatory Visit: Payer: Self-pay

## 2020-01-16 DIAGNOSIS — I10 Essential (primary) hypertension: Secondary | ICD-10-CM | POA: Diagnosis not present

## 2020-01-16 DIAGNOSIS — F419 Anxiety disorder, unspecified: Secondary | ICD-10-CM

## 2020-01-16 DIAGNOSIS — G4709 Other insomnia: Secondary | ICD-10-CM | POA: Diagnosis not present

## 2020-01-16 DIAGNOSIS — F3289 Other specified depressive episodes: Secondary | ICD-10-CM

## 2020-01-16 DIAGNOSIS — K219 Gastro-esophageal reflux disease without esophagitis: Secondary | ICD-10-CM

## 2020-01-16 NOTE — Assessment & Plan Note (Signed)
Chronic BP well controlled Current regimen effective and well tolerated Continue current medications at current doses cmp  

## 2020-01-16 NOTE — Assessment & Plan Note (Addendum)
Chronic Much improved Controlled Continue current dose of medication: effexor xr 150 mg daily seroquel 100 mg nightly FU in 3 months

## 2020-01-16 NOTE — Patient Instructions (Signed)
    Medications reviewed and updated.  Changes include :   none  Your prescription(s) have been submitted to your pharmacy. Please take as directed and contact our office if you believe you are having problem(s) with the medication(s).     Please followup in 4 months

## 2020-01-16 NOTE — Assessment & Plan Note (Addendum)
Chronic Improved significantly Controlled Continue current dose of medication: effexor xr 150 mg daily seroquel 100 mg nightly Xanax 0.5 mg TID prn FU in 3 months

## 2020-01-16 NOTE — Assessment & Plan Note (Signed)
Chronic GERD controlled Continue daily medication  

## 2020-01-16 NOTE — Assessment & Plan Note (Signed)
Chronic Sleep improved with increasing seroquel to 100 mg  Continue current dose

## 2020-01-31 ENCOUNTER — Other Ambulatory Visit: Payer: Self-pay | Admitting: Internal Medicine

## 2020-02-04 ENCOUNTER — Telehealth: Payer: Self-pay | Admitting: Internal Medicine

## 2020-02-04 NOTE — Telephone Encounter (Signed)
    1.Medication Requested: ALPRAZolam (XANAX) 0.5 MG tablet  2. Pharmacy (Name, Street, [place]):[place], [address]  3. On Med List: yes  4. Last Visit with PCP: 01/16/20  5. Next visit date with PCP: 03/13/20   Agent: Please be advised that RX refills may take up to 3 business days. We ask that you follow-up with your pharmacy.

## 2020-02-05 ENCOUNTER — Other Ambulatory Visit: Payer: Self-pay | Admitting: Internal Medicine

## 2020-02-05 NOTE — Telephone Encounter (Signed)
Responded by refill request.

## 2020-02-08 ENCOUNTER — Other Ambulatory Visit: Payer: Self-pay | Admitting: Internal Medicine

## 2020-02-08 NOTE — Telephone Encounter (Signed)
1.Medication Requested:ALPRAZolam (XANAX) 0.5 MG tablet  2. Pharmacy (Name, Street, [place]):[place], [address]  3. On Med List: Yes   4. Last Visit with PCP: 4.14.2021   5. Next visit date with PCP: 6.10.2021    Agent: Please be advised that RX refills may take up to 3 business days. We ask that you follow-up with your pharmacy.

## 2020-02-11 ENCOUNTER — Other Ambulatory Visit: Payer: Self-pay

## 2020-02-11 MED ORDER — ALPRAZOLAM 0.5 MG PO TABS: ORAL_TABLET | ORAL | 0 refills | Status: DC

## 2020-02-11 NOTE — Addendum Note (Signed)
Addended by: Binnie Rail on: 02/11/2020 09:48 AM   Modules accepted: Orders

## 2020-03-04 ENCOUNTER — Telehealth: Payer: Self-pay | Admitting: Internal Medicine

## 2020-03-10 ENCOUNTER — Other Ambulatory Visit: Payer: Self-pay | Admitting: Internal Medicine

## 2020-03-10 MED ORDER — ALPRAZOLAM 0.5 MG PO TABS: ORAL_TABLET | ORAL | 0 refills | Status: DC

## 2020-03-10 NOTE — Telephone Encounter (Signed)
    Patient calling to request refill on ALPRAZolam (XANAX) 0.5 MG tablet

## 2020-03-10 NOTE — Addendum Note (Signed)
Addended by: Binnie Rail on: 03/10/2020 11:57 AM   Modules accepted: Orders

## 2020-03-13 ENCOUNTER — Ambulatory Visit: Payer: Medicare Other | Admitting: Internal Medicine

## 2020-04-01 ENCOUNTER — Other Ambulatory Visit: Payer: Self-pay | Admitting: Internal Medicine

## 2020-04-07 ENCOUNTER — Other Ambulatory Visit: Payer: Self-pay | Admitting: Internal Medicine

## 2020-04-08 NOTE — Telephone Encounter (Signed)
New message:   1.Medication Requested: ALPRAZolam (XANAX) 0.5 MG tablet 2. Pharmacy (Name, Street, [place]): [place], [address] 3. On Med List: Yes  4. Last Visit with PCP:   5. Next visit date with PCP:   Agent: Please be advised that RX refills may take up to 3 business days. We ask that you follow-up with your pharmacy.

## 2020-04-09 ENCOUNTER — Ambulatory Visit: Payer: Medicare Other

## 2020-04-10 ENCOUNTER — Ambulatory Visit: Payer: Medicare Other

## 2020-05-07 ENCOUNTER — Other Ambulatory Visit: Payer: Self-pay | Admitting: Internal Medicine

## 2020-05-07 ENCOUNTER — Telehealth: Payer: Self-pay | Admitting: Internal Medicine

## 2020-05-07 MED ORDER — ALPRAZOLAM 0.5 MG PO TABS: ORAL_TABLET | ORAL | 0 refills | Status: DC

## 2020-05-07 NOTE — Telephone Encounter (Signed)
New message:  1.Medication Requested: ALPRAZolam (XANAX) 0.5 MG tablet 2. Pharmacy (Name, Street, [place]): [place], [address] 3. On Med List: Yes  4. Last Visit with PCP: 01/16/20  5. Next visit date with PCP: 05/21/20   Agent: Please be advised that RX refills may take up to 3 business days. We ask that you follow-up with your pharmacy.

## 2020-05-07 NOTE — Telephone Encounter (Signed)
Check [place] registry last filled 04/08/2020.Marland KitchenJohny Kim

## 2020-05-20 NOTE — Patient Instructions (Signed)
  Blood work was ordered.     Medications reviewed and updated.  Changes include :     Your prescription(s) have been submitted to your pharmacy. Please take as directed and contact our office if you believe you are having problem(s) with the medication(s).  A referral was ordered for        Someone from their office will call you to schedule an appointment.    Please followup in 6 months   

## 2020-05-20 NOTE — Progress Notes (Signed)
Subjective:    Patient ID: Dawn Kim, female    DOB: Jul 14, 1946, 74 y.o.   MRN: [id number]  HPI The patient is here for follow up of their chronic medical problems, including anxiety, depression, insomnia, GERD, htn, hypothyroidism, hyperlipidemia    Medications and allergies reviewed with patient and updated if appropriate.  Patient Active Problem List   Diagnosis Date Noted  . 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 Confusion 06/06/2018  . T12 compression fracture ([place]) 03/28/2018  . Midline low back pain without sciatica 03/21/2018  . Shortness of breath 01/06/2018  . Chronic lower back pain 09/19/2017  . 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 Osteopenia 06/21/2016  . Hypothyroidism 10/22/2015  . Depression 10/22/2015  . Anxiety 10/22/2015  . GERD (gastroesophageal reflux disease) 10/22/2015  . Insomnia 10/22/2015  . Bilateral foot pain 10/22/2015  . Bilateral knee pain 10/22/2015  . HTN (hypertension) 01/20/2012  . Hypercholesterolemia 01/20/2012    Current Outpatient Medications on File Prior to Visit  Medication Sig Dispense Refill  . acetaminophen (TYLENOL) 325 MG tablet Take 650 mg by mouth every 6 (six) hours as needed for moderate pain.    [name] ALPRAZolam (XANAX) 0.5 MG tablet TAKE 1 TABLET BY MOUTH THREE TIMES DAILY AS NEEDED FOR ANXIETY . DO NOT EXCEED 3 PER 24 HOURS 90 tablet 0  . amLODipine (NORVASC) 5 MG tablet Take 1 tablet by mouth once daily 90 tablet 0  . calcium carbonate (OS-CAL - DOSED IN MG OF ELEMENTAL CALCIUM) 1250 (500 Ca) MG tablet Take 1 tablet (500 mg of elemental calcium total) by mouth daily.    . cholecalciferol (VITAMIN D) 1000 UNITS tablet Take 1,000 Units by mouth daily.    . cilostazol (PLETAL) 50 MG tablet Take 0.5 tablets (25 mg total) by mouth 2 (two) times daily. 60 tablet 1  . famotidine (PEPCID) 40 MG tablet TAKE 1 TABLET BY MOUTH AT BEDTIME 90 tablet 0  . ferrous sulfate 325 (65 FE) MG EC tablet Take  325 mg by mouth daily with breakfast.    . fish oil-omega-3 fatty acids 1000 MG capsule Take 1 g by mouth 2 (two) times daily.     . hydrochlorothiazide (HYDRODIURIL) 25 MG tablet Take 1 tablet by mouth once daily 30 tablet 1  . levocetirizine (XYZAL) 5 MG tablet TAKE 1 TABLET BY MOUTH ONCE DAILY IN THE EVENING 90 tablet 0  . levothyroxine (SYNTHROID) 112 MCG tablet Take 1 tablet by mouth once daily 90 tablet 0  . Multiple Vitamin (MULTIVITAMIN) tablet Take 1 tablet by mouth daily.    [name] omeprazole (PRILOSEC) 40 MG capsule TAKE 1 CAPSULE BY MOUTH TWICE DAILY BEFORE A MEAL 180 capsule 0  . QUEtiapine (SEROQUEL) 100 MG tablet Take 1 tablet (100 mg total) by mouth at bedtime. 30 tablet 5  . ramipril (ALTACE) 10 MG capsule Take 1 capsule by mouth twice daily (Patient taking differently: daily. Take 1 capsule by mouth daily) 180 capsule 0  . simvastatin (ZOCOR) 40 MG tablet Take 1 tablet by mouth in the evening 90 tablet 0  . venlafaxine XR (EFFEXOR-XR) 150 MG 24 hr capsule Take 1 capsule (150 mg total) by mouth daily with breakfast. 30 capsule 5   No current facility-administered medications on file prior to visit.    Past Medical History:  Diagnosis Date  . Anxiety   . Cataract    Bil  . Depression   . Hypercholesteremia   .  Hypertension   . Thyroid disease     Past Surgical History:  Procedure Laterality Date  . CHOLECYSTECTOMY    . COLONOSCOPY    . FOOT SURGERY  2011   right foot  . JOINT REPLACEMENT  2010   left knee  . POLYPECTOMY    . REPLACEMENT TOTAL KNEE Left   . TONSILLECTOMY      Social History   Socioeconomic History  . Marital status: Married    Spouse name: Not on file  . Number of children: 3  . Years of education: 43  . Highest education level: Not on file  Occupational History  . Occupation: Retired   Tobacco Use  . Smoking status: Former Smoker    Types: Cigarettes    Quit date: 11/04/2009    Years since quitting: 10.5  . Smokeless tobacco: Never Used    Vaping Use  . Vaping Use: Never used  Substance and Sexual Activity  . Alcohol use: No  . Drug use: No  . Sexual activity: Never  Other Topics Concern  . Not on file  Social History Narrative   Cares for 10yo husband; has daughter locally who can help if needed   Social Determinants of Health   Financial Resource Strain:   . Difficulty of Paying Living Expenses:   Food Insecurity:   . Worried About Charity fundraiser in the Last Year:   . Arboriculturist in the Last Year:   Transportation Needs:   . Film/video editor (Medical):   [name] Lack of Transportation (Non-Medical):   Physical Activity:   . Days of Exercise per Week:   . Minutes of Exercise per Session:   Stress:   . Feeling of Stress :   Social Connections:   . Frequency of Communication with Friends and Family:   . Frequency of Social Gatherings with Friends and Family:   . Attends Religious Services:   . Active Member of Clubs or Organizations:   . Attends Archivist Meetings:   [name] Marital Status:     Family History  Problem Relation Age of Onset  . Heart disease Mother   . Drug abuse Sister     Review of Systems     Objective:  There were no vitals filed for this visit. BP Readings from Last 3 Encounters:  01/16/20 134/80  12/12/19 (!) 164/92  11/20/19 (!) 170/90   Wt Readings from Last 3 Encounters:  01/16/20 196 lb (88.9 kg)  12/12/19 201 lb (91.2 kg)  11/20/19 206 lb (93.4 kg)   There is no height or weight on file to calculate BMI.   Physical Exam    Constitutional: Appears well-developed and well-nourished. No distress.  HENT:  Head: Normocephalic and atraumatic.  Neck: Neck supple. No tracheal deviation present. No thyromegaly present.  No cervical lymphadenopathy Cardiovascular: Normal rate, regular rhythm and normal heart sounds.   No murmur heard. No carotid bruit .  No edema Pulmonary/Chest: Effort normal and breath sounds normal. No respiratory distress. No has no  wheezes. No rales.  Skin: Skin is warm and dry. Not diaphoretic.  Psychiatric: Normal mood and affect. Behavior is normal.      Assessment & Plan:    See Problem List for Assessment and Plan of chronic medical problems.    This visit occurred during the SARS-CoV-2 public health emergency.  Safety protocols were in place, including screening questions prior to the visit, additional usage of staff PPE, and extensive cleaning  of exam room while observing appropriate contact time as indicated for disinfecting solutions.    This encounter was created in error - please disregard.

## 2020-05-21 ENCOUNTER — Encounter: Payer: Medicare Other | Admitting: Internal Medicine

## 2020-05-26 ENCOUNTER — Other Ambulatory Visit: Payer: Self-pay | Admitting: Internal Medicine

## 2020-05-27 ENCOUNTER — Telehealth: Payer: Self-pay

## 2020-05-27 MED ORDER — QUETIAPINE FUMARATE 100 MG PO TABS: 100.0000 mg | ORAL_TABLET | Freq: Every day | ORAL | 0 refills | Status: DC

## 2020-05-27 NOTE — Telephone Encounter (Signed)
1.Medication Requested:QUEtiapine (SEROQUEL) 100 MG tablet  2. Pharmacy (Name, Street, [place]):[facility], [address]  3. On Med List: Yes   4. Last Visit with PCP:   5. Next visit date with PCP: 9.7.21    Agent: Please be advised that RX refills may take up to 3 business days. We ask that you follow-up with your pharmacy.

## 2020-05-31 ENCOUNTER — Other Ambulatory Visit: Payer: Self-pay | Admitting: Internal Medicine

## 2020-06-04 ENCOUNTER — Telehealth: Payer: Self-pay | Admitting: Internal Medicine

## 2020-06-04 MED ORDER — ALPRAZOLAM 0.5 MG PO TABS: ORAL_TABLET | ORAL | 0 refills | Status: DC

## 2020-06-04 NOTE — Telephone Encounter (Signed)
Check [place] registry last filled 05/07/2020.Marland KitchenJohny Kim

## 2020-06-04 NOTE — Telephone Encounter (Signed)
   1.Medication Requested:ALPRAZolam (XANAX) 0.5 MG tablet  2. Pharmacy (Name, Street, [place]): [facility], [address]  3. On Med List: yes  4. Last Visit with PCP: 01/16/20  5. Next visit date with PCP: 06/10/20   Agent: Please be advised that RX refills may take up to 3 business days. We ask that you follow-up with your pharmacy.

## 2020-06-09 ENCOUNTER — Encounter: Payer: Self-pay | Admitting: Internal Medicine

## 2020-06-09 NOTE — Patient Instructions (Signed)
  Blood work was ordered.     Medications reviewed and updated.  Changes include :     Your prescription(s) have been submitted to your pharmacy. Please take as directed and contact our office if you believe you are having problem(s) with the medication(s).  A referral was ordered for        Someone from their office will call you to schedule an appointment.    Please followup in 6 months   

## 2020-06-09 NOTE — Progress Notes (Signed)
Subjective:    Patient ID: Dawn Kim, female    DOB: July 07, 1946, 74 y.o.   MRN: [id number]  HPI The patient is here for follow up of their chronic medical problems, including anxiety, depression, insomnia, GERD, htn, hypothyroidism, hyperlipidemia  She is taking all of her medications as prescribed.      Medications and allergies reviewed with patient and updated if appropriate.  Patient Active Problem List   Diagnosis Date Noted  . 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 Confusion 06/06/2018  . T12 compression fracture ([place]) 03/28/2018  . Midline low back pain without sciatica 03/21/2018  . Shortness of breath 01/06/2018  . Chronic lower back pain 09/19/2017  . 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 Osteopenia 06/21/2016  . Hypothyroidism 10/22/2015  . Depression 10/22/2015  . Anxiety 10/22/2015  . GERD (gastroesophageal reflux disease) 10/22/2015  . Insomnia 10/22/2015  . Bilateral foot pain 10/22/2015  . Bilateral knee pain 10/22/2015  . HTN (hypertension) 01/20/2012  . Hypercholesterolemia 01/20/2012    Current Outpatient Medications on File Prior to Visit  Medication Sig Dispense Refill  . acetaminophen (TYLENOL) 325 MG tablet Take 650 mg by mouth every 6 (six) hours as needed for moderate pain.    [name] ALPRAZolam (XANAX) 0.5 MG tablet TAKE 1 TABLET BY MOUTH THREE TIMES DAILY AS NEEDED FOR ANXIETY . DO NOT EXCEED 3 PER 24 HOURS 90 tablet 0  . amLODipine (NORVASC) 5 MG tablet Take 1 tablet by mouth once daily 90 tablet 0  . calcium carbonate (OS-CAL - DOSED IN MG OF ELEMENTAL CALCIUM) 1250 (500 Ca) MG tablet Take 1 tablet (500 mg of elemental calcium total) by mouth daily.    . cholecalciferol (VITAMIN D) 1000 UNITS tablet Take 1,000 Units by mouth daily.    . cilostazol (PLETAL) 50 MG tablet Take 0.5 tablets (25 mg total) by mouth 2 (two) times daily. 60 tablet 1  . EUTHYROX 112 MCG tablet Take 1 tablet by mouth once daily 90 tablet 0    . famotidine (PEPCID) 40 MG tablet TAKE 1 TABLET BY MOUTH AT BEDTIME 90 tablet 0  . ferrous sulfate 325 (65 FE) MG EC tablet Take 325 mg by mouth daily with breakfast.    . fish oil-omega-3 fatty acids 1000 MG capsule Take 1 g by mouth 2 (two) times daily.     . hydrochlorothiazide (HYDRODIURIL) 25 MG tablet Take 1 tablet by mouth once daily 30 tablet 0  . levocetirizine (XYZAL) 5 MG tablet TAKE 1 TABLET BY MOUTH ONCE DAILY IN THE EVENING 90 tablet 0  . Multiple Vitamin (MULTIVITAMIN) tablet Take 1 tablet by mouth daily.    [name] omeprazole (PRILOSEC) 40 MG capsule TAKE 1 CAPSULE BY MOUTH TWICE DAILY BEFORE A MEAL 180 capsule 0  . QUEtiapine (SEROQUEL) 100 MG tablet Take 1 tablet (100 mg total) by mouth at bedtime. 30 tablet 0  . ramipril (ALTACE) 10 MG capsule Take 1 capsule by mouth twice daily (Patient taking differently: daily. Take 1 capsule by mouth daily) 180 capsule 0  . simvastatin (ZOCOR) 40 MG tablet Take 1 tablet by mouth in the evening 90 tablet 0  . venlafaxine XR (EFFEXOR-XR) 150 MG 24 hr capsule Take 1 capsule (150 mg total) by mouth daily with breakfast. 30 capsule 5   No current facility-administered medications on file prior to visit.    Past Medical History:  Diagnosis Date  . Anxiety   . Cataract  Bil  . Depression   . Hypercholesteremia   . Hypertension   . Thyroid disease     Past Surgical History:  Procedure Laterality Date  . CHOLECYSTECTOMY    . COLONOSCOPY    . FOOT SURGERY  2011   right foot  . JOINT REPLACEMENT  2010   left knee  . POLYPECTOMY    . REPLACEMENT TOTAL KNEE Left   . TONSILLECTOMY      Social History   Socioeconomic History  . Marital status: Married    Spouse name: Not on file  . Number of children: 3  . Years of education: 73  . Highest education level: Not on file  Occupational History  . Occupation: Retired   Tobacco Use  . Smoking status: Former Smoker    Types: Cigarettes    Quit date: 11/04/2009    Years since  quitting: 10.6  . Smokeless tobacco: Never Used  Vaping Use  . Vaping Use: Never used  Substance and Sexual Activity  . Alcohol use: No  . Drug use: No  . Sexual activity: Never  Other Topics Concern  . Not on file  Social History Narrative   Cares for 31yo husband; has daughter locally who can help if needed   Social Determinants of Health   Financial Resource Strain:   . Difficulty of Paying Living Expenses: Not on file  Food Insecurity:   . Worried About Charity fundraiser in the Last Year: Not on file  . Ran Out of Food in the Last Year: Not on file  Transportation Needs:   . Lack of Transportation (Medical): Not on file  . Lack of Transportation (Non-Medical): Not on file  Physical Activity:   . Days of Exercise per Week: Not on file  . Minutes of Exercise per Session: Not on file  Stress:   . Feeling of Stress : Not on file  Social Connections:   . Frequency of Communication with Friends and Family: Not on file  . Frequency of Social Gatherings with Friends and Family: Not on file  . Attends Religious Services: Not on file  . Active Member of Clubs or Organizations: Not on file  . Attends Archivist Meetings: Not on file  . Marital Status: Not on file    Family History  Problem Relation Age of Onset  . Heart disease Mother   . Drug abuse Sister     Review of Systems     Objective:  There were no vitals filed for this visit. BP Readings from Last 3 Encounters:  01/16/20 134/80  12/12/19 (!) 164/92  11/20/19 (!) 170/90   Wt Readings from Last 3 Encounters:  01/16/20 196 lb (88.9 kg)  12/12/19 201 lb (91.2 kg)  11/20/19 206 lb (93.4 kg)   There is no height or weight on file to calculate BMI.   Physical Exam    Constitutional: Appears well-developed and well-nourished. No distress.  HENT:  Head: Normocephalic and atraumatic.  Neck: Neck supple. No tracheal deviation present. No thyromegaly present.  No cervical  lymphadenopathy Cardiovascular: Normal rate, regular rhythm and normal heart sounds.   No murmur heard. No carotid bruit .  No edema Pulmonary/Chest: Effort normal and breath sounds normal. No respiratory distress. No has no wheezes. No rales.  Skin: Skin is warm and dry. Not diaphoretic.  Psychiatric: Normal mood and affect. Behavior is normal.      Assessment & Plan:    See Problem List for Assessment and  Plan of chronic medical problems.    This visit occurred during the SARS-CoV-2 public health emergency.  Safety protocols were in place, including screening questions prior to the visit, additional usage of staff PPE, and extensive cleaning of exam room while observing appropriate contact time as indicated for disinfecting solutions.    This encounter was created in error - please disregard.

## 2020-06-10 ENCOUNTER — Encounter: Payer: Medicare Other | Admitting: Internal Medicine

## 2020-06-18 ENCOUNTER — Encounter: Payer: Self-pay | Admitting: Internal Medicine

## 2020-06-18 ENCOUNTER — Encounter (HOSPITAL_COMMUNITY): Payer: Self-pay | Admitting: Emergency Medicine

## 2020-06-18 ENCOUNTER — Encounter: Payer: Medicare Other | Admitting: Internal Medicine

## 2020-06-18 ENCOUNTER — Other Ambulatory Visit: Payer: Self-pay

## 2020-06-18 ENCOUNTER — Emergency Department (HOSPITAL_COMMUNITY): Payer: Medicare Other

## 2020-06-18 ENCOUNTER — Emergency Department (HOSPITAL_COMMUNITY)
Admission: EM | Admit: 2020-06-18 | Discharge: 2020-06-19 | Disposition: A | Discharge status: Home / Self Care | Payer: Medicare Other | Attending: Emergency Medicine | Admitting: Emergency Medicine

## 2020-06-18 DIAGNOSIS — S8292XA Unspecified fracture of left lower leg, initial encounter for closed fracture: Secondary | ICD-10-CM | POA: Diagnosis not present

## 2020-06-18 DIAGNOSIS — R079 Chest pain, unspecified: Secondary | ICD-10-CM | POA: Diagnosis not present

## 2020-06-18 DIAGNOSIS — S99911A Unspecified injury of right ankle, initial encounter: Secondary | ICD-10-CM | POA: Diagnosis present

## 2020-06-18 DIAGNOSIS — R52 Pain, unspecified: Secondary | ICD-10-CM | POA: Diagnosis not present

## 2020-06-18 DIAGNOSIS — Y92003 Bedroom of unspecified non-institutional (private) residence as the place of occurrence of the external cause: Secondary | ICD-10-CM | POA: Diagnosis not present

## 2020-06-18 DIAGNOSIS — S8291XA Unspecified fracture of right lower leg, initial encounter for closed fracture: Secondary | ICD-10-CM | POA: Insufficient documentation

## 2020-06-18 DIAGNOSIS — Z87891 Personal history of nicotine dependence: Secondary | ICD-10-CM | POA: Insufficient documentation

## 2020-06-18 DIAGNOSIS — R600 Localized edema: Secondary | ICD-10-CM | POA: Diagnosis not present

## 2020-06-18 DIAGNOSIS — I10 Essential (primary) hypertension: Secondary | ICD-10-CM | POA: Diagnosis not present

## 2020-06-18 DIAGNOSIS — S42202A Unspecified fracture of upper end of left humerus, initial encounter for closed fracture: Secondary | ICD-10-CM | POA: Insufficient documentation

## 2020-06-18 DIAGNOSIS — M25571 Pain in right ankle and joints of right foot: Secondary | ICD-10-CM | POA: Diagnosis not present

## 2020-06-18 DIAGNOSIS — W06XXXA Fall from bed, initial encounter: Secondary | ICD-10-CM | POA: Diagnosis not present

## 2020-06-18 DIAGNOSIS — Z79899 Other long term (current) drug therapy: Secondary | ICD-10-CM | POA: Insufficient documentation

## 2020-06-18 DIAGNOSIS — W19XXXA Unspecified fall, initial encounter: Secondary | ICD-10-CM

## 2020-06-18 DIAGNOSIS — R609 Edema, unspecified: Secondary | ICD-10-CM | POA: Diagnosis not present

## 2020-06-18 DIAGNOSIS — M7989 Other specified soft tissue disorders: Secondary | ICD-10-CM | POA: Diagnosis not present

## 2020-06-18 DIAGNOSIS — Z20822 Contact with and (suspected) exposure to covid-19: Secondary | ICD-10-CM | POA: Insufficient documentation

## 2020-06-18 DIAGNOSIS — S82891A Other fracture of right lower leg, initial encounter for closed fracture: Secondary | ICD-10-CM

## 2020-06-18 DIAGNOSIS — S82391A Other fracture of lower end of right tibia, initial encounter for closed fracture: Secondary | ICD-10-CM | POA: Diagnosis not present

## 2020-06-18 DIAGNOSIS — E039 Hypothyroidism, unspecified: Secondary | ICD-10-CM | POA: Diagnosis not present

## 2020-06-18 DIAGNOSIS — S8251XA Displaced fracture of medial malleolus of right tibia, initial encounter for closed fracture: Secondary | ICD-10-CM | POA: Diagnosis not present

## 2020-06-18 DIAGNOSIS — S42252A Displaced fracture of greater tuberosity of left humerus, initial encounter for closed fracture: Secondary | ICD-10-CM | POA: Diagnosis not present

## 2020-06-18 DIAGNOSIS — Z96652 Presence of left artificial knee joint: Secondary | ICD-10-CM | POA: Diagnosis not present

## 2020-06-18 DIAGNOSIS — D649 Anemia, unspecified: Secondary | ICD-10-CM | POA: Insufficient documentation

## 2020-06-18 DIAGNOSIS — S42292A Other displaced fracture of upper end of left humerus, initial encounter for closed fracture: Secondary | ICD-10-CM

## 2020-06-18 LAB — SARS CORONAVIRUS 2 BY RT PCR (HOSPITAL ORDER, PERFORMED IN ~~LOC~~ HOSPITAL LAB): SARS Coronavirus 2: NEGATIVE

## 2020-06-18 MED ORDER — TRAMADOL 5 MG/ML ORAL SUSPENSION: 50.0000 mg | Freq: Four times a day (QID) | ORAL | Status: DC

## 2020-06-18 MED ORDER — ACETAMINOPHEN 325 MG PO TABS
650.0000 mg | ORAL_TABLET | Freq: Once | ORAL | Status: AC
  Administered 2020-06-18: 650 mg via ORAL
  Filled 2020-06-18: qty 2

## 2020-06-18 MED ORDER — TRAMADOL HCL 50 MG PO TABS
50.0000 mg | ORAL_TABLET | Freq: Four times a day (QID) | ORAL | Status: DC
  Administered 2020-06-18 – 2020-06-19 (×3): 50 mg via ORAL
  Filled 2020-06-18 (×3): qty 1

## 2020-06-18 NOTE — ED Triage Notes (Signed)
Patient is complaining of left shoulder pain that happened from a fall two weeks ago and right ankle pain from a fall this morning. Patient can not lift her right arm.

## 2020-06-18 NOTE — ED Provider Notes (Signed)
  Face-to-face evaluation   History: She is here for evaluation of 2 falls, one, 3 weeks ago when she injured her left shoulder, and another fall today when she injured her right ankle.  These are separate incidents and not connected in any way.  Both times she describes mechanical falls.  Physical exam: Alert obese elderly female.  Left shoulder tender to palpation without deformity.  She guards against movement of the left shoulder.  Right ankle tender and swollen, and guards against movement secondary to pain.  No other injury to the large joints of the arms or legs.  Medical screening examination/treatment/procedure(s) were conducted as a shared visit with non-physician practitioner(s) and myself.  I personally evaluated the patient during the encounter    Daleen Bo, MD 06/18/20 [phone]

## 2020-06-18 NOTE — ED Provider Notes (Signed)
[facility] DEPT Provider Note   CSN: [id number] Arrival date & time: 06/18/20  7939     History Chief Complaint  Patient presents with  . Fall    Dawn Kim is a 74 y.o. female.  HPI   Patient with significant medical history of anxiety, depression, hypertension presents to the emergency department after having a mechanical fall.  Patient states she was getting out of bed and her feet slid out from underneath her causing her to fall and landing on her right ankle.  She states she heard a loud pop and  now is experiencing swelling and severe ankle pain.  She is unable to apply weight on that side.  She also mentions that she has left shoulder pain, she states she fell 3 weeks ago while she was in the kitchen, tripping over a rug and falling on her left side.  She has been unable to flex or extend at her shoulder.  She denies hitting her head, losing consciousness, being on anticoags.  Patient states she has been taking ibuprofen and Tylenol without any relief.  She states that moving her left arm or placing weight on her right ankle makes the pain worse.  Patient denies headache, fever, chills, shortness of breath, chest pain, abdominal pain, dysuria, worsening pedal edema.  Past Medical History:  Diagnosis Date  . Anxiety   . Cataract    Bil  . Depression   . Hypercholesteremia   . Hypertension   . Thyroid disease     Patient Active Problem List   Diagnosis Date Noted  . Anemia 06/18/2020  . 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 Confusion 06/06/2018  . T12 compression fracture ([place]) 03/28/2018  . Midline low back pain without sciatica 03/21/2018  . Shortness of breath 01/06/2018  . Chronic lower back pain 09/19/2017  . 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 Osteopenia 06/21/2016  . Hypothyroidism 10/22/2015  . Depression 10/22/2015  . Anxiety 10/22/2015  . GERD (gastroesophageal reflux disease) 10/22/2015  .  Insomnia 10/22/2015  . Bilateral foot pain 10/22/2015  . Bilateral knee pain 10/22/2015  . HTN (hypertension) 01/20/2012  . Hypercholesterolemia 01/20/2012    Past Surgical History:  Procedure Laterality Date  . CHOLECYSTECTOMY    . COLONOSCOPY    . FOOT SURGERY  2011   right foot  . JOINT REPLACEMENT  2010   left knee  . POLYPECTOMY    . REPLACEMENT TOTAL KNEE Left   . TONSILLECTOMY       OB History   No obstetric history on file.     Family History  Problem Relation Age of Onset  . Heart disease Mother   . Drug abuse Sister     Social History   Tobacco Use  . Smoking status: Former Smoker    Types: Cigarettes    Quit date: 11/04/2009    Years since quitting: 10.6  . Smokeless tobacco: Never Used  Vaping Use  . Vaping Use: Never used  Substance Use Topics  . Alcohol use: No  . Drug use: No    Home Medications Prior to Admission medications   Medication Sig Start Date End Date Taking? Authorizing Provider  acetaminophen (TYLENOL) 325 MG tablet Take 650 mg by mouth every 6 (six) hours as needed for moderate pain.    [provider]  ALPRAZolam (XANAX) 0.5 MG tablet TAKE 1 TABLET BY MOUTH THREE TIMES DAILY AS NEEDED FOR ANXIETY . DO  NOT EXCEED 3 PER 24 HOURS 06/04/20   Burns, Claudina Lick, MD  amLODipine (NORVASC) 5 MG tablet Take 1 tablet by mouth once daily 05/26/20   Binnie Rail, MD  calcium carbonate (OS-CAL - DOSED IN MG OF ELEMENTAL CALCIUM) 1250 (500 Ca) MG tablet Take 1 tablet (500 mg of elemental calcium total) by mouth daily. 05/31/16   Nche, Charlene Brooke, NP  cholecalciferol (VITAMIN D) 1000 UNITS tablet Take 1,000 Units by mouth daily.    [provider]  cilostazol (PLETAL) 50 MG tablet Take 0.5 tablets (25 mg total) by mouth 2 (two) times daily. 11/20/18   Lyndal Pulley, DO  EUTHYROX 112 MCG tablet Take 1 tablet by mouth once daily 05/26/20   Binnie Rail, MD  famotidine (PEPCID) 40 MG tablet TAKE 1 TABLET BY MOUTH AT BEDTIME 05/26/20    Binnie Rail, MD  ferrous sulfate 325 (65 FE) MG EC tablet Take 325 mg by mouth daily with breakfast.    [provider]  fish oil-omega-3 fatty acids 1000 MG capsule Take 1 g by mouth 2 (two) times daily.     [provider]  hydrochlorothiazide (HYDRODIURIL) 25 MG tablet Take 1 tablet by mouth once daily 05/26/20   Binnie Rail, MD  levocetirizine (XYZAL) 5 MG tablet TAKE 1 TABLET BY MOUTH ONCE DAILY IN THE EVENING 03/04/20   Binnie Rail, MD  Multiple Vitamin (MULTIVITAMIN) tablet Take 1 tablet by mouth daily.    [provider]  omeprazole (PRILOSEC) 40 MG capsule TAKE 1 CAPSULE BY MOUTH TWICE DAILY BEFORE A MEAL 06/02/20   Burns, Claudina Lick, MD  QUEtiapine (SEROQUEL) 100 MG tablet Take 1 tablet (100 mg total) by mouth at bedtime. 05/27/20   Binnie Rail, MD  ramipril (ALTACE) 10 MG capsule Take 1 capsule by mouth twice daily Patient taking differently: daily. Take 1 capsule by mouth daily 08/13/19   Binnie Rail, MD  simvastatin (ZOCOR) 40 MG tablet Take 1 tablet by mouth in the evening 06/02/20   Binnie Rail, MD  venlafaxine XR (EFFEXOR-XR) 150 MG 24 hr capsule Take 1 capsule (150 mg total) by mouth daily with breakfast. 01/03/20   Binnie Rail, MD    Allergies    Gabapentin, Lunesta [eszopiclone], Penicillins, Sonata [zaleplon], Ambien [zolpidem], and Amoxicillin  Review of Systems   Review of Systems  Constitutional: Negative for chills and fever.  HENT: Negative for congestion.   Eyes: Negative for visual disturbance.  Respiratory: Negative for cough and shortness of breath.   Cardiovascular: Negative for chest pain and palpitations.  Gastrointestinal: Negative for abdominal pain, diarrhea, nausea and vomiting.  Genitourinary: Negative for enuresis, flank pain, frequency and vaginal discharge.  Musculoskeletal: Negative for back pain.       Admits to left shoulder pain and right ankle pain.  Skin: Negative for rash.  Neurological: Negative for  dizziness and headaches.  Hematological: Does not bruise/bleed easily.    Physical Exam Updated Vital Signs BP (!) 123/41 (BP Location: Left Arm)   Pulse 70   Temp 98 F (36.7 C) (Oral)   Resp 16   Ht 5\' 5"  (1.651 m)   Wt 84.4 kg   SpO2 97%   BMI 30.95 kg/m   Physical Exam Vitals and nursing note reviewed.  Constitutional:      General: She is not in acute distress.    Appearance: Normal appearance. She is not ill-appearing or diaphoretic.  HENT:  Head: Normocephalic and atraumatic.     Nose: No congestion or rhinorrhea.     Mouth/Throat:     Mouth: Mucous membranes are moist.     Pharynx: Oropharynx is clear.  Eyes:     General: No visual field deficit or scleral icterus.    Conjunctiva/sclera: Conjunctivae normal.     Pupils: Pupils are equal, round, and reactive to light.  Cardiovascular:     Rate and Rhythm: Normal rate and regular rhythm.     Pulses: Normal pulses.     Heart sounds: No murmur heard.  No friction rub. No gallop.   Pulmonary:     Effort: Pulmonary effort is normal. No respiratory distress.     Breath sounds: No wheezing, rhonchi or rales.  Abdominal:     General: There is no distension.     Palpations: Abdomen is soft.     Tenderness: There is no abdominal tenderness. There is no right CVA tenderness, left CVA tenderness or guarding.  Musculoskeletal:        General: Tenderness and signs of injury present. No swelling.     Right lower leg: Edema present.     Comments: Spine was visualized, no gross otherwise noted, no spinal tenderness upon palpation no step-off noted.  Left shoulder was visualized swollen, tender to palpation along her deltoid.  She had decreased range of motion unable to flex, extend, adductor Abduct, decreased strength due to pain.  Patient had full range of motion 505 strength at her elbow and wrist neurovascular fully intact.  Right ankle was visualized, edematous, tender to palpation on her lateral malleolus, she had  full range of motion, 3 out of 5 strength at the ankle due to pain.  Neurovascular fully intact.  Skin:    General: Skin is warm and dry.     Capillary Refill: Capillary refill takes less than 2 seconds.     Findings: No rash.  Neurological:     General: No focal deficit present.     Mental Status: She is alert and oriented to person, place, and time.     GCS: GCS eye subscore is 4. GCS verbal subscore is 5. GCS motor subscore is 6.     Cranial Nerves: Cranial nerves are intact. No cranial nerve deficit or facial asymmetry.     Sensory: Sensation is intact. No sensory deficit.     Motor: Motor function is intact. No weakness or pronator drift.     Coordination: Coordination is intact. Romberg sign negative. Finger-Nose-Finger Test and Heel to [facility] Test normal.  Psychiatric:        Mood and Affect: Mood normal.     ED Results / Procedures / Treatments   Labs (all labs ordered are listed, but only abnormal results are displayed) Labs Reviewed  SARS CORONAVIRUS 2 BY RT PCR (HOSPITAL ORDER, [facility])    EKG None  Radiology DG Tibia/Fibula Right  Result Date: 06/18/2020 CLINICAL DATA:  Right ankle pain after fall. EXAM: RIGHT TIBIA AND FIBULA - 2 VIEW COMPARISON:  December 07, 2018. FINDINGS: Mildly displaced fracture is seen involving the lateral aspect of the distal tibia with intra-articular extension. Soft tissues are unremarkable. No other bony abnormality is noted. IMPRESSION: Mildly displaced distal tibial fracture with intra-articular extension. Electronically Signed   By: Marijo Conception M.D.   On: 06/18/2020 09:17   CT Ankle Right Wo Contrast  Result Date: 06/18/2020 CLINICAL DATA:  Right ankle pain after falling today. EXAM:  CT OF THE RIGHT ANKLE WITHOUT CONTRAST TECHNIQUE: Multidetector CT imaging of the right ankle was performed according to the standard protocol. Multiplanar CT image reconstructions were also generated. COMPARISON:  Radiographs  06/18/2020 and 12/07/2018. FINDINGS: Bones/Joint/Cartilage There is a nondisplaced intra-articular fracture of the distal tibia anterolaterally at the level of the insertion of the anterior inferior tibiofibular ligament. The adjacent anterior aspect of the distal fibula is also fractured. There is no widening of the ankle mortise. There is no displacement of the articular surface of the tibial plafond. The talar dome and medial malleolus are intact. There is a minimally displaced intra-articular fracture involving medial cuneiform adjacent to the articulation with the 1st metatarsal base, best seen on image 21/7. No other fractures are seen within the midfoot or hindfoot. There are degenerative changes at the posterior facet of the subtalar joint. Ligaments Suboptimally assessed by CT. Muscles and Tendons The ankle tendons appear intact. Soft tissues Soft tissue swelling around the ankle, greatest laterally. In addition, there is mild swelling in the midfoot. No foreign body or focal fluid collection. IMPRESSION: 1. Nondisplaced intra-articular fractures of the distal tibia anterolaterally and the adjacent anterior aspect of the distal fibula, likely mediated by the anterior inferior tibiofibular ligament. 2. Minimally displaced intra-articular fracture involving the medial cuneiform adjacent to the articulation with the 1st metatarsal base. 3. No other fractures identified. 4. Soft tissue swelling around the ankle and midfoot. Electronically Signed   By: Richardean Sale M.D.   On: 06/18/2020 15:26   CT Shoulder Left Wo Contrast  Result Date: 06/18/2020 CLINICAL DATA:  Shoulder pain after fall EXAM: CT OF THE UPPER LEFT EXTREMITY WITHOUT CONTRAST TECHNIQUE: Multidetector CT imaging of the upper left extremity was performed according to the standard protocol. COMPARISON:  Radiograph same day FINDINGS: Bones/Joint/Cartilage There is a comminuted impacted mildly displaced fracture seen through the greater  tuberosity. Small fracture fragments are seen superiorly. There is impaction deformity at the posterolateral the humeral head. The humeral head is still well seated within the glenoid. No AC joint widening. Ligaments Suboptimally assessed by CT. Muscles and Tendons The muscles surrounding shoulder appear to be grossly intact no focal atrophy or tear. The tendons appear to be grossly intact. Soft tissues There is subcutaneous edema seen around the lateral of the shoulder. IMPRESSION: Impacted comminuted mildly displaced fracture of the posterior greater tuberosity and humeral head. No dislocation. Electronically Signed   By: Prudencio Pair M.D.   On: 06/18/2020 15:17   DG Chest Port 1 View  Result Date: 06/18/2020 CLINICAL DATA:  Recent fall with chest pain, initial encounter EXAM: PORTABLE CHEST 1 VIEW COMPARISON:  06/06/2018 FINDINGS: Cardiac shadow is within normal limits. The lungs are clear bilaterally. No acute bony abnormality is seen. IMPRESSION: No acute abnormality noted. Electronically Signed   By: Inez Catalina M.D.   On: 06/18/2020 13:25   DG Shoulder Left  Result Date: 06/18/2020 CLINICAL DATA:  Left shoulder pain after fall. EXAM: LEFT SHOULDER - 2+ VIEW COMPARISON:  July 11, 2018. FINDINGS: Large defect is seen involving the greater tuberosity of the proximal left humerus concerning for moderately displaced fracture. Glenohumeral joint is unremarkable. Visualized ribs are unremarkable. IMPRESSION: Probable moderately displaced fracture involving greater tuberosity of proximal left humerus. CT scan is recommended for further evaluation. Electronically Signed   By: Marijo Conception M.D.   On: 06/18/2020 09:19    Procedures Procedures (including critical care time)  Medications Ordered in ED Medications  traMADol (ULTRAM) tablet 50 mg (  50 mg Oral Given 06/18/20 1605)  acetaminophen (TYLENOL) tablet 650 mg (650 mg Oral Given 06/18/20 1418)    ED Course  I have reviewed the triage vital  signs and the nursing notes.  Pertinent labs & imaging results that were available during my care of the patient were reviewed by me and considered in my medical decision making (see chart for details).  Clinical Course as of Jun 19 1647  Wed Jun 18, 2020  1555 Fall mechanical  Ortho deems appropriate for discharge  CM involved recommends PT evaluation in ER Pending PT and placement to SNF out of ER    [CG]  1556 Tx with cam walker and OP ortho   CT Ankle Right Wo Contrast [CG]  1556 Tx with shoulder sling and OP ortho   CT Shoulder Left Wo Contrast [CG]    Clinical Course User Index [CG] Kinnie Feil, PA-C   MDM Rules/Calculators/A&P                          I have personally reviewed all imaging, labs and have interpreted them.  Patient presents with left shoulder pain x3 weeks and right ankle pain x1 day.  She was alert and oriented, vital signs reassuring, no obvious signs of distress.  On exam patient had left shoulder pain upon palpation, limited range of motion, neurovascular fully intact, lung sounds were clear bilaterally, abdomen was soft nontender to palpation, right ankle was edematous, tender to palpation along her lateral medialis neurovascular fully intact.  Will order screening labs and x-ray for further evaluation.  Left shoulder x-ray reveals moderately displaced fracture involving the greater tuberosity of the proximal left humerus recommend CT for further evaluation.  Right ankle x-ray shows mildly displaced distal tibia fracture with anterior regular extension.  Due for humerus fracture will consult Ortho for further recommendation evaluation.  Spoke with Merla Riches, PA-C of Ortho who is going to come and evaluate the patient.  He requested additional imaging CT of the left shoulder and CT of the right ankle.  He feels patient can be managed outpatient he recommends a brace for patient's  right ankle and sling for Left  shoulder.  He has consulted social work  who will help arrange outpatient OP/PT and will see her next week for further evaluation.  Social work Recruitment consultant contacted me saying that the patient would like to go to rehab.  Social work requests that we order PT evaluation and await their recommendations.  I have low suspicion patient suffering from a CVA, intracranial head bleed as patient denies headache, nausea, vomiting, loss of balance, dizziness, paresthesias in upper or lower extremities, neuro exam was benign without no focal deficits noted.  Low suspicion for compartment syndrome as neurovascular was fully intact in her upper and lower extremities.  Low suspicion for cardiac abnormality as patient denies chest pain, shortness of breath, no signs of hypoperfusion or fluid overload on exam.  Low suspicion for acute abdomen requiring surgical intervention as patient denies abdominal pain, tolerating p.o. without difficulty, no acute abdomen on exam.  Due to shift change patient will be handed off to [employer] she provided HPI current work-up and likely disposition.  Patient will remain as a bed hold until PT evaluate her and provide their recommendations.  Final Clinical Impression(s) / ED Diagnoses Final diagnoses:  Fall, initial encounter  Closed fracture of head of left humerus, initial encounter  Closed fracture of right  ankle, initial encounter    Rx / DC Orders ED Discharge Orders    None       Aron Baba 06/18/20 1649    Daleen Bo, MD 06/18/20 [phone]

## 2020-06-18 NOTE — Progress Notes (Signed)
Orthopedic Tech Progress Note Patient Details:  Dawn Kim 1946/01/12 [id number]  Ortho Devices Type of Ortho Device: Short leg splint, Stirrup splint, Ace wrap Ortho Device/Splint Location: right       [name] 06/18/2020, 3:27 PM

## 2020-06-18 NOTE — Consult Note (Signed)
Dawn Kim is an 74 y.o. female.    Chief Complaint:  Right ankle pain s/p fall  HPI: 74 y/o female with a fall 3 weeks ago onto left shoulder causing injury. Pt tripped again today getting out of her bed injuring her right ankle. Pt c/o mild pain to right ankle and left shoulder currently. Denies any other injuries. Denies any syncope, chest pain or head injury. Denies any numbness or tingling distally. No previous issues with the right ankle in the past. Pt lives with her husband and normally walks without assistance.   PCP:  Binnie Rail, MD  PMH: Past Medical History:  Diagnosis Date  . Anxiety   . Cataract    Bil  . Depression   . Hypercholesteremia   . Hypertension   . Thyroid disease     PSes:  Allergies  Allergen Reactions  . Gabapentin     Caused parkinsonism  . Lunesta [Eszopiclone]     Ataxia, confusion  . Penicillins Hives    Has patient had a PCN reaction causing immediate rash, facial/tongue/throat swelling, SOB or lightheadedness with hypotension: No Has patient had a PCN reaction causing severe rash involving mucus membranes or skin necrosis: Yes  Has patient had a PCN reaction that required hospitalization: No Has patient had a PCN reaction occurring within the last 10 years: No  If all of the above answers are "NO", then may proceed with Cephalosporin use.   Read Drivers [Zaleplon]     Confusion, memory difficulties, ataxia  . Ambien [Zolpidem]     Confusion, hung over feeling  . Amoxicillin Rash    Has patient had a PCN reaction causing immediate rash, facial/tongue/throat swelling, SOB or lightheadedness with hypotension: No  Has patient had a PCN reaction causing severe rash involving mucus membranes or skin necrosis: Yes Has patient had a PCN reaction that required hospitalization No Has patient had a PCN reaction occurring within the last 10 years: No If all of the above answers are "NO", then may proceed with Cephalosporin use.      Medications: No current facility-administered medications for this encounter.   Current Outpatient Medications  Medication Sig Dispense Refill  . acetaminophen (TYLENOL) 325 MG tablet Take 650 mg by mouth every 6 (six) hours as needed for moderate pain.    [name] ALPRAZolam (XANAX) 0.5 MG tablet TAKE 1 TABLET BY MOUTH THREE TIMES DAILY AS NEEDED FOR ANXIETY . DO NOT EXCEED 3 PER 24 HOURS 90 tablet 0  . amLODipine (NORVASC) 5 MG tablet Take 1 tablet by mouth once daily 90 tablet 0  . calcium carbonate (OS-CAL - DOSED IN MG OF ELEMENTAL CALCIUM) 1250 (500 Ca) MG tablet Take 1 tablet (500 mg of elemental calcium total) by mouth daily.    . cholecalciferol (VITAMIN D) 1000 UNITS tablet Take 1,000 Units by mouth daily.    . cilostazol (PLETAL) 50 MG tablet Take 0.5 tablets (25 mg total) by mouth 2 (two) times daily. 60 tablet 1  . EUTHYROX 112 MCG tablet Take 1 tablet by mouth once daily 90 tablet 0  . famotidine (PEPCID) 40 MG tablet TAKE 1 TABLET BY MOUTH AT BEDTIME 90 tablet 0  . ferrous sulfate 325 (65 FE) MG EC tablet Take 325 mg by mouth daily with breakfast.    . fish oil-omega-3 fatty acids 1000 MG capsule Take 1 g by mouth 2 (two) times daily.     . hydrochlorothiazide (HYDRODIURIL) 25 MG tablet Take 1 tablet by mouth  once daily 30 tablet 0  . levocetirizine (XYZAL) 5 MG tablet TAKE 1 TABLET BY MOUTH ONCE DAILY IN THE EVENING 90 tablet 0  . Multiple Vitamin (MULTIVITAMIN) tablet Take 1 tablet by mouth daily.    [name] omeprazole (PRILOSEC) 40 MG capsule TAKE 1 CAPSULE BY MOUTH TWICE DAILY BEFORE A MEAL 180 capsule 0  . QUEtiapine (SEROQUEL) 100 MG tablet Take 1 tablet (100 mg total) by mouth at bedtime. 30 tablet 0  . ramipril (ALTACE) 10 MG capsule Take 1 capsule by mouth twice daily (Patient taking differently: daily. Take 1 capsule by mouth daily) 180 capsule 0  . simvastatin (ZOCOR) 40 MG tablet Take 1 tablet by mouth in the evening 90 tablet 0  . venlafaxine XR (EFFEXOR-XR) 150 MG 24 hr  capsule Take 1 capsule (150 mg total) by mouth daily with breakfast. 30 capsule 5    No results found for this or any previous visit (from the past 48 hour(s)). DG Tibia/Fibula Right  Result Date: 06/18/2020 CLINICAL DATA:  Right ankle pain after fall. EXAM: RIGHT TIBIA AND FIBULA - 2 VIEW COMPARISON:  December 07, 2018. FINDINGS: Mildly displaced fracture is seen involving the lateral aspect of the distal tibia with intra-articular extension. Soft tissues are unremarkable. No other bony abnormality is noted. IMPRESSION: Mildly displaced distal tibial fracture with intra-articular extension. Electronically Signed   By: Marijo Conception M.D.   On: 06/18/2020 09:17   DG Chest Port 1 View  Result Date: 06/18/2020 CLINICAL DATA:  Recent fall with chest pain, initial encounter EXAM: PORTABLE CHEST 1 VIEW COMPARISON:  06/06/2018 FINDINGS: Cardiac shadow is within normal limits. The lungs are clear bilaterally. No acute bony abnormality is seen. IMPRESSION: No acute abnormality noted. Electronically Signed   By: Inez Catalina M.D.   On: 06/18/2020 13:25   DG Shoulder Left  Result Date: 06/18/2020 CLINICAL DATA:  Left shoulder pain after fall. EXAM: LEFT SHOULDER - 2+ VIEW COMPARISON:  July 11, 2018. FINDINGS: Large defect is seen involving the greater tuberosity of the proximal left humerus concerning for moderately displaced fracture. Glenohumeral joint is unremarkable. Visualized ribs are unremarkable. IMPRESSION: Probable moderately displaced fracture involving greater tuberosity of proximal left humerus. CT scan is recommended for further evaluation. Electronically Signed   By: Marijo Conception M.D.   On: 06/18/2020 09:19    ROS: ROS  Recent left proximal humerus fracture   Physical Exam: Alert and appropriate 74 y/o female in no acute distress Left shoulder: one piece rom with mild pain nv intact distally No rashes or signs of trauma Right ankle: minimal edema No deformity nv intact distally No  rashes or signs of open injury  Able to plantar and dorsiflex Physical Exam   Assessment/Plan Assessment: 1. Right distal tibia fracture 2. Left proximal humerus fracture 3 weeks ago  Plan: Reviewed x-rays with Dr. Veverly Fells and recommend non-operative treatment for both the left shoulder and right ankle.  Discussed with the patient no need for operative management and recommend outpatient follow up Plan for posterior and U splint for the right ankle along with a walker Left shoulder/arm is stable currently Pain medication as needed and recommend ultram to start F/u next week in our office.   Merla Riches PA-C, MPAS [facility] is now [facility] [address], [place], [place], [place] [address] Phone: [phone] [email or web address] Facebook  [employer]

## 2020-06-18 NOTE — NC FL2 (Signed)
[place] LEVEL OF CARE SCREENING TOOL     IDENTIFICATION  Patient Name: Dawn Kim Birthdate: 1946/02/16 Sex: female Admission Date (Current Location): 06/18/2020  [facility] and [place] Number:  Herbalist and Address:  [facility],  [address] [place] [place], [place]      Provider Number: [id number]  Attending Physician Name and Address:  Charlesetta Shanks, MD  Relative Name and Phone Number:  Beverly Milch daughter #[phone]    Current Level of Care: Hospital Recommended Level of Care: [place] Prior Approval Number:    Date Approved/Denied:   PASRR Number: [id number]  Discharge Plan: SNF    Current Diagnoses: Patient Active Problem List   Diagnosis Date Noted   Anemia 06/18/2020   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   Confusion 06/06/2018   T12 compression fracture ([place]) 03/28/2018   Midline low back pain without sciatica 03/21/2018   Shortness of breath 01/06/2018   Chronic lower back pain 09/19/2017   Pain due to total left knee replacement ([place]) 12/09/2016   Osteopenia 06/21/2016   Hypothyroidism 10/22/2015   Depression 10/22/2015   Anxiety 10/22/2015   GERD (gastroesophageal reflux disease) 10/22/2015   Insomnia 10/22/2015   Bilateral foot pain 10/22/2015   Bilateral knee pain 10/22/2015   HTN (hypertension) 01/20/2012   Hypercholesterolemia 01/20/2012    Orientation RESPIRATION BLADDER Height & Weight     Self, Time, Situation, Place  Normal Continent Weight: 84.4 kg Height:  5\' 5"  (165.1 cm)  BEHAVIORAL SYMPTOMS/MOOD NEUROLOGICAL BOWEL NUTRITION STATUS      Continent Diet (Regular)  AMBULATORY STATUS COMMUNICATION OF NEEDS Skin   Extensive Assist Verbally Normal                       Personal Care Assistance Level of Assistance  Bathing, Feeding, Dressing Bathing Assistance: Limited assistance Feeding assistance: Independent Dressing Assistance: Limited  assistance     Functional Limitations Info  Sight, Speech, Hearing Sight Info: Adequate Hearing Info: Adequate Speech Info: Adequate    SPECIAL CARE FACTORS FREQUENCY  PT (By licensed PT), OT (By licensed OT)     PT Frequency: 5x per week OT Frequency: 5x per week            Contractures Contractures Info: Not present    Additional Factors Info  Code Status, Allergies Code Status Info: Full Code Allergies Info: Gabapentin, Lunesta, Penicillin, Sonata, Ambien, Amoxicillin           Current Medications (06/18/2020):  This is the current hospital active medication list Current Facility-Administered Medications  Medication Dose Route Frequency Provider Last Rate Last Admin   traMADol (ULTRAM) tablet 50 mg  50 mg Oral Q6H Daleen Bo, MD   50 mg at 06/18/20 1605   Current Outpatient Medications  Medication Sig Dispense Refill   acetaminophen (TYLENOL) 325 MG tablet Take 650 mg by mouth every 6 (six) hours as needed for moderate pain.     ALPRAZolam (XANAX) 0.5 MG tablet TAKE 1 TABLET BY MOUTH THREE TIMES DAILY AS NEEDED FOR ANXIETY . DO NOT EXCEED 3 PER 24 HOURS 90 tablet 0   amLODipine (NORVASC) 5 MG tablet Take 1 tablet by mouth once daily 90 tablet 0   calcium carbonate (OS-CAL - DOSED IN MG OF ELEMENTAL CALCIUM) 1250 (500 Ca) MG tablet Take 1 tablet (500 mg of elemental calcium total) by mouth daily.     cholecalciferol (VITAMIN D) 1000 UNITS tablet Take 1,000 Units  by mouth daily.     cilostazol (PLETAL) 50 MG tablet Take 0.5 tablets (25 mg total) by mouth 2 (two) times daily. 60 tablet 1   EUTHYROX 112 MCG tablet Take 1 tablet by mouth once daily 90 tablet 0   famotidine (PEPCID) 40 MG tablet TAKE 1 TABLET BY MOUTH AT BEDTIME 90 tablet 0   ferrous sulfate 325 (65 FE) MG EC tablet Take 325 mg by mouth daily with breakfast.     fish oil-omega-3 fatty acids 1000 MG capsule Take 1 g by mouth 2 (two) times daily.      hydrochlorothiazide (HYDRODIURIL) 25 MG  tablet Take 1 tablet by mouth once daily 30 tablet 0   levocetirizine (XYZAL) 5 MG tablet TAKE 1 TABLET BY MOUTH ONCE DAILY IN THE EVENING 90 tablet 0   Multiple Vitamin (MULTIVITAMIN) tablet Take 1 tablet by mouth daily.     omeprazole (PRILOSEC) 40 MG capsule TAKE 1 CAPSULE BY MOUTH TWICE DAILY BEFORE A MEAL 180 capsule 0   QUEtiapine (SEROQUEL) 100 MG tablet Take 1 tablet (100 mg total) by mouth at bedtime. 30 tablet 0   ramipril (ALTACE) 10 MG capsule Take 1 capsule by mouth twice daily (Patient taking differently: daily. Take 1 capsule by mouth daily) 180 capsule 0   simvastatin (ZOCOR) 40 MG tablet Take 1 tablet by mouth in the evening 90 tablet 0   venlafaxine XR (EFFEXOR-XR) 150 MG 24 hr capsule Take 1 capsule (150 mg total) by mouth daily with breakfast. 30 capsule 5     Discharge Medications: Please see discharge summary for a list of discharge medications.  Relevant Imaging Results:  Relevant Lab Results:   Additional Information SS # [id number]  Erenest Rasher, RN

## 2020-06-18 NOTE — TOC Initial Note (Addendum)
Transition of Care [facility]) - Initial/Assessment Note    Patient Details  Name: Dawn Kim MRN: [id number] Date of Birth: Dec 15, 1945  Transition of [facility] CM/SW Contact:    Erenest Rasher, RN Phone Number: [phone]  06/18/2020, 3:36 PM  Clinical Narrative:                  TOC CM spoke to pt and states she lives alone. She wants to go to [facility] SNF. Gave permission to create FL2 and fax to SNF rehab. She does not have any DME in the home. Pt states she is drives to her appts.  ED provider updated and requested PT eval for SNF.   TOC CM contacted PT for evaluation for possible SNF.    Expected Discharge Plan: Skilled Nursing Facility Barriers to Discharge: Continued Medical Work up   Patient Goals and CMS Choice Patient states their goals for this hospitalization and ongoing recovery are:: wants to go to rehab, lives alone CMS Medicare.gov Compare Post Acute Care list provided to:: Patient Choice offered to / list presented to : Patient  Expected Discharge Plan and Services Expected Discharge Plan: [place] In-house Referral: Clinical Social Work Discharge Planning Services: CM Consult Post Acute Care Choice: [place] arrangements for the past 2 months: [place]                                      Prior Living Arrangements/Services Living arrangements for the past 2 months: Single Family Home Lives with:: Self Patient language and need for interpreter reviewed:: Yes Do you feel safe going back to the place where you live?: No   live alone  Need for Family Participation in Patient Care: Yes (Comment) Care giver support system in place?: Yes (comment)   Criminal Activity/Legal Involvement Pertinent to Current Situation/Hospitalization: No - Comment as needed  Activities of Daily Living      Permission Sought/Granted Permission sought to share information with : Case Manager, Facility  Sport and exercise psychologist, PCP, Family Supports Permission granted to share information with : Yes, Verbal Permission Granted  Share Information with NAME: Amber Flinchum  Permission granted to share info w AGENCY: SN F  Permission granted to share info w Relationship: daughter  Permission granted to share info w Contact Information: [phone]  Emotional Assessment   Attitude/Demeanor/Rapport: Gracious Affect (typically observed): Accepting Orientation: : Oriented to Self, Oriented to Place, Oriented to  Time, Oriented to Situation   Psych Involvement: No (comment)  Admission diagnosis:  Fall; Ankle Pain Patient Active Problem List   Diagnosis Date Noted  . Anemia 06/18/2020  . 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 Confusion 06/06/2018  . T12 compression fracture ([place]) 03/28/2018  . Midline low back pain without sciatica 03/21/2018  . Shortness of breath 01/06/2018  . Chronic lower back pain 09/19/2017  . 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 Osteopenia 06/21/2016  . Hypothyroidism 10/22/2015  . Depression 10/22/2015  . Anxiety 10/22/2015  . GERD (gastroesophageal reflux disease) 10/22/2015  . Insomnia 10/22/2015  . Bilateral foot pain 10/22/2015  . Bilateral knee pain 10/22/2015  . HTN (hypertension) 01/20/2012  . Hypercholesterolemia 01/20/2012   PCP:  Binnie Rail, MD Pharmacy:   [facility] [place], [place] [address] Phone: [phone] Fax:  [place], [place] [address] [place] [address] Phone: [phone] Fax: [phone]     Social Determinants of Health (SDOH) Interventions    Readmission Risk Interventions No flowsheet data found.

## 2020-06-18 NOTE — ED Notes (Signed)
Patient transported to CT 

## 2020-06-18 NOTE — Patient Instructions (Signed)
  Blood work was ordered.     Medications reviewed and updated.  Changes include :     Your prescription(s) have been submitted to your pharmacy. Please take as directed and contact our office if you believe you are having problem(s) with the medication(s).  A referral was ordered for        Someone from their office will call you to schedule an appointment.    Please followup in 6 months   

## 2020-06-18 NOTE — ED Provider Notes (Signed)
Waiting of SNF placement, boarding, after multiple mechanical falls. Humerus fracture left and right ankle/tibia fx. Needs PT evaluation, CM has been involved. Discharge will be decided with PT eval, anticipate placement.   6:30 - patient had an uneventful night, sleeping on re-evaluations. VSS.   Patient care signed out to oncoming provider team to follow up on PT assessment and CM involvement in SNF placement.   Charlann Lange, PA-C 06/19/20 0634    Davonna Belling, MD 06/26/20 [phone]

## 2020-06-18 NOTE — ED Notes (Signed)
Ortho tech at bedside applying splint. 

## 2020-06-18 NOTE — ED Provider Notes (Addendum)
Patient handed off to me by previous ED PA at shift change.  Please see previous note for full details.  Briefly, patient presents after a fall.  Describes it as mechanical.  Had a another fall 3 weeks ago where she injured her shoulder/arm.    Previous ED PA has spoken with orthopedics, they recommend nonoperative and outpatient management of patient's injuries as below.  Initial plan was to arrange PT/OT as outpatient with case management however patient now requesting placement to rehab.  Social work requesting PT evaluation to facilitate placement. Physical Exam  BP 135/66   Pulse 73   Temp 98 F (36.7 C) (Oral)   Resp 18   Ht '5\' 5"'  (1.651 m)   Wt 84.4 kg   SpO2 96%   BMI 30.95 kg/m   Physical Exam Constitutional:      Appearance: She is well-developed.  HENT:     Head: Normocephalic.     Nose: Nose normal.  Eyes:     General: Lids are normal.  Cardiovascular:     Rate and Rhythm: Normal rate and regular rhythm.  Pulmonary:     Effort: Pulmonary effort is normal. No respiratory distress.     Breath sounds: Normal breath sounds.  Musculoskeletal:     Cervical back: Normal range of motion.  Neurological:     Mental Status: She is alert.     ED Course/Procedures   Clinical Course as of Jun 19 1831  Wed Jun 18, 2020  1555 Fall mechanical  Ortho deems appropriate for discharge  CM involved recommends PT evaluation in ER Pending PT and placement to SNF out of ER    [CG]  1556 Tx with cam walker and OP ortho   CT Ankle Right Wo Contrast [CG]  1556 Tx with shoulder sling and OP ortho   CT Shoulder Left Wo Contrast [CG]    Clinical Course User Index [CG] Kinnie Feil, PA-C    Procedures  MDM   [phone]: I met patient.  Resting in bed, has eaten without problem.  No complaints at this time.  PT has not seen the patient today, likely will be done tomorrow.  Explained to patient will likely be in the ED until PT evaluation and facility placement.  She is in  agreement.  Diet ordered. Pharmacy to reconcile meds. She has home meds at bedside.   Patient pending PT/OT evaluation in ER, CM for rehab placement. COVID negative.   2320: Pt handed off to oncoming EDPA who will observe overnight.  Of note PT order placed by previous EDPA at 1535.  Patient pending PT eval for placement.    Kinnie Feil, PA-C 06/18/20 2320    Charlesetta Shanks, MD 06/21/20 [phone]

## 2020-06-18 NOTE — Progress Notes (Signed)
Subjective:    Patient ID: Dawn Kim, female    DOB: 31-May-1946, 74 y.o.   MRN: [id number]  HPI The patient is here for follow up of their chronic medical problems, including anxiety, depression, insomnia, GERD, htn, hypothyroidism, hyperlipidemia  She is taking all of her medications as prescribed.      Medications and allergies reviewed with patient and updated if appropriate.  Patient Active Problem List   Diagnosis Date Noted  . 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 Confusion 06/06/2018  . T12 compression fracture ([place]) 03/28/2018  . Midline low back pain without sciatica 03/21/2018  . Shortness of breath 01/06/2018  . Chronic lower back pain 09/19/2017  . 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 Osteopenia 06/21/2016  . Hypothyroidism 10/22/2015  . Depression 10/22/2015  . Anxiety 10/22/2015  . GERD (gastroesophageal reflux disease) 10/22/2015  . Insomnia 10/22/2015  . Bilateral foot pain 10/22/2015  . Bilateral knee pain 10/22/2015  . HTN (hypertension) 01/20/2012  . Hypercholesterolemia 01/20/2012    Current Outpatient Medications on File Prior to Visit  Medication Sig Dispense Refill  . acetaminophen (TYLENOL) 325 MG tablet Take 650 mg by mouth every 6 (six) hours as needed for moderate pain.    [name] ALPRAZolam (XANAX) 0.5 MG tablet TAKE 1 TABLET BY MOUTH THREE TIMES DAILY AS NEEDED FOR ANXIETY . DO NOT EXCEED 3 PER 24 HOURS 90 tablet 0  . amLODipine (NORVASC) 5 MG tablet Take 1 tablet by mouth once daily 90 tablet 0  . calcium carbonate (OS-CAL - DOSED IN MG OF ELEMENTAL CALCIUM) 1250 (500 Ca) MG tablet Take 1 tablet (500 mg of elemental calcium total) by mouth daily.    . cholecalciferol (VITAMIN D) 1000 UNITS tablet Take 1,000 Units by mouth daily.    . cilostazol (PLETAL) 50 MG tablet Take 0.5 tablets (25 mg total) by mouth 2 (two) times daily. 60 tablet 1  . EUTHYROX 112 MCG tablet Take 1 tablet by mouth once daily 90 tablet 0    . famotidine (PEPCID) 40 MG tablet TAKE 1 TABLET BY MOUTH AT BEDTIME 90 tablet 0  . ferrous sulfate 325 (65 FE) MG EC tablet Take 325 mg by mouth daily with breakfast.    . fish oil-omega-3 fatty acids 1000 MG capsule Take 1 g by mouth 2 (two) times daily.     . hydrochlorothiazide (HYDRODIURIL) 25 MG tablet Take 1 tablet by mouth once daily 30 tablet 0  . levocetirizine (XYZAL) 5 MG tablet TAKE 1 TABLET BY MOUTH ONCE DAILY IN THE EVENING 90 tablet 0  . Multiple Vitamin (MULTIVITAMIN) tablet Take 1 tablet by mouth daily.    [name] omeprazole (PRILOSEC) 40 MG capsule TAKE 1 CAPSULE BY MOUTH TWICE DAILY BEFORE A MEAL 180 capsule 0  . QUEtiapine (SEROQUEL) 100 MG tablet Take 1 tablet (100 mg total) by mouth at bedtime. 30 tablet 0  . ramipril (ALTACE) 10 MG capsule Take 1 capsule by mouth twice daily (Patient taking differently: daily. Take 1 capsule by mouth daily) 180 capsule 0  . simvastatin (ZOCOR) 40 MG tablet Take 1 tablet by mouth in the evening 90 tablet 0  . venlafaxine XR (EFFEXOR-XR) 150 MG 24 hr capsule Take 1 capsule (150 mg total) by mouth daily with breakfast. 30 capsule 5   No current facility-administered medications on file prior to visit.    Past Medical History:  Diagnosis Date  . Anxiety   . Cataract  Bil  . Depression   . Hypercholesteremia   . Hypertension   . Thyroid disease     Past Surgical History:  Procedure Laterality Date  . CHOLECYSTECTOMY    . COLONOSCOPY    . FOOT SURGERY  2011   right foot  . JOINT REPLACEMENT  2010   left knee  . POLYPECTOMY    . REPLACEMENT TOTAL KNEE Left   . TONSILLECTOMY      Social History   Socioeconomic History  . Marital status: Married    Spouse name: Not on file  . Number of children: 3  . Years of education: 76  . Highest education level: Not on file  Occupational History  . Occupation: Retired   Tobacco Use  . Smoking status: Former Smoker    Types: Cigarettes    Quit date: 11/04/2009    Years since  quitting: 10.6  . Smokeless tobacco: Never Used  Vaping Use  . Vaping Use: Never used  Substance and Sexual Activity  . Alcohol use: No  . Drug use: No  . Sexual activity: Never  Other Topics Concern  . Not on file  Social History Narrative   Cares for 67yo husband; has daughter locally who can help if needed   Social Determinants of Health   Financial Resource Strain:   . Difficulty of Paying Living Expenses: Not on file  Food Insecurity:   . Worried About Charity fundraiser in the Last Year: Not on file  . Ran Out of Food in the Last Year: Not on file  Transportation Needs:   . Lack of Transportation (Medical): Not on file  . Lack of Transportation (Non-Medical): Not on file  Physical Activity:   . Days of Exercise per Week: Not on file  . Minutes of Exercise per Session: Not on file  Stress:   . Feeling of Stress : Not on file  Social Connections:   . Frequency of Communication with Friends and Family: Not on file  . Frequency of Social Gatherings with Friends and Family: Not on file  . Attends Religious Services: Not on file  . Active Member of Clubs or Organizations: Not on file  . Attends Archivist Meetings: Not on file  . Marital Status: Not on file    Family History  Problem Relation Age of Onset  . Heart disease Mother   . Drug abuse Sister     Review of Systems     Objective:  There were no vitals filed for this visit. BP Readings from Last 3 Encounters:  01/16/20 134/80  12/12/19 (!) 164/92  11/20/19 (!) 170/90   Wt Readings from Last 3 Encounters:  01/16/20 196 lb (88.9 kg)  12/12/19 201 lb (91.2 kg)  11/20/19 206 lb (93.4 kg)   There is no height or weight on file to calculate BMI.   Physical Exam    Constitutional: Appears well-developed and well-nourished. No distress.  HENT:  Head: Normocephalic and atraumatic.  Neck: Neck supple. No tracheal deviation present. No thyromegaly present.  No cervical  lymphadenopathy Cardiovascular: Normal rate, regular rhythm and normal heart sounds.   No murmur heard. No carotid bruit .  No edema Pulmonary/Chest: Effort normal and breath sounds normal. No respiratory distress. No has no wheezes. No rales.  Skin: Skin is warm and dry. Not diaphoretic.  Psychiatric: Normal mood and affect. Behavior is normal.      Assessment & Plan:    See Problem List for Assessment and  Plan of chronic medical problems.    This visit occurred during the SARS-CoV-2 public health emergency.  Safety protocols were in place, including screening questions prior to the visit, additional usage of staff PPE, and extensive cleaning of exam room while observing appropriate contact time as indicated for disinfecting solutions.    This encounter was created in error - please disregard.

## 2020-06-19 DIAGNOSIS — R41841 Cognitive communication deficit: Secondary | ICD-10-CM | POA: Diagnosis not present

## 2020-06-19 DIAGNOSIS — S8291XA Unspecified fracture of right lower leg, initial encounter for closed fracture: Secondary | ICD-10-CM | POA: Diagnosis not present

## 2020-06-19 DIAGNOSIS — F1320 Sedative, hypnotic or anxiolytic dependence, uncomplicated: Secondary | ICD-10-CM | POA: Diagnosis not present

## 2020-06-19 DIAGNOSIS — F411 Generalized anxiety disorder: Secondary | ICD-10-CM | POA: Diagnosis not present

## 2020-06-19 DIAGNOSIS — W19XXXA Unspecified fall, initial encounter: Secondary | ICD-10-CM | POA: Diagnosis not present

## 2020-06-19 DIAGNOSIS — S42202S Unspecified fracture of upper end of left humerus, sequela: Secondary | ICD-10-CM | POA: Diagnosis not present

## 2020-06-19 DIAGNOSIS — Z7401 Bed confinement status: Secondary | ICD-10-CM | POA: Diagnosis not present

## 2020-06-19 DIAGNOSIS — R5381 Other malaise: Secondary | ICD-10-CM | POA: Diagnosis not present

## 2020-06-19 DIAGNOSIS — R5383 Other fatigue: Secondary | ICD-10-CM | POA: Diagnosis not present

## 2020-06-19 DIAGNOSIS — E876 Hypokalemia: Secondary | ICD-10-CM | POA: Diagnosis not present

## 2020-06-19 DIAGNOSIS — H1089 Other conjunctivitis: Secondary | ICD-10-CM | POA: Diagnosis not present

## 2020-06-19 DIAGNOSIS — M6281 Muscle weakness (generalized): Secondary | ICD-10-CM | POA: Diagnosis not present

## 2020-06-19 DIAGNOSIS — M25571 Pain in right ankle and joints of right foot: Secondary | ICD-10-CM | POA: Diagnosis not present

## 2020-06-19 DIAGNOSIS — Z96652 Presence of left artificial knee joint: Secondary | ICD-10-CM | POA: Diagnosis not present

## 2020-06-19 DIAGNOSIS — S82201D Unspecified fracture of shaft of right tibia, subsequent encounter for closed fracture with routine healing: Secondary | ICD-10-CM | POA: Diagnosis not present

## 2020-06-19 DIAGNOSIS — M25512 Pain in left shoulder: Secondary | ICD-10-CM | POA: Diagnosis not present

## 2020-06-19 DIAGNOSIS — F331 Major depressive disorder, recurrent, moderate: Secondary | ICD-10-CM | POA: Diagnosis not present

## 2020-06-19 DIAGNOSIS — E039 Hypothyroidism, unspecified: Secondary | ICD-10-CM | POA: Diagnosis not present

## 2020-06-19 DIAGNOSIS — F39 Unspecified mood [affective] disorder: Secondary | ICD-10-CM | POA: Diagnosis not present

## 2020-06-19 DIAGNOSIS — I10 Essential (primary) hypertension: Secondary | ICD-10-CM | POA: Diagnosis not present

## 2020-06-19 DIAGNOSIS — Z87891 Personal history of nicotine dependence: Secondary | ICD-10-CM | POA: Diagnosis not present

## 2020-06-19 DIAGNOSIS — Z79899 Other long term (current) drug therapy: Secondary | ICD-10-CM | POA: Diagnosis not present

## 2020-06-19 DIAGNOSIS — R2681 Unsteadiness on feet: Secondary | ICD-10-CM | POA: Diagnosis not present

## 2020-06-19 DIAGNOSIS — M8580 Other specified disorders of bone density and structure, unspecified site: Secondary | ICD-10-CM | POA: Diagnosis not present

## 2020-06-19 DIAGNOSIS — Z711 Person with feared health complaint in whom no diagnosis is made: Secondary | ICD-10-CM | POA: Diagnosis not present

## 2020-06-19 DIAGNOSIS — D649 Anemia, unspecified: Secondary | ICD-10-CM | POA: Diagnosis not present

## 2020-06-19 DIAGNOSIS — S42202A Unspecified fracture of upper end of left humerus, initial encounter for closed fracture: Secondary | ICD-10-CM | POA: Diagnosis not present

## 2020-06-19 DIAGNOSIS — S42302D Unspecified fracture of shaft of humerus, left arm, subsequent encounter for fracture with routine healing: Secondary | ICD-10-CM | POA: Diagnosis not present

## 2020-06-19 DIAGNOSIS — M2550 Pain in unspecified joint: Secondary | ICD-10-CM | POA: Diagnosis not present

## 2020-06-19 DIAGNOSIS — E038 Other specified hypothyroidism: Secondary | ICD-10-CM | POA: Diagnosis not present

## 2020-06-19 DIAGNOSIS — S82891D Other fracture of right lower leg, subsequent encounter for closed fracture with routine healing: Secondary | ICD-10-CM | POA: Diagnosis not present

## 2020-06-19 DIAGNOSIS — Z20822 Contact with and (suspected) exposure to covid-19: Secondary | ICD-10-CM | POA: Diagnosis not present

## 2020-06-19 DIAGNOSIS — R262 Difficulty in walking, not elsewhere classified: Secondary | ICD-10-CM | POA: Diagnosis not present

## 2020-06-19 NOTE — ED Notes (Signed)
PTAR called  

## 2020-06-19 NOTE — ED Notes (Signed)
Pt assisted to BR via steady and 2 person assist. Gait belt placed on patient to assist.

## 2020-06-19 NOTE — ED Notes (Signed)
Called pharmacy to have someone come to review patient's medication list.

## 2020-06-19 NOTE — Progress Notes (Addendum)
TOC CM received call from [facility] and pt was approved for SNF. Pt has chosen [facility]. [facility] did not have bed available. Auth approved for 06/19/2020 for 5 days. The next review date is on 06/23/2020. [facility], will be [facility]. [place], [place] ED TOC CM [phone]

## 2020-06-19 NOTE — ED Provider Notes (Signed)
Patient evaluated by PT this morning.  Patient placed in left shoulder immobilizer.  Patient has been approved for a SNF facility and has chosen [facility].  She is lying in bed.  Notes some mild suprapubic pain since having her catheter placed.  Patient seems distraught about having to go to a SNF facility.  We discussed its necessity in length and she now seems more understanding.  Her vital signs are stable.      Rayna Sexton, PA-C 06/19/20 1406    Fredia Sorrow, MD 07/11/20 [phone]

## 2020-06-19 NOTE — ED Notes (Signed)
Pt assisted to BR via steady

## 2020-06-19 NOTE — Progress Notes (Signed)
Orthopedic Tech Progress Note Patient Details:  Dawn Kim 11/25/1945 [id number]  Ortho Devices Type of Ortho Device: Shoulder immobilizer Ortho Device/Splint Location: left Ortho Device/Splint Interventions: Application   Post Interventions Patient Tolerated: Well Instructions Provided: Care of device   [name] 06/19/2020, 1:02 PM

## 2020-06-19 NOTE — Progress Notes (Signed)
CSW received a call from pt's RN stating pt's niece Melody at ph: [phone] called and wanted info on:  Who chose [facility] and how it was chosen and asked for a call back.  Per the RN Corey Harold has been called to transport to [facility].  CSW will continue to follow for D/C needs.  Alphonse Guild. Amariyana Heacox  MSW, LCSW, LCAS, CCS Transitions of Care Clinical Social Worker Care Coordination Department Ph: [phone]

## 2020-06-19 NOTE — Progress Notes (Signed)
TOC CSW presented pt with her bed acceptances via Medicare.gov packet.  Pt has accepted [facility] offer.  Dc arrangements will be made with [facility].  CSW will continue to follow for dc needs.  Deslyn Cavenaugh Tarpley-Carter, MSW, LCSW-A                  [facility] ED Transitions of CareClinical Social Worker [email or web address] [phone]

## 2020-06-19 NOTE — Progress Notes (Signed)
TOC CSW has faxed AVS to [facility] and pt is ready to be transported.   Room #:  103P  Call Report #:  [phone]  Nurse has also been notified of pts dc plan.  CSW will continue to follow for dc needs.  Anitra Doxtater Tarpley-Carter, MSW, LCSW-A Pronouns:  She, Her, Hers                  [place] ED Transitions of CareClinical Social Worker [email or web address] [phone]

## 2020-06-19 NOTE — ED Notes (Signed)
Pharmacist came to review patient medication list. Per Pharmacist medication list completed.

## 2020-06-19 NOTE — Evaluation (Addendum)
Physical Therapy Evaluation Patient Details Name: Dawn Kim MRN: [id number] DOB: 1946-03-09 Today's Date: 06/19/2020   History of Present Illness  74 y/o female with a fall 3 weeks ago onto left shoulder causing injury and another fall recently with R ankle injury. Imaging showed L proximal humerus fx and R distal tibia fx, both non operative. NWB RLE.  Clinical Impression  Pt admitted with above diagnosis. Min assist for supine to sit and for sit to stand with pt pulling up on a stedy. Pt has significantly limited mobility 2* non weight bearing status RLE and is at high risk for falls. She is hoping to DC home, however this does not sound like a safe scenario as she stated her husband is very [employer] and was unable to hear her calling for help after recent falls. ST-SNF recommended. Will ask ortho if pt can use a L platform walker, which would allow for more mobility.  Pt currently with functional limitations due to the deficits listed below (see PT Problem List). Pt will benefit from skilled PT to increase their independence and safety with mobility to allow discharge to the venue listed below.       Follow Up Recommendations SNF;Supervision/Assistance - 24 hour;Supervision for mobility/OOB    Equipment Recommendations  Wheelchair cushion (measurements PT);Wheelchair (measurements PT)    Recommendations for Other Services   OT (order placed)    Precautions / Restrictions Precautions Precautions: Fall Precaution Comments: multiple falls recently, pt stated this was due to a medication than has now been DCed Required Braces or Orthoses: Splint/Cast Splint/Cast: splint R ankle Restrictions Weight Bearing Restrictions: Yes RLE Weight Bearing: Non weight bearing   Addendum: Brad, ortho PA, stated pt may WB thru LUE, ok to use a RW.      Mobility  Bed Mobility Overal bed mobility: Needs Assistance Bed Mobility: Supine to Sit     Supine to sit: Min assist     General bed  mobility comments: assist to pivot hips to edge of bed, support RLE and raise trunk, used R rail to pull up  Transfers Overall transfer level: Needs assistance Equipment used: Ambulation equipment used Transfers: Sit to/from Stand Sit to Stand: Min assist         General transfer comment: pulled up with RUE, good adherence to NWB status LLE, min A to power up to stand with Stedy; will need to ask ortho if pt can use a platform walker  Ambulation/Gait             General Gait Details: unable  Stairs            Wheelchair Mobility    Modified Rankin (Stroke Patients Only)       Balance Overall balance assessment: Needs assistance;History of Falls   Sitting balance-Leahy Scale: Fair     Standing balance support: Single extremity supported Standing balance-Leahy Scale: Poor Standing balance comment: relies on single UE support                             Pertinent Vitals/Pain Pain Assessment: Faces Faces Pain Scale: Hurts little more Pain Location: L shoulder with movement Pain Descriptors / Indicators: Guarding;Grimacing Pain Intervention(s): Limited activity within patient's tolerance;Monitored during session;Repositioned    Home Living Family/patient expects to be discharged to:: Private residence Living Arrangements: Spouse/significant other Available Help at Discharge: Family;Available 24 hours/day Type of Home: House Home Access: Level entry     Home Layout:  One level        Prior Function Level of Independence: Independent         Comments: walked without AD PTA but admits to multiple falls recently, pt stated this was due to a medication which has now been DCed. She states her husband is home 24/7 but is very [employer] [facility] and didn't hear her calling for help when she fell.     Hand Dominance   Dominant Hand: Right    Extremity/Trunk Assessment   Upper Extremity Assessment Upper Extremity Assessment: LUE deficits/detail LUE  Deficits / Details: NT 2* fx    Lower Extremity Assessment Lower Extremity Assessment: RLE deficits/detail RLE Deficits / Details: toes edematous, able to wiggle toes, R knee ext at least 3/5, splint on lower leg and ankle    Cervical / Trunk Assessment Cervical / Trunk Assessment: Kyphotic  Communication   Communication: No difficulties  Cognition Arousal/Alertness: Lethargic (pt slept poorly last night, was in hallway) Behavior During Therapy: WFL for tasks assessed/performed Overall Cognitive Status: Within Functional Limits for tasks assessed                                        General Comments      Exercises     Assessment/Plan    PT Assessment Patient needs continued PT services  PT Problem List Decreased mobility;Decreased balance;Pain       PT Treatment Interventions Therapeutic activities;Therapeutic exercise;Functional mobility training;Patient/family education    PT Goals (Current goals can be found in the Care Plan section)  Acute Rehab PT Goals Patient Stated Goal: wants to go home PT Goal Formulation: With patient Time For Goal Achievement: 07/03/20 Potential to Achieve Goals: Fair    Frequency Min 3X/week   Barriers to discharge        Co-evaluation               AM-PAC PT "6 Clicks" Mobility  Outcome Measure Help needed turning from your back to your side while in a flat bed without using bedrails?: A Little Help needed moving from lying on your back to sitting on the side of a flat bed without using bedrails?: A Little Help needed moving to and from a bed to a chair (including a wheelchair)?: A Little Help needed standing up from a chair using your arms (e.g., wheelchair or bedside chair)?: A Lot Help needed to walk in hospital room?: Total Help needed climbing 3-5 steps with a railing? : Total 6 Click Score: 13    End of Session Equipment Utilized During Treatment: Gait belt Activity Tolerance: Patient tolerated  treatment well;Patient limited by fatigue Patient left: in bed;with call bell/phone within reach;with bed alarm set Nurse Communication: Mobility status PT Visit Diagnosis: Difficulty in walking, not elsewhere classified (R26.2);Pain Pain - Right/Left: Left Pain - part of body: Shoulder    Time: 5400-8676 PT Time Calculation (min) (ACUTE ONLY): 20 min   Charges:   PT Evaluation $PT Eval Low Complexity: 1 Low          Blondell Reveal Kistler PT 06/19/2020  Acute Rehabilitation Services Pager [phone] Office [phone]

## 2020-06-20 DIAGNOSIS — F1320 Sedative, hypnotic or anxiolytic dependence, uncomplicated: Secondary | ICD-10-CM | POA: Diagnosis not present

## 2020-06-20 DIAGNOSIS — M8580 Other specified disorders of bone density and structure, unspecified site: Secondary | ICD-10-CM | POA: Diagnosis not present

## 2020-06-20 DIAGNOSIS — I10 Essential (primary) hypertension: Secondary | ICD-10-CM | POA: Diagnosis not present

## 2020-06-20 DIAGNOSIS — E038 Other specified hypothyroidism: Secondary | ICD-10-CM | POA: Diagnosis not present

## 2020-06-23 DIAGNOSIS — S42202S Unspecified fracture of upper end of left humerus, sequela: Secondary | ICD-10-CM | POA: Diagnosis not present

## 2020-06-23 DIAGNOSIS — R2681 Unsteadiness on feet: Secondary | ICD-10-CM | POA: Diagnosis not present

## 2020-06-23 DIAGNOSIS — M6281 Muscle weakness (generalized): Secondary | ICD-10-CM | POA: Diagnosis not present

## 2020-06-23 DIAGNOSIS — S82201D Unspecified fracture of shaft of right tibia, subsequent encounter for closed fracture with routine healing: Secondary | ICD-10-CM | POA: Diagnosis not present

## 2020-06-25 DIAGNOSIS — S82201D Unspecified fracture of shaft of right tibia, subsequent encounter for closed fracture with routine healing: Secondary | ICD-10-CM | POA: Diagnosis not present

## 2020-06-25 DIAGNOSIS — M6281 Muscle weakness (generalized): Secondary | ICD-10-CM | POA: Diagnosis not present

## 2020-06-25 DIAGNOSIS — E876 Hypokalemia: Secondary | ICD-10-CM | POA: Diagnosis not present

## 2020-06-25 DIAGNOSIS — H1089 Other conjunctivitis: Secondary | ICD-10-CM | POA: Diagnosis not present

## 2020-06-25 DIAGNOSIS — R5383 Other fatigue: Secondary | ICD-10-CM | POA: Diagnosis not present

## 2020-06-25 DIAGNOSIS — R2681 Unsteadiness on feet: Secondary | ICD-10-CM | POA: Diagnosis not present

## 2020-06-25 DIAGNOSIS — S82891D Other fracture of right lower leg, subsequent encounter for closed fracture with routine healing: Secondary | ICD-10-CM | POA: Diagnosis not present

## 2020-06-25 DIAGNOSIS — S42202S Unspecified fracture of upper end of left humerus, sequela: Secondary | ICD-10-CM | POA: Diagnosis not present

## 2020-06-26 DIAGNOSIS — M25512 Pain in left shoulder: Secondary | ICD-10-CM | POA: Diagnosis not present

## 2020-06-26 DIAGNOSIS — M25571 Pain in right ankle and joints of right foot: Secondary | ICD-10-CM | POA: Diagnosis not present

## 2020-06-27 DIAGNOSIS — S82201D Unspecified fracture of shaft of right tibia, subsequent encounter for closed fracture with routine healing: Secondary | ICD-10-CM | POA: Diagnosis not present

## 2020-06-27 DIAGNOSIS — R2681 Unsteadiness on feet: Secondary | ICD-10-CM | POA: Diagnosis not present

## 2020-06-27 DIAGNOSIS — S42202S Unspecified fracture of upper end of left humerus, sequela: Secondary | ICD-10-CM | POA: Diagnosis not present

## 2020-06-27 DIAGNOSIS — M6281 Muscle weakness (generalized): Secondary | ICD-10-CM | POA: Diagnosis not present

## 2020-06-30 DIAGNOSIS — E876 Hypokalemia: Secondary | ICD-10-CM | POA: Diagnosis not present

## 2020-06-30 DIAGNOSIS — S82891D Other fracture of right lower leg, subsequent encounter for closed fracture with routine healing: Secondary | ICD-10-CM | POA: Diagnosis not present

## 2020-06-30 DIAGNOSIS — M6281 Muscle weakness (generalized): Secondary | ICD-10-CM | POA: Diagnosis not present

## 2020-06-30 DIAGNOSIS — S82201D Unspecified fracture of shaft of right tibia, subsequent encounter for closed fracture with routine healing: Secondary | ICD-10-CM | POA: Diagnosis not present

## 2020-06-30 DIAGNOSIS — S42202S Unspecified fracture of upper end of left humerus, sequela: Secondary | ICD-10-CM | POA: Diagnosis not present

## 2020-06-30 DIAGNOSIS — R2681 Unsteadiness on feet: Secondary | ICD-10-CM | POA: Diagnosis not present

## 2020-06-30 DIAGNOSIS — S42302D Unspecified fracture of shaft of humerus, left arm, subsequent encounter for fracture with routine healing: Secondary | ICD-10-CM | POA: Diagnosis not present

## 2020-07-02 DIAGNOSIS — R2681 Unsteadiness on feet: Secondary | ICD-10-CM | POA: Diagnosis not present

## 2020-07-02 DIAGNOSIS — S42202S Unspecified fracture of upper end of left humerus, sequela: Secondary | ICD-10-CM | POA: Diagnosis not present

## 2020-07-02 DIAGNOSIS — S82201D Unspecified fracture of shaft of right tibia, subsequent encounter for closed fracture with routine healing: Secondary | ICD-10-CM | POA: Diagnosis not present

## 2020-07-02 DIAGNOSIS — M6281 Muscle weakness (generalized): Secondary | ICD-10-CM | POA: Diagnosis not present

## 2020-07-03 ENCOUNTER — Telehealth: Payer: Self-pay | Admitting: Internal Medicine

## 2020-07-03 DIAGNOSIS — F1320 Sedative, hypnotic or anxiolytic dependence, uncomplicated: Secondary | ICD-10-CM | POA: Diagnosis not present

## 2020-07-03 DIAGNOSIS — I10 Essential (primary) hypertension: Secondary | ICD-10-CM | POA: Diagnosis not present

## 2020-07-03 DIAGNOSIS — E039 Hypothyroidism, unspecified: Secondary | ICD-10-CM | POA: Diagnosis not present

## 2020-07-03 DIAGNOSIS — Z711 Person with feared health complaint in whom no diagnosis is made: Secondary | ICD-10-CM | POA: Diagnosis not present

## 2020-07-03 NOTE — Telephone Encounter (Signed)
ok 

## 2020-07-03 NOTE — Telephone Encounter (Signed)
Tanya with [facility] called and is requesting verbal orders for physical therapy and occupational therapy evaluation.      Lavella Lemons: [phone]

## 2020-07-03 NOTE — Telephone Encounter (Signed)
Notified Tanya w/MD response../lmb 

## 2020-07-04 ENCOUNTER — Other Ambulatory Visit: Payer: Self-pay | Admitting: Internal Medicine

## 2020-07-04 DIAGNOSIS — S82201D Unspecified fracture of shaft of right tibia, subsequent encounter for closed fracture with routine healing: Secondary | ICD-10-CM | POA: Diagnosis not present

## 2020-07-04 DIAGNOSIS — M6281 Muscle weakness (generalized): Secondary | ICD-10-CM | POA: Diagnosis not present

## 2020-07-04 DIAGNOSIS — F39 Unspecified mood [affective] disorder: Secondary | ICD-10-CM | POA: Diagnosis not present

## 2020-07-04 DIAGNOSIS — R2681 Unsteadiness on feet: Secondary | ICD-10-CM | POA: Diagnosis not present

## 2020-07-04 DIAGNOSIS — S42202S Unspecified fracture of upper end of left humerus, sequela: Secondary | ICD-10-CM | POA: Diagnosis not present

## 2020-07-04 DIAGNOSIS — F1320 Sedative, hypnotic or anxiolytic dependence, uncomplicated: Secondary | ICD-10-CM | POA: Diagnosis not present

## 2020-07-04 DIAGNOSIS — I10 Essential (primary) hypertension: Secondary | ICD-10-CM | POA: Diagnosis not present

## 2020-07-04 DIAGNOSIS — E039 Hypothyroidism, unspecified: Secondary | ICD-10-CM | POA: Diagnosis not present

## 2020-07-04 NOTE — Telephone Encounter (Signed)
Check [place] registry last filled 09/01/202.Marland KitchenJohny Chess

## 2020-07-04 NOTE — Telephone Encounter (Signed)
ALPRAZolam Duanne Moron) 0.5 MG tablet  [place], [place] Phone:  [phone]  Fax:  [phone]     Last appt:  9.15.21

## 2020-07-05 MED ORDER — ALPRAZOLAM 0.5 MG PO TABS
ORAL_TABLET | ORAL | 0 refills | Status: DC
Start: 2020-07-05 — End: 2020-07-09

## 2020-07-07 ENCOUNTER — Other Ambulatory Visit: Payer: Self-pay | Admitting: Internal Medicine

## 2020-07-07 NOTE — Telephone Encounter (Signed)
Pt is calling back about this prescription. Let pt know message has been sent to Dr Quay Burow. Thanks.

## 2020-07-07 NOTE — Telephone Encounter (Signed)
Dawn Kim fell on 9.15.21, and fractured her ankle and collarbone on the right side...  She is seeing an ortho doctor and has an appointment this week to focus on her mobility  Is it possible to refill her medication?

## 2020-07-08 DIAGNOSIS — S82201D Unspecified fracture of shaft of right tibia, subsequent encounter for closed fracture with routine healing: Secondary | ICD-10-CM | POA: Diagnosis not present

## 2020-07-08 DIAGNOSIS — E7800 Pure hypercholesterolemia, unspecified: Secondary | ICD-10-CM | POA: Diagnosis not present

## 2020-07-08 DIAGNOSIS — S82201S Unspecified fracture of shaft of right tibia, sequela: Secondary | ICD-10-CM | POA: Diagnosis not present

## 2020-07-08 DIAGNOSIS — S42202D Unspecified fracture of upper end of left humerus, subsequent encounter for fracture with routine healing: Secondary | ICD-10-CM | POA: Diagnosis not present

## 2020-07-08 NOTE — Telephone Encounter (Signed)
Left message for patient yesterday and message left for patient that script had been sent in.

## 2020-07-09 MED ORDER — ALPRAZOLAM 0.5 MG PO TABS
ORAL_TABLET | ORAL | 0 refills | Status: DC
Start: 2020-07-09 — End: 2020-07-31

## 2020-07-09 NOTE — Addendum Note (Signed)
Addended by: Binnie Rail on: 07/09/2020 10:19 AM   Modules accepted: Orders

## 2020-07-10 DIAGNOSIS — S42253A Displaced fracture of greater tuberosity of unspecified humerus, initial encounter for closed fracture: Secondary | ICD-10-CM | POA: Insufficient documentation

## 2020-07-10 DIAGNOSIS — S82209A Unspecified fracture of shaft of unspecified tibia, initial encounter for closed fracture: Secondary | ICD-10-CM | POA: Insufficient documentation

## 2020-07-10 NOTE — Progress Notes (Signed)
Subjective:    Patient ID: Dawn Kim, female    DOB: 1946/08/17, 74 y.o.   MRN: [id number]  HPI The patient is here for follow up from the hospital.    She went to the ED 9/15 after a mechanical fall.  She also had a fall three weeks prior.  She c/o left shoulder/arm pain from fall three weeks prior and right ankle pain from the fall that day. .    Left shoulder xray showed moderately displaced fracture involving great tuberosity of the proximal left humerus.    Right ankle xray showed mildly displaced distal tibia fracture w anterior regular extension.  Ortho consulted and advised CT.  Managed conservatively and follow up as an outpatient.  PT saw her and she was discharged to rehab.  She was at [facility] place for 18 days and is now home.  She is able to do her ADLs.  She has seen ortho and has a follow up with them.  She does not need surgery.  She is taking tylenol for her pain.  She was prescribed tramadol but has not gotten the prescription.     She is having dysuria and has decreased urination.  This started a week ago.  She denies any fevers, nausea, abdominal pain or blood in the urine.    Medications and allergies reviewed with patient and updated if appropriate.  Patient Active Problem List   Diagnosis Date Noted  . Fracture, humerus, greater tuberosity 07/10/2020  . Closed tibia fracture 07/10/2020  . Anemia 06/18/2020  . 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 Confusion 06/06/2018  . T12 compression fracture ([place]) 03/28/2018  . Midline low back pain without sciatica 03/21/2018  . Shortness of breath 01/06/2018  . Chronic lower back pain 09/19/2017  . 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 Osteopenia 06/21/2016  . Hypothyroidism 10/22/2015  . Depression 10/22/2015  . Anxiety 10/22/2015  . GERD (gastroesophageal reflux disease) 10/22/2015  . Insomnia 10/22/2015  . Bilateral foot pain 10/22/2015  . Bilateral knee pain 10/22/2015  .  HTN (hypertension) 01/20/2012  . Hypercholesterolemia 01/20/2012    Current Outpatient Medications on File Prior to Visit  Medication Sig Dispense Refill  . acetaminophen (TYLENOL) 325 MG tablet Take 650 mg by mouth every 6 (six) hours as needed for mild pain or moderate pain.     [name] ALPRAZolam (XANAX) 0.5 MG tablet TAKE 1 TABLET BY MOUTH THREE TIMES DAILY AS NEEDED FOR ANXIETY . DO NOT EXCEED 3 PER 24 HOURS 90 tablet 0  . amLODipine (NORVASC) 5 MG tablet Take 1 tablet by mouth once daily (Patient taking differently: Take 5 mg by mouth daily. ) 90 tablet 0  . calcium carbonate (OS-CAL - DOSED IN MG OF ELEMENTAL CALCIUM) 1250 (500 Ca) MG tablet Take 1 tablet (500 mg of elemental calcium total) by mouth daily.    . cholecalciferol (VITAMIN D) 1000 UNITS tablet Take 1,000 Units by mouth daily.    . cilostazol (PLETAL) 50 MG tablet Take 0.5 tablets (25 mg total) by mouth 2 (two) times daily. 60 tablet 1  . EUTHYROX 112 MCG tablet Take 1 tablet by mouth once daily (Patient taking differently: Take 112 mcg by mouth daily. ) 90 tablet 0  . famotidine (PEPCID) 40 MG tablet TAKE 1 TABLET BY MOUTH AT BEDTIME (Patient taking differently: Take 40 mg by mouth daily. ) 90 tablet 0  . ferrous sulfate 325 (65 FE) MG EC tablet Take  325 mg by mouth daily with breakfast.    . fish oil-omega-3 fatty acids 1000 MG capsule Take 1 g by mouth 2 (two) times daily.     . hydrochlorothiazide (HYDRODIURIL) 25 MG tablet Take 1 tablet by mouth once daily (Patient taking differently: Take 25 mg by mouth daily. ) 30 tablet 0  . levocetirizine (XYZAL) 5 MG tablet TAKE 1 TABLET BY MOUTH ONCE DAILY IN THE EVENING 90 tablet 0  . Multiple Vitamin (MULTIVITAMIN) tablet Take 1 tablet by mouth daily.    [name] omeprazole (PRILOSEC) 40 MG capsule TAKE 1 CAPSULE BY MOUTH TWICE DAILY BEFORE A MEAL (Patient taking differently: Take 40 mg by mouth in the morning and at bedtime. ) 180 capsule 0  . QUEtiapine (SEROQUEL) 100 MG tablet TAKE 1 TABLET  BY MOUTH AT BEDTIME 30 tablet 0  . ramipril (ALTACE) 10 MG capsule Take 1 capsule by mouth twice daily (Patient taking differently: Take 10 mg by mouth daily. ) 180 capsule 0  . simvastatin (ZOCOR) 40 MG tablet Take 1 tablet by mouth in the evening 90 tablet 0  . traMADol (ULTRAM) 50 MG tablet Ultram 50 mg tablet  Take 1 tablet every 6 hours by oral route.    . venlafaxine XR (EFFEXOR-XR) 150 MG 24 hr capsule Take 1 capsule (150 mg total) by mouth daily with breakfast. 30 capsule 5   No current facility-administered medications on file prior to visit.    Past Medical History:  Diagnosis Date  . Anxiety   . Cataract    Bil  . Depression   . Hypercholesteremia   . Hypertension   . Thyroid disease     Past Surgical History:  Procedure Laterality Date  . CHOLECYSTECTOMY    . COLONOSCOPY    . FOOT SURGERY  2011   right foot  . JOINT REPLACEMENT  2010   left knee  . POLYPECTOMY    . REPLACEMENT TOTAL KNEE Left   . TONSILLECTOMY      Social History   Socioeconomic History  . Marital status: Married    Spouse name: Not on file  . Number of children: 3  . Years of education: 59  . Highest education level: Not on file  Occupational History  . Occupation: Retired   Tobacco Use  . Smoking status: Former Smoker    Types: Cigarettes    Quit date: 11/04/2009    Years since quitting: 10.6  . Smokeless tobacco: Never Used  Vaping Use  . Vaping Use: Never used  Substance and Sexual Activity  . Alcohol use: No  . Drug use: No  . Sexual activity: Never  Other Topics Concern  . Not on file  Social History Narrative   Cares for 16yo husband; has daughter locally who can help if needed   Social Determinants of Health   Financial Resource Strain:   . Difficulty of Paying Living Expenses: Not on file  Food Insecurity:   . Worried About Charity fundraiser in the Last Year: Not on file  . Ran Out of Food in the Last Year: Not on file  Transportation Needs:   . Lack of  Transportation (Medical): Not on file  . Lack of Transportation (Non-Medical): Not on file  Physical Activity:   . Days of Exercise per Week: Not on file  . Minutes of Exercise per Session: Not on file  Stress:   . Feeling of Stress : Not on file  Social Connections:   . Frequency  of Communication with Friends and Family: Not on file  . Frequency of Social Gatherings with Friends and Family: Not on file  . Attends Religious Services: Not on file  . Active Member of Clubs or Organizations: Not on file  . Attends Archivist Meetings: Not on file  . Marital Status: Not on file    Family History  Problem Relation Age of Onset  . Heart disease Mother   . Drug abuse Sister     Review of Systems  Constitutional: Negative for chills and fever.  Respiratory: Negative for cough, shortness of breath and wheezing.   Cardiovascular: Negative for chest pain, palpitations and leg swelling.  Gastrointestinal: Positive for constipation. Negative for abdominal pain and nausea.  Genitourinary: Positive for decreased urine volume and dysuria. Negative for frequency and hematuria.  Neurological: Positive for syncope.       Objective:   Vitals:   07/11/20 1455  BP: 134/84  Pulse: 83  Temp: 98.1 F (36.7 C)  SpO2: 99%   BP Readings from Last 3 Encounters:  07/11/20 134/84  06/19/20 107/64  01/16/20 134/80   Wt Readings from Last 3 Encounters:  07/11/20 190 lb (86.2 kg)  06/18/20 186 lb (84.4 kg)  01/16/20 196 lb (88.9 kg)   Body mass index is 31.62 kg/m.   Physical Exam    Constitutional: Appears well-developed and well-nourished. No distress.  HENT:  Head: Normocephalic and atraumatic.  Neck: Neck supple. No tracheal deviation present. No thyromegaly present.  No cervical lymphadenopathy Cardiovascular: Normal rate, regular rhythm and normal heart sounds.   No murmur heard. No carotid bruit .  No edema Pulmonary/Chest: Effort normal and breath sounds normal. No  respiratory distress. No has no wheezes. No rales.  Skin: Skin is warm and dry. Not diaphoretic.  Psychiatric: Normal mood and affect. Behavior is normal.    CT Ankle Right Wo Contrast CLINICAL DATA:  Right ankle pain after falling today.  EXAM: CT OF THE RIGHT ANKLE WITHOUT CONTRAST  TECHNIQUE: Multidetector CT imaging of the right ankle was performed according to the standard protocol. Multiplanar CT image reconstructions were also generated.  COMPARISON:  Radiographs 06/18/2020 and 12/07/2018.  FINDINGS: Bones/Joint/Cartilage  There is a nondisplaced intra-articular fracture of the distal tibia anterolaterally at the level of the insertion of the anterior inferior tibiofibular ligament. The adjacent anterior aspect of the distal fibula is also fractured. There is no widening of the ankle mortise. There is no displacement of the articular surface of the tibial plafond. The talar dome and medial malleolus are intact.  There is a minimally displaced intra-articular fracture involving medial cuneiform adjacent to the articulation with the 1st metatarsal base, best seen on image 21/7. No other fractures are seen within the midfoot or hindfoot. There are degenerative changes at the posterior facet of the subtalar joint.  Ligaments  Suboptimally assessed by CT.  Muscles and Tendons  The ankle tendons appear intact.  Soft tissues  Soft tissue swelling around the ankle, greatest laterally. In addition, there is mild swelling in the midfoot. No foreign body or focal fluid collection.  IMPRESSION: 1. Nondisplaced intra-articular fractures of the distal tibia anterolaterally and the adjacent anterior aspect of the distal fibula, likely mediated by the anterior inferior tibiofibular ligament. 2. Minimally displaced intra-articular fracture involving the medial cuneiform adjacent to the articulation with the 1st metatarsal base. 3. No other fractures identified. 4. Soft  tissue swelling around the ankle and midfoot.  Electronically Signed   By: Gwyndolyn Saxon  Lin Landsman M.D.   On: 06/18/2020 15:26 CT Shoulder Left Wo Contrast CLINICAL DATA:  Shoulder pain after fall  EXAM: CT OF THE UPPER LEFT EXTREMITY WITHOUT CONTRAST  TECHNIQUE: Multidetector CT imaging of the upper left extremity was performed according to the standard protocol.  COMPARISON:  Radiograph same day  FINDINGS: Bones/Joint/Cartilage  There is a comminuted impacted mildly displaced fracture seen through the greater tuberosity. Small fracture fragments are seen superiorly. There is impaction deformity at the posterolateral the humeral head. The humeral head is still well seated within the glenoid. No AC joint widening.  Ligaments  Suboptimally assessed by CT.  Muscles and Tendons  The muscles surrounding shoulder appear to be grossly intact no focal atrophy or tear. The tendons appear to be grossly intact.  Soft tissues  There is subcutaneous edema seen around the lateral of the shoulder.  IMPRESSION: Impacted comminuted mildly displaced fracture of the posterior greater tuberosity and humeral head. No dislocation.  Electronically Signed   By: Prudencio Pair M.D.   On: 06/18/2020 15:17 DG Chest Port 1 View CLINICAL DATA:  Recent fall with chest pain, initial encounter  EXAM: PORTABLE CHEST 1 VIEW  COMPARISON:  06/06/2018  FINDINGS: Cardiac shadow is within normal limits. The lungs are clear bilaterally. No acute bony abnormality is seen.  IMPRESSION: No acute abnormality noted.  Electronically Signed   By: Inez Catalina M.D.   On: 06/18/2020 13:25 DG Shoulder Left CLINICAL DATA:  Left shoulder pain after fall.  EXAM: LEFT SHOULDER - 2+ VIEW  COMPARISON:  July 11, 2018.  FINDINGS: Large defect is seen involving the greater tuberosity of the proximal left humerus concerning for moderately displaced fracture. Glenohumeral joint is unremarkable. Visualized  ribs are unremarkable.  IMPRESSION: Probable moderately displaced fracture involving greater tuberosity of proximal left humerus. CT scan is recommended for further evaluation.  Electronically Signed   By: Marijo Conception M.D.   On: 06/18/2020 09:19 DG Tibia/Fibula Right CLINICAL DATA:  Right ankle pain after fall.  EXAM: RIGHT TIBIA AND FIBULA - 2 VIEW  COMPARISON:  December 07, 2018.  FINDINGS: Mildly displaced fracture is seen involving the lateral aspect of the distal tibia with intra-articular extension. Soft tissues are unremarkable. No other bony abnormality is noted.  IMPRESSION: Mildly displaced distal tibial fracture with intra-articular extension.  Electronically Signed   By: Marijo Conception M.D.   On: 06/18/2020 09:17     Assessment & Plan:    See Problem List for Assessment and Plan of chronic medical problems.    This visit occurred during the SARS-CoV-2 public health emergency.  Safety protocols were in place, including screening questions prior to the visit, additional usage of staff PPE, and extensive cleaning of exam room while observing appropriate contact time as indicated for disinfecting solutions.

## 2020-07-11 ENCOUNTER — Other Ambulatory Visit: Payer: Self-pay

## 2020-07-11 ENCOUNTER — Ambulatory Visit (INDEPENDENT_AMBULATORY_CARE_PROVIDER_SITE_OTHER): Payer: Medicare Other | Admitting: Internal Medicine

## 2020-07-11 ENCOUNTER — Encounter: Payer: Self-pay | Admitting: Internal Medicine

## 2020-07-11 VITALS — BP 134/84 | HR 83 | Temp 98.1°F | Ht 65.0 in | Wt 190.0 lb

## 2020-07-11 DIAGNOSIS — Z23 Encounter for immunization: Secondary | ICD-10-CM

## 2020-07-11 DIAGNOSIS — R296 Repeated falls: Secondary | ICD-10-CM

## 2020-07-11 DIAGNOSIS — I10 Essential (primary) hypertension: Secondary | ICD-10-CM | POA: Diagnosis not present

## 2020-07-11 DIAGNOSIS — R300 Dysuria: Secondary | ICD-10-CM

## 2020-07-11 DIAGNOSIS — S82301A Unspecified fracture of lower end of right tibia, initial encounter for closed fracture: Secondary | ICD-10-CM

## 2020-07-11 DIAGNOSIS — S42252A Displaced fracture of greater tuberosity of left humerus, initial encounter for closed fracture: Secondary | ICD-10-CM | POA: Diagnosis not present

## 2020-07-11 DIAGNOSIS — E039 Hypothyroidism, unspecified: Secondary | ICD-10-CM

## 2020-07-11 LAB — COMPREHENSIVE METABOLIC PANEL
ALT: 12 U/L (ref 0–35)
AST: 14 U/L (ref 0–37)
Albumin: 4 g/dL (ref 3.5–5.2)
Alkaline Phosphatase: 52 U/L (ref 39–117)
BUN: 20 mg/dL (ref 6–23)
CO2: 35 mEq/L — ABNORMAL HIGH (ref 19–32)
Calcium: 8.8 mg/dL (ref 8.4–10.5)
Chloride: 101 mEq/L (ref 96–112)
Creatinine, Ser: 1.11 mg/dL (ref 0.40–1.20)
GFR: 48.15 mL/min — ABNORMAL LOW (ref >60.00)
Glucose, Bld: 90 mg/dL (ref 70–99)
Potassium: 3.2 mEq/L — ABNORMAL LOW (ref 3.5–5.1)
Sodium: 142 mEq/L (ref 135–145)
Total Bilirubin: 0.5 mg/dL (ref 0.2–1.2)
Total Protein: 6.3 g/dL (ref 6.0–8.3)

## 2020-07-11 LAB — CBC WITH DIFFERENTIAL/PLATELET
Basophils Absolute: 0 10*3/uL (ref 0.0–0.1)
Basophils Relative: 0.4 % (ref 0.0–3.0)
Eosinophils Absolute: 0.2 10*3/uL (ref 0.0–0.7)
Eosinophils Relative: 2.2 % (ref 0.0–5.0)
HCT: 39.4 % (ref 36.0–46.0)
Hemoglobin: 13.1 g/dL (ref 12.0–15.0)
Lymphocytes Relative: 29.3 % (ref 12.0–46.0)
Lymphs Abs: 2.4 10*3/uL (ref 0.7–4.0)
MCHC: 33.1 g/dL (ref 30.0–36.0)
MCV: 92.5 fl (ref 78.0–100.0)
Monocytes Absolute: 0.6 10*3/uL (ref 0.1–1.0)
Monocytes Relative: 7.5 % (ref 3.0–12.0)
Neutro Abs: 4.9 10*3/uL (ref 1.4–7.7)
Neutrophils Relative %: 60.6 % (ref 43.0–77.0)
Platelets: 300 10*3/uL (ref 150.0–400.0)
RBC: 4.26 Mil/uL (ref 3.87–5.11)
RDW: 13 % (ref 11.5–15.5)
WBC: 8.1 10*3/uL (ref 4.0–10.5)

## 2020-07-11 LAB — POC URINALSYSI DIPSTICK (AUTOMATED)
Blood, UA: NEGATIVE
Glucose, UA: NEGATIVE
Ketones, UA: NEGATIVE
Nitrite, UA: NEGATIVE
Protein, UA: POSITIVE — AB
Spec Grav, UA: >=1.03 — AB (ref 1.010–1.025)
Urobilinogen, UA: 1 E.U./dL
pH, UA: 5.5 (ref 5.0–8.0)

## 2020-07-11 LAB — TSH: TSH: 1.46 u[IU]/mL (ref 0.35–4.50)

## 2020-07-11 MED ORDER — CEPHALEXIN 500 MG PO CAPS: 500.0000 mg | ORAL_CAPSULE | Freq: Two times a day (BID) | ORAL | 0 refills | Status: AC

## 2020-07-11 NOTE — Patient Instructions (Addendum)
  A urine sample was taken today.  Have blood work downstairs.   Flu immunization administered today.     Medications reviewed and updated.  Changes include :   none     Please followup in 6 months

## 2020-07-11 NOTE — Assessment & Plan Note (Signed)
Subacute Following with orthopedics Healing without surgery Taking Tylenol for pain-we will contact orthopedics if her pain is not controlled

## 2020-07-11 NOTE — Assessment & Plan Note (Signed)
Chronic  Clinically euthyroid Currently taking levothyroxine 112 mcg daily Check tsh  Titrate med dose if needed

## 2020-07-11 NOTE — Assessment & Plan Note (Signed)
Chronic Blood pressure well controlled Continue amlodipine 5 mg daily, hydrochlorothiazide 25 mg daily and ramipril 10 mg daily CMP, CBC

## 2020-07-11 NOTE — Assessment & Plan Note (Signed)
Subacute Following with orthopedics Taking Tylenol for pain-we will contact orthopedics if her pain is not controlled Healing without surgery

## 2020-07-11 NOTE — Assessment & Plan Note (Signed)
Acute Hearing appears suggestive of UTI so I will go ahead and start Keflex 500 mg twice daily x7 days Will send urine for culture

## 2020-07-11 NOTE — Assessment & Plan Note (Signed)
Acute She had 2 mechanical falls recently.  One was related to walking on the floor that had just been mopped and the other when she tripped Stressed the danger of falls and he discussed fall prevention Her daughter has been in her house recently and did adjust her house to help prevent falls Stressed regular exercise especially balance exercises

## 2020-07-12 ENCOUNTER — Other Ambulatory Visit: Payer: Self-pay | Admitting: Internal Medicine

## 2020-07-14 LAB — URINE CULTURE

## 2020-07-21 ENCOUNTER — Other Ambulatory Visit: Payer: Self-pay | Admitting: Internal Medicine

## 2020-07-31 ENCOUNTER — Other Ambulatory Visit: Payer: Self-pay | Admitting: Internal Medicine

## 2020-08-03 ENCOUNTER — Other Ambulatory Visit: Payer: Self-pay | Admitting: Internal Medicine

## 2020-08-08 DIAGNOSIS — S42202D Unspecified fracture of upper end of left humerus, subsequent encounter for fracture with routine healing: Secondary | ICD-10-CM | POA: Diagnosis not present

## 2020-08-08 DIAGNOSIS — E7800 Pure hypercholesterolemia, unspecified: Secondary | ICD-10-CM | POA: Diagnosis not present

## 2020-08-08 DIAGNOSIS — S82201S Unspecified fracture of shaft of right tibia, sequela: Secondary | ICD-10-CM | POA: Diagnosis not present

## 2020-08-08 DIAGNOSIS — S82201D Unspecified fracture of shaft of right tibia, subsequent encounter for closed fracture with routine healing: Secondary | ICD-10-CM | POA: Diagnosis not present

## 2020-08-14 ENCOUNTER — Other Ambulatory Visit: Payer: Self-pay | Admitting: Internal Medicine

## 2020-08-30 ENCOUNTER — Other Ambulatory Visit: Payer: Self-pay | Admitting: Internal Medicine

## 2020-09-01 ENCOUNTER — Telehealth: Payer: Self-pay | Admitting: Internal Medicine

## 2020-09-01 MED ORDER — ALPRAZOLAM 0.5 MG PO TABS
ORAL_TABLET | ORAL | 0 refills | Status: DC
Start: 2020-09-01 — End: 2020-09-29

## 2020-09-01 NOTE — Telephone Encounter (Signed)
1.Medication Requested: ALPRAZolam (XANAX) 0.5 MG tablet 2. Pharmacy (Name, Street, [place]): [place], [place] Phone:  [phone]  Fax:  [phone]     3. On Med List: yes 4. Last Visit with PCP: 10.08.21 5. Next visit date with PCP: 04.08.22  Agent: Please be advised that RX refills may take up to 3 business days. We ask that you follow-up with your pharmacy.

## 2020-09-07 DIAGNOSIS — E7800 Pure hypercholesterolemia, unspecified: Secondary | ICD-10-CM | POA: Diagnosis not present

## 2020-09-07 DIAGNOSIS — S42202D Unspecified fracture of upper end of left humerus, subsequent encounter for fracture with routine healing: Secondary | ICD-10-CM | POA: Diagnosis not present

## 2020-09-07 DIAGNOSIS — S82201S Unspecified fracture of shaft of right tibia, sequela: Secondary | ICD-10-CM | POA: Diagnosis not present

## 2020-09-07 DIAGNOSIS — S82201D Unspecified fracture of shaft of right tibia, subsequent encounter for closed fracture with routine healing: Secondary | ICD-10-CM | POA: Diagnosis not present

## 2020-09-15 ENCOUNTER — Telehealth: Payer: Self-pay | Admitting: Internal Medicine

## 2020-09-15 NOTE — Telephone Encounter (Signed)
She should double check with her insurance to make sure it is covered, especially since she is over 74 years old.  If it is covered-okay to order.

## 2020-09-15 NOTE — Telephone Encounter (Signed)
   Patient requesting Cologuard kit

## 2020-09-16 NOTE — Telephone Encounter (Signed)
Spoke with patient.  She will check with insurance and call back.

## 2020-09-17 NOTE — Telephone Encounter (Signed)
   Patient states her insurance will cover Cologuard

## 2020-09-18 ENCOUNTER — Other Ambulatory Visit: Payer: Self-pay | Admitting: Internal Medicine

## 2020-09-22 ENCOUNTER — Other Ambulatory Visit: Payer: Self-pay

## 2020-09-22 DIAGNOSIS — Z1211 Encounter for screening for malignant neoplasm of colon: Secondary | ICD-10-CM

## 2020-09-23 NOTE — Telephone Encounter (Signed)
Ordered today on website.

## 2020-09-25 ENCOUNTER — Telehealth: Payer: Self-pay | Admitting: Internal Medicine

## 2020-09-25 NOTE — Telephone Encounter (Signed)
Copied from [place] [id number]. Topic: Medicare AWV >> Sep 25, 2020 12:06 PM Cher Nakai R wrote: Reason for CRM:   Left message for patient to call back and schedule Medicare Annual Wellness Visit (AWV) in office.   If not able to come in office, please offer to do virtually.   Last AWVS: 10/11/2018  Please schedule at anytime with the Nurse Health Advisor.

## 2020-09-29 ENCOUNTER — Other Ambulatory Visit: Payer: Self-pay | Admitting: Internal Medicine

## 2020-09-29 ENCOUNTER — Telehealth: Payer: Self-pay | Admitting: Internal Medicine

## 2020-09-29 DIAGNOSIS — F419 Anxiety disorder, unspecified: Secondary | ICD-10-CM

## 2020-09-29 MED ORDER — ALPRAZOLAM 0.5 MG PO TABS: ORAL_TABLET | ORAL | 0 refills | Status: DC

## 2020-09-29 NOTE — Telephone Encounter (Signed)
1.Medication Requested: ALPRAZolam (XANAX) 0.5 MG tablet    2. Pharmacy (Name, Street, [place]): [facility], [address]  3. On Med List: yes   4. Last Visit with PCP: 10.8.2021  5. Next visit date with PCP: 4.8.2022   Agent: Please be advised that RX refills may take up to 3 business days. We ask that you follow-up with your pharmacy.

## 2020-09-30 ENCOUNTER — Telehealth: Payer: Self-pay

## 2020-09-30 NOTE — Telephone Encounter (Signed)
Tried to reach patient regarding request for Cologuard.  Patient had last one done September 5,2020.  She will not be due for another 3 years and insurance will not cover it.

## 2020-10-06 ENCOUNTER — Other Ambulatory Visit: Payer: Self-pay | Admitting: Internal Medicine

## 2020-10-28 ENCOUNTER — Telehealth: Payer: Self-pay | Admitting: Internal Medicine

## 2020-10-28 DIAGNOSIS — F419 Anxiety disorder, unspecified: Secondary | ICD-10-CM

## 2020-10-28 NOTE — Telephone Encounter (Signed)
Sent today by Dr Ronnald Ramp

## 2020-10-28 NOTE — Telephone Encounter (Signed)
1.Medication Requested: ALPRAZolam (XANAX) 0.5 MG tablet    2. Pharmacy (Name, Street, [place]): [place], [address]  3. On Med List:  4. Last Visit with PCP: 10.8.21  5. Next visit date with PCP: 4.8.22   Agent: Please be advised that RX refills may take up to 3 business days. We ask that you follow-up with your pharmacy.

## 2020-10-29 ENCOUNTER — Other Ambulatory Visit: Payer: Self-pay | Admitting: Internal Medicine

## 2020-10-29 DIAGNOSIS — F419 Anxiety disorder, unspecified: Secondary | ICD-10-CM

## 2020-11-10 ENCOUNTER — Other Ambulatory Visit: Payer: Self-pay | Admitting: Internal Medicine

## 2020-11-26 ENCOUNTER — Other Ambulatory Visit: Payer: Self-pay | Admitting: Internal Medicine

## 2020-11-26 DIAGNOSIS — F419 Anxiety disorder, unspecified: Secondary | ICD-10-CM

## 2020-12-10 DIAGNOSIS — H52203 Unspecified astigmatism, bilateral: Secondary | ICD-10-CM | POA: Diagnosis not present

## 2020-12-10 DIAGNOSIS — Z961 Presence of intraocular lens: Secondary | ICD-10-CM | POA: Diagnosis not present

## 2020-12-10 DIAGNOSIS — H524 Presbyopia: Secondary | ICD-10-CM | POA: Diagnosis not present

## 2020-12-23 ENCOUNTER — Other Ambulatory Visit: Payer: Self-pay | Admitting: Internal Medicine

## 2020-12-23 DIAGNOSIS — F419 Anxiety disorder, unspecified: Secondary | ICD-10-CM

## 2021-01-07 ENCOUNTER — Other Ambulatory Visit: Payer: Self-pay | Admitting: Internal Medicine

## 2021-01-08 DIAGNOSIS — N1831 Chronic kidney disease, stage 3a: Secondary | ICD-10-CM | POA: Insufficient documentation

## 2021-01-08 DIAGNOSIS — N183 Chronic kidney disease, stage 3 unspecified: Secondary | ICD-10-CM | POA: Insufficient documentation

## 2021-01-08 DIAGNOSIS — I700 Atherosclerosis of aorta: Secondary | ICD-10-CM | POA: Insufficient documentation

## 2021-01-08 NOTE — Patient Instructions (Addendum)
  Blood work was ordered.     Medications changes include :   Increase effexor to 225 mg  -- take three 75 mg daily.    Your prescription(s) have been submitted to your pharmacy. Please take as directed and contact our office if you believe you are having problem(s) with the medication(s).     Please followup in 6 months

## 2021-01-08 NOTE — Progress Notes (Signed)
Subjective:    Patient ID: Dawn Kim, female    DOB: 05-20-1946, 75 y.o.   MRN: [id number]  HPI The patient is here for follow up of their chronic medical problems, including htn, hypothyroidism, hyperlipidemia, gerd, insomnia, anxiety, depression, ckd  Her husband is 35 and can not do much.  She increased her effexor to 300 mg daily about one week ago.  She was hoping it would help her have more patience with her husband.    She walks slowly.  She is unsteady. She uses a cane.      Medications and allergies reviewed with patient and updated if appropriate.  Patient Active Problem List   Diagnosis Date Noted  . Aortic atherosclerosis ([place]) 01/08/2021  . CKD (chronic kidney disease) stage 3, GFR 30-59 ml/min ([facility]) 01/08/2021  . Fracture, humerus, greater tuberosity 07/10/2020  . Closed tibia fracture 07/10/2020  . 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 Confusion 06/06/2018  . Recurrent falls 06/06/2018  . T12 compression fracture ([place]) 03/28/2018  . Midline low back pain without sciatica 03/21/2018  . Chronic lower back pain 09/19/2017  . 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 Osteopenia 06/21/2016  . Hypothyroidism 10/22/2015  . Depression 10/22/2015  . Anxiety 10/22/2015  . GERD (gastroesophageal reflux disease) 10/22/2015  . Insomnia 10/22/2015  . Bilateral foot pain 10/22/2015  . Bilateral knee pain 10/22/2015  . HTN (hypertension) 01/20/2012  . Hypercholesterolemia 01/20/2012    Current Outpatient Medications on File Prior to Visit  Medication Sig Dispense Refill  . acetaminophen (TYLENOL) 325 MG tablet Take 650 mg by mouth every 6 (six) hours as needed for mild pain or moderate pain.     [name] ALPRAZolam (XANAX) 0.5 MG tablet TAKE 1 TABLET BY MOUTH THREE TIMES DAILY AS NEEDED FOR ANXIETY . DO NOT EXCEED 3 PER 24 HOURS 90 tablet 0  . amLODipine (NORVASC) 5 MG tablet Take 1 tablet by mouth once daily 90 tablet 0  . calcium carbonate  (OS-CAL - DOSED IN MG OF ELEMENTAL CALCIUM) 1250 (500 Ca) MG tablet Take 1 tablet (500 mg of elemental calcium total) by mouth daily.    . cholecalciferol (VITAMIN D) 1000 UNITS tablet Take 1,000 Units by mouth daily.    . cilostazol (PLETAL) 50 MG tablet Take 0.5 tablets (25 mg total) by mouth 2 (two) times daily. 60 tablet 1  . EUTHYROX 112 MCG tablet Take 1 tablet by mouth once daily 90 tablet 0  . famotidine (PEPCID) 40 MG tablet TAKE 1 TABLET BY MOUTH AT BEDTIME 90 tablet 0  . ferrous sulfate 325 (65 FE) MG EC tablet Take 325 mg by mouth daily with breakfast.    . fish oil-omega-3 fatty acids 1000 MG capsule Take 1 g by mouth 2 (two) times daily.     . hydrochlorothiazide (HYDRODIURIL) 25 MG tablet Take 1 tablet by mouth once daily 30 tablet 0  . levocetirizine (XYZAL) 5 MG tablet TAKE 1 TABLET BY MOUTH ONCE DAILY IN THE EVENING 90 tablet 0  . Multiple Vitamin (MULTIVITAMIN) tablet Take 1 tablet by mouth daily.    [name] omeprazole (PRILOSEC) 40 MG capsule TAKE 1 CAPSULE BY MOUTH TWICE DAILY BEFORE A MEAL 180 capsule 0  . QUEtiapine (SEROQUEL) 100 MG tablet TAKE 1 TABLET BY MOUTH AT BEDTIME 90 tablet 0  . ramipril (ALTACE) 10 MG capsule Take 1 capsule by mouth twice daily (Patient taking differently: Take 10 mg by mouth daily.) 180 capsule  0  . simvastatin (ZOCOR) 40 MG tablet Take 1 tablet by mouth in the evening 90 tablet 0   No current facility-administered medications on file prior to visit.    Past Medical History:  Diagnosis Date  . Anxiety   . Cataract    Bil  . Depression   . Hypercholesteremia   . Hypertension   . Thyroid disease     Past Surgical History:  Procedure Laterality Date  . CHOLECYSTECTOMY    . COLONOSCOPY    . FOOT SURGERY  2011   right foot  . JOINT REPLACEMENT  2010   left knee  . POLYPECTOMY    . REPLACEMENT TOTAL KNEE Left   . TONSILLECTOMY      Social History   Socioeconomic History  . Marital status: Married    Spouse name: Not on file  .  Number of children: 3  . Years of education: 24  . Highest education level: Not on file  Occupational History  . Occupation: Retired   Tobacco Use  . Smoking status: Former Smoker    Types: Cigarettes    Quit date: 11/04/2009    Years since quitting: 11.1  . Smokeless tobacco: Never Used  Vaping Use  . Vaping Use: Never used  Substance and Sexual Activity  . Alcohol use: No  . Drug use: No  . Sexual activity: Never  Other Topics Concern  . Not on file  Social History Narrative   Cares for 40yo husband; has daughter locally who can help if needed   Social Determinants of Health   Financial Resource Strain: Not on file  Food Insecurity: Not on file  Transportation Needs: Not on file  Physical Activity: Not on file  Stress: Not on file  Social Connections: Not on file    Family History  Problem Relation Age of Onset  . Heart disease Mother   . Drug abuse Sister     Review of Systems  Constitutional: Negative for chills and fever.  Respiratory: Negative for cough, shortness of breath and wheezing.   Cardiovascular: Positive for palpitations (occ) and leg swelling (b/l). Negative for chest pain.  Gastrointestinal:       Jerrye Bushy controlled  Musculoskeletal: Positive for arthralgias (knees).  Neurological: Negative for light-headedness and headaches.       Objective:   Vitals:   01/09/21 1359  BP: 130/72  Pulse: 74  Temp: 98.6 F (37 C)  SpO2: 96%   BP Readings from Last 3 Encounters:  01/09/21 130/72  07/11/20 134/84  06/19/20 107/64   Wt Readings from Last 3 Encounters:  01/09/21 185 lb 3.2 oz (84 kg)  07/11/20 190 lb (86.2 kg)  06/18/20 186 lb (84.4 kg)   Body mass index is 30.82 kg/m.   Physical Exam    Constitutional: Appears well-developed and well-nourished. No distress.  HENT:  Head: Normocephalic and atraumatic.  Neck: Neck supple. No tracheal deviation present. No thyromegaly present.  No cervical lymphadenopathy Cardiovascular: Normal rate,  regular rhythm and normal heart sounds.   No murmur heard. No carotid bruit .  1+ b/l LE edema Pulmonary/Chest: Effort normal and breath sounds normal. No respiratory distress. No has no wheezes. No rales.  Skin: Skin is warm and dry. Not diaphoretic.  Psychiatric: Normal mood and affect. Behavior is normal.      Assessment & Plan:    See Problem List for Assessment and Plan of chronic medical problems.    This visit occurred during the SARS-CoV-2 public health  emergency.  Safety protocols were in place, including screening questions prior to the visit, additional usage of staff PPE, and extensive cleaning of exam room while observing appropriate contact time as indicated for disinfecting solutions.

## 2021-01-09 ENCOUNTER — Other Ambulatory Visit: Payer: Self-pay

## 2021-01-09 ENCOUNTER — Ambulatory Visit (INDEPENDENT_AMBULATORY_CARE_PROVIDER_SITE_OTHER): Payer: Medicare Other | Admitting: Internal Medicine

## 2021-01-09 ENCOUNTER — Encounter: Payer: Self-pay | Admitting: Internal Medicine

## 2021-01-09 VITALS — BP 130/72 | HR 74 | Temp 98.6°F | Ht 65.0 in | Wt 185.2 lb

## 2021-01-09 DIAGNOSIS — G4709 Other insomnia: Secondary | ICD-10-CM

## 2021-01-09 DIAGNOSIS — N1831 Chronic kidney disease, stage 3a: Secondary | ICD-10-CM

## 2021-01-09 DIAGNOSIS — F419 Anxiety disorder, unspecified: Secondary | ICD-10-CM

## 2021-01-09 DIAGNOSIS — E7800 Pure hypercholesterolemia, unspecified: Secondary | ICD-10-CM

## 2021-01-09 DIAGNOSIS — I10 Essential (primary) hypertension: Secondary | ICD-10-CM

## 2021-01-09 DIAGNOSIS — G8929 Other chronic pain: Secondary | ICD-10-CM

## 2021-01-09 DIAGNOSIS — M25561 Pain in right knee: Secondary | ICD-10-CM

## 2021-01-09 DIAGNOSIS — M25562 Pain in left knee: Secondary | ICD-10-CM

## 2021-01-09 DIAGNOSIS — E039 Hypothyroidism, unspecified: Secondary | ICD-10-CM | POA: Diagnosis not present

## 2021-01-09 DIAGNOSIS — F3289 Other specified depressive episodes: Secondary | ICD-10-CM

## 2021-01-09 DIAGNOSIS — K219 Gastro-esophageal reflux disease without esophagitis: Secondary | ICD-10-CM

## 2021-01-09 DIAGNOSIS — I700 Atherosclerosis of aorta: Secondary | ICD-10-CM

## 2021-01-09 LAB — LIPID PANEL
Cholesterol: 150 mg/dL (ref 0–200)
HDL: 52.1 mg/dL (ref >39.00)
LDL Cholesterol: 68 mg/dL (ref 0–99)
NonHDL: 97.42
Total CHOL/HDL Ratio: 3
Triglycerides: 149 mg/dL (ref 0.0–149.0)
VLDL: 29.8 mg/dL (ref 0.0–40.0)

## 2021-01-09 LAB — CBC WITH DIFFERENTIAL/PLATELET
Basophils Absolute: 0 10*3/uL (ref 0.0–0.1)
Basophils Relative: 0.3 % (ref 0.0–3.0)
Eosinophils Absolute: 0.2 10*3/uL (ref 0.0–0.7)
Eosinophils Relative: 2.8 % (ref 0.0–5.0)
HCT: 40.6 % (ref 36.0–46.0)
Hemoglobin: 13.7 g/dL (ref 12.0–15.0)
Lymphocytes Relative: 31.6 % (ref 12.0–46.0)
Lymphs Abs: 2.4 10*3/uL (ref 0.7–4.0)
MCHC: 33.7 g/dL (ref 30.0–36.0)
MCV: 90.8 fl (ref 78.0–100.0)
Monocytes Absolute: 0.7 10*3/uL (ref 0.1–1.0)
Monocytes Relative: 8.7 % (ref 3.0–12.0)
Neutro Abs: 4.3 10*3/uL (ref 1.4–7.7)
Neutrophils Relative %: 56.6 % (ref 43.0–77.0)
Platelets: 315 10*3/uL (ref 150.0–400.0)
RBC: 4.47 Mil/uL (ref 3.87–5.11)
RDW: 12.9 % (ref 11.5–15.5)
WBC: 7.6 10*3/uL (ref 4.0–10.5)

## 2021-01-09 LAB — TSH: TSH: 1.53 u[IU]/mL (ref 0.35–4.50)

## 2021-01-09 LAB — COMPREHENSIVE METABOLIC PANEL
ALT: 13 U/L (ref 0–35)
AST: 17 U/L (ref 0–37)
Albumin: 4.2 g/dL (ref 3.5–5.2)
Alkaline Phosphatase: 59 U/L (ref 39–117)
BUN: 26 mg/dL — ABNORMAL HIGH (ref 6–23)
CO2: 36 mEq/L — ABNORMAL HIGH (ref 19–32)
Calcium: 9.5 mg/dL (ref 8.4–10.5)
Chloride: 99 mEq/L (ref 96–112)
Creatinine, Ser: 1.11 mg/dL (ref 0.40–1.20)
GFR: 48.24 mL/min — ABNORMAL LOW (ref >60.00)
Glucose, Bld: 91 mg/dL (ref 70–99)
Potassium: 3.3 mEq/L — ABNORMAL LOW (ref 3.5–5.1)
Sodium: 141 mEq/L (ref 135–145)
Total Bilirubin: 0.6 mg/dL (ref 0.2–1.2)
Total Protein: 6.8 g/dL (ref 6.0–8.3)

## 2021-01-09 LAB — HEMOGLOBIN A1C: Hgb A1c MFr Bld: 5.4 % (ref 4.6–6.5)

## 2021-01-09 MED ORDER — VENLAFAXINE HCL ER 150 MG PO CP24: 150.0000 mg | ORAL_CAPSULE | Freq: Every day | ORAL | 3 refills | Status: DC

## 2021-01-09 MED ORDER — VENLAFAXINE HCL ER 75 MG PO CP24: 75.0000 mg | ORAL_CAPSULE | Freq: Every day | ORAL | 1 refills | Status: DC

## 2021-01-09 NOTE — Assessment & Plan Note (Signed)
Chronic  Clinically euthyroid Currently taking levothyroxine 112 mcg daily Check tsh  Titrate med dose if needed

## 2021-01-09 NOTE — Assessment & Plan Note (Signed)
Chronic Discussed that she has chronic kidney disease Advised increased water intake Avoid nsaids Advised low salt diet, weight loss cmp

## 2021-01-09 NOTE — Assessment & Plan Note (Signed)
Chronic Check lipid panel  Continue simvastatin 40 mg  Regular exercise and healthy diet encouraged

## 2021-01-09 NOTE — Assessment & Plan Note (Signed)
Chronic Not controlled -would like to increased medication - advised 300 mg is too much Increase effexor to 225 mg daily

## 2021-01-09 NOTE — Assessment & Plan Note (Signed)
Chronic Deferred PT Will try to do exercises on her own  Use cane

## 2021-01-09 NOTE — Assessment & Plan Note (Signed)
Chronic GERD controlled Continue famotidine 40 mg, omeprazole 40 mg bid

## 2021-01-09 NOTE — Assessment & Plan Note (Signed)
Chronic BP well controlled Continue amlodipine 5 mg qd, hctz 25 mg qd, ramipril 10 mg daily cmp

## 2021-01-09 NOTE — Assessment & Plan Note (Signed)
Chronic Continue simvastatin 40 mg daily Encouraged healthy diet

## 2021-01-09 NOTE — Assessment & Plan Note (Signed)
Chronic Controlled, stable Continue seroquel 100 mg nightly

## 2021-01-09 NOTE — Addendum Note (Signed)
Addended by: Boris Lown B on: 01/09/2021 02:41 PM   Modules accepted: Orders

## 2021-01-11 MED ORDER — POTASSIUM CHLORIDE ER 10 MEQ PO TBCR: 20.0000 meq | EXTENDED_RELEASE_TABLET | Freq: Every day | ORAL | 5 refills | Status: DC

## 2021-01-11 NOTE — Addendum Note (Signed)
Addended by: Binnie Rail on: 01/11/2021 02:21 PM   Modules accepted: Orders

## 2021-01-21 ENCOUNTER — Other Ambulatory Visit: Payer: Self-pay | Admitting: Internal Medicine

## 2021-01-21 DIAGNOSIS — F419 Anxiety disorder, unspecified: Secondary | ICD-10-CM

## 2021-01-31 ENCOUNTER — Other Ambulatory Visit: Payer: Self-pay | Admitting: Internal Medicine

## 2021-02-19 ENCOUNTER — Other Ambulatory Visit: Payer: Self-pay | Admitting: Internal Medicine

## 2021-02-19 DIAGNOSIS — F419 Anxiety disorder, unspecified: Secondary | ICD-10-CM

## 2021-02-19 NOTE — Telephone Encounter (Signed)
Patient calling on status of refill

## 2021-02-20 ENCOUNTER — Other Ambulatory Visit: Payer: Self-pay | Admitting: Family Medicine

## 2021-02-20 ENCOUNTER — Telehealth: Payer: Self-pay | Admitting: Internal Medicine

## 2021-02-20 MED ORDER — CILOSTAZOL 50 MG PO TABS: 25.0000 mg | ORAL_TABLET | Freq: Two times a day (BID) | ORAL | 5 refills | Status: DC

## 2021-02-20 NOTE — Telephone Encounter (Signed)
Yes - should be taking both BP meds   Refill for cilostazol sent to pharm

## 2021-02-20 NOTE — Telephone Encounter (Signed)
Patient wondering if she should take both the  ramipril (ALTACE) 10 MG capsule and the hydrochlorothiazide (HYDRODIURIL) 25 MG tablet

## 2021-02-23 ENCOUNTER — Telehealth: Payer: Self-pay | Admitting: Internal Medicine

## 2021-02-23 NOTE — Telephone Encounter (Signed)
[facility] FYI:  --Caller states is on BP medications, Hydrochlorothiazide and Ramipril.  Advised to see PCP within 2 weeks, left patient a voicemail

## 2021-03-03 ENCOUNTER — Encounter: Payer: Self-pay | Admitting: Internal Medicine

## 2021-03-03 NOTE — Progress Notes (Signed)
Subjective:    Patient ID: Dawn Kim, female    DOB: 06-Jul-1946, 75 y.o.   MRN: [id number]  HPI The patient is here for follow up of their chronic medical problems, including htn, hld, hypothyroidism, gerd, insomnia, anxiety, depression, CKD  No labs  Medications and allergies reviewed with patient and updated if appropriate.  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 DO NOT EXCEED 3 PER 24 HOURS 90 tablet 0   acetaminophen (TYLENOL) 325 MG tablet Take 650 mg by mouth every 6 (six) hours as needed for mild pain or moderate pain.      amLODipine (NORVASC) 5 MG tablet Take 1 tablet by mouth once daily 90 tablet 0   calcium carbonate (OS-CAL - DOSED IN MG OF ELEMENTAL CALCIUM) 1250 (500 Ca) MG tablet Take 1 tablet (500 mg of elemental calcium total) by mouth daily.     cholecalciferol (VITAMIN D) 1000 UNITS tablet Take 1,000 Units by mouth daily.     cilostazol (PLETAL) 50 MG tablet Take 0.5  tablets (25 mg total) by mouth 2 (two) times daily. 60 tablet 5   EUTHYROX 112 MCG tablet Take 1 tablet by mouth once daily 90 tablet 0   famotidine (PEPCID) 40 MG tablet TAKE 1 TABLET BY MOUTH AT BEDTIME 90 tablet 0   ferrous sulfate 325 (65 FE) MG EC tablet Take 325 mg by mouth daily with breakfast.     fish oil-omega-3 fatty acids 1000 MG capsule Take 1 g by mouth 2 (two) times daily.      hydrochlorothiazide (HYDRODIURIL) 25 MG tablet Take 1 tablet by mouth once daily 30 tablet 2   levocetirizine (XYZAL) 5 MG tablet TAKE 1 TABLET BY MOUTH ONCE DAILY IN THE EVENING 90 tablet 0   Multiple Vitamin (MULTIVITAMIN) tablet Take 1 tablet by mouth daily.     omeprazole (PRILOSEC) 40 MG capsule TAKE 1 CAPSULE BY MOUTH TWICE DAILY BEFORE A MEAL 180 capsule 0   potassium chloride (KLOR-CON) 10 MEQ tablet Take 2 tablets (20 mEq total) by mouth daily. 60 tablet 5   QUEtiapine (SEROQUEL) 100 MG tablet TAKE 1 TABLET BY MOUTH AT BEDTIME 90 tablet 0   ramipril (ALTACE) 10 MG capsule Take 1 capsule by mouth twice daily (Patient taking differently: Take 10 mg by mouth daily.) 180 capsule 0   simvastatin (ZOCOR) 40 MG tablet Take 1 tablet by mouth in the evening 90 tablet 0   venlafaxine XR (EFFEXOR XR) 75 MG 24 hr capsule Take 1  capsule (75 mg total) by mouth daily with breakfast. For total of 225 mg daily 90 capsule 1   venlafaxine XR (EFFEXOR-XR) 150 MG 24 hr capsule Take 1 capsule (150 mg total) by mouth daily with breakfast. For total of 225 mg daily 90 capsule 3   No current facility-administered medications on file prior to visit.    Past Medical History:  Diagnosis Date   Anxiety    Cataract    Bil   Depression    Hypercholesteremia    Hypertension    Thyroid disease     Past Surgical History:  Procedure Laterality Date   CHOLECYSTECTOMY     COLONOSCOPY     FOOT SURGERY  2011   right foot   JOINT REPLACEMENT  2010   left knee   POLYPECTOMY     REPLACEMENT TOTAL KNEE Left     TONSILLECTOMY      Social History   Socioeconomic History   Marital status: Married    Spouse name: Not on file   Number of children: 3   Years of education: 12   Highest education level: Not on file  Occupational History   Occupation: Retired   Tobacco Use   Smoking status: Former Smoker    Types: Cigarettes    Quit date: 11/04/2009    Years since quitting: 11.3   Smokeless tobacco: Never Used  Vaping Use   Vaping Use: Never used  Substance and Sexual Activity   Alcohol use: No   Drug use: No   Sexual activity: Never  Other Topics Concern   Not on file  Social History Narrative   Cares for 73yo husband; has daughter locally who can help if needed   Social Determinants of Health   Financial Resource Strain: Not on file  Food Insecurity: Not on file  Transportation Needs: Not on file  Physical Activity: Not on file  Stress: Not on file  Social Connections: Not on file    Family History  Problem Relation Age of Onset   Heart disease Mother    Drug abuse Sister     Review of Systems     Objective:  There were no vitals filed for this visit. BP Readings from Last 3 Encounters:  01/09/21 130/72  07/11/20 134/84  06/19/20 107/64   Wt Readings from Last 3 Encounters:  01/09/21 185 lb 3.2 oz (84 kg)  07/11/20 190 lb (86.2 kg)  06/18/20 186 lb (84.4 kg)   There is no height or weight on file to calculate BMI.   Physical Exam    Constitutional: Appears well-developed and well-nourished. No distress.  HENT:  Head: Normocephalic and atraumatic.  Neck: Neck supple. No tracheal deviation present. No thyromegaly present.  No cervical lymphadenopathy Cardiovascular: Normal rate, regular rhythm and normal heart sounds.   No murmur heard. No carotid bruit .  No edema Pulmonary/Chest: Effort normal and breath sounds normal. No respiratory distress. No has no wheezes. No rales.  Skin: Skin is warm and dry. Not diaphoretic.  Psychiatric: Normal mood and affect.  Behavior is normal.      Assessment & Plan:    See Problem List for Assessment and Plan of chronic medical problems.    This visit occurred during the SARS-CoV-2 public health emergency.  Safety protocols were in place, including screening questions prior to the visit, additional usage of staff PPE, and extensive cleaning of exam room while observing appropriate contact time as indicated for disinfecting solutions.   This encounter was  created in error - please disregard.

## 2021-03-03 NOTE — Patient Instructions (Addendum)
     Medications changes include :     Your prescription(s) have been submitted to your pharmacy. Please take as directed and contact our office if you believe you are having problem(s) with the medication(s).   A referral was ordered for        Someone from their office will call you to schedule an appointment.    Please followup in 6 months

## 2021-03-04 ENCOUNTER — Encounter: Payer: Medicare Other | Admitting: Internal Medicine

## 2021-03-04 DIAGNOSIS — G4709 Other insomnia: Secondary | ICD-10-CM

## 2021-03-04 DIAGNOSIS — N1831 Chronic kidney disease, stage 3a: Secondary | ICD-10-CM

## 2021-03-04 DIAGNOSIS — E039 Hypothyroidism, unspecified: Secondary | ICD-10-CM

## 2021-03-04 DIAGNOSIS — F419 Anxiety disorder, unspecified: Secondary | ICD-10-CM

## 2021-03-04 DIAGNOSIS — K219 Gastro-esophageal reflux disease without esophagitis: Secondary | ICD-10-CM

## 2021-03-04 DIAGNOSIS — E7800 Pure hypercholesterolemia, unspecified: Secondary | ICD-10-CM

## 2021-03-04 DIAGNOSIS — F3289 Other specified depressive episodes: Secondary | ICD-10-CM

## 2021-03-04 DIAGNOSIS — I10 Essential (primary) hypertension: Secondary | ICD-10-CM

## 2021-03-05 ENCOUNTER — Emergency Department (HOSPITAL_COMMUNITY): Payer: Medicare Other

## 2021-03-05 ENCOUNTER — Emergency Department (HOSPITAL_COMMUNITY)
Admission: EM | Admit: 2021-03-05 | Discharge: 2021-03-05 | Disposition: A | Discharge status: Home / Self Care | Payer: Medicare Other | Attending: Emergency Medicine | Admitting: Emergency Medicine

## 2021-03-05 ENCOUNTER — Other Ambulatory Visit: Payer: Self-pay

## 2021-03-05 ENCOUNTER — Encounter (HOSPITAL_COMMUNITY): Payer: Self-pay

## 2021-03-05 DIAGNOSIS — Z96652 Presence of left artificial knee joint: Secondary | ICD-10-CM | POA: Insufficient documentation

## 2021-03-05 DIAGNOSIS — E039 Hypothyroidism, unspecified: Secondary | ICD-10-CM | POA: Diagnosis not present

## 2021-03-05 DIAGNOSIS — N183 Chronic kidney disease, stage 3 unspecified: Secondary | ICD-10-CM | POA: Diagnosis not present

## 2021-03-05 DIAGNOSIS — Y92090 Kitchen in other non-institutional residence as the place of occurrence of the external cause: Secondary | ICD-10-CM | POA: Insufficient documentation

## 2021-03-05 DIAGNOSIS — W010XXA Fall on same level from slipping, tripping and stumbling without subsequent striking against object, initial encounter: Secondary | ICD-10-CM | POA: Diagnosis not present

## 2021-03-05 DIAGNOSIS — Z79899 Other long term (current) drug therapy: Secondary | ICD-10-CM | POA: Diagnosis not present

## 2021-03-05 DIAGNOSIS — S5001XA Contusion of right elbow, initial encounter: Secondary | ICD-10-CM | POA: Insufficient documentation

## 2021-03-05 DIAGNOSIS — M25521 Pain in right elbow: Secondary | ICD-10-CM | POA: Diagnosis not present

## 2021-03-05 DIAGNOSIS — Z87891 Personal history of nicotine dependence: Secondary | ICD-10-CM | POA: Insufficient documentation

## 2021-03-05 DIAGNOSIS — S4991XA Unspecified injury of right shoulder and upper arm, initial encounter: Secondary | ICD-10-CM | POA: Diagnosis present

## 2021-03-05 DIAGNOSIS — S40011A Contusion of right shoulder, initial encounter: Secondary | ICD-10-CM | POA: Insufficient documentation

## 2021-03-05 DIAGNOSIS — I129 Hypertensive chronic kidney disease with stage 1 through stage 4 chronic kidney disease, or unspecified chronic kidney disease: Secondary | ICD-10-CM | POA: Insufficient documentation

## 2021-03-05 DIAGNOSIS — W19XXXA Unspecified fall, initial encounter: Secondary | ICD-10-CM

## 2021-03-05 DIAGNOSIS — M25511 Pain in right shoulder: Secondary | ICD-10-CM | POA: Diagnosis not present

## 2021-03-05 MED ORDER — TRAMADOL HCL 50 MG PO TABS
50.0000 mg | ORAL_TABLET | Freq: Once | ORAL | Status: AC
  Administered 2021-03-05: 50 mg via ORAL
  Filled 2021-03-05: qty 1

## 2021-03-05 MED ORDER — TRAMADOL HCL 50 MG PO TABS: 50.0000 mg | ORAL_TABLET | Freq: Four times a day (QID) | ORAL | 0 refills | Status: AC | PRN

## 2021-03-05 NOTE — Discharge Instructions (Addendum)
You will need to make an appointment in order to follow-up with your orthopedist pending your right shoulder pain.  I have prescribed a short course of pain medication in order to help with your symptoms, please take this medication as prescribed.  Please be aware this medication can make you drowsy, lightheaded.  Do not alcohol or drive while taking this medication.

## 2021-03-05 NOTE — Progress Notes (Signed)
Orthopedic Tech Progress Note Patient Details:  Dawn Kim [birth year] [id number]  Ortho Devices Type of Ortho Device: Arm sling Ortho Device/Splint Interventions: Application   Post Interventions Patient Tolerated: Well Instructions Provided: Care of device   [name] 03/05/2021, 8:31 PM

## 2021-03-05 NOTE — ED Provider Notes (Signed)
[facility] DEPT Provider Note   CSN: [id number] Arrival date & time: 03/05/21  1749     History Chief Complaint  Patient presents with  . Fall  . Shoulder Pain  . Elbow Pain  . Leg Swelling    Dawn Kim is a 75 y.o. female.  75 y.o female with a PMH of anxiety, hypertension, thyroid disease presents to the ED status post fall yesterday.  Patient reports she was in the kitchen when she sustained a mechanical fall yesterday, states that EMS arrived to her home, they helped her stand up.  She reports worsening pain to the right shoulder, this has been ongoing without much improvement.  Pain is exacerbated with range of motion.  She has taken Tylenol without any relief.  Reports being right-handed, feels that trying to compensate with her left hand due to ongoing pain to the right shoulder.  She states she did not strike her head, there is no neck pain, she did not lose consciousness.  Patient is currently not on any blood thinners.  The history is provided by the patient.  Fall This is a new problem. The current episode started yesterday. The problem occurs constantly. The problem has been gradually worsening. Pertinent negatives include no chest pain, no abdominal pain, no headaches and no shortness of breath. The symptoms are aggravated by twisting. The symptoms are relieved by rest. She has tried acetaminophen for the symptoms.  Shoulder Pain Associated symptoms: no fever and no neck pain        Past Medical History:  Diagnosis Date  . Anxiety   . Cataract    Bil  . Depression   . Hypercholesteremia   . Hypertension   . Thyroid disease     Patient Active Problem List   Diagnosis Date Noted  . Aortic atherosclerosis ([place]) 01/08/2021  . CKD (chronic kidney disease) stage 3, GFR 30-59 ml/min ([facility]) 01/08/2021  . Fracture, humerus, greater tuberosity 07/10/2020  . Closed tibia fracture 07/10/2020  . Loss of transverse plantar arch  11/20/2018  . Confusion 06/06/2018  . Recurrent falls 06/06/2018  . T12 compression fracture ([place]) 03/28/2018  . Midline low back pain without sciatica 03/21/2018  . Chronic lower back pain 09/19/2017  . 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 Osteopenia 06/21/2016  . Hypothyroidism 10/22/2015  . Depression 10/22/2015  . Anxiety 10/22/2015  . GERD (gastroesophageal reflux disease) 10/22/2015  . Insomnia 10/22/2015  . Bilateral foot pain 10/22/2015  . Bilateral knee pain 10/22/2015  . HTN (hypertension) 01/20/2012  . Hypercholesterolemia 01/20/2012    Past Surgical History:  Procedure Laterality Date  . CHOLECYSTECTOMY    . COLONOSCOPY    . FOOT SURGERY  2011   right foot  . JOINT REPLACEMENT  2010   left knee  . POLYPECTOMY    . REPLACEMENT TOTAL KNEE Left   . TONSILLECTOMY       OB History   No obstetric history on file.     Family History  Problem Relation Age of Onset  . Heart disease Mother   . Drug abuse Sister     Social History   Tobacco Use  . Smoking status: Former Smoker    Types: Cigarettes    Quit date: 11/04/2009    Years since quitting: 11.3  . Smokeless tobacco: Never Used  Vaping Use  . Vaping Use: Never used  Substance Use Topics  . Alcohol use: No  . Drug use: No  Home Medications Prior to Admission medications   Medication Sig Start Date End Date Taking? Authorizing Provider  ALPRAZolam (XANAX) 0.5 MG tablet TAKE 1 TABLET BY MOUTH THREE TIMES DAILY AS NEEDED FOR ANXIETY . DO NOT EXCEED 3 PER 24 HOURS 02/19/21   Burns, Claudina Lick, MD  traMADol (ULTRAM) 50 MG tablet Take 1 tablet (50 mg total) by mouth every 6 (six) hours as needed for up to 3 days. 03/05/21 03/08/21 Yes Shelley Cocke, Beverley Fiedler, PA-C  acetaminophen (TYLENOL) 325 MG tablet Take 650 mg by mouth every 6 (six) hours as needed for mild pain or moderate pain.     [provider]  amLODipine (NORVASC) 5 MG tablet Take 1 tablet by mouth once daily 12/23/20   Binnie Rail, MD  calcium carbonate (OS-CAL - DOSED IN MG OF ELEMENTAL CALCIUM) 1250 (500 Ca) MG tablet Take 1 tablet (500 mg of elemental calcium total) by mouth daily. 05/31/16   Nche, Charlene Brooke, NP  cholecalciferol (VITAMIN D) 1000 UNITS tablet Take 1,000 Units by mouth daily.    [provider]  cilostazol (PLETAL) 50 MG tablet Take 0.5 tablets (25 mg total) by mouth 2 (two) times daily. 02/20/21   Binnie Rail, MD  EUTHYROX 112 MCG tablet Take 1 tablet by mouth once daily 12/23/20   Binnie Rail, MD  famotidine (PEPCID) 40 MG tablet TAKE 1 TABLET BY MOUTH AT BEDTIME 12/23/20   Binnie Rail, MD  ferrous sulfate 325 (65 FE) MG EC tablet Take 325 mg by mouth daily with breakfast.    [provider]  fish oil-omega-3 fatty acids 1000 MG capsule Take 1 g by mouth 2 (two) times daily.     [provider]  hydrochlorothiazide (HYDRODIURIL) 25 MG tablet Take 1 tablet by mouth once daily 01/21/21   Binnie Rail, MD  levocetirizine (XYZAL) 5 MG tablet TAKE 1 TABLET BY MOUTH ONCE DAILY IN THE EVENING 02/02/21   Binnie Rail, MD  Multiple Vitamin (MULTIVITAMIN) tablet Take 1 tablet by mouth daily.    [provider]  omeprazole (PRILOSEC) 40 MG capsule TAKE 1 CAPSULE BY MOUTH TWICE DAILY BEFORE A MEAL 12/23/20   Burns, Claudina Lick, MD  potassium chloride (KLOR-CON) 10 MEQ tablet Take 2 tablets (20 mEq total) by mouth daily. 01/11/21   Binnie Rail, MD  QUEtiapine (SEROQUEL) 100 MG tablet TAKE 1 TABLET BY MOUTH AT BEDTIME 12/23/20   Binnie Rail, MD  ramipril (ALTACE) 10 MG capsule Take 1 capsule by mouth twice daily Patient taking differently: Take 10 mg by mouth daily. 08/13/19   Binnie Rail, MD  simvastatin (ZOCOR) 40 MG tablet Take 1 tablet by mouth in the evening 12/23/20   Binnie Rail, MD  venlafaxine XR (EFFEXOR XR) 75 MG 24 hr capsule Take 1 capsule (75 mg total) by mouth daily with breakfast. For total of 225 mg daily 01/09/21   Binnie Rail, MD  venlafaxine  XR (EFFEXOR-XR) 150 MG 24 hr capsule Take 1 capsule (150 mg total) by mouth daily with breakfast. For total of 225 mg daily 01/09/21   Binnie Rail, MD    Allergies    Gabapentin, Lunesta [eszopiclone], Penicillins, Sonata [zaleplon], Ambien [zolpidem], and Amoxicillin  Review of Systems   Review of Systems  Constitutional: Negative for chills and fever.  Respiratory: Negative for shortness of breath.   Cardiovascular: Negative for chest pain.  Gastrointestinal: Negative for abdominal pain, nausea and vomiting.  Genitourinary: Negative  for flank pain.  Musculoskeletal: Positive for arthralgias and myalgias. Negative for neck pain.  Skin: Positive for color change.  Neurological: Negative for light-headedness and headaches.  All other systems reviewed and are negative.   Physical Exam Updated Vital Signs BP (!) 145/76   Pulse 78   Temp 97.8 F (36.6 C) (Oral)   Resp 16   Ht 5\' 3"  (1.6 m)   Wt 81.6 kg   SpO2 99%   BMI 31.89 kg/m   Physical Exam Vitals and nursing note reviewed.  Constitutional:      Appearance: Normal appearance. She is not ill-appearing.  HENT:     Head: Normocephalic and atraumatic.     Comments: No palpable deformities, no abrasions, no lacerations noted to the face or neck.    Mouth/Throat:     Mouth: Mucous membranes are moist.  Eyes:     Pupils: Pupils are equal, round, and reactive to light.  Neck:     Comments: Full ROM of the neck without any pain. Cardiovascular:     Rate and Rhythm: Normal rate.  Pulmonary:     Effort: Pulmonary effort is normal.     Breath sounds: No wheezing.  Abdominal:     General: Abdomen is flat.     Tenderness: There is no abdominal tenderness. There is no right CVA tenderness or left CVA tenderness.  Musculoskeletal:     Right shoulder: Tenderness present. No swelling, deformity, effusion or bony tenderness. Decreased range of motion. Normal strength. Normal pulse.     Left shoulder: Normal.     Cervical back:  Normal range of motion and neck supple.     Comments: Bruising noted to the right elbow, pain with palpation along the right bicep, bruising consistent with green and purple appearance.  No pain with palpation of the right clavicle, full range of motion with pain.  Skin:    General: Skin is warm and dry.     Findings: Bruising present.     Comments: Bruising noted to the right upper arm, especially at the right elbow joint.  Neurological:     Mental Status: She is alert and oriented to person, place, and time.     ED Results / Procedures / Treatments   Labs (all labs ordered are listed, but only abnormal results are displayed) Labs Reviewed - No data to display  EKG None  Radiology DG Shoulder Right  Result Date: 03/05/2021 CLINICAL DATA:  Fall.  Right shoulder pain. EXAM: RIGHT SHOULDER - 2+ VIEW COMPARISON:  None. FINDINGS: Limited two views study shows no evidence for an acute fracture or dislocation. No evidence for shoulder separation. Bones are diffusely demineralized. IMPRESSION: Negative. Electronically Signed   By: Misty Stanley M.D.   On: 03/05/2021 18:40   DG Elbow Complete Right  Result Date: 03/05/2021 CLINICAL DATA:  Fall, right elbow pain EXAM: RIGHT ELBOW - COMPLETE 3+ VIEW COMPARISON:  None. FINDINGS: No acute bony abnormality. Specifically, no fracture, subluxation, or dislocation. IMPRESSION: No acute bony abnormality. Electronically Signed   By: Rolm Baptise M.D.   On: 03/05/2021 18:42    Procedures Procedures   Medications Ordered in ED Medications  traMADol (ULTRAM) tablet 50 mg (50 mg Oral Given 03/05/21 2032)    ED Course  I have reviewed the triage vital signs and the nursing notes.  Pertinent labs & imaging results that were available during my care of the patient were reviewed by me and considered in my medical decision making (see chart  for details).    MDM Rules/Calculators/A&P     Patient presents status post mechanical fall yesterday, was  originally evaluated by EMS yesterday, however felt that the pain was tolerable.  Has been taking Tylenol for her right shoulder pain which has been progressively worsening.    On evaluation she reports pain with movement, there is limited range of motion due to pain.  There is bruising noted to the upper bicep, with palpable hematoma noted.  Significant bruising noted to the right elbow, she has full range of motion with pain of the right shoulder.  No pain with palpation along the clavicle, denies any neck pain, no loss of consciousness, currently not on any blood thinners.  X-ray of the right shoulder along with right elbow is without any acute findings, no dislocation or fracture.  Patient was given tramadol by my attending Dr. Eulis Foster who agrees with plan and management.  She will also be placed on a sling.  She does have an orthopedist that she follow-up is with, we did discuss outpatient follow-up in order to further evaluate any ligament injury.  Patient was placed on a splint, she will follow-up with orthopedics on an outpatient basis.  Given a short prescription for tramadol to help with pain control.  Patient remained stable, return precautions discussed at length.   Portions of this note were generated with [employer]. Dictation errors may occur despite best attempts at proofreading.  Final Clinical Impression(s) / ED Diagnoses Final diagnoses:  Fall, initial encounter  Acute pain of right shoulder    Rx / DC Orders ED Discharge Orders         Ordered    traMADol (ULTRAM) 50 MG tablet  Every 6 hours PRN        03/05/21 2115           Janeece Fitting, PA-C 03/05/21 2116    Daleen Bo, MD 03/07/21 1056

## 2021-03-05 NOTE — ED Notes (Signed)
Ortho Tech at bedside.  

## 2021-03-05 NOTE — ED Notes (Signed)
Called ortho for sling to be placed on patient

## 2021-03-05 NOTE — ED Triage Notes (Signed)
Patient states she was getting up from the couch to go to the kitchen and tripped. Patient c/o right shoulder and elbow pain. Patient denies hitting her head or having LOC.  Patient also c/o bilateral lower extremity swelling and states she is having decreased urinary output.

## 2021-03-05 NOTE — ED Provider Notes (Signed)
  Face-to-face evaluation   History: She presents for evaluation of right arm injury when she tripped and fell.  She denies injury to head, neck or back.  No other preceding symptoms.  She does not have any other acute complaints.  She is here with a family member.  Physical exam: Elderly, overweight, cooperative.  No respiratory distress.  Neck is nontender to palpation.  Right shoulder is nontender.  I am able to range the right elbow, wrist and hand without pain.  She has some bruising along the right dorsal forearm.  She is tender in the posterior humeral area.  There is no deformity of the right arm or shoulder.  Medical screening examination/treatment/procedure(s) were conducted as a shared visit with non-physician practitioner(s) and myself.  I personally evaluated the patient during the encounter    Daleen Bo, MD 03/07/21 1056

## 2021-03-15 NOTE — Progress Notes (Signed)
Subjective:    Patient ID: Dawn Kim, female    DOB: 28-Sep-1946, 75 y.o.   MRN: [id number]  HPI The patient is here for follow up of their chronic medical problems, including htn, hld, hypothyroidism, gerd, insomnia, anxiety, depression, CKD  No labs  Medications and allergies reviewed with patient and updated if appropriate.  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 DO NOT EXCEED 3 PER 24 HOURS 90 tablet 0   acetaminophen (TYLENOL) 325 MG tablet Take 650 mg by mouth every 6 (six) hours as needed for mild pain or moderate pain.      amLODipine (NORVASC) 5 MG tablet Take 1 tablet by mouth once daily 90 tablet 0   calcium carbonate (OS-CAL - DOSED IN MG OF ELEMENTAL CALCIUM) 1250 (500 Ca) MG tablet Take 1 tablet (500 mg of elemental calcium total) by mouth daily.     cholecalciferol (VITAMIN D) 1000 UNITS tablet Take 1,000 Units by mouth daily.     cilostazol (PLETAL) 50 MG tablet Take 0.5  tablets (25 mg total) by mouth 2 (two) times daily. 60 tablet 5   EUTHYROX 112 MCG tablet Take 1 tablet by mouth once daily 90 tablet 0   famotidine (PEPCID) 40 MG tablet TAKE 1 TABLET BY MOUTH AT BEDTIME 90 tablet 0   ferrous sulfate 325 (65 FE) MG EC tablet Take 325 mg by mouth daily with breakfast.     fish oil-omega-3 fatty acids 1000 MG capsule Take 1 g by mouth 2 (two) times daily.      hydrochlorothiazide (HYDRODIURIL) 25 MG tablet Take 1 tablet by mouth once daily 30 tablet 2   levocetirizine (XYZAL) 5 MG tablet TAKE 1 TABLET BY MOUTH ONCE DAILY IN THE EVENING 90 tablet 0   Multiple Vitamin (MULTIVITAMIN) tablet Take 1 tablet by mouth daily.     omeprazole (PRILOSEC) 40 MG capsule TAKE 1 CAPSULE BY MOUTH TWICE DAILY BEFORE A MEAL 180 capsule 0   potassium chloride (KLOR-CON) 10 MEQ tablet Take 2 tablets (20 mEq total) by mouth daily. 60 tablet 5   QUEtiapine (SEROQUEL) 100 MG tablet TAKE 1 TABLET BY MOUTH AT BEDTIME 90 tablet 0   ramipril (ALTACE) 10 MG capsule Take 1 capsule by mouth twice daily (Patient taking differently: Take 10 mg by mouth daily.) 180 capsule 0   simvastatin (ZOCOR) 40 MG tablet Take 1 tablet by mouth in the evening 90 tablet 0   venlafaxine XR (EFFEXOR XR) 75 MG 24 hr capsule Take 1  capsule (75 mg total) by mouth daily with breakfast. For total of 225 mg daily 90 capsule 1   venlafaxine XR (EFFEXOR-XR) 150 MG 24 hr capsule Take 1 capsule (150 mg total) by mouth daily with breakfast. For total of 225 mg daily 90 capsule 3   No current facility-administered medications on file prior to visit.    Past Medical History:  Diagnosis Date   Anxiety    Cataract    Bil   Depression    Hypercholesteremia    Hypertension    Thyroid disease     Past Surgical History:  Procedure Laterality Date   CHOLECYSTECTOMY     COLONOSCOPY     FOOT SURGERY  2011   right foot   JOINT REPLACEMENT  2010   left knee   POLYPECTOMY     REPLACEMENT TOTAL KNEE Left     TONSILLECTOMY      Social History   Socioeconomic History   Marital status: Married    Spouse name: Not on file   Number of children: 3   Years of education: 12   Highest education level: Not on file  Occupational History   Occupation: Retired   Tobacco Use   Smoking status: Former    Pack years: 0.00    Types: Cigarettes    Quit date: 11/04/2009    Years since quitting: 11.3   Smokeless tobacco: Never  Vaping Use   Vaping Use: Never used  Substance and Sexual Activity   Alcohol use: No   Drug use: No   Sexual activity: Never  Other Topics Concern   Not on file  Social History Narrative   Cares for 31yo husband; has daughter locally who can help if needed   Social Determinants of Health   Financial Resource Strain: Not on file  Food Insecurity: Not on file  Transportation Needs: Not on file  Physical Activity: Not on file  Stress: Not on file  Social Connections: Not on file    Family History  Problem Relation Age of Onset   Heart disease Mother    Drug abuse Sister     Review of Systems     Objective:  There were no vitals filed for this visit. BP Readings from Last 3 Encounters:  03/05/21 136/66  01/09/21 130/72  07/11/20 134/84   Wt Readings from Last 3 Encounters:  03/05/21 180 lb (81.6 kg)  01/09/21 185 lb 3.2 oz (84 kg)  07/11/20 190 lb (86.2 kg)   There is no height or weight on file to calculate BMI.   Physical Exam    Constitutional: Appears well-developed and well-nourished. No distress.  HENT:  Head: Normocephalic and atraumatic.  Neck: Neck supple. No tracheal deviation present. No thyromegaly present.  No cervical lymphadenopathy Cardiovascular: Normal rate, regular rhythm and normal heart sounds.   No murmur heard. No carotid bruit .  No edema Pulmonary/Chest: Effort normal and breath sounds normal. No respiratory distress. No has no wheezes. No rales.  Skin: Skin is warm and dry. Not diaphoretic.  Psychiatric: Normal mood and  affect. Behavior is normal.      Assessment & Plan:    See Problem List for Assessment and Plan of chronic medical problems.    This visit occurred during the SARS-CoV-2 public health emergency.  Safety protocols were in place, including screening questions prior to the visit, additional usage of staff PPE, and extensive cleaning of exam room while observing appropriate contact time as indicated for disinfecting solutions.  This encounter was created in error - please disregard.

## 2021-03-15 NOTE — Patient Instructions (Signed)
  Blood work was ordered.     Medications changes include :     Your prescription(s) have been submitted to your pharmacy. Please take as directed and contact our office if you believe you are having problem(s) with the medication(s).   A referral was ordered for        Someone from their office will call you to schedule an appointment.    Please followup in 6 months  

## 2021-03-16 ENCOUNTER — Telehealth: Payer: Self-pay | Admitting: Internal Medicine

## 2021-03-16 ENCOUNTER — Other Ambulatory Visit: Payer: Self-pay

## 2021-03-16 ENCOUNTER — Encounter: Payer: Medicare Other | Admitting: Internal Medicine

## 2021-03-16 DIAGNOSIS — F419 Anxiety disorder, unspecified: Secondary | ICD-10-CM

## 2021-03-16 MED ORDER — ALPRAZOLAM 0.5 MG PO TABS: ORAL_TABLET | ORAL | 0 refills | Status: DC

## 2021-03-16 NOTE — Telephone Encounter (Signed)
1.Medication Requested:    ALPRAZolam (XANAX) 0.5 MG tablet 2. Pharmacy (Name, Street, [place]): [facility], [address] 3. On Med List: yes 4. Last Visit with PCP: 4.8.22 was supposed to be seen on 6.13.22 5. Next visit date with PCP: 6.21.22  Agent: Please be advised that RX refills may take up to 3 business days. We ask that you follow-up with your pharmacy.

## 2021-03-23 NOTE — Progress Notes (Signed)
Subjective:    Patient ID: Dawn Kim, female    DOB: Apr 24, 1946, 75 y.o.   MRN: [id number]  HPI The patient is here for follow up of their chronic medical problems, including htn, hld, hypothyroidism, gerd, insomnia, anxiety, depression, CKD  She states 3 weeks of cold symptoms.  She is coughing up green sputum.  Her chronic SOB is slightly worse.  No fever.     She has fallen several times.  She uses a walker at night to go to the bathroom.   A couple of falls she has tripped over things.  If she steps up on something she is concerned she will loose her balance.  She does not exercise except for a couple of chair exercises.   She is not eating healthy - daughter looking into having meals delivered.    Medications and allergies reviewed with patient and updated if appropriate.  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      ALPRAZolam (XANAX) 0.5 MG tablet TAKE 1  TABLET BY MOUTH THREE TIMES DAILY AS NEEDED FOR ANXIETY . DO NOT EXCEED 3 PER 24 HOURS 90 tablet 0   amLODipine (NORVASC) 5 MG tablet Take 1 tablet by mouth once daily 90 tablet 0   calcium carbonate (OS-CAL - DOSED IN MG OF ELEMENTAL CALCIUM) 1250 (500 Ca) MG tablet Take 1 tablet (500 mg of elemental calcium total) by mouth daily.     cholecalciferol (VITAMIN D) 1000 UNITS tablet Take 1,000 Units by mouth daily.     cilostazol (PLETAL) 50 MG tablet Take 0.5 tablets (25 mg total) by mouth 2 (two) times daily. 60 tablet 5   EUTHYROX 112 MCG tablet Take 1 tablet by mouth once daily 90 tablet 0   famotidine (PEPCID) 40 MG tablet TAKE 1 TABLET BY MOUTH AT BEDTIME 90 tablet 0   ferrous sulfate 325 (65 FE) MG EC tablet Take 325 mg by mouth daily with breakfast.     fish oil-omega-3 fatty acids 1000 MG capsule Take 1 g by mouth 2 (two) times daily.      hydrochlorothiazide (HYDRODIURIL) 25 MG tablet Take 1 tablet by mouth once daily 30 tablet 2   levocetirizine (XYZAL) 5 MG tablet TAKE 1 TABLET BY MOUTH ONCE DAILY IN THE EVENING 90 tablet 0   Multiple Vitamin (MULTIVITAMIN) tablet Take 1 tablet by mouth daily.     omeprazole (PRILOSEC) 40 MG capsule TAKE 1 CAPSULE BY MOUTH TWICE DAILY BEFORE A MEAL  180 capsule 0   potassium chloride (KLOR-CON) 10 MEQ tablet Take 2 tablets (20 mEq total) by mouth daily. 60 tablet 5   QUEtiapine (SEROQUEL) 100 MG tablet TAKE 1 TABLET BY MOUTH AT BEDTIME 90 tablet 0   simvastatin (ZOCOR) 40 MG tablet Take 1 tablet by mouth in the evening 90 tablet 0   venlafaxine XR (EFFEXOR XR) 75 MG 24 hr capsule Take 1 capsule (75 mg total) by mouth daily with breakfast. For total of 225 mg daily 90 capsule 1   venlafaxine XR (EFFEXOR-XR) 150 MG 24 hr capsule Take 1 capsule (150 mg total) by mouth daily with breakfast. For total of 225 mg daily 90 capsule 3   ramipril (ALTACE) 10 MG capsule Take 1 capsule by mouth twice daily (Patient not taking: Reported on 03/24/2021) 180 capsule 0    No current facility-administered medications on file prior to visit.    Past Medical History:  Diagnosis Date   Anxiety    Cataract    Bil   Depression    Hypercholesteremia    Hypertension    Thyroid disease     Past Surgical History:  Procedure Laterality Date   CHOLECYSTECTOMY     COLONOSCOPY     FOOT SURGERY  2011   right foot   JOINT REPLACEMENT  2010   left knee   POLYPECTOMY     REPLACEMENT TOTAL KNEE Left    TONSILLECTOMY      Social History   Socioeconomic History   Marital status: Married    Spouse name: Not on file   Number of children: 3   Years of education: 12   Highest education level: Not on file  Occupational History   Occupation: Retired   Tobacco Use   Smoking status: Former    Pack years: 0.00    Types: Cigarettes    Quit date: 11/04/2009    Years since quitting: 11.3   Smokeless tobacco: Never  Vaping Use   Vaping Use: Never used  Substance and Sexual Activity   Alcohol use: No   Drug use: No   Sexual activity: Never  Other Topics Concern   Not on file  Social History Narrative   Cares for 69yo husband; has daughter locally who can help if needed   Social Determinants of Health   Financial Resource Strain: Not on file  Food Insecurity: Not on file  Transportation Needs: Not on file  Physical Activity: Not on file  Stress: Not on file  Social Connections: Not on file    Family History  Problem Relation Age of Onset   Heart disease Mother    Drug abuse Sister     Review of Systems  Constitutional:  Positive for appetite change (varies, decreased at times) and fatigue. Negative for fever.  HENT:  Positive for congestion. Negative for ear pain, sinus pain and sore throat.   Respiratory:  Positive for cough (green sputum) and shortness of breath (acute on chronic). Negative for chest tightness and wheezing.   Cardiovascular:  Negative for chest pain, palpitations and leg swelling.  Neurological:  Positive for  light-headedness. Negative for headaches.      Objective:   Vitals:   03/24/21 1449  BP: 124/82  Pulse: 82  Temp: 98.6 F (37 C)  SpO2: 94%   BP Readings from Last 3 Encounters:  03/24/21 124/82  03/16/21 128/80  03/05/21 136/66   Wt Readings from Last 3 Encounters:  03/24/21 184 lb (83.5 kg)  03/16/21 178  lb (80.7 kg)  03/05/21 180 lb (81.6 kg)   Body mass index is 32.59 kg/m.   Physical Exam    GENERAL APPEARANCE: Appears stated age, well appearing, NAD EYES: conjunctiva clear, no icterus HENT: bilateral tympanic membranes and ear canals normal, oropharynx with no erythema or exudates, trachea midline, no cervical or supraclavicular lymphadenopathy LUNGS: Unlabored breathing, good air entry bilaterally, clear to auscultation without wheeze or crackles CARDIOVASCULAR: Normal S1,S2 , no edema SKIN: Warm, dry      Assessment & Plan:    See Problem List for Assessment and Plan of chronic medical problems.    This visit occurred during the SARS-CoV-2 public health emergency.  Safety protocols were in place, including screening questions prior to the visit, additional usage of staff PPE, and extensive cleaning of exam room while observing appropriate contact time as indicated for disinfecting solutions.

## 2021-03-23 NOTE — Patient Instructions (Addendum)
     Medications changes include :   doxycyline 100 mg twice daily ( antibiotic)  Your prescription(s) have been submitted to your pharmacy. Please take as directed and contact our office if you believe you are having problem(s) with the medication(s).  A referral was ordered for sports medicine.     Please followup in 6 months

## 2021-03-24 ENCOUNTER — Other Ambulatory Visit: Payer: Self-pay

## 2021-03-24 ENCOUNTER — Ambulatory Visit (INDEPENDENT_AMBULATORY_CARE_PROVIDER_SITE_OTHER): Payer: Medicare Other | Admitting: Internal Medicine

## 2021-03-24 ENCOUNTER — Encounter: Payer: Self-pay | Admitting: Internal Medicine

## 2021-03-24 VITALS — BP 124/82 | HR 82 | Temp 98.6°F | Ht 63.0 in | Wt 184.0 lb

## 2021-03-24 DIAGNOSIS — I10 Essential (primary) hypertension: Secondary | ICD-10-CM | POA: Diagnosis not present

## 2021-03-24 DIAGNOSIS — E039 Hypothyroidism, unspecified: Secondary | ICD-10-CM | POA: Diagnosis not present

## 2021-03-24 DIAGNOSIS — M25511 Pain in right shoulder: Secondary | ICD-10-CM

## 2021-03-24 DIAGNOSIS — K219 Gastro-esophageal reflux disease without esophagitis: Secondary | ICD-10-CM | POA: Diagnosis not present

## 2021-03-24 DIAGNOSIS — N1831 Chronic kidney disease, stage 3a: Secondary | ICD-10-CM

## 2021-03-24 DIAGNOSIS — F419 Anxiety disorder, unspecified: Secondary | ICD-10-CM

## 2021-03-24 DIAGNOSIS — F3289 Other specified depressive episodes: Secondary | ICD-10-CM | POA: Diagnosis not present

## 2021-03-24 DIAGNOSIS — E7800 Pure hypercholesterolemia, unspecified: Secondary | ICD-10-CM

## 2021-03-24 DIAGNOSIS — R296 Repeated falls: Secondary | ICD-10-CM

## 2021-03-24 DIAGNOSIS — G4709 Other insomnia: Secondary | ICD-10-CM

## 2021-03-24 MED ORDER — DOXYCYCLINE HYCLATE 100 MG PO TABS: 100.0000 mg | ORAL_TABLET | Freq: Two times a day (BID) | ORAL | 0 refills | Status: AC

## 2021-03-25 NOTE — Assessment & Plan Note (Signed)
Chronic Stable Discussed dec salt intake, continue increased water intake Ideally we should dec some of her meds - stressed lifestyle changes

## 2021-03-25 NOTE — Assessment & Plan Note (Signed)
Chronic Controlled, stable Continue seroquel 100 mg HS  

## 2021-03-25 NOTE — Assessment & Plan Note (Addendum)
Chronic GERD controlled Continue omeprazole 40 mg bid,  pepcid 40 mg HS  Encouraged weight loss and change in diet so we can try to decrease medication

## 2021-03-25 NOTE — Assessment & Plan Note (Signed)
Chronic Continue simvastatin 40 mg qd Regular exercise and healthy diet encouraged

## 2021-03-25 NOTE — Assessment & Plan Note (Signed)
Chronic BP well controlled Continue norvasc 5 mg qd, hctz 25 mg qd cmp

## 2021-03-25 NOTE — Assessment & Plan Note (Signed)
Chronic  Clinically euthyroid Currently taking levothyroxine 112 mcg qd

## 2021-03-25 NOTE — Assessment & Plan Note (Signed)
Chronic Likely multifactorial Her knee OA, leg weakness, physical deconditioning are likely contributing Discussed polypharmacy may be contributing Discussed PT or possible neuro eval - she deferred both Discussed fall prevention Recommended using her walker consistently to ambulate

## 2021-03-25 NOTE — Assessment & Plan Note (Signed)
Chronic Fairly controlled  continue effexor 225 mg daily Continue xanax 0.5 mg tid - discussed ideally we should try to decrease this at some point, but she is very resistant

## 2021-03-25 NOTE — Assessment & Plan Note (Signed)
Chronic Controlled, stable Continue effexor 225 mg daily  

## 2021-03-26 ENCOUNTER — Other Ambulatory Visit: Payer: Self-pay | Admitting: Internal Medicine

## 2021-03-27 NOTE — Progress Notes (Signed)
I, Wendy Poet, LAT, ATC, am serving as scribe for Dr. Lynne Leader.  Subjective:    I'm seeing this patient as a consultation for: Dr. Billey Gosling. Note will be routed back to referring provider/PCP.  CC: Right shoulder pain  HPI: Pt is a 75 y/o female c/o R shoulder/upper arm pain since sustaining a fall on 03/04/21 while she was in the kitchen and landed on her R lateral shoulder. Pt was seen at the [facility] ED the following day and was prescribed prednisone, Tramadol, and given a sling. Today, pt locates pain to her R lateral upper arm.  Radiates: into her R forearm and wrist Aggravates: R shoulder AROM Treatments tried: Tramadol; Tylenol; Voltaren gel  Dx imaging: 03/05/21 R shoulder & R shoulder XR  Past medical history, Surgical history, Family history, Social history, Allergies, and medications have been entered into the medical record, reviewed.   Review of Systems: No new headache, visual changes, nausea, vomiting, diarrhea, constipation, dizziness, abdominal pain, skin rash, fevers, chills, night sweats, weight loss, swollen lymph nodes, body aches, joint swelling, muscle aches, chest pain, shortness of breath, mood changes, visual or auditory hallucinations.   Objective:    Vitals:   03/30/21 1553  BP: 138/82  Pulse: 70  SpO2: 97%   General: Well Developed, well nourished, and in no acute distress.  Neuro/Psych: Alert and oriented x3, extra-ocular muscles intact, able to move all 4 extremities, sensation grossly intact. Skin: Warm and dry, no rashes noted.  Respiratory: Not using accessory muscles, speaking in full sentences, trachea midline.  Cardiovascular: Pulses palpable, no extremity edema. Abdomen: Does not appear distended. MSK: Right shoulder normal-appearing Nontender. Range of motion abduction 100 degrees.  Internal rotation lumbar spine external rotation full. Strength diminished abduction 4/5 external rotation 4/5 internal rotation full. Unable to get  into adequate position testing for Hawkins or Neer's test due to guarding. Pulses cap refill and sensation are intact distally.   Lab and Radiology Results    EXAM: RIGHT SHOULDER - 2+ VIEW   COMPARISON:  None.   FINDINGS: Limited two views study shows no evidence for an acute fracture or dislocation. No evidence for shoulder separation. Bones are diffusely demineralized.   IMPRESSION: Negative.     Electronically Signed   By: Misty Stanley M.D.   On: 03/05/2021 18:40   I, Lynne Leader, personally (independently) visualized and performed the interpretation of the images attached in this note.   Diagnostic Limited MSK Ultrasound of: Right shoulder. Of note this exam is limited by body positioning and body habitus. Biceps tendon appears to be intact. Subscapularis tendon appears to be intact Supraspinatus tendon is not visible on ultrasound examination likely torn and retracted. Infraspinatus tendon appears to be intact Impression: Likely full-thickness retracted tear supraspinatus tendon.  Procedure: Real-time Ultrasound Guided Injection of right shoulder glenohumeral joint posterior approach Device: Philips Affiniti 50G Images permanently stored and available for review in PACS Verbal informed consent obtained.  Discussed risks and benefits of procedure. Warned about infection bleeding damage to structures skin hypopigmentation and fat atrophy among others. Patient expresses understanding and agreement Time-out conducted.   Noted no overlying erythema, induration, or other signs of local infection.   Skin prepped in a sterile fashion.   Local anesthesia: Topical Ethyl chloride.   With sterile technique and under real time ultrasound guidance: 40 mg of Kenalog and 2 mL of Marcaine injected into glenohumeral joint. Fluid seen entering the joint capsule.   Completed without difficulty  Pain immediately resolved suggesting accurate placement of the medication.   Advised to  call if fevers/chills, erythema, induration, drainage, or persistent bleeding.   Images permanently stored and available for review in the ultrasound unit.  Impression: Technically successful ultrasound guided injection.       Impression and Recommendations:    Assessment and Plan: 75 y.o. female with right shoulder pain after fall occurring about 4 weeks ago.  Although patient did not suffer a broken bone during the fall she very likely suffered a complete retracted tear of the supraspinatus tendon.  Discussed the situation with Anique and her daughter.  She would very much like to avoid surgery and they note that she is not a good surgical candidate and did not do a good job with the physical therapy/rehab after a knee surgery.  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  Based on all of these decision making options she would like to avoid surgery for this injury.  I discussed that a retracted full-thickness rotator cuff tear if not fixed soon may not be able to be repaired later.  They understand and accept.       Plan to treat conservatively.  Plan for steroid injection and referral to PT to work on range of motion.  She thinks that she can go to physical therapy once a week and will do some of the home exercises.  I fear that if we leave her to her own devices she will stop using that arm and will suffer a frozen shoulder.  Additionally she has had a few falls recently.  I will asked physical therapy to work on fall prevention as well.  Recheck in 6 weeks.   PDMP not reviewed this encounter. Orders Placed This Encounter  Procedures   [place] LIMITED JOINT SPACE STRUCTURES UP RIGHT(NO LINKED CHARGES)    Order Specific Question:   Reason for Exam (SYMPTOM  OR DIAGNOSIS REQUIRED)    Answer:   R shoulder pain    Order Specific Question:   Preferred imaging location?    Answer:   [place]   Ambulatory referral to Physical Therapy     Referral Priority:   Routine    Referral Type:   Physical Medicine    Referral Reason:   Specialty Services Required    Requested Specialty:   Physical Therapy    Number of Visits Requested:   1   No orders of the defined types were placed in this encounter.   Discussed warning signs or symptoms. Please see discharge instructions. Patient expresses understanding.   The above documentation has been reviewed and is accurate and complete Lynne Leader, M.D.

## 2021-03-30 ENCOUNTER — Ambulatory Visit: Payer: Self-pay

## 2021-03-30 ENCOUNTER — Ambulatory Visit: Payer: Medicare Other | Admitting: Family Medicine

## 2021-03-30 ENCOUNTER — Encounter: Payer: Self-pay | Admitting: Family Medicine

## 2021-03-30 ENCOUNTER — Other Ambulatory Visit: Payer: Self-pay

## 2021-03-30 VITALS — BP 138/82 | HR 70 | Ht 63.0 in | Wt 186.4 lb

## 2021-03-30 DIAGNOSIS — M25511 Pain in right shoulder: Secondary | ICD-10-CM

## 2021-03-30 DIAGNOSIS — R296 Repeated falls: Secondary | ICD-10-CM | POA: Diagnosis not present

## 2021-03-30 NOTE — Patient Instructions (Signed)
Thank you for coming in today.   Call or go to the ER if you develop a large red swollen joint with extreme pain or oozing puss.     I've referred you to Physical Therapy.  Let us know if you don't hear from them in one week.   Recheck in 6 weeks.    I am also asking PT to work on fall prevention.   I think you have a complete tear of one of the rotator cuff tendons.

## 2021-04-02 ENCOUNTER — Other Ambulatory Visit: Payer: Self-pay

## 2021-04-02 ENCOUNTER — Ambulatory Visit: Payer: Medicare Other | Attending: Family Medicine | Admitting: Rehabilitative and Restorative Service Providers"

## 2021-04-02 ENCOUNTER — Encounter: Payer: Self-pay | Admitting: Rehabilitative and Restorative Service Providers"

## 2021-04-02 DIAGNOSIS — M6281 Muscle weakness (generalized): Secondary | ICD-10-CM | POA: Diagnosis not present

## 2021-04-02 DIAGNOSIS — R296 Repeated falls: Secondary | ICD-10-CM | POA: Diagnosis not present

## 2021-04-02 DIAGNOSIS — R262 Difficulty in walking, not elsewhere classified: Secondary | ICD-10-CM

## 2021-04-02 DIAGNOSIS — M25611 Stiffness of right shoulder, not elsewhere classified: Secondary | ICD-10-CM | POA: Insufficient documentation

## 2021-04-02 DIAGNOSIS — M25511 Pain in right shoulder: Secondary | ICD-10-CM | POA: Diagnosis not present

## 2021-04-02 DIAGNOSIS — R2689 Other abnormalities of gait and mobility: Secondary | ICD-10-CM | POA: Diagnosis not present

## 2021-04-02 NOTE — Patient Instructions (Signed)
Access Code: [email or web address] URL: [email or web address] Date: 04/02/2021 Prepared by: Juel Burrow  Exercises Seated Scapular Retraction - 2 x daily - 7 x weekly - 1 sets - 10 reps Seated March - 2 x daily - 7 x weekly - 1 sets - 10 reps Seated Long Arc Quad - 2 x daily - 7 x weekly - 1 sets - 10 reps Shoulder Flexion Wall Slide with Towel - 2 x daily - 7 x weekly - 1 sets - 10 reps Standing Shoulder Abduction Slides at Wall - 2 x daily - 7 x weekly - 1 sets - 10 reps Sit to Stand Without Arm Support - 2 x daily - 7 x weekly - 1 sets - 10 reps

## 2021-04-02 NOTE — Therapy (Signed)
[place] [place], [place], [address] Phone: [phone]   Fax:  [phone]  Physical Therapy Evaluation  Patient Details  Name: Dawn Kim MRN: [id number] Date of Birth: October 02, 1946 Referring Provider (PT): Dr Lynne Leader   Encounter Date: 04/02/2021   PT End of Session - 04/02/21 1318     Visit Number 1    Date for PT Re-Evaluation 06/19/21    PT Start Time 9892    PT Stop Time 1194    PT Time Calculation (min) 45 min    Activity Tolerance Patient tolerated treatment well    Behavior During Therapy [facility] for tasks assessed/performed             Past Medical History:  Diagnosis Date   Anxiety    Cataract    Bil   Depression    Hypercholesteremia    Hypertension    Thyroid disease     Past Surgical History:  Procedure Laterality Date   CHOLECYSTECTOMY     COLONOSCOPY     FOOT SURGERY  2011   right foot   JOINT REPLACEMENT  2010   left knee   POLYPECTOMY     REPLACEMENT TOTAL KNEE Left    TONSILLECTOMY      There were no vitals filed for this visit.    Subjective Assessment - 04/02/21 1233     Subjective Patient reports that she has been having shoulder pain for approximately one month ago.  The pain started when she tripped and fell on her shoulder and was progressively getting worse.  She states that her shoulder is feeling better after the steriod injection given by Dr Georgina Snell. Pt reports that she is having difficulty getting dressed and doing her hair, as well as other household activities.  Pts PLOF was independent with ADLs and IADLs.    Patient is accompained by: Family member   Museum/gallery conservator, daughter   Limitations Lifting;House hold activities    Patient Stated Goals To be more stable on my feet and not fall again.    Currently in Pain? Yes    Pain Score 5     Pain Location Shoulder    Pain Orientation Right    Pain Descriptors / Indicators Sore    Pain Type Acute pain    Pain  Radiating Towards down R arm    Pain Onset More than a month ago    Pain Frequency Intermittent    Aggravating Factors  shoulder use    Pain Relieving Factors rest                [facility] PT Assessment - 04/02/21 0001       Assessment   Medical Diagnosis M25.511 (ICD-10-CM) - Acute pain of right shoulder  R29.6 (ICD-10-CM) - Frequent falls    Referring Provider (PT) Dr Lynne Leader    Hand Dominance Right    Next MD Visit August 10    Prior Therapy no      Precautions   Precautions Fall      Restrictions   Weight Bearing Restrictions No      Balance Screen   Has the patient fallen in the past 6 months Yes    How many times? 20    Has the patient had a decrease in activity level because of a fear of falling?  Yes    Is the patient reluctant to leave their home because of a fear of falling?  Yes  Home Environment   Living Environment Private residence    Living Arrangements Spouse/significant other    Type of [place] Access Level entry    [place] - 4 wheels;Grab bars - tub/shower;Shower seat      Prior Function   Level of Independence Independent    Vocation Retired    Leisure spend time with 42 year old grandson      Cognition   Overall Cognitive Status Within Functional Limits for tasks assessed      Observation/Other Assessments   Focus on Therapeutic Outcomes (FOTO)  36%, risk adjusted 47%      ROM / Strength   AROM / PROM / Strength AROM;Strength      AROM   Overall AROM  Deficits    AROM Assessment Site Shoulder    Right/Left Shoulder Right;Left    Right Shoulder Flexion 122 Degrees    Right Shoulder ABduction 118 Degrees    Left Shoulder Flexion 125 Degrees    Left Shoulder ABduction 130 Degrees      Strength   Overall Strength Comments R shoulder strength of 3/5, R elbow strength of 4/5.  L shoulder strength of 4/5, L elbow strength of 5/5.  BLE strength of 4 minus to 4/5 throughout       Transfers   Five time sit to stand comments  16 sec pushing up from thighs      Standardized Balance Assessment   Standardized Balance Assessment Timed Up and Go Test;Berg Balance Test      Berg Balance Test   Sit to Stand Able to stand  independently using hands    Standing Unsupported Able to stand safely 2 minutes    Sitting with Back Unsupported but Feet Supported on Floor or Stool Able to sit safely and securely 2 minutes    Stand to Sit Uses backs of legs against chair to control descent    Transfers Able to transfer safely, definite need of hands    Standing Unsupported with Eyes Closed Unable to keep eyes closed 3 seconds but stays steady    Standing Unsupported with Feet Together Able to place feet together independently and stand for 1 minute with supervision    From Standing, Reach Forward with Outstretched Arm Can reach forward >5 cm safely (2")    From Standing Position, Pick up Object from Floor Able to pick up shoe, needs supervision    From Standing Position, Turn to Look Behind Over each Shoulder Looks behind from both sides and weight shifts well    Turn 360 Degrees Able to turn 360 degrees safely one side only in 4 seconds or less    Standing Unsupported, Alternately Place Feet on Step/Stool Needs assistance to keep from falling or unable to try    Standing Unsupported, One Foot in Front Able to take small step independently and hold 30 seconds    Standing on One Leg Tries to lift leg/unable to hold 3 seconds but remains standing independently    Total Score 35      Timed Up and Go Test   TUG Normal TUG    Normal TUG (seconds) 14.3                        Objective measurements completed on examination: See above findings.               PT  Education - 04/02/21 1314     Education Details Pt provided with HEP    Person(s) Educated Patient;Child(ren)    Methods Explanation;Demonstration;Verbal cues    Comprehension Verbalized  understanding;Returned demonstration              PT Short Term Goals - 04/02/21 1329       PT SHORT TERM GOAL #1   Title Pt will be independent with initial HEP.    Time 2    Period Weeks    Status New               PT Long Term Goals - 04/02/21 1329       PT LONG TERM GOAL #1   Title Pt will be independent with advanced HEP.    Time 12    Period Weeks    Status New      PT LONG TERM GOAL #2   Title Pt will increase R shoulder ROM to [facility] to allow her to reach overhead.    Time 8    Period Weeks    Status New      PT LONG TERM GOAL #3   Title Patient will increase B UE/LE strength to at least 86minus/5 to allow her to perform functional activities around the home.    Time 12    Period Weeks    Status New      PT LONG TERM GOAL #4   Title Patient will increase BERG to at least 46/56 to decrease her risk of falling.    Time 12    Period Weeks    Status New      PT LONG TERM GOAL #5   Title Patient will be able to perform functional activities in home and community with no greater than 2/10 pain.    Time 12    Period Weeks    Status New                    Plan - 04/02/21 1319     Clinical Impression Statement Patient presents to outpatient PT with a referral from Dr Lynne Leader secondary to suspected R rotator cuff tear with R shoulder pain and frequent falls.  Pt presents with generalized weakness of UE and LE, as well as scores on TUG and BERG to place her at a risk of falling.  Patient presents with weakness of UE/LE, decreased balance, decreased R shoulder ROM, and R shoulder pain.  Patient reports that her shoulder ROM and pain have improved since recieving the injection in her R shoulder.  Ms Penninger would benefit from skilled PT to address her functional impairments to allow her to be safer and more independent in her home.    Personal Factors and Comorbidities Age;Fitness    Examination-Activity Limitations  Bathing;Dressing;Hygiene/Grooming;Stairs;Reach Overhead;Carry    Examination-Participation Restrictions Cleaning;Laundry    Stability/Clinical Decision Making Evolving/Moderate complexity    Clinical Decision Making Moderate    Rehab Potential Good    PT Frequency 2x / week    PT Duration 12 weeks    PT Treatment/Interventions ADLs/Self Care Home Management;Cryotherapy;Electrical Stimulation;Iontophoresis 4mg /ml Dexamethasone;Moist Heat;Ultrasound;Gait training;Stair training;Functional mobility training;Therapeutic activities;Therapeutic exercise;Balance training;Neuromuscular re-education;Patient/family education;Passive range of motion;Manual techniques;Dry needling;Taping;Vasopneumatic Device    PT Next Visit Plan strengthening, balance    PT Home Exercise Plan Access Code: [email or web address]    Consulted and Agree with Plan of Care Patient             Patient will benefit from  skilled therapeutic intervention in order to improve the following deficits and impairments:  Decreased balance, Increased muscle spasms, Decreased range of motion, Difficulty walking, Decreased strength, Impaired flexibility, Postural dysfunction, Pain, Decreased activity tolerance  Visit Diagnosis: Acute pain of right shoulder - Plan: PT plan of care cert/re-cert  Muscle weakness (generalized) - Plan: PT plan of care cert/re-cert  Balance problem - Plan: PT plan of care cert/re-cert  Frequent falls - Plan: PT plan of care cert/re-cert  Stiffness of right shoulder, not elsewhere classified - Plan: PT plan of care cert/re-cert  Difficulty in walking, not elsewhere classified - Plan: PT plan of care cert/re-cert     Problem List Patient Active Problem List   Diagnosis Date Noted   Aortic atherosclerosis ([place]) 01/08/2021   CKD (chronic kidney disease) stage 3, GFR 30-59 ml/min ([place]) 01/08/2021   Fracture, humerus, greater tuberosity 07/10/2020   Closed tibia fracture 07/10/2020   Loss of transverse plantar  arch 11/20/2018   Confusion 06/06/2018   Recurrent falls 06/06/2018   T12 compression fracture ([place]) 03/28/2018   Midline low back pain without sciatica 03/21/2018   Chronic lower back pain 09/19/2017   Pain due to total left knee replacement ([place]) 12/09/2016   Osteopenia 06/21/2016   Hypothyroidism 10/22/2015   Depression 10/22/2015   Anxiety 10/22/2015   GERD (gastroesophageal reflux disease) 10/22/2015   Insomnia 10/22/2015   Bilateral foot pain 10/22/2015   Bilateral knee pain 10/22/2015   HTN (hypertension) 01/20/2012   Hypercholesterolemia 01/20/2012    Juel Burrow, PT, DPT 04/02/2021, 1:35 PM  [place] [place], [place], [address] Phone: [phone]   Fax:  [phone]  Name: Dawn Kim MRN: [id number] Date of Birth: [birth year]

## 2021-04-14 ENCOUNTER — Ambulatory Visit: Payer: Medicare Other | Admitting: Physical Therapy

## 2021-04-15 ENCOUNTER — Telehealth: Payer: Self-pay | Admitting: Internal Medicine

## 2021-04-15 ENCOUNTER — Other Ambulatory Visit: Payer: Self-pay | Admitting: Internal Medicine

## 2021-04-15 DIAGNOSIS — F419 Anxiety disorder, unspecified: Secondary | ICD-10-CM

## 2021-04-15 MED ORDER — ALPRAZOLAM 0.5 MG PO TABS
ORAL_TABLET | ORAL | 0 refills | Status: DC
Start: 2021-04-15 — End: 2021-04-22

## 2021-04-15 NOTE — Telephone Encounter (Signed)
1.Medication Requested: ALPRAZolam (XANAX) 0.5 MG tablet   2. Pharmacy (Name, Street[facility] [place]): [facility] [address]  3. On Med List: yes   4. Last Visit with PCP: 03-24-21  5. Next visit date with PCP: 07-13-21   Agent: Please be advised that RX refills may take up to 3 business days. We ask that you follow-up with your pharmacy.

## 2021-04-16 ENCOUNTER — Ambulatory Visit: Payer: Medicare Other | Admitting: Physical Therapy

## 2021-04-20 ENCOUNTER — Other Ambulatory Visit: Payer: Self-pay | Admitting: Internal Medicine
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
## 2021-04-21 ENCOUNTER — Ambulatory Visit: Payer: Medicare Other | Admitting: Physical Therapy
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
## 2021-04-22 ENCOUNTER — Other Ambulatory Visit: Payer: Self-pay | Admitting: Internal Medicine

## 2021-04-22 DIAGNOSIS — F419 Anxiety disorder, unspecified: Secondary | ICD-10-CM

## 2021-04-22 MED ORDER — ALPRAZOLAM 0.5 MG PO TABS: ORAL_TABLET | ORAL | 0 refills | Status: DC

## 2021-04-22 NOTE — Telephone Encounter (Signed)
Follow up message   Patient requesting call back to discuss Xanax

## 2021-04-23 ENCOUNTER — Ambulatory Visit: Payer: Medicare Other | Admitting: Rehabilitative and Restorative Service Providers"

## 2021-05-04 ENCOUNTER — Encounter: Payer: Self-pay | Admitting: Internal Medicine

## 2021-05-13 ENCOUNTER — Ambulatory Visit: Payer: Medicare Other | Admitting: Family Medicine

## 2021-05-21 ENCOUNTER — Other Ambulatory Visit: Payer: Self-pay | Admitting: Internal Medicine

## 2021-06-10 ENCOUNTER — Ambulatory Visit: Payer: Medicare Other | Admitting: Internal Medicine

## 2021-06-19 ENCOUNTER — Other Ambulatory Visit: Payer: Self-pay | Admitting: Internal Medicine

## 2021-06-23 ENCOUNTER — Other Ambulatory Visit: Payer: Self-pay | Admitting: Internal Medicine

## 2021-07-08 ENCOUNTER — Telehealth: Payer: Self-pay | Admitting: Internal Medicine

## 2021-07-08 DIAGNOSIS — F419 Anxiety disorder, unspecified: Secondary | ICD-10-CM

## 2021-07-08 MED ORDER — ALPRAZOLAM 0.5 MG PO TABS: ORAL_TABLET | ORAL | 0 refills | Status: DC

## 2021-07-08 NOTE — Telephone Encounter (Signed)
   Patient requesting refill for ALPRAZolam (XANAX) 0.5 MG tablet

## 2021-07-08 NOTE — Addendum Note (Signed)
Addended by: Binnie Rail on: 07/08/2021 12:01 PM   Modules accepted: Orders

## 2021-07-09 ENCOUNTER — Other Ambulatory Visit: Payer: Self-pay | Admitting: Internal Medicine

## 2021-07-13 ENCOUNTER — Ambulatory Visit: Payer: Medicare Other | Admitting: Internal Medicine

## 2021-07-24 ENCOUNTER — Ambulatory Visit: Payer: Medicare Other | Admitting: Internal Medicine

## 2021-07-24 ENCOUNTER — Telehealth: Payer: Self-pay | Admitting: Internal Medicine

## 2021-07-24 NOTE — Telephone Encounter (Signed)
Patient is experiencing fluid in both ankles  Has appt scheduled for 10/31  Wants to know if provider can send over rx to pharmacy to help w/ fluid build up until her [name]: Dawn Kim, [place] Phone:  [phone]  Fax:  [phone]

## 2021-07-24 NOTE — Telephone Encounter (Signed)
Needs appt - can move up appt

## 2021-07-29 ENCOUNTER — Ambulatory Visit: Payer: Medicare Other | Admitting: Internal Medicine

## 2021-08-03 ENCOUNTER — Ambulatory Visit: Payer: Medicare Other | Admitting: Internal Medicine

## 2021-08-03 ENCOUNTER — Other Ambulatory Visit: Payer: Self-pay | Admitting: Internal Medicine

## 2021-08-03 DIAGNOSIS — F419 Anxiety disorder, unspecified: Secondary | ICD-10-CM

## 2021-08-16 ENCOUNTER — Other Ambulatory Visit: Payer: Self-pay | Admitting: Internal Medicine

## 2021-08-31 ENCOUNTER — Encounter: Payer: Self-pay | Admitting: Internal Medicine

## 2021-08-31 NOTE — Patient Instructions (Addendum)
    Blood work was ordered.     Medications changes include :  none   Your prescription(s) have been submitted to your pharmacy. Please take as directed and contact our office if you believe you are having problem(s) with the medication(s).   Please followup in 6 months  

## 2021-08-31 NOTE — Progress Notes (Signed)
Subjective:    Patient ID: Dawn Kim, female    DOB: 13-Jun-1946, 75 y.o.   MRN: [id number]  This visit occurred during the SARS-CoV-2 public health emergency.  Safety protocols were in place, including screening questions prior to the visit, additional usage of staff PPE, and extensive cleaning of exam room while observing appropriate contact time as indicated for disinfecting solutions.     HPI The patient is here for follow up of their chronic medical problems, including htn, hld, hypothyroidism, gerd, insomnia, anxiety, depression, CKD.  Her daughter is here with her.    She does not exercise.    Medications and allergies reviewed with patient and updated if appropriate.  Patient Active Problem List   Diagnosis Date Noted   Aortic atherosclerosis ([place]) 01/08/2021   CKD (chronic kidney disease) stage 3, GFR 30-59 ml/min ([facility]) 01/08/2021   Fracture, humerus, greater tuberosity 07/10/2020   Closed tibia fracture 07/10/2020   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   Confusion 06/06/2018   Recurrent falls 06/06/2018   T12 compression fracture ([place]) 03/28/2018   Midline low back pain without sciatica 03/21/2018   Chronic lower back pain 09/19/2017   Pain due to total left knee replacement ([place]) 12/09/2016   Osteopenia 06/21/2016   Hypothyroidism 10/22/2015   Depression 10/22/2015   Anxiety 10/22/2015   GERD (gastroesophageal reflux disease) 10/22/2015   Insomnia 10/22/2015   Bilateral foot pain 10/22/2015   Bilateral knee pain 10/22/2015   HTN (hypertension) 01/20/2012   Hypercholesterolemia 01/20/2012    Current Outpatient Medications on File Prior to Visit  Medication Sig Dispense Refill   acetaminophen (TYLENOL) 325 MG tablet Take 650 mg by mouth every 6 (six) hours as needed for mild pain or moderate pain.      ALPRAZolam (XANAX) 0.5 MG tablet TAKE 1 TABLET BY MOUTH THREE TIMES DAILY AS NEEDED FOR ANXIETY . DO NOT EXCEED 3 PER 24 HOURS 90 tablet 0    amLODipine (NORVASC) 5 MG tablet Take 1 tablet by mouth once daily 90 tablet 0   calcium carbonate (OS-CAL - DOSED IN MG OF ELEMENTAL CALCIUM) 1250 (500 Ca) MG tablet Take 1 tablet (500 mg of elemental calcium total) by mouth daily.     cholecalciferol (VITAMIN D) 1000 UNITS tablet Take 1,000 Units by mouth daily.     cilostazol (PLETAL) 50 MG tablet Take 0.5 tablets (25 mg total) by mouth 2 (two) times daily. 60 tablet 5   EUTHYROX 112 MCG tablet Take 1 tablet by mouth once daily 90 tablet 0   famotidine (PEPCID) 40 MG tablet TAKE 1 TABLET BY MOUTH AT BEDTIME 90 tablet 0   ferrous sulfate 325 (65 FE) MG EC tablet Take 325 mg by mouth daily with breakfast.     fish oil-omega-3 fatty acids 1000 MG capsule Take 1 g by mouth 2 (two) times daily.      hydrochlorothiazide (HYDRODIURIL) 25 MG tablet Take 1 tablet by mouth once daily 90 tablet 0   levocetirizine (XYZAL) 5 MG tablet TAKE 1 TABLET BY MOUTH ONCE DAILY IN THE EVENING 90 tablet 0   Multiple Vitamin (MULTIVITAMIN) tablet Take 1 tablet by mouth daily.     omeprazole (PRILOSEC) 40 MG capsule TAKE 1 CAPSULE BY MOUTH TWICE DAILY BEFORE A MEAL 180 capsule 0   potassium chloride (KLOR-CON) 10 MEQ tablet Take 2 tablets by mouth once daily 60 tablet 0   QUEtiapine (SEROQUEL) 100 MG tablet TAKE 1 TABLET BY MOUTH AT BEDTIME  90 tablet 0   simvastatin (ZOCOR) 40 MG tablet Take 1 tablet by mouth in the evening 90 tablet 0   venlafaxine XR (EFFEXOR-XR) 150 MG 24 hr capsule TAKE 1 CAPSULE BY MOUTH ONCE DAILY WITH BREAKFAST 90 capsule 0   venlafaxine XR (EFFEXOR-XR) 75 MG 24 hr capsule TAKE 1 CAPSULE BY MOUTH ONCE DAILY WITH BREAKFAST FOR TOTAL OF 225 MG DAILY 90 capsule 0   No current facility-administered medications on file prior to visit.    Past Medical History:  Diagnosis Date   Anxiety    Cataract    Bil   Depression    Hypercholesteremia    Hypertension    Thyroid disease     Past Surgical History:  Procedure Laterality Date    CHOLECYSTECTOMY     COLONOSCOPY     FOOT SURGERY  2011   right foot   JOINT REPLACEMENT  2010   left knee   POLYPECTOMY     REPLACEMENT TOTAL KNEE Left    TONSILLECTOMY      Social History   Socioeconomic History   Marital status: Married    Spouse name: Not on file   Number of children: 3   Years of education: 12   Highest education level: Not on file  Occupational History   Occupation: Retired   Tobacco Use   Smoking status: Former    Types: Cigarettes    Quit date: 11/04/2009    Years since quitting: 11.8   Smokeless tobacco: Never  Vaping Use   Vaping Use: Never used  Substance and Sexual Activity   Alcohol use: No   Drug use: No   Sexual activity: Never  Other Topics Concern   Not on file  Social History Narrative   Cares for 106yo husband; has daughter locally who can help if needed   Social Determinants of Health   Financial Resource Strain: Not on file  Food Insecurity: Not on file  Transportation Needs: Not on file  Physical Activity: Not on file  Stress: Not on file  Social Connections: Not on file    Family History  Problem Relation Age of Onset   Heart disease Mother    Drug abuse Sister     Review of Systems  Constitutional:  Negative for chills and fever.  Respiratory:  Positive for shortness of breath (sometimes with anxiety). Negative for cough and wheezing.   Cardiovascular:  Negative for chest pain, palpitations and leg swelling.  Neurological:  Negative for light-headedness and headaches.      Objective:   Vitals:   09/01/21 1434  BP: 128/78  Pulse: 93  Temp: 98.4 F (36.9 C)  SpO2: 96%   BP Readings from Last 3 Encounters:  09/01/21 128/78  03/30/21 138/82  03/24/21 124/82   Wt Readings from Last 3 Encounters:  09/01/21 178 lb (80.7 kg)  03/30/21 186 lb 6.4 oz (84.6 kg)  03/24/21 184 lb (83.5 kg)   Body mass index is 31.53 kg/m.   Physical Exam    Constitutional: Appears well-developed and well-nourished. No  distress.  HENT:  Head: Normocephalic and atraumatic.  Neck: Neck supple. No tracheal deviation present. No thyromegaly present.  No cervical lymphadenopathy Cardiovascular: Normal rate, regular rhythm and normal heart sounds.   No murmur heard. No carotid bruit .  No edema Pulmonary/Chest: Effort normal and breath sounds normal. No respiratory distress. No has no wheezes. No rales.  Skin: Skin is warm and dry. Not diaphoretic.  Psychiatric: Normal mood and affect.  Behavior is normal.      Assessment & Plan:    See Problem List for Assessment and Plan of chronic medical problems.    FU in 6 months

## 2021-09-01 ENCOUNTER — Other Ambulatory Visit: Payer: Self-pay | Admitting: Internal Medicine

## 2021-09-01 ENCOUNTER — Ambulatory Visit (INDEPENDENT_AMBULATORY_CARE_PROVIDER_SITE_OTHER): Payer: Medicare Other | Admitting: Internal Medicine

## 2021-09-01 ENCOUNTER — Other Ambulatory Visit: Payer: Self-pay

## 2021-09-01 VITALS — BP 128/78 | HR 93 | Temp 98.4°F | Ht 63.0 in | Wt 178.0 lb

## 2021-09-01 DIAGNOSIS — E039 Hypothyroidism, unspecified: Secondary | ICD-10-CM

## 2021-09-01 DIAGNOSIS — N1831 Chronic kidney disease, stage 3a: Secondary | ICD-10-CM | POA: Diagnosis not present

## 2021-09-01 DIAGNOSIS — E7800 Pure hypercholesterolemia, unspecified: Secondary | ICD-10-CM | POA: Diagnosis not present

## 2021-09-01 DIAGNOSIS — F419 Anxiety disorder, unspecified: Secondary | ICD-10-CM | POA: Diagnosis not present

## 2021-09-01 DIAGNOSIS — I10 Essential (primary) hypertension: Secondary | ICD-10-CM

## 2021-09-01 DIAGNOSIS — M85839 Other specified disorders of bone density and structure, unspecified forearm: Secondary | ICD-10-CM

## 2021-09-01 DIAGNOSIS — K219 Gastro-esophageal reflux disease without esophagitis: Secondary | ICD-10-CM

## 2021-09-01 DIAGNOSIS — G4709 Other insomnia: Secondary | ICD-10-CM

## 2021-09-01 DIAGNOSIS — F3289 Other specified depressive episodes: Secondary | ICD-10-CM

## 2021-09-01 LAB — LIPID PANEL
Cholesterol: 172 mg/dL (ref 0–200)
HDL: 52.2 mg/dL (ref >39.00)
NonHDL: 119.75
Total CHOL/HDL Ratio: 3
Triglycerides: 205 mg/dL — ABNORMAL HIGH (ref 0.0–149.0)
VLDL: 41 mg/dL — ABNORMAL HIGH (ref 0.0–40.0)

## 2021-09-01 LAB — COMPREHENSIVE METABOLIC PANEL WITH GFR
ALT: 14 U/L (ref 0–35)
AST: 16 U/L (ref 0–37)
Albumin: 4.3 g/dL (ref 3.5–5.2)
Alkaline Phosphatase: 44 U/L (ref 39–117)
BUN: 17 mg/dL (ref 6–23)
CO2: 31 meq/L (ref 19–32)
Calcium: 9.5 mg/dL (ref 8.4–10.5)
Chloride: 102 meq/L (ref 96–112)
Creatinine, Ser: 1.06 mg/dL (ref 0.40–1.20)
GFR: 50.75 mL/min — ABNORMAL LOW
Glucose, Bld: 98 mg/dL (ref 70–99)
Potassium: 3.7 meq/L (ref 3.5–5.1)
Sodium: 141 meq/L (ref 135–145)
Total Bilirubin: 0.8 mg/dL (ref 0.2–1.2)
Total Protein: 6.8 g/dL (ref 6.0–8.3)

## 2021-09-01 LAB — TSH: TSH: 0.49 u[IU]/mL (ref 0.35–5.50)

## 2021-09-01 LAB — LDL CHOLESTEROL, DIRECT: Direct LDL: 86 mg/dL

## 2021-09-01 NOTE — Assessment & Plan Note (Signed)
Chronic cmp 

## 2021-09-01 NOTE — Assessment & Plan Note (Signed)
Chronic GERD controlled Continue pepcid 40 mg HS, omeprazole 40 mg bid

## 2021-09-01 NOTE — Assessment & Plan Note (Signed)
Chronic Check lipid panel  Continue pravastatin 40 mg daily Regular exercise and healthy diet encouraged  

## 2021-09-01 NOTE — Assessment & Plan Note (Signed)
Chronic Controlled, stable Continue seroquel 100 mg HS  

## 2021-09-01 NOTE — Assessment & Plan Note (Signed)
Chronic BP well controlled Continue amlodipine 5 mg qd, hctz 25 mg qd cmp

## 2021-09-01 NOTE — Assessment & Plan Note (Signed)
Chronic  Clinically euthyroid Currently taking levothyroxine 112 mcg daily Check tsh  Titrate med dose if needed

## 2021-09-01 NOTE — Assessment & Plan Note (Signed)
Chronic dexa due - ordered Taking calcium and vitamin d daily  stressed regular exercise

## 2021-09-01 NOTE — Assessment & Plan Note (Signed)
Chronic Fairly controlled - her anxiety per her is never controlled Continue effexor 225 mg daily, xanax 0.5 mg TID

## 2021-09-01 NOTE — Assessment & Plan Note (Signed)
Chronic Controlled, stable Continue effexor 225 mg daily  

## 2021-09-02 LAB — HEMOGLOBIN A1C: Hgb A1c MFr Bld: 5.5 % (ref 4.6–6.5)

## 2021-09-03 ENCOUNTER — Telehealth: Payer: Self-pay | Admitting: Internal Medicine

## 2021-09-03 NOTE — Telephone Encounter (Signed)
Patient requesting a call back to discuss lab results.

## 2021-09-03 NOTE — Telephone Encounter (Signed)
Her blood work shows  --   Sugars have been in the normal range for the past 3 months.  LDL or bad cholesterol is very good. your kidney function is decreased, but stable.  Your liver tests are normal.  Your thyroid function is in the normal range.

## 2021-09-04 NOTE — Telephone Encounter (Signed)
Message left today for patient with lab results.

## 2021-09-05 ENCOUNTER — Other Ambulatory Visit: Payer: Self-pay | Admitting: Internal Medicine

## 2021-09-08 ENCOUNTER — Other Ambulatory Visit: Payer: Self-pay | Admitting: Internal Medicine

## 2021-09-10 ENCOUNTER — Telehealth: Payer: Self-pay | Admitting: Internal Medicine

## 2021-09-10 NOTE — Telephone Encounter (Signed)
It sounds acute on chronic.  We should still r/o an infection first.  Then we can try a medication for an overactive bladder if she wants but we need to r/o an infection first.

## 2021-09-10 NOTE — Telephone Encounter (Signed)
Appointment made

## 2021-09-10 NOTE — Telephone Encounter (Signed)
Patient calling in  Patient says she has been having trouble controlling her urination more than normal  Offered patient OV to discuss w/ provider but she says she is currently unable to come in bc husband has been transported from hosp to rehab facility  Patient wants to know if provider can send rx to pharmacy for relief until she is able to come in for [place], [place]  Phone:  [phone] Fax:  [phone]

## 2021-09-13 NOTE — Progress Notes (Deleted)
Subjective:    Patient ID: Dawn Kim, female    DOB: [birth year], 75 y.o.   MRN: [id number]  This visit occurred during the SARS-CoV-2 public health emergency.  Safety protocols were in place, including screening questions prior to the visit, additional usage of staff PPE, and extensive cleaning of exam room while observing appropriate contact time as indicated for disinfecting solutions.    HPI The patient is here for an acute visit.  ? UTI:  Her symptoms started  *** days ago.  She states dysuria, urinary frequency, urinary urgency, hematuria, abdominal pain, back pain, nausea, fever.    Medications and allergies reviewed with patient and updated if appropriate.  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      ALPRAZolam (XANAX) 0.5 MG tablet TAKE 1 TABLET BY MOUTH THREE TIMES DAILY AS NEEDED FOR ANXIETY . DO NOT EXCEED 3 PER 24 HOURS 90 tablet 0   amLODipine  (NORVASC) 5 MG tablet Take 1 tablet by mouth once daily 90 tablet 0   calcium carbonate (OS-CAL - DOSED IN MG OF ELEMENTAL CALCIUM) 1250 (500 Ca) MG tablet Take 1 tablet (500 mg of elemental calcium total) by mouth daily.     cholecalciferol (VITAMIN D) 1000 UNITS tablet Take 1,000 Units by mouth daily.     cilostazol (PLETAL) 50 MG tablet Take 0.5 tablets (25 mg total) by mouth 2 (two) times daily. 60 tablet 5   famotidine (PEPCID) 40 MG tablet TAKE 1 TABLET BY MOUTH AT BEDTIME 90 tablet 0   ferrous sulfate 325 (65 FE) MG EC tablet Take 325 mg by mouth daily with breakfast.     fish oil-omega-3 fatty acids 1000 MG capsule Take 1 g by mouth 2 (two) times daily.      hydrochlorothiazide (HYDRODIURIL) 25 MG tablet Take 1 tablet by mouth once daily 90 tablet 0   levocetirizine (XYZAL) 5 MG tablet TAKE 1 TABLET BY MOUTH ONCE DAILY IN THE EVENING 90 tablet 0   levothyroxine (SYNTHROID) 112 MCG tablet Take 1 tablet by mouth once daily 90 tablet 0   Multiple Vitamin (MULTIVITAMIN) tablet Take 1 tablet by mouth daily.     omeprazole (PRILOSEC) 40 MG capsule TAKE 1 CAPSULE BY MOUTH TWICE DAILY BEFORE A MEAL 180 capsule 0   potassium chloride (KLOR-CON M) 10 MEQ tablet Take 2 tablets by mouth once daily 60 tablet 0   QUEtiapine (SEROQUEL) 100 MG tablet TAKE 1 TABLET BY MOUTH AT BEDTIME 90 tablet  0   simvastatin (ZOCOR) 40 MG tablet Take 1 tablet by mouth in the evening 90 tablet 0   venlafaxine XR (EFFEXOR-XR) 150 MG 24 hr capsule TAKE 1 CAPSULE BY MOUTH ONCE DAILY WITH BREAKFAST 90 capsule 0   venlafaxine XR (EFFEXOR-XR) 75 MG 24 hr capsule TAKE 1 CAPSULE BY MOUTH ONCE DAILY WITH BREAKFAST FOR TOTAL OF 225 MG DAILY 90 capsule 0   No current facility-administered medications on file prior to visit.    Past Medical History:  Diagnosis Date   Anxiety    Cataract    Bil   Depression    Hypercholesteremia    Hypertension    Thyroid disease     Past Surgical History:  Procedure Laterality Date    CHOLECYSTECTOMY     COLONOSCOPY     FOOT SURGERY  2011   right foot   JOINT REPLACEMENT  2010   left knee   POLYPECTOMY     REPLACEMENT TOTAL KNEE Left    TONSILLECTOMY      Social History   Socioeconomic History   Marital status: Married    Spouse name: Not on file   Number of children: 3   Years of education: 12   Highest education level: Not on file  Occupational History   Occupation: Retired   Tobacco Use   Smoking status: Former    Types: Cigarettes    Quit date: 11/04/2009    Years since quitting: 11.8   Smokeless tobacco: Never  Vaping Use   Vaping Use: Never used  Substance and Sexual Activity   Alcohol use: No   Drug use: No   Sexual activity: Never  Other Topics Concern   Not on file  Social History Narrative   Cares for 30yo husband; has daughter locally who can help if needed   Social Determinants of Health   Financial Resource Strain: Not on file  Food Insecurity: Not on file  Transportation Needs: Not on file  Physical Activity: Not on file  Stress: Not on file  Social Connections: Not on file    Family History  Problem Relation Age of Onset   Heart disease Mother    Drug abuse Sister     Review of Systems     Objective:  There were no vitals filed for this visit. BP Readings from Last 3 Encounters:  09/01/21 128/78  03/30/21 138/82  03/24/21 124/82   Wt Readings from Last 3 Encounters:  09/01/21 178 lb (80.7 kg)  03/30/21 186 lb 6.4 oz (84.6 kg)  03/24/21 184 lb (83.5 kg)   There is no height or weight on file to calculate BMI.   Physical Exam    Constitutional:      General: She is not in acute distress.    Appearance: Normal appearance. She is not ill-appearing.  HENT:     Head: Normocephalic and atraumatic.  Abdominal:     General: There is no distension.     Palpations: Abdomen is soft.     Tenderness: There is no abdominal tenderness. There is no right CVA tenderness, left CVA tenderness, guarding or rebound.  Skin:     General: Skin is warm and dry.  Neurological:     Mental Status: She is alert.       Assessment & Plan:    See Problem List for Assessment and Plan of chronic medical problems.

## 2021-09-14 ENCOUNTER — Ambulatory Visit: Payer: Medicare Other | Admitting: Internal Medicine

## 2021-09-17 ENCOUNTER — Other Ambulatory Visit: Payer: Self-pay | Admitting: Internal Medicine

## 2021-09-20 ENCOUNTER — Other Ambulatory Visit: Payer: Self-pay | Admitting: Internal Medicine

## 2021-09-21 ENCOUNTER — Other Ambulatory Visit: Payer: Self-pay | Admitting: Internal Medicine

## 2021-09-22 ENCOUNTER — Other Ambulatory Visit: Payer: Self-pay | Admitting: Internal Medicine

## 2021-09-28 ENCOUNTER — Other Ambulatory Visit: Payer: Self-pay | Admitting: Internal Medicine

## 2021-09-28 DIAGNOSIS — F419 Anxiety disorder, unspecified: Secondary | ICD-10-CM

## 2021-10-05 ENCOUNTER — Emergency Department (HOSPITAL_COMMUNITY): Payer: Medicare Other

## 2021-10-05 ENCOUNTER — Other Ambulatory Visit: Payer: Self-pay

## 2021-10-05 ENCOUNTER — Emergency Department (HOSPITAL_COMMUNITY)
Admission: EM | Admit: 2021-10-05 | Discharge: 2021-10-05 | Disposition: A | Discharge status: Home / Self Care | Payer: Medicare Other | Attending: Emergency Medicine | Admitting: Emergency Medicine

## 2021-10-05 DIAGNOSIS — R531 Weakness: Secondary | ICD-10-CM | POA: Diagnosis not present

## 2021-10-05 DIAGNOSIS — S43005D Unspecified dislocation of left shoulder joint, subsequent encounter: Secondary | ICD-10-CM | POA: Diagnosis not present

## 2021-10-05 DIAGNOSIS — S43015A Anterior dislocation of left humerus, initial encounter: Secondary | ICD-10-CM | POA: Diagnosis not present

## 2021-10-05 DIAGNOSIS — S43035A Inferior dislocation of left humerus, initial encounter: Secondary | ICD-10-CM | POA: Diagnosis not present

## 2021-10-05 DIAGNOSIS — Z743 Need for continuous supervision: Secondary | ICD-10-CM | POA: Diagnosis not present

## 2021-10-05 DIAGNOSIS — Y92009 Unspecified place in unspecified non-institutional (private) residence as the place of occurrence of the external cause: Secondary | ICD-10-CM | POA: Insufficient documentation

## 2021-10-05 DIAGNOSIS — S43005A Unspecified dislocation of left shoulder joint, initial encounter: Secondary | ICD-10-CM | POA: Diagnosis not present

## 2021-10-05 DIAGNOSIS — S4992XA Unspecified injury of left shoulder and upper arm, initial encounter: Secondary | ICD-10-CM | POA: Diagnosis present

## 2021-10-05 DIAGNOSIS — W19XXXA Unspecified fall, initial encounter: Secondary | ICD-10-CM | POA: Diagnosis not present

## 2021-10-05 MED ORDER — PROPOFOL 10 MG/ML IV BOLUS
1.0000 mg/kg | Freq: Once | INTRAVENOUS | Status: DC
  Filled 2021-10-05: qty 20

## 2021-10-05 MED ORDER — FENTANYL CITRATE PF 50 MCG/ML IJ SOSY
100.0000 ug | PREFILLED_SYRINGE | Freq: Once | INTRAMUSCULAR | Status: AC
Start: 2021-10-05 — End: 2021-10-05
  Administered 2021-10-05: 100 ug via INTRAVENOUS
  Filled 2021-10-05: qty 2

## 2021-10-05 MED ORDER — LACTATED RINGERS IV BOLUS
1000.0000 mL | Freq: Once | INTRAVENOUS | Status: AC
  Administered 2021-10-05: 1000 mL via INTRAVENOUS

## 2021-10-05 MED ORDER — ONDANSETRON HCL 4 MG/2ML IJ SOLN
4.0000 mg | Freq: Once | INTRAMUSCULAR | Status: AC
Start: 2021-10-05 — End: 2021-10-05
  Administered 2021-10-05: 4 mg via INTRAVENOUS
  Filled 2021-10-05: qty 2

## 2021-10-05 MED ORDER — KETOROLAC TROMETHAMINE 15 MG/ML IJ SOLN
15.0000 mg | Freq: Once | INTRAMUSCULAR | Status: AC
  Administered 2021-10-05: 15 mg via INTRAVENOUS
  Filled 2021-10-05: qty 1

## 2021-10-05 MED ORDER — PROPOFOL 10 MG/ML IV BOLUS
INTRAVENOUS | Status: AC | PRN
Start: 2021-10-05 — End: 2021-10-05
  Administered 2021-10-05: 6 mg via INTRAVENOUS

## 2021-10-05 NOTE — ED Notes (Signed)
RT & Ortho tech called for conscious sedation.

## 2021-10-05 NOTE — ED Notes (Signed)
Patient transported to X-ray 

## 2021-10-05 NOTE — ED Notes (Signed)
Pt's niece & daughter are not able to come get pt at this time, This RN is trying to help find pt a ride home.

## 2021-10-05 NOTE — Progress Notes (Signed)
Orthopedic Tech Progress Note Patient Details:  Dawn Kim 1946/01/29 [id number]  Ortho Devices Type of Ortho Device: Shoulder immobilizer Ortho Device/Splint Location: LUE Ortho Device/Splint Interventions: Ordered, Application, Adjustment   Post Interventions Patient Tolerated: Well Instructions Provided: Care of device  [name] 10/05/2021, 12:39 PM

## 2021-10-05 NOTE — ED Notes (Signed)
Pt drank some water & ambulated to the restroom. Education was enforced to keep her Left arm in the shoulder sling.

## 2021-10-05 NOTE — ED Notes (Signed)
Social work is going to call pt an Surveyor, mining after case management comes to give her shoes & a coat.

## 2021-10-05 NOTE — ED Triage Notes (Signed)
Pt fell in her living room d/t mechanical fall, injury to her Left shoulder. Deformity noted upon arrival, pain rated 11/10, A/Ox4.

## 2021-10-05 NOTE — ED Notes (Signed)
Pt's niece called & offered any information if needed & left her number [phone].

## 2021-10-05 NOTE — ED Provider Notes (Signed)
[facility] EMERGENCY DEPARTMENT Provider Note   CSN: [id number] Arrival date & time: 10/05/21  1003     History  Chief Complaint  Patient presents with   Lytle Michaels    Dawn Kim is a 76 y.o. female.  HPI Patient presents from home, by EMS, after a fall.  Fall occurred less than 1 hour prior to arrival.  She experienced severe pain in her left shoulder.  There is concern of deformity by EMS.  No pain medications were given prior to arrival.  Currently, patient endorses continued severe pain in the area of her left shoulder.  She denies any other areas of discomfort.  Patient reports that she has had issues with balance and falls due to her ongoing neuropathy.  She does remember the entire fall.  She states that her left shoulder struck the edge of a doorway.  She denies hitting her head.  She denies loss of consciousness.  Patient usually lives at home with her husband.  Currently, her husband is here in the hospital, critically ill.  Prior to her fall, she has been in her normal state of health.    Home Medications Prior to Admission medications   Medication Sig Start Date End Date Taking? Authorizing Provider  ALPRAZolam (XANAX) 0.5 MG tablet TAKE 1 TABLET BY MOUTH THREE TIMES DAILY AS NEEDED FOR ANXIETY . DO NOT EXCEED 3 PER 24 HOURS 09/29/21   Janith Lima, MD  acetaminophen (TYLENOL) 325 MG tablet Take 650 mg by mouth every 6 (six) hours as needed for mild pain or moderate pain.     [provider]  amLODipine (NORVASC) 5 MG tablet Take 1 tablet by mouth once daily 05/21/21   Binnie Rail, MD  calcium carbonate (OS-CAL - DOSED IN MG OF ELEMENTAL CALCIUM) 1250 (500 Ca) MG tablet Take 1 tablet (500 mg of elemental calcium total) by mouth daily. 05/31/16   Nche, Charlene Brooke, NP  cholecalciferol (VITAMIN D) 1000 UNITS tablet Take 1,000 Units by mouth daily.    [provider]  cilostazol (PLETAL) 50 MG tablet Take 0.5 tablets (25 mg total) by mouth  2 (two) times daily. 02/20/21   Binnie Rail, MD  famotidine (PEPCID) 40 MG tablet TAKE 1 TABLET BY MOUTH AT BEDTIME 09/07/21   Binnie Rail, MD  ferrous sulfate 325 (65 FE) MG EC tablet Take 325 mg by mouth daily with breakfast.    [provider]  fish oil-omega-3 fatty acids 1000 MG capsule Take 1 g by mouth 2 (two) times daily.     [provider]  hydrochlorothiazide (HYDRODIURIL) 25 MG tablet Take 1 tablet by mouth once daily 09/07/21   Binnie Rail, MD  levocetirizine (XYZAL) 5 MG tablet TAKE 1 TABLET BY MOUTH ONCE DAILY IN THE EVENING 03/27/21   Binnie Rail, MD  levothyroxine (SYNTHROID) 112 MCG tablet Take 1 tablet by mouth once daily 09/07/21   Binnie Rail, MD  Multiple Vitamin (MULTIVITAMIN) tablet Take 1 tablet by mouth daily.    [provider]  omeprazole (PRILOSEC) 40 MG capsule TAKE 1 CAPSULE BY MOUTH TWICE DAILY BEFORE A MEAL 09/07/21   Burns, Claudina Lick, MD  potassium chloride (KLOR-CON M) 10 MEQ tablet Take 2 tablets by mouth once daily 09/07/21   Binnie Rail, MD  QUEtiapine (SEROQUEL) 100 MG tablet TAKE 1 TABLET BY MOUTH AT BEDTIME 09/07/21   Binnie Rail, MD  simvastatin (ZOCOR) 40 MG tablet Take 1  tablet by mouth in the evening 09/07/21   Binnie Rail, MD  venlafaxine XR (EFFEXOR-XR) 150 MG 24 hr capsule TAKE 1 CAPSULE BY MOUTH ONCE DAILY WITH BREAKFAST 09/07/21   Binnie Rail, MD  venlafaxine XR (EFFEXOR-XR) 75 MG 24 hr capsule TAKE 1 CAPSULE BY MOUTH ONCE DAILY WITH BREAKFAST FOR TOTAL OF 225 MG DAILY 09/07/21   Binnie Rail, MD      Allergies    Gabapentin, Lunesta [eszopiclone], Penicillins, Sonata [zaleplon], Ambien [zolpidem], and Amoxicillin    Review of Systems   Review of Systems  Constitutional:  Negative for chills and fever.  HENT:  Negative for ear pain and sore throat.   Eyes:  Negative for pain and visual disturbance.  Respiratory:  Negative for cough and shortness of breath.   Cardiovascular:  Negative for chest pain  and palpitations.  Gastrointestinal:  Negative for abdominal pain, diarrhea, nausea and vomiting.  Genitourinary:  Negative for dysuria, flank pain, hematuria and pelvic pain.  Musculoskeletal:  Positive for arthralgias and joint swelling. Negative for back pain and neck pain.  Skin:  Negative for color change, rash and wound.  Neurological:  Negative for dizziness, seizures, syncope, speech difficulty, weakness, light-headedness, numbness and headaches.  Hematological:  Does not bruise/bleed easily.  Psychiatric/Behavioral:  Negative for confusion and decreased concentration.   All other systems reviewed and are negative.  Physical Exam Updated Vital Signs BP 118/66    Pulse 66    Temp 98.3 F (36.8 C)    Resp 15    SpO2 100%  Physical Exam Vitals and nursing note reviewed.  Constitutional:      General: She is not in acute distress.    Appearance: Normal appearance. She is well-developed and normal weight. She is not toxic-appearing or diaphoretic.  HENT:     Head: Normocephalic and atraumatic.     Right Ear: External ear normal.     Left Ear: External ear normal.     Nose: Nose normal.     Mouth/Throat:     Mouth: Mucous membranes are moist.     Pharynx: Oropharynx is clear.  Eyes:     General: No scleral icterus.    Extraocular Movements: Extraocular movements intact.     Conjunctiva/sclera: Conjunctivae normal.  Cardiovascular:     Rate and Rhythm: Normal rate and regular rhythm.     Heart sounds: No murmur heard. Pulmonary:     Effort: Pulmonary effort is normal. No respiratory distress.     Breath sounds: Normal breath sounds. No wheezing or rales.  Chest:     Chest wall: No tenderness.  Abdominal:     General: Abdomen is flat.     Palpations: Abdomen is soft.     Tenderness: There is no abdominal tenderness.  Musculoskeletal:        General: Tenderness, deformity and signs of injury present.     Cervical back: Normal range of motion and neck supple. No rigidity or  tenderness.  Skin:    General: Skin is warm and dry.     Capillary Refill: Capillary refill takes less than 2 seconds.     Coloration: Skin is not jaundiced or pale.  Neurological:     General: No focal deficit present.     Mental Status: She is alert and oriented to person, place, and time.  Psychiatric:        Mood and Affect: Mood normal.        Behavior: Behavior normal.  ED Results / Procedures / Treatments   Labs (all labs ordered are listed, but only abnormal results are displayed) Labs Reviewed - No data to display  EKG None  Radiology DG Shoulder Left  Result Date: 10/05/2021 CLINICAL DATA:  Fall today with left shoulder pain. EXAM: LEFT SHOULDER - 2+ VIEW COMPARISON:  06/18/2020 FINDINGS: Humeral head has dislocated, abutting the anterior inferior margin of the glenoid. There is a chronic appearing fracture fragment that lies lateral to the superior glenoid. No evidence of an acute fracture. AC joint is normally aligned. IMPRESSION: 1. Anterior-inferior dislocation of the left humeral head. No acute fracture. Electronically Signed   By: Lajean Manes M.D.   On: 10/05/2021 10:34   DG Shoulder Left Portable  Result Date: 10/05/2021 CLINICAL DATA:  76 year old female with history of left shoulder dislocation status post reduction. EXAM: LEFT SHOULDER COMPARISON:  10/05/2021. FINDINGS: Humeral head now projects over the glenoid fossa on this single view examination, suggesting proper relocation of the shoulder following recent dislocation. Known chronic displaced fracture fragment from prior Hill-Sachs fracture is now obscured. IMPRESSION: 1. Status post left shoulder reduction, as above. Electronically Signed   By: Vinnie Langton M.D.   On: 10/05/2021 12:12    Procedures .Sedation  Date/Time: 10/05/2021 12:00 PM Performed by: Godfrey Pick, MD Authorized by: Godfrey Pick, MD   Consent:    Consent obtained:  Verbal and written   Consent given by:  Patient   Risks discussed:   Allergic reaction, prolonged hypoxia resulting in organ damage, inadequate sedation, respiratory compromise necessitating ventilatory assistance and intubation, nausea, vomiting and dysrhythmia   Alternatives discussed:  Analgesia without sedation and anxiolysis Universal protocol:    Immediately prior to procedure, a time out was called: yes     Patient identity confirmed:  Arm band and verbally with patient Indications:    Procedure performed:  Dislocation reduction   Procedure necessitating sedation performed by:  Physician performing sedation Pre-sedation assessment:    Time since last food or drink:  12 hours   ASA classification: class 2 - patient with mild systemic disease     Mouth opening:  2 finger widths   Thyromental distance:  3 finger widths   Mallampati score:  IV - only hard palate visible   Neck mobility: normal     Pre-sedation assessments completed and reviewed: airway patency, cardiovascular function, hydration status, mental status, nausea/vomiting, pain level and respiratory function     Pre-sedation assessment completed:  10/05/2021 11:30 AM Immediate pre-procedure details:    Reassessment: Patient reassessed immediately prior to procedure     Reviewed: vital signs     Verified: bag valve mask available, emergency equipment available, intubation equipment available, IV patency confirmed and oxygen available   Procedure details (see MAR for exact dosages):    Preoxygenation:  Room air   Sedation:  Propofol   Intended level of sedation: moderate (conscious sedation)   Analgesia:  Fentanyl   Intra-procedure monitoring:  Blood pressure monitoring, continuous capnometry, cardiac monitor, continuous pulse oximetry, frequent LOC assessments and frequent vital sign checks   Intra-procedure events: respiratory depression     Intra-procedure management:  BVM ventilation   Total Provider sedation time (minutes):  15 Post-procedure details:    Post-sedation assessment  completed:  10/05/2021 12:00 PM   Attendance: Constant attendance by certified staff until patient recovered     Recovery: Patient returned to pre-procedure baseline     Post-sedation assessments completed and reviewed: airway patency, cardiovascular function, hydration status,  mental status, nausea/vomiting, pain level and respiratory function     Patient is stable for discharge or admission: yes     Procedure completion:  Tolerated well, no immediate complications .Ortho Injury Treatment  Date/Time: 10/05/2021 12:00 PM Performed by: Godfrey Pick, MD Authorized by: Godfrey Pick, MD   Consent:    Consent obtained:  Verbal and written   Consent given by:  Patient   Risks discussed:  Fracture, nerve damage, restricted joint movement, irreducible dislocation and vascular damage   Alternatives discussed:  No treatment and delayed treatmentInjury location: shoulder Location details: left shoulder Injury type: dislocation Dislocation type: anterior Hill-Sachs deformity: no Chronicity: new Pre-procedure neurovascular assessment: neurovascularly intact Pre-procedure distal perfusion: normal Pre-procedure neurological function: normal Pre-procedure range of motion: reduced  Anesthesia: Local anesthesia used: no  Patient sedated: Yes. Refer to sedation procedure documentation for details of sedation. Manipulation performed: yes Reduction method: traction and counter traction Reduction successful: yes X-ray confirmed reduction: yes Immobilization: sling Splint Applied by: ED Provider and Ortho Tech Post-procedure neurovascular assessment: post-procedure neurovascularly intact Post-procedure distal perfusion: normal Post-procedure neurological function: normal Post-procedure range of motion: improved      Medications Ordered in ED Medications  fentaNYL (SUBLIMAZE) injection 100 mcg (100 mcg Intravenous Given 10/05/21 1017)  fentaNYL (SUBLIMAZE) injection 100 mcg (100 mcg Intravenous Given  10/05/21 1056)  ondansetron (ZOFRAN) injection 4 mg (4 mg Intravenous Given 10/05/21 1110)  lactated ringers bolus 1,000 mL (0 mLs Intravenous Stopped 10/05/21 1401)  propofol (DIPRIVAN) 10 mg/mL bolus/IV push (6 mg Intravenous Given 10/05/21 1147)  ketorolac (TORADOL) 15 MG/ML injection 15 mg (15 mg Intravenous Given 10/05/21 1236)    ED Course/ Medical Decision Making/ A&P                           Medical Decision Making  Patient is a 76 year old female who presents after a mechanical fall at home.  During this fall, she struck her left shoulder against the edge of a door.  This caused severe pain and deformity to her left shoulder.  No analgesia was given prior to arrival in the ED.  On arrival, patient is in severe pain.  100 mcg of fentanyl was ordered.  Patient underwent x-ray imaging of her left shoulder which did show an anterior dislocation.  On reassessment, patient continues to be in severe pain.  Additional 100 mcg of fentanyl was given.  Patient was informed of x-ray imaging results.  She was agreeable to plan for sedation and attempted bedside reduction.  Allergies n.p.o. status was reviewed.  Patient underwent sedation and reduction of dislocated shoulder, as per procedure notes above.  During sedation, she did have respiratory depression.  This episode was brief.  During this brief time, respirations were supported with BVM.  Successful reduction was confirmed with repeat x-ray.  Patient fully recovered from sedation and did endorse dramatic improvement in her shoulder pain.  Toradol was given for continued analgesia.  She was able to ambulate and tolerate p.o. intake.  She does feel comfortable with discharge home.  Patient was advised to follow-up with orthopedic surgery in approximately 1 week.  She was discharged in good condition.        Final Clinical Impression(s) / ED Diagnoses Final diagnoses:  Anterior dislocation of left shoulder, initial encounter    Rx / DC Orders ED  Discharge Orders     None         Godfrey Pick, MD 10/06/21 1911

## 2021-10-06 ENCOUNTER — Other Ambulatory Visit: Payer: Self-pay | Admitting: Internal Medicine

## 2021-10-12 ENCOUNTER — Inpatient Hospital Stay: Admission: RE | Admit: 2021-10-12 | Payer: Medicare Other | Source: Ambulatory Visit

## 2021-10-26 ENCOUNTER — Other Ambulatory Visit: Payer: Self-pay | Admitting: Internal Medicine

## 2021-10-27 ENCOUNTER — Other Ambulatory Visit: Payer: Self-pay | Admitting: Internal Medicine

## 2021-10-27 DIAGNOSIS — F419 Anxiety disorder, unspecified: Secondary | ICD-10-CM

## 2021-10-28 ENCOUNTER — Telehealth: Payer: Self-pay | Admitting: Internal Medicine

## 2021-10-28 DIAGNOSIS — F419 Anxiety disorder, unspecified: Secondary | ICD-10-CM

## 2021-10-28 MED ORDER — ALPRAZOLAM 0.5 MG PO TABS: ORAL_TABLET | ORAL | 0 refills | Status: DC

## 2021-10-28 NOTE — Telephone Encounter (Signed)
Pt has called and is wanting to know why her ALPRAZolam (XANAX) 0.5 MG tablet is not covered under her insurance. States her husband passed away, and she needs to discuss some things with her. Appt scheduled.

## 2021-11-02 NOTE — Progress Notes (Deleted)
Subjective:    Patient ID: Dawn Kim, female    DOB: 08-22-1946, 76 y.o.   MRN: [id number]  This visit occurred during the SARS-CoV-2 public health emergency.  Safety protocols were in place, including screening questions prior to the visit, additional usage of staff PPE, and extensive cleaning of exam room while observing appropriate contact time as indicated for disinfecting solutions.     HPI The patient is here for follow up of their chronic medical problems, including htn, hld, hypothyroidism, gerd, insomnia, anxiety, depression, CKD  Skip labs  She is taking all of her medications as prescribed.     Medications and allergies reviewed with patient and updated if appropriate.  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      ALPRAZolam (XANAX) 0.5 MG tablet TAKE 1 TABLET BY MOUTH THREE TIMES DAILY AS NEEDED FOR ANXIETY . DO NOT EXCEED 3 PER 24 HOURS 90 tablet 0    amLODipine (NORVASC) 5 MG tablet Take 1 tablet by mouth once daily 90 tablet 0   calcium carbonate (OS-CAL - DOSED IN MG OF ELEMENTAL CALCIUM) 1250 (500 Ca) MG tablet Take 1 tablet (500 mg of elemental calcium total) by mouth daily.     cholecalciferol (VITAMIN D) 1000 UNITS tablet Take 1,000 Units by mouth daily.     cilostazol (PLETAL) 50 MG tablet Take 0.5 tablets (25 mg total) by mouth 2 (two) times daily. 60 tablet 5   famotidine (PEPCID) 40 MG tablet TAKE 1 TABLET BY MOUTH AT BEDTIME 90 tablet 0   ferrous sulfate 325 (65 FE) MG EC tablet Take 325 mg by mouth daily with breakfast.     fish oil-omega-3 fatty acids 1000 MG capsule Take 1 g by mouth 2 (two) times daily.      hydrochlorothiazide (HYDRODIURIL) 25 MG tablet Take 1 tablet by mouth once daily 90 tablet 0   levocetirizine (XYZAL) 5 MG tablet TAKE 1 TABLET BY MOUTH ONCE DAILY IN THE EVENING 90 tablet 0   levothyroxine (SYNTHROID) 112 MCG tablet Take 1 tablet by mouth once daily 90 tablet 0   Multiple Vitamin (MULTIVITAMIN) tablet Take 1 tablet by mouth daily.     omeprazole (PRILOSEC) 40 MG capsule TAKE 1 CAPSULE BY MOUTH TWICE DAILY BEFORE A MEAL 180 capsule 0   potassium chloride (KLOR-CON M) 10 MEQ tablet Take 2 tablets by mouth once daily 60 tablet 0   QUEtiapine (SEROQUEL) 100 MG tablet TAKE 1 TABLET BY MOUTH  AT BEDTIME 90 tablet 0   simvastatin (ZOCOR) 40 MG tablet Take 1 tablet by mouth in the evening 90 tablet 0   venlafaxine XR (EFFEXOR-XR) 150 MG 24 hr capsule TAKE 1 CAPSULE BY MOUTH ONCE DAILY WITH BREAKFAST 90 capsule 0   venlafaxine XR (EFFEXOR-XR) 75 MG 24 hr capsule TAKE 1 CAPSULE BY MOUTH ONCE DAILY WITH BREAKFAST FOR TOTAL OF 225 MG DAILY 90 capsule 0   No current facility-administered medications on file prior to visit.    Past Medical History:  Diagnosis Date   Anxiety    Cataract    Bil   Depression    Hypercholesteremia    Hypertension    Thyroid disease     Past Surgical History:  Procedure  Laterality Date   CHOLECYSTECTOMY     COLONOSCOPY     FOOT SURGERY  2011   right foot   JOINT REPLACEMENT  2010   left knee   POLYPECTOMY     REPLACEMENT TOTAL KNEE Left    TONSILLECTOMY      Social History   Socioeconomic History   Marital status: Married    Spouse name: Not on file   Number of children: 3   Years of education: 12   Highest education level: Not on file  Occupational History   Occupation: Retired   Tobacco Use   Smoking status: Former    Types: Cigarettes    Quit date: 11/04/2009    Years since quitting: 12.0   Smokeless tobacco: Never  Vaping Use   Vaping Use: Never used  Substance and Sexual Activity   Alcohol use: No   Drug use: No   Sexual activity: Never  Other Topics Concern   Not on file  Social History Narrative   Cares for 61yo husband; has daughter locally who can help if needed   Social Determinants of Health   Financial Resource Strain: Not on file  Food Insecurity: Not on file  Transportation Needs: Not on file  Physical Activity: Not on file  Stress: Not on file  Social Connections: Not on file    Family History  Problem Relation Age of Onset   Heart disease Mother    Drug abuse Sister     Review of Systems     Objective:  There were no vitals filed for this visit. BP Readings from Last 3 Encounters:  10/05/21 118/66  09/01/21 128/78  03/30/21 138/82   Wt Readings from Last 3 Encounters:  09/01/21 178 lb (80.7 kg)  03/30/21 186 lb 6.4 oz (84.6 kg)  03/24/21 184 lb (83.5 kg)   There is no height or weight on file to calculate BMI.   Physical Exam    Constitutional: Appears well-developed and well-nourished. No distress.  HENT:  Head: Normocephalic and atraumatic.  Neck: Neck supple. No tracheal deviation present. No thyromegaly present.  No cervical lymphadenopathy Cardiovascular: Normal rate, regular rhythm and normal heart sounds.   No murmur heard. No carotid bruit .  No edema Pulmonary/Chest: Effort normal  and breath sounds normal. No respiratory distress. No has no wheezes. No rales.  Skin: Skin is warm and dry. Not diaphoretic.  Psychiatric: Normal mood and affect. Behavior is normal.      Assessment & Plan:    See Problem List for Assessment and Plan of chronic medical problems.

## 2021-11-03 ENCOUNTER — Ambulatory Visit: Payer: Medicare Other | Admitting: Internal Medicine

## 2021-11-03 DIAGNOSIS — G4709 Other insomnia: Secondary | ICD-10-CM

## 2021-11-03 DIAGNOSIS — F419 Anxiety disorder, unspecified: Secondary | ICD-10-CM

## 2021-11-03 DIAGNOSIS — F3289 Other specified depressive episodes: Secondary | ICD-10-CM

## 2021-11-03 DIAGNOSIS — E039 Hypothyroidism, unspecified: Secondary | ICD-10-CM

## 2021-11-03 DIAGNOSIS — E7800 Pure hypercholesterolemia, unspecified: Secondary | ICD-10-CM

## 2021-11-03 DIAGNOSIS — I10 Essential (primary) hypertension: Secondary | ICD-10-CM

## 2021-11-03 DIAGNOSIS — N1831 Chronic kidney disease, stage 3a: Secondary | ICD-10-CM

## 2021-11-03 DIAGNOSIS — K219 Gastro-esophageal reflux disease without esophagitis: Secondary | ICD-10-CM

## 2021-11-06 ENCOUNTER — Other Ambulatory Visit: Payer: Self-pay | Admitting: Internal Medicine

## 2021-11-06 NOTE — Telephone Encounter (Signed)
Patient calling in  Patient says she has had hard time sleeping since husband passed away last month.. has not slept in about 4 days  Wants to know if provider can send in sleep med to [facility], [place] Phone:  [phone]  Fax:  [phone]

## 2021-11-08 NOTE — Telephone Encounter (Signed)
We did discuss this at her last visit - she has tried 4 other sleep meds that she did not tolerate or did not work.     We can try belsomra - if covered  - try one pill nightly - if that does not work she can try 2 tabs or 20 mg at night.  She will need to stop the seroquel when she take this.      Medication pending - send in please

## 2021-11-09 ENCOUNTER — Telehealth: Payer: Self-pay

## 2021-11-09 ENCOUNTER — Other Ambulatory Visit: Payer: Self-pay

## 2021-11-09 DIAGNOSIS — G4709 Other insomnia: Secondary | ICD-10-CM

## 2021-11-09 MED ORDER — AMLODIPINE BESYLATE 5 MG PO TABS: 5.0000 mg | ORAL_TABLET | Freq: Every day | ORAL | 1 refills | Status: DC

## 2021-11-09 MED ORDER — BELSOMRA 10 MG PO TABS: 10.0000 mg | ORAL_TABLET | Freq: Every evening | ORAL | 0 refills | Status: DC | PRN

## 2021-11-09 NOTE — Telephone Encounter (Signed)
Pt is requesting the generic to Belsomra to be sent in.

## 2021-11-09 NOTE — Addendum Note (Signed)
Addended by: Binnie Rail on: 11/09/2021 03:01 PM   Modules accepted: Orders

## 2021-11-09 NOTE — Telephone Encounter (Signed)
Message sent to Dr. Quay Burow to and she will need to send in script.

## 2021-11-11 NOTE — Telephone Encounter (Signed)
Because of her current medications and the medications she has tried in the past and did not tolerate there are really no other options for her sleep.  I can refer her to a psychiatrist for further medication management if she wishes.

## 2021-11-11 NOTE — Addendum Note (Signed)
Addended by: Binnie Rail on: 11/11/2021 12:25 PM   Modules accepted: Orders

## 2021-11-11 NOTE — Telephone Encounter (Signed)
Patient would like to hold off on a referral at this current time and said she will try to manage on her own. She has been advised to call us back if she changes her mind.

## 2021-11-11 NOTE — Telephone Encounter (Signed)
Pt states Belsomra is too expensive, pt requesting an alternative generic brand sent to pharmacy on record

## 2021-11-19 ENCOUNTER — Other Ambulatory Visit: Payer: Self-pay | Admitting: Internal Medicine

## 2021-11-19 DIAGNOSIS — F419 Anxiety disorder, unspecified: Secondary | ICD-10-CM

## 2021-12-04 ENCOUNTER — Other Ambulatory Visit: Payer: Self-pay | Admitting: Internal Medicine

## 2021-12-04 DIAGNOSIS — F419 Anxiety disorder, unspecified: Secondary | ICD-10-CM

## 2022-01-05 ENCOUNTER — Telehealth: Payer: Self-pay

## 2022-01-05 NOTE — Telephone Encounter (Signed)
Handicap sticker complete and mailed out to patient today. Patient notified. ?

## 2022-01-08 ENCOUNTER — Other Ambulatory Visit: Payer: Self-pay | Admitting: Internal Medicine

## 2022-01-15 ENCOUNTER — Other Ambulatory Visit: Payer: Self-pay | Admitting: Internal Medicine

## 2022-01-18 ENCOUNTER — Other Ambulatory Visit: Payer: Self-pay | Admitting: Internal Medicine

## 2022-01-18 DIAGNOSIS — F419 Anxiety disorder, unspecified: Secondary | ICD-10-CM

## 2022-01-23 ENCOUNTER — Other Ambulatory Visit: Payer: Self-pay | Admitting: Internal Medicine

## 2022-01-23 DIAGNOSIS — F419 Anxiety disorder, unspecified: Secondary | ICD-10-CM

## 2022-02-15 ENCOUNTER — Other Ambulatory Visit: Payer: Self-pay | Admitting: Internal Medicine

## 2022-02-15 DIAGNOSIS — F419 Anxiety disorder, unspecified: Secondary | ICD-10-CM

## 2022-02-27 ENCOUNTER — Other Ambulatory Visit: Payer: Self-pay | Admitting: Internal Medicine

## 2022-02-28 NOTE — Progress Notes (Unsigned)
Subjective:    Patient ID: Dawn Kim, female    DOB: [birth year], 76 y.o.   MRN: [id number]     HPI Dawn Kim is here for follow up of her chronic medical problems, including htn, hld, hypothyroidism, gerd, insomnia, anxiety, depression, CKD  Her husband died in 10/27/2022.  Overall she is doing well.  Her and her husband had a difficult relationship so to some degree she feels better.   Legs swelling x 3 weeks.  Some SOB with exertion and even with talking.  She is eating frozen meals more since her husband passed away.     Medications and allergies reviewed with patient and updated if appropriate.  Current Outpatient Medications on File Prior to Visit  Medication Sig Dispense Refill   acetaminophen (TYLENOL) 325 MG tablet Take 650 mg by mouth every 6 (six) hours as needed for mild pain or moderate pain.      ALPRAZolam (XANAX) 0.5 MG tablet TAKE 1 TABLET BY MOUTH THREE TIMES DAILY AS NEEDED FOR ANXIETY . DO NOT EXCEED 3 PER 24 HOURS 90 tablet 0   amLODipine (NORVASC) 5 MG tablet Take 1 tablet (5 mg total) by mouth daily. 90 tablet 1   calcium carbonate (OS-CAL - DOSED IN MG OF ELEMENTAL CALCIUM) 1250 (500 Ca) MG tablet Take 1 tablet (500 mg of elemental calcium total) by mouth daily.     cholecalciferol (VITAMIN D) 1000 UNITS tablet Take 1,000 Units by mouth daily.     cilostazol (PLETAL) 50 MG tablet Take 0.5 tablets (25 mg total) by mouth 2 (two) times daily. 60 tablet 5   famotidine (PEPCID) 40 MG tablet TAKE 1 TABLET BY MOUTH AT BEDTIME 90 tablet 0   ferrous sulfate 325 (65 FE) MG EC tablet Take 325 mg by mouth daily with breakfast.     fish oil-omega-3 fatty acids 1000 MG capsule Take 1 g by mouth 2 (two) times daily.      hydrochlorothiazide (HYDRODIURIL) 25 MG tablet Take 1 tablet by mouth once daily 90 tablet 0   levocetirizine (XYZAL) 5 MG tablet TAKE 1 TABLET BY MOUTH ONCE DAILY IN THE EVENING 90 tablet 0   levothyroxine (SYNTHROID) 112 MCG tablet Take 1 tablet by  mouth once daily 90 tablet 0   Multiple Vitamin (MULTIVITAMIN) tablet Take 1 tablet by mouth daily.     omeprazole (PRILOSEC) 40 MG capsule TAKE 1 CAPSULE BY MOUTH TWICE DAILY BEFORE A MEAL 180 capsule 0   potassium chloride (KLOR-CON M) 10 MEQ tablet Take 2 tablets by mouth once daily 60 tablet 0   QUEtiapine (SEROQUEL) 100 MG tablet TAKE 1 TABLET BY MOUTH AT BEDTIME 90 tablet 0   simvastatin (ZOCOR) 40 MG tablet Take 1 tablet by mouth in the evening 90 tablet 0   venlafaxine XR (EFFEXOR-XR) 150 MG 24 hr capsule Take 1 capsule (150 mg total) by mouth daily with breakfast. Keep May appt for future refills 90 capsule 0   venlafaxine XR (EFFEXOR-XR) 75 MG 24 hr capsule TAKE 1 CAPSULE BY MOUTH ONCE DAILY WITH BREAKFAST FOR OF 225 MG DAILY 90 capsule 0   No current facility-administered medications on file prior to visit.     Review of Systems  Constitutional:  Negative for chills and fever.  Respiratory:  Positive for shortness of breath (with exertion). Negative for cough and wheezing.   Cardiovascular:  Positive for leg swelling. Negative for chest pain and palpitations.  Neurological:  Positive for dizziness. Negative for  light-headedness and headaches.      Objective:   Vitals:   03/02/22 1458  BP: 132/80  Pulse: 78  Temp: 98 F (36.7 C)  SpO2: 96%   BP Readings from Last 3 Encounters:  03/02/22 132/80  10/05/21 118/66  09/01/21 128/78   Wt Readings from Last 3 Encounters:  03/02/22 179 lb (81.2 kg)  09/01/21 178 lb (80.7 kg)  03/30/21 186 lb 6.4 oz (84.6 kg)   Body mass index is 31.71 kg/m.    Physical Exam Constitutional:      General: She is not in acute distress.    Appearance: Normal appearance.  HENT:     Head: Normocephalic and atraumatic.  Eyes:     Conjunctiva/sclera: Conjunctivae normal.  Cardiovascular:     Rate and Rhythm: Normal rate and regular rhythm.     Heart sounds: Normal heart sounds. No murmur heard. Pulmonary:     Effort: Pulmonary effort  is normal. No respiratory distress.     Breath sounds: Normal breath sounds. No wheezing.  Musculoskeletal:     Cervical back: Neck supple.     Right lower leg: Edema (1+) present.     Left lower leg: Edema (1+) present.  Lymphadenopathy:     Cervical: No cervical adenopathy.  Skin:    General: Skin is warm and dry.     Findings: No rash.  Neurological:     Mental Status: She is alert. Mental status is at baseline.  Psychiatric:        Mood and Affect: Mood normal.        Behavior: Behavior normal.       Lab Results  Component Value Date   WBC 7.6 01/09/2021   HGB 13.7 01/09/2021   HCT 40.6 01/09/2021   PLT 315.0 01/09/2021   GLUCOSE 98 09/01/2021   CHOL 172 09/01/2021   TRIG 205.0 (H) 09/01/2021   HDL 52.20 09/01/2021   LDLDIRECT 86.0 09/01/2021   LDLCALC 68 01/09/2021   ALT 14 09/01/2021   AST 16 09/01/2021   NA 141 09/01/2021   K 3.7 09/01/2021   CL 102 09/01/2021   CREATININE 1.06 09/01/2021   BUN 17 09/01/2021   CO2 31 09/01/2021   TSH 0.49 09/01/2021   INR 2.33 (H) 12/02/2009   HGBA1C 5.5 09/01/2021     Assessment & Plan:    See Problem List for Assessment and Plan of chronic medical problems.

## 2022-02-28 NOTE — Patient Instructions (Addendum)
     Have blood work today and have blood work done in one week.     Medications changes include :   stop hydrochlorothiazide and start furosemide 20 mg daily in morning.   Your prescription(s) have been sent to your pharmacy.    A Echocardiogram or ultrasound of your heart was ordered.     Someone  will call you to schedule an appointment.    Return in about 6 months (around 09/02/2022) for follow up, Schedule DEXA-[facility] - ordered previously.

## 2022-03-02 ENCOUNTER — Encounter: Payer: Self-pay | Admitting: Internal Medicine

## 2022-03-02 ENCOUNTER — Ambulatory Visit (INDEPENDENT_AMBULATORY_CARE_PROVIDER_SITE_OTHER): Payer: Medicare Other | Admitting: Internal Medicine

## 2022-03-02 VITALS — BP 132/80 | HR 78 | Temp 98.0°F | Ht 63.0 in | Wt 179.0 lb

## 2022-03-02 DIAGNOSIS — R600 Localized edema: Secondary | ICD-10-CM

## 2022-03-02 DIAGNOSIS — G4709 Other insomnia: Secondary | ICD-10-CM | POA: Diagnosis not present

## 2022-03-02 DIAGNOSIS — I10 Essential (primary) hypertension: Secondary | ICD-10-CM

## 2022-03-02 DIAGNOSIS — N1831 Chronic kidney disease, stage 3a: Secondary | ICD-10-CM | POA: Diagnosis not present

## 2022-03-02 DIAGNOSIS — E039 Hypothyroidism, unspecified: Secondary | ICD-10-CM

## 2022-03-02 DIAGNOSIS — K219 Gastro-esophageal reflux disease without esophagitis: Secondary | ICD-10-CM | POA: Diagnosis not present

## 2022-03-02 DIAGNOSIS — F419 Anxiety disorder, unspecified: Secondary | ICD-10-CM

## 2022-03-02 DIAGNOSIS — R0602 Shortness of breath: Secondary | ICD-10-CM

## 2022-03-02 DIAGNOSIS — E7800 Pure hypercholesterolemia, unspecified: Secondary | ICD-10-CM | POA: Diagnosis not present

## 2022-03-02 DIAGNOSIS — F3289 Other specified depressive episodes: Secondary | ICD-10-CM

## 2022-03-02 LAB — TSH: TSH: 4.39 u[IU]/mL (ref 0.35–5.50)

## 2022-03-02 LAB — COMPREHENSIVE METABOLIC PANEL
ALT: 13 U/L (ref 0–35)
AST: 17 U/L (ref 0–37)
Albumin: 4.3 g/dL (ref 3.5–5.2)
Alkaline Phosphatase: 100 U/L (ref 39–117)
BUN: 20 mg/dL (ref 6–23)
CO2: 33 mEq/L — ABNORMAL HIGH (ref 19–32)
Calcium: 9.8 mg/dL (ref 8.4–10.5)
Chloride: 101 mEq/L (ref 96–112)
Creatinine, Ser: 0.99 mg/dL (ref 0.40–1.20)
GFR: 54.9 mL/min — ABNORMAL LOW (ref >60.00)
Glucose, Bld: 92 mg/dL (ref 70–99)
Potassium: 3.8 mEq/L (ref 3.5–5.1)
Sodium: 142 mEq/L (ref 135–145)
Total Bilirubin: 0.6 mg/dL (ref 0.2–1.2)
Total Protein: 6.8 g/dL (ref 6.0–8.3)

## 2022-03-02 LAB — CBC WITH DIFFERENTIAL/PLATELET
Basophils Absolute: 0 10*3/uL (ref 0.0–0.1)
Basophils Relative: 0.1 % (ref 0.0–3.0)
Eosinophils Absolute: 0.2 10*3/uL (ref 0.0–0.7)
Eosinophils Relative: 2 % (ref 0.0–5.0)
HCT: 39.4 % (ref 36.0–46.0)
Hemoglobin: 13 g/dL (ref 12.0–15.0)
Lymphocytes Relative: 26.9 % (ref 12.0–46.0)
Lymphs Abs: 2.6 10*3/uL (ref 0.7–4.0)
MCHC: 33 g/dL (ref 30.0–36.0)
MCV: 92.7 fl (ref 78.0–100.0)
Monocytes Absolute: 0.7 10*3/uL (ref 0.1–1.0)
Monocytes Relative: 7.4 % (ref 3.0–12.0)
Neutro Abs: 6 10*3/uL (ref 1.4–7.7)
Neutrophils Relative %: 63.6 % (ref 43.0–77.0)
Platelets: 330 10*3/uL (ref 150.0–400.0)
RBC: 4.24 Mil/uL (ref 3.87–5.11)
RDW: 13.9 % (ref 11.5–15.5)
WBC: 9.5 10*3/uL (ref 4.0–10.5)

## 2022-03-02 LAB — LIPID PANEL
Cholesterol: 150 mg/dL (ref 0–200)
HDL: 52.3 mg/dL (ref >39.00)
LDL Cholesterol: 71 mg/dL (ref 0–99)
NonHDL: 98.03
Total CHOL/HDL Ratio: 3
Triglycerides: 133 mg/dL (ref 0.0–149.0)
VLDL: 26.6 mg/dL (ref 0.0–40.0)

## 2022-03-02 MED ORDER — FUROSEMIDE 20 MG PO TABS: 20.0000 mg | ORAL_TABLET | Freq: Every day | ORAL | 5 refills | Status: DC

## 2022-03-02 NOTE — Assessment & Plan Note (Signed)
Chronic Controlled, improved Continue Effexor to 25 mg daily, alprazolam 0.5 mg 3 times daily

## 2022-03-02 NOTE — Assessment & Plan Note (Signed)
Chronic Controlled, Stable Continue Effexor to 25 mg daily

## 2022-03-02 NOTE — Assessment & Plan Note (Signed)
Chronic, but worse Leg swelling has typically been controlled, but she is experiencing increased leg edema Likely multifactorial Stressed low-sodium diet, which she is not compliant with since her husband died Elevate legs when sitting, but advised getting up and walking around the house throughout the day Consider compression socks Weight loss, regular exercise advised CMP Will discontinue hydrochlorothiazide 25 mg daily and start furosemide 20 mg daily Continue potassium 20 mill equivalents daily BMP in 1 week

## 2022-03-02 NOTE — Assessment & Plan Note (Signed)
Chronic GERD controlled Continue famotidine 40 mg daily, omeprazole 40 mg bid

## 2022-03-02 NOTE — Assessment & Plan Note (Signed)
Chronic CMP today Having increased leg edema We will discontinue HCTZ 25 mg daily Start furosemide 20 mg daily-continue potassium CMP today, BMP next week Encouraged elevating legs, getting up and walking frequently, possible compression socks, low-sodium diet

## 2022-03-02 NOTE — Assessment & Plan Note (Signed)
Chronic Controlled, Stable Continue Seroquel 100 mg nightly

## 2022-03-02 NOTE — Assessment & Plan Note (Signed)
Chronic  Clinically euthyroid Currently taking levothyroxine 112 mcg daily Check tsh  Titrate med dose if needed

## 2022-03-02 NOTE — Assessment & Plan Note (Signed)
Chronic Regular exercise and healthy diet encouraged Check lipid panel  Continue simvastatin 40 mg daily 

## 2022-03-02 NOTE — Assessment & Plan Note (Addendum)
Chronic BP well controlled Continue amlodipine 5 mg daily Will be changing HCTZ 25 mg daily to furosemide 20 mg daily due to leg edema cmp

## 2022-03-08 ENCOUNTER — Telehealth: Payer: Self-pay

## 2022-03-08 NOTE — Telephone Encounter (Signed)
Pt called in to schedule appointment for lab for Friday 03/12/22.   Also I transferred pt to Vascular Scheduling team to schedule her ECHO.   FYI

## 2022-03-11 ENCOUNTER — Telehealth: Payer: Self-pay | Admitting: Internal Medicine

## 2022-03-11 ENCOUNTER — Other Ambulatory Visit: Payer: Self-pay | Admitting: Internal Medicine

## 2022-03-11 DIAGNOSIS — F419 Anxiety disorder, unspecified: Secondary | ICD-10-CM

## 2022-03-11 NOTE — Telephone Encounter (Signed)
Pharmacy has a question on the rx for venlafaxine that was sent in today - instructions are unclear.  Please call pharmacy back

## 2022-03-12 ENCOUNTER — Other Ambulatory Visit (INDEPENDENT_AMBULATORY_CARE_PROVIDER_SITE_OTHER): Payer: Medicare Other

## 2022-03-12 DIAGNOSIS — I10 Essential (primary) hypertension: Secondary | ICD-10-CM | POA: Diagnosis not present

## 2022-03-12 DIAGNOSIS — N1831 Chronic kidney disease, stage 3a: Secondary | ICD-10-CM

## 2022-03-12 LAB — BASIC METABOLIC PANEL
BUN: 17 mg/dL (ref 6–23)
CO2: 32 mEq/L (ref 19–32)
Calcium: 9.1 mg/dL (ref 8.4–10.5)
Chloride: 103 mEq/L (ref 96–112)
Creatinine, Ser: 0.88 mg/dL (ref 0.40–1.20)
GFR: 63.22 mL/min (ref >60.00)
Glucose, Bld: 80 mg/dL (ref 70–99)
Potassium: 3.9 mEq/L (ref 3.5–5.1)
Sodium: 144 mEq/L (ref 135–145)

## 2022-03-12 NOTE — Telephone Encounter (Signed)
I was able to call the pharmacy and clarify the information for the pharmacist.

## 2022-03-15 ENCOUNTER — Ambulatory Visit (HOSPITAL_COMMUNITY)
Admission: RE | Admit: 2022-03-15 | Discharge: 2022-03-15 | Disposition: A | Discharge status: Home / Self Care | Payer: Medicare Other | Source: Ambulatory Visit | Attending: Internal Medicine | Admitting: Internal Medicine

## 2022-03-15 DIAGNOSIS — I10 Essential (primary) hypertension: Secondary | ICD-10-CM | POA: Diagnosis not present

## 2022-03-15 DIAGNOSIS — R0602 Shortness of breath: Secondary | ICD-10-CM | POA: Diagnosis not present

## 2022-03-15 LAB — ECHOCARDIOGRAM COMPLETE
AR max vel: 2.64 cm2
AV Peak grad: 6.7 mmHg
Ao pk vel: 1.3 m/s
Area-P 1/2: 4.19 cm2
Calc EF: 58.3 %
S' Lateral: 2.9 cm
Single Plane A2C EF: 58.4 %
Single Plane A4C EF: 58.4 %

## 2022-03-30 DIAGNOSIS — H524 Presbyopia: Secondary | ICD-10-CM | POA: Diagnosis not present

## 2022-03-30 DIAGNOSIS — Z961 Presence of intraocular lens: Secondary | ICD-10-CM | POA: Diagnosis not present

## 2022-03-30 DIAGNOSIS — H52203 Unspecified astigmatism, bilateral: Secondary | ICD-10-CM | POA: Diagnosis not present

## 2022-04-07 ENCOUNTER — Other Ambulatory Visit: Payer: Self-pay | Admitting: Internal Medicine

## 2022-04-08 ENCOUNTER — Other Ambulatory Visit: Payer: Self-pay | Admitting: Internal Medicine

## 2022-04-08 DIAGNOSIS — F419 Anxiety disorder, unspecified: Secondary | ICD-10-CM

## 2022-04-10 ENCOUNTER — Other Ambulatory Visit: Payer: Self-pay | Admitting: Internal Medicine

## 2022-04-24 ENCOUNTER — Other Ambulatory Visit: Payer: Self-pay | Admitting: Internal Medicine

## 2022-04-26 ENCOUNTER — Other Ambulatory Visit: Payer: Self-pay | Admitting: Internal Medicine

## 2022-04-28 ENCOUNTER — Other Ambulatory Visit: Payer: Self-pay | Admitting: Internal Medicine

## 2022-05-11 ENCOUNTER — Telehealth: Payer: Self-pay | Admitting: Internal Medicine

## 2022-05-11 ENCOUNTER — Telehealth: Payer: Self-pay

## 2022-05-11 DIAGNOSIS — F419 Anxiety disorder, unspecified: Secondary | ICD-10-CM

## 2022-05-11 MED ORDER — ALPRAZOLAM 0.5 MG PO TABS: ORAL_TABLET | ORAL | 0 refills | Status: DC

## 2022-05-11 NOTE — Telephone Encounter (Signed)
Rx sent to pharmacy   

## 2022-05-11 NOTE — Telephone Encounter (Signed)
Please contact pt at [phone] with the reason why her med refill for ALPRAZolam Duanne Moron) 0.5 MG tablet has been denied as she is in need of a refill.  LOV: 03/02/2022

## 2022-05-15 ENCOUNTER — Emergency Department (HOSPITAL_COMMUNITY): Payer: Medicare Other

## 2022-05-15 ENCOUNTER — Emergency Department (HOSPITAL_COMMUNITY)
Admission: EM | Admit: 2022-05-15 | Discharge: 2022-05-15 | Disposition: A | Discharge status: Home / Self Care | Payer: Medicare Other | Attending: Emergency Medicine | Admitting: Emergency Medicine

## 2022-05-15 ENCOUNTER — Encounter (HOSPITAL_COMMUNITY): Payer: Self-pay | Admitting: Emergency Medicine

## 2022-05-15 ENCOUNTER — Other Ambulatory Visit: Payer: Self-pay

## 2022-05-15 DIAGNOSIS — R269 Unspecified abnormalities of gait and mobility: Secondary | ICD-10-CM | POA: Diagnosis not present

## 2022-05-15 DIAGNOSIS — W19XXXA Unspecified fall, initial encounter: Secondary | ICD-10-CM | POA: Diagnosis not present

## 2022-05-15 DIAGNOSIS — E876 Hypokalemia: Secondary | ICD-10-CM | POA: Diagnosis not present

## 2022-05-15 DIAGNOSIS — S12390A Other displaced fracture of fourth cervical vertebra, initial encounter for closed fracture: Secondary | ICD-10-CM | POA: Diagnosis not present

## 2022-05-15 DIAGNOSIS — S12300A Unspecified displaced fracture of fourth cervical vertebra, initial encounter for closed fracture: Secondary | ICD-10-CM | POA: Diagnosis not present

## 2022-05-15 DIAGNOSIS — R296 Repeated falls: Secondary | ICD-10-CM

## 2022-05-15 DIAGNOSIS — S01511A Laceration without foreign body of lip, initial encounter: Secondary | ICD-10-CM | POA: Diagnosis not present

## 2022-05-15 DIAGNOSIS — S22000A Wedge compression fracture of unspecified thoracic vertebra, initial encounter for closed fracture: Secondary | ICD-10-CM | POA: Diagnosis not present

## 2022-05-15 DIAGNOSIS — R9431 Abnormal electrocardiogram [ECG] [EKG]: Secondary | ICD-10-CM | POA: Diagnosis not present

## 2022-05-15 DIAGNOSIS — S12400A Unspecified displaced fracture of fifth cervical vertebra, initial encounter for closed fracture: Secondary | ICD-10-CM | POA: Diagnosis not present

## 2022-05-15 DIAGNOSIS — S22029A Unspecified fracture of second thoracic vertebra, initial encounter for closed fracture: Secondary | ICD-10-CM | POA: Insufficient documentation

## 2022-05-15 DIAGNOSIS — Y92002 Bathroom of unspecified non-institutional (private) residence single-family (private) house as the place of occurrence of the external cause: Secondary | ICD-10-CM | POA: Diagnosis not present

## 2022-05-15 DIAGNOSIS — R404 Transient alteration of awareness: Secondary | ICD-10-CM | POA: Diagnosis not present

## 2022-05-15 DIAGNOSIS — S199XXA Unspecified injury of neck, initial encounter: Secondary | ICD-10-CM | POA: Diagnosis present

## 2022-05-15 DIAGNOSIS — E039 Hypothyroidism, unspecified: Secondary | ICD-10-CM | POA: Diagnosis present

## 2022-05-15 DIAGNOSIS — I10 Essential (primary) hypertension: Secondary | ICD-10-CM | POA: Diagnosis present

## 2022-05-15 DIAGNOSIS — Z79899 Other long term (current) drug therapy: Secondary | ICD-10-CM | POA: Insufficient documentation

## 2022-05-15 DIAGNOSIS — S129XXA Fracture of neck, unspecified, initial encounter: Secondary | ICD-10-CM

## 2022-05-15 DIAGNOSIS — N1831 Chronic kidney disease, stage 3a: Secondary | ICD-10-CM | POA: Diagnosis present

## 2022-05-15 DIAGNOSIS — S0990XA Unspecified injury of head, initial encounter: Secondary | ICD-10-CM | POA: Insufficient documentation

## 2022-05-15 DIAGNOSIS — F419 Anxiety disorder, unspecified: Secondary | ICD-10-CM | POA: Diagnosis present

## 2022-05-15 DIAGNOSIS — E7800 Pure hypercholesterolemia, unspecified: Secondary | ICD-10-CM | POA: Diagnosis present

## 2022-05-15 DIAGNOSIS — R2689 Other abnormalities of gait and mobility: Secondary | ICD-10-CM

## 2022-05-15 DIAGNOSIS — R42 Dizziness and giddiness: Secondary | ICD-10-CM | POA: Diagnosis not present

## 2022-05-15 DIAGNOSIS — Z743 Need for continuous supervision: Secondary | ICD-10-CM | POA: Diagnosis not present

## 2022-05-15 LAB — URINALYSIS, ROUTINE W REFLEX MICROSCOPIC
Bilirubin Urine: NEGATIVE
Glucose, UA: NEGATIVE mg/dL
Hgb urine dipstick: NEGATIVE
Ketones, ur: NEGATIVE mg/dL
Leukocytes,Ua: NEGATIVE
Nitrite: NEGATIVE
Protein, ur: NEGATIVE mg/dL
Specific Gravity, Urine: 1.015 (ref 1.005–1.030)
pH: 5 (ref 5.0–8.0)

## 2022-05-15 LAB — BASIC METABOLIC PANEL
Anion gap: 10 (ref 5–15)
BUN: 19 mg/dL (ref 8–23)
CO2: 32 mmol/L (ref 22–32)
Calcium: 9 mg/dL (ref 8.9–10.3)
Chloride: 99 mmol/L (ref 98–111)
Creatinine, Ser: 1.07 mg/dL — ABNORMAL HIGH (ref 0.44–1.00)
GFR, Estimated: 53 mL/min — ABNORMAL LOW (ref >60)
Glucose, Bld: 104 mg/dL — ABNORMAL HIGH (ref 70–99)
Potassium: 2.5 mmol/L — CL (ref 3.5–5.1)
Sodium: 141 mmol/L (ref 135–145)

## 2022-05-15 LAB — CBC WITH DIFFERENTIAL/PLATELET
Abs Immature Granulocytes: 0.05 10*3/uL (ref 0.00–0.07)
Basophils Absolute: 0 10*3/uL (ref 0.0–0.1)
Basophils Relative: 0 %
Eosinophils Absolute: 0.2 10*3/uL (ref 0.0–0.5)
Eosinophils Relative: 2 %
HCT: 40.9 % (ref 36.0–46.0)
Hemoglobin: 13.6 g/dL (ref 12.0–15.0)
Immature Granulocytes: 1 %
Lymphocytes Relative: 29 %
Lymphs Abs: 2.5 10*3/uL (ref 0.7–4.0)
MCH: 31.3 pg (ref 26.0–34.0)
MCHC: 33.3 g/dL (ref 30.0–36.0)
MCV: 94.2 fL (ref 80.0–100.0)
Monocytes Absolute: 0.8 10*3/uL (ref 0.1–1.0)
Monocytes Relative: 10 %
Neutro Abs: 5 10*3/uL (ref 1.7–7.7)
Neutrophils Relative %: 58 %
Platelets: 298 10*3/uL (ref 150–400)
RBC: 4.34 MIL/uL (ref 3.87–5.11)
RDW: 12.4 % (ref 11.5–15.5)
WBC: 8.6 10*3/uL (ref 4.0–10.5)
nRBC: 0 % (ref 0.0–0.2)

## 2022-05-15 LAB — I-STAT VENOUS BLOOD GAS, ED
Acid-Base Excess: 12 mmol/L — ABNORMAL HIGH (ref 0.0–2.0)
Bicarbonate: 38.9 mmol/L — ABNORMAL HIGH (ref 20.0–28.0)
Calcium, Ion: 1.13 mmol/L — ABNORMAL LOW (ref 1.15–1.40)
HCT: 39 % (ref 36.0–46.0)
Hemoglobin: 13.3 g/dL (ref 12.0–15.0)
O2 Saturation: 84 %
Potassium: 3 mmol/L — ABNORMAL LOW (ref 3.5–5.1)
Sodium: 141 mmol/L (ref 135–145)
TCO2: 41 mmol/L — ABNORMAL HIGH (ref 22–32)
pCO2, Ven: 57.1 mmHg (ref 44–60)
pH, Ven: 7.442 — ABNORMAL HIGH (ref 7.25–7.43)
pO2, Ven: 48 mmHg — ABNORMAL HIGH (ref 32–45)

## 2022-05-15 LAB — HEPATIC FUNCTION PANEL
ALT: 17 U/L (ref 0–44)
AST: 25 U/L (ref 15–41)
Albumin: 3.7 g/dL (ref 3.5–5.0)
Alkaline Phosphatase: 56 U/L (ref 38–126)
Bilirubin, Direct: 0.3 mg/dL — ABNORMAL HIGH (ref 0.0–0.2)
Indirect Bilirubin: 0.4 mg/dL (ref 0.3–0.9)
Total Bilirubin: 0.7 mg/dL (ref 0.3–1.2)
Total Protein: 6.1 g/dL — ABNORMAL LOW (ref 6.5–8.1)

## 2022-05-15 LAB — RAPID URINE DRUG SCREEN, HOSP PERFORMED
Amphetamines: NOT DETECTED
Barbiturates: NOT DETECTED
Benzodiazepines: POSITIVE — AB
Cocaine: NOT DETECTED
Opiates: NOT DETECTED
Tetrahydrocannabinol: NOT DETECTED

## 2022-05-15 LAB — TSH: TSH: 13.511 u[IU]/mL — ABNORMAL HIGH (ref 0.350–4.500)

## 2022-05-15 LAB — AMMONIA: Ammonia: 27 umol/L (ref 9–35)

## 2022-05-15 LAB — ACETAMINOPHEN LEVEL: Acetaminophen (Tylenol), Serum: <10 ug/mL — ABNORMAL LOW (ref 10–30)

## 2022-05-15 LAB — ETHANOL: Alcohol, Ethyl (B): <10 mg/dL (ref <10)

## 2022-05-15 MED ORDER — LIDOCAINE-EPINEPHRINE (PF) 2 %-1:200000 IJ SOLN
INTRAMUSCULAR | Status: AC
  Filled 2022-05-15: qty 20

## 2022-05-15 MED ORDER — POTASSIUM CHLORIDE CRYS ER 20 MEQ PO TBCR
40.0000 meq | EXTENDED_RELEASE_TABLET | Freq: Once | ORAL | Status: DC
Start: 2022-05-15 — End: 2022-05-16

## 2022-05-15 MED ORDER — LIDOCAINE-EPINEPHRINE (PF) 1 %-1:200000 IJ SOLN
10.0000 mL | Freq: Once | INTRAMUSCULAR | Status: AC
  Administered 2022-05-15: 10 mL
  Filled 2022-05-15 (×2): qty 10

## 2022-05-15 MED ORDER — POTASSIUM CHLORIDE CRYS ER 20 MEQ PO TBCR
40.0000 meq | EXTENDED_RELEASE_TABLET | Freq: Once | ORAL | Status: AC
  Administered 2022-05-15: 40 meq via ORAL
  Filled 2022-05-15: qty 2

## 2022-05-15 MED ORDER — LACTATED RINGERS IV SOLN: INTRAVENOUS | Status: DC

## 2022-05-15 MED ORDER — POTASSIUM CHLORIDE 10 MEQ/100ML IV SOLN
10.0000 meq | INTRAVENOUS | Status: AC
  Administered 2022-05-15 (×3): 10 meq via INTRAVENOUS
  Filled 2022-05-15 (×3): qty 100

## 2022-05-15 MED ORDER — ACETAMINOPHEN 500 MG PO TABS
1000.0000 mg | ORAL_TABLET | Freq: Once | ORAL | Status: AC
  Administered 2022-05-15: 1000 mg via ORAL
  Filled 2022-05-15: qty 2

## 2022-05-15 NOTE — ED Notes (Signed)
Dr. Johnney Killian at bedside assessing pt at this time.

## 2022-05-15 NOTE — Discharge Instructions (Addendum)
Suture removal in 5 to 7 days.  Potassium was low today.  Follow-up with your primary care doctor to have that rechecked.  Potassium received here in the emergency department should be adequate to bring it up some.  Follow-up with orthopedics for the compression fracture of your back.  Return for any new or worse symptoms.

## 2022-05-15 NOTE — ED Triage Notes (Signed)
Patient arrived with EMS from home felt dizzy / lightheaded this morning and fell on carpeted floor , no LOC , presents with upper lip laceration approx. 1/2 " with mild bleeding . Patient appears somnolent at arrival .

## 2022-05-15 NOTE — ED Notes (Signed)
C-collar applied

## 2022-05-15 NOTE — ED Notes (Signed)
Pt provided with ginger ale and crackers per her request. OK per MD.

## 2022-05-15 NOTE — ED Notes (Signed)
AVS provided to and discussed with patient. Pt verbalizes understanding of discharge instructions and denies any questions or concerns at this time. Taxi voucher provided to patient per her request. Pt ambulated out of department independently with steady gait. Taxi called for ride home.

## 2022-05-15 NOTE — ED Notes (Signed)
Pt earrings and necklace removed for ct scan and placed in a pt belonging envelope. Rn emilee made aware.

## 2022-05-15 NOTE — Consult Note (Signed)
Consult Note   Patient: Dawn Kim [id number] DOB: 10-May-1946 DOA: 05/15/2022 DOS: the patient was seen and examined on 05/15/2022 PCP: Binnie Rail, MD  Patient coming from: Home - lives alone; NOK: Daughter, [place], [phone]   Chief Complaint: Fall  HPI: Dawn Kim is a 76 y.o. female with medical history significant of anxiety/depression, HTN, HLD, and hypothyroidism presenting with a fall.  The patient reports that she was in the bathroom and fell, hitting her upper lip (s/p repair).  She says that she feels well now and does not feel like she needs to stay in the hospital longer.  The nurse was asked to get her up and ambulate her and she did "remarkably well".  As such, the patient does not appear to need admission at this time.    ER Course:  Lives alone, no one nearby.  Unsteady at baseline, fell overnight.  Lip lac repaired.  C3-4 spinous fracture, stable.  Thoracic compression. Stable.  Hypokalemia, giving K+.  Has been somnolent through the day, ?medication related and waking up now.     Review of Systems: As mentioned in the history of present illness. All other systems reviewed and are negative. Past Medical History:  Diagnosis Date   Anxiety    Cataract    Bil   Depression    Hypercholesteremia    Hypertension    Thyroid disease    Past Surgical History:  Procedure Laterality Date   CHOLECYSTECTOMY     COLONOSCOPY     FOOT SURGERY  2011   right foot   JOINT REPLACEMENT  2010   left knee   POLYPECTOMY     REPLACEMENT TOTAL KNEE Left    TONSILLECTOMY     Social History:  reports that she quit smoking about 12 years ago. Her smoking use included cigarettes. She has never used smokeless tobacco. She reports that she does not drink alcohol and does not use drugs.  Allergies  Allergen Reactions   Gabapentin     Caused parkinsonism   Lunesta [Eszopiclone]     Ataxia, confusion   Penicillins Hives    Has patient had a PCN reaction  causing immediate rash, facial/tongue/throat swelling, SOB or lightheadedness with hypotension: No Has patient had a PCN reaction causing severe rash involving mucus membranes or skin necrosis: Yes  Has patient had a PCN reaction that required hospitalization: No Has patient had a PCN reaction occurring within the last 10 years: No  If all of the above answers are "NO", then may proceed with Cephalosporin use.    Sonata [Zaleplon]     Confusion, memory difficulties, ataxia   Ambien [Zolpidem]     Confusion, hung over feeling   Amoxicillin Rash    Has patient had a PCN reaction causing immediate rash, facial/tongue/throat swelling, SOB or lightheadedness with hypotension: No  Has patient had a PCN reaction causing severe rash involving mucus membranes or skin necrosis: Yes Has patient had a PCN reaction that required hospitalization No Has patient had a PCN reaction occurring within the last 10 years: No If all of the above answers are "NO", then may proceed with Cephalosporin use.     Family History  Problem Relation Age of Onset   Heart disease Mother    Drug abuse Sister     Prior to Admission medications   Medication Sig Start Date End Date Taking? Authorizing Provider  acetaminophen (TYLENOL) 325 MG tablet Take 650 mg by mouth every 6 (six) hours  as needed for mild pain or moderate pain.     [provider]  ALPRAZolam (XANAX) 0.5 MG tablet TAKE 1 TABLET BY MOUTH THREE TIMES DAILY AS NEEDED FOR ANXIETY . DO NOT EXCEED 3 PER 24 HOURS 05/11/22   Burns, Claudina Lick, MD  amLODipine (NORVASC) 5 MG tablet Take 1 tablet (5 mg total) by mouth daily. 11/09/21   Binnie Rail, MD  calcium carbonate (OS-CAL - DOSED IN MG OF ELEMENTAL CALCIUM) 1250 (500 Ca) MG tablet Take 1 tablet (500 mg of elemental calcium total) by mouth daily. 05/31/16   Nche, Charlene Brooke, NP  cholecalciferol (VITAMIN D) 1000 UNITS tablet Take 1,000 Units by mouth daily.    [provider]  cilostazol  (PLETAL) 50 MG tablet Take 1/2 (one-half) tablet by mouth twice daily 04/07/22   Binnie Rail, MD  famotidine (PEPCID) 40 MG tablet TAKE 1 TABLET BY MOUTH AT BEDTIME 03/02/22   Burns, Claudina Lick, MD  ferrous sulfate 325 (65 FE) MG EC tablet Take 325 mg by mouth daily with breakfast.    [provider]  fish oil-omega-3 fatty acids 1000 MG capsule Take 1 g by mouth 2 (two) times daily.     [provider]  furosemide (LASIX) 20 MG tablet Take 1 tablet (20 mg total) by mouth daily. 03/02/22   Binnie Rail, MD  levocetirizine (XYZAL) 5 MG tablet TAKE 1 TABLET BY MOUTH ONCE DAILY IN THE EVENING 04/27/22   Binnie Rail, MD  levothyroxine (SYNTHROID) 112 MCG tablet Take 1 tablet by mouth once daily 02/15/22   Binnie Rail, MD  Multiple Vitamin (MULTIVITAMIN) tablet Take 1 tablet by mouth daily.    [provider]  omeprazole (PRILOSEC) 40 MG capsule TAKE 1 CAPSULE BY MOUTH TWICE DAILY BEFORE A MEAL 03/02/22   Burns, Claudina Lick, MD  potassium chloride (KLOR-CON M) 10 MEQ tablet Take 2 tablets by mouth once daily 04/12/22   Binnie Rail, MD  QUEtiapine (SEROQUEL) 100 MG tablet TAKE 1 TABLET BY MOUTH AT BEDTIME 04/26/22   Binnie Rail, MD  simvastatin (ZOCOR) 40 MG tablet Take 1 tablet by mouth in the evening 03/02/22   Binnie Rail, MD  venlafaxine XR (EFFEXOR-XR) 150 MG 24 hr capsule Take 1 capsule (150 mg total) by mouth daily with breakfast. 04/28/22   Binnie Rail, MD  venlafaxine XR (EFFEXOR-XR) 75 MG 24 hr capsule TAKE 1 CAPSULE BY MOUTH ONCE DAILY WITH BREAKFAST OF 225 MG DAILY 04/26/22   Binnie Rail, MD    Physical Exam: Vitals:   05/15/22 1500 05/15/22 1545 05/15/22 1621 05/15/22 1729  BP: (!) 122/47 (!) 105/43 (!) 110/57 127/60  Pulse: 62 68 70 62  Resp: 18 (!) '26 20 17  '$ Temp:    97.9 F (36.6 C)  TempSrc:    Oral  SpO2: 98% 99% 99% 95%   General:  Appears calm and comfortable and is in NAD Eyes:  PERRL, EOMI, normal lids, iris ENT:  grossly normal hearing,  lips & tongue, mmm; edentulous; s/p repair of mid-upper lip Neck:  no LAD, masses or thyromegaly Cardiovascular:  RRR, no m/r/g. No LE edema.  Respiratory:   CTA bilaterally with no wheezes/rales/rhonchi.  Normal respiratory effort. Abdomen:  soft, NT, ND Skin:  no rash or induration seen on limited exam Musculoskeletal:  grossly normal tone BUE/BLE, good ROM, no bony abnormality Psychiatric:  grossly normal mood and affect, speech fluent and appropriate, AOx3 Neurologic:  CN  2-12 grossly intact, moves all extremities in coordinated fashion   Radiological Exams on Admission: Independently reviewed - see discussion in A/P where applicable  CT Cervical Spine Wo Contrast  Addendum Date: 05/15/2022   ADDENDUM REPORT: 05/15/2022 09:23 ADDENDUM: Study discussed by telephone with ED provider Cletis Media on 05/15/2022 at 0908 hours. Electronically Signed   By: Genevie Ann M.D.   On: 05/15/2022 09:23   Result Date: 05/15/2022 CLINICAL DATA:  76 year old female EXAM: CT CERVICAL SPINE WITHOUT CONTRAST TECHNIQUE: Multidetector CT imaging of the cervical spine was performed without intravenous contrast. Multiplanar CT image reconstructions were also generated. RADIATION DOSE REDUCTION: This exam was performed according to the departmental dose-optimization program which includes automated exposure control, adjustment of the mA and/or kV according to patient size and/or use of iterative reconstruction technique. COMPARISON:  Head CT today.  Face CT 04/28/2010. CTA chest 07/09/2013. FINDINGS: Study is mildly degraded by motion artifact despite repeated imaging attempts. Alignment: Mild straightening of cervical lordosis. Cervicothoracic junction alignment is within normal limits. Bilateral posterior element alignment is within normal limits. Skull base and vertebrae: Visualized skull base is intact. No atlanto-occipital dissociation. C1 and C2 appear intact and aligned. C4 spinous process fracture (series 3, image  51). This level was not included in 2011 and irregular margins suggest this is an acute fracture. But C4 body and remaining posterior elements appear intact and aligned. Other cervical vertebrae appear intact. Soft tissues and spinal canal: No prevertebral fluid or swelling. No visible canal hematoma. Partially retropharyngeal course of the right carotid with some calcified carotid atherosclerosis. Diminutive or absent thyroid. Disc levels:  Mild for age cervical spine degeneration. Upper chest: Chronic appearing T3 greater than T2 superior endplate deformities are new since chest CTA 2014. The T3 level most resembles a Schmorl's node. IMPRESSION: 1. Positive for C4 spinous process fracture which appears acute. No other fracture identified in the cervical spine. This should be a stable injury. 2. Age indeterminate mild T2 superior endplate compression is new since 2014. With T3 superior endplate Schmorl's node incidentally noted. Electronically Signed: By: Genevie Ann M.D. On: 05/15/2022 09:01   CT HEAD WO CONTRAST (5MM)  Result Date: 05/15/2022 CLINICAL DATA:  76 year old female status post fall. Dizziness. Struck face. EXAM: CT HEAD WITHOUT CONTRAST TECHNIQUE: Contiguous axial images were obtained from the base of the skull through the vertex without intravenous contrast. RADIATION DOSE REDUCTION: This exam was performed according to the departmental dose-optimization program which includes automated exposure control, adjustment of the mA and/or kV according to patient size and/or use of iterative reconstruction technique. COMPARISON:  Head CT 07/11/2018. FINDINGS: Brain: Cerebral volume is within normal limits for age. No midline shift, ventriculomegaly, mass effect, evidence of mass lesion, intracranial hemorrhage or evidence of cortically based acute infarction. Gray-white matter differentiation appears stable since 2019 and within normal limits for age. Small cavum septum pellucidum (normal variant). Vascular:  Calcified atherosclerosis at the skull base. No suspicious intracranial vascular hyperdensity. Skull: Stable. Hyperostosis of the calvarium, normal variant. Visible facial bones appear stable and intact. Sinuses/Orbits: Visualized paranasal sinuses and mastoids are stable and well aerated. Other: No acute orbit or scalp soft tissue injury identified. IMPRESSION: 1. No acute traumatic injury identified. 2. Stable since 2019 and normal for age non contrast CT appearance of the brain. Electronically Signed   By: Genevie Ann M.D.   On: 05/15/2022 08:55    EKG: Independently reviewed.  NSR with rate 67; nonspecific ST changes with no evidence of acute ischemia  Labs on Admission: I have personally reviewed the available labs and imaging studies at the time of the admission.  Pertinent labs:    K+ 2.5 -> 3.0 BUN 19/Creatinine 1.07/GFR 53 - stable VBG: 7.442/57.1/38.9 Normal CBC APAP <10 TSH 13.511   Assessment and Plan: Principal Problem:   Recurrent falls Active Problems:   HTN (hypertension)   Hypercholesterolemia   Hypothyroidism   Anxiety and depression   Chronic kidney disease, stage 3a (HCC)   Hypokalemia    Fall -Apparent C4 spinous process fracture, not bothersome to the patient currently -She had a lac repair of her upper lip -She reports that she feels well now and would like to go home -This seems reasonable -She reported to Dr. Johnney Killian that she was having recurrent falls but denied this at the time of my evaluation  Hypokalemia -She appears to be taking K+ supplements at home, may need to increase vs. Decrease lasix -She was given 70 mEq KCl replacement in the ER -Will order an additional 40 mEq PO x 1 now -F/u with Dr. Quay Burow next week for recheck  Stage 3a CKD -Appears to be stable at this time  Hypothyroidism -Continue Synthroid -Discuss with Dr. Quay Burow, as she likely needs an increased dose based on today's TSH  Anxiety/depression -Continue alprazolam, Seroquel  but use caution due to the risk of excessive somnolence -Continue Effexor  HTN -Continue amlodipine  HLD -Continue Zocor, fish oil  PAD -Continue Pletal   Thank you for this interesting consult.  Based on the overall clinical stability of the patient at this time, she appears to be appropriate for dc to home.  F/U next week with PCP to discuss increasing dose of Synthroid as well as for recheck BMP.    Author: Karmen Bongo, MD 05/15/2022 6:00 PM  For on call review [email or web address].

## 2022-05-15 NOTE — ED Provider Triage Note (Signed)
Emergency Medicine Provider Triage Evaluation Note  Dawn Kim , a 76 y.o. female  was evaluated in triage.  Pt complains of fall about 1 hour PTA.  Reports she got dizzy while in the bathroom and fell.  Struck face, sustained lack to philtrum area.  Unclear if LOC.  Not on anticoagulation.  She was able to stand with EMS but unsteady on her feet.  Reports she has cane/walker but does not use them regularly.  Tetanus UTD per chart review.  Review of Systems  Positive: Dizziness, fall Negative: fever  Physical Exam  BP (!) 114/99 (BP Location: Right Arm)   Pulse 64   Temp 97.9 F (36.6 C) (Oral)   Resp 14   SpO2 97%   Gen:   Awake, seems drowsy on exam   Resp:  Normal effort  MSK:   Moves extremities without difficulty  Other:  Laceration to philtrum area, not through and through  Medical Decision Making  Medically screening exam initiated at 5:24 AM.  Appropriate orders placed.  SHYLYNN BRUNING was informed that the remainder of the evaluation will be completed by another provider, this initial triage assessment does not replace that evaluation, and the importance of remaining in the ED until their evaluation is complete.  Dizziness, fall at home.  + head trauma but unclear LOC.  Laceration to philtrum.  Tetanus UTD.  Not on anticoagulation.  Patient is able to answer questions in triage, but does seem a bit drowsy.  It appears she takes Seroquel at bedtime, I suspect this is likely contributing.  Will check EKG, labs, CT head/C-spine.   Larene Pickett, PA-C 05/15/22 [phone]

## 2022-05-15 NOTE — ED Provider Notes (Signed)
Patient evaluated by [facility] hospitalist.  Patient was able to ambulate on her own.  Patient wants to go home.  Patient does not want admission.  Hospitalist did not want to admit based on her functionality.   Fredia Sorrow, MD 05/15/22 [phone]

## 2022-05-15 NOTE — ED Provider Notes (Signed)
[facility] EMERGENCY DEPARTMENT Provider Note   CSN: [id number] Arrival date & time: 05/15/22  3235     History  Chief Complaint  Patient presents with   Fall / Lip Laceration     Hypokalemia    Dawn Kim is a 76 y.o. female.  HPI Patient reports she got out of bed at about 5:30 in the morning to go to the bathroom.  She reports that she does have problems with being off balance and peripheral neuropathy which predisposes her to falling.  She reports she got into the bathroom when she fell.  She struck her face and had bleeding.  She reports she was however able to get to the living room and call for help.  She does have some pain in the back of her neck and in her upper gums.  She denies she has chest pain or abdominal pain.  She denies she has pain in the hips or legs.  She reports she does have peripheral neuropathy so she does not have very good sensation in her feet anyways.  Patient reports she has really bad shoulders so at baseline she can only pick her arms up a little bit.    Home Medications Prior to Admission medications   Medication Sig Start Date End Date Taking? Authorizing Provider  acetaminophen (TYLENOL) 325 MG tablet Take 650 mg by mouth every 6 (six) hours as needed for mild pain or moderate pain.     [provider]  ALPRAZolam (XANAX) 0.5 MG tablet TAKE 1 TABLET BY MOUTH THREE TIMES DAILY AS NEEDED FOR ANXIETY . DO NOT EXCEED 3 PER 24 HOURS 05/11/22   Burns, Claudina Lick, MD  amLODipine (NORVASC) 5 MG tablet Take 1 tablet (5 mg total) by mouth daily. 11/09/21   Binnie Rail, MD  calcium carbonate (OS-CAL - DOSED IN MG OF ELEMENTAL CALCIUM) 1250 (500 Ca) MG tablet Take 1 tablet (500 mg of elemental calcium total) by mouth daily. 05/31/16   Nche, Charlene Brooke, NP  cholecalciferol (VITAMIN D) 1000 UNITS tablet Take 1,000 Units by mouth daily.    [provider]  cilostazol (PLETAL) 50 MG tablet Take 1/2 (one-half) tablet by mouth  twice daily 04/07/22   Binnie Rail, MD  famotidine (PEPCID) 40 MG tablet TAKE 1 TABLET BY MOUTH AT BEDTIME 03/02/22   Burns, Claudina Lick, MD  ferrous sulfate 325 (65 FE) MG EC tablet Take 325 mg by mouth daily with breakfast.    [provider]  fish oil-omega-3 fatty acids 1000 MG capsule Take 1 g by mouth 2 (two) times daily.     [provider]  furosemide (LASIX) 20 MG tablet Take 1 tablet (20 mg total) by mouth daily. 03/02/22   Binnie Rail, MD  levocetirizine (XYZAL) 5 MG tablet TAKE 1 TABLET BY MOUTH ONCE DAILY IN THE EVENING 04/27/22   Binnie Rail, MD  levothyroxine (SYNTHROID) 112 MCG tablet Take 1 tablet by mouth once daily 02/15/22   Binnie Rail, MD  Multiple Vitamin (MULTIVITAMIN) tablet Take 1 tablet by mouth daily.    [provider]  omeprazole (PRILOSEC) 40 MG capsule TAKE 1 CAPSULE BY MOUTH TWICE DAILY BEFORE A MEAL 03/02/22   Burns, Claudina Lick, MD  potassium chloride (KLOR-CON M) 10 MEQ tablet Take 2 tablets by mouth once daily 04/12/22   Binnie Rail, MD  QUEtiapine (SEROQUEL) 100 MG tablet TAKE 1 TABLET BY MOUTH AT BEDTIME 04/26/22   Burns,  Claudina Lick, MD  simvastatin (ZOCOR) 40 MG tablet Take 1 tablet by mouth in the evening 03/02/22   Binnie Rail, MD  venlafaxine XR (EFFEXOR-XR) 150 MG 24 hr capsule Take 1 capsule (150 mg total) by mouth daily with breakfast. 04/28/22   Binnie Rail, MD  venlafaxine XR (EFFEXOR-XR) 75 MG 24 hr capsule TAKE 1 CAPSULE BY MOUTH ONCE DAILY WITH BREAKFAST OF 225 MG DAILY 04/26/22   Binnie Rail, MD      Allergies    Gabapentin, Lunesta [eszopiclone], Penicillins, Sonata [zaleplon], Ambien [zolpidem], and Amoxicillin    Review of Systems   Review of Systems 10 systems reviewed negative except as per HPI Physical Exam Updated Vital Signs BP (!) 110/57   Pulse 70   Temp 98 F (36.7 C) (Oral)   Resp 20   SpO2 99%  Physical Exam Constitutional:      Comments: Patient very drowsy but awakens and answers questions  appropriately.  No respiratory distress.  HENT:     Head:     Comments: 1 cm laceration slightly irregular on the philtrum.  No other apparent contusions or hematomas    Nose: Nose normal.     Mouth/Throat:     Comments: Mouth is very dry.  Edentulous.  No pooling secretions Eyes:     Extraocular Movements: Extraocular movements intact.     Pupils: Pupils are equal, round, and reactive to light.  Cardiovascular:     Rate and Rhythm: Normal rate and regular rhythm.  Pulmonary:     Effort: Pulmonary effort is normal.     Breath sounds: Normal breath sounds.  Chest:     Chest wall: No tenderness.  Abdominal:     General: There is no distension.     Palpations: Abdomen is soft.     Tenderness: There is no abdominal tenderness. There is no guarding.  Musculoskeletal:     Cervical back: Neck supple.     Comments: No apparent upper extremity deformities.  Patient reports she has very "bad shoulders" at baseline.  I can move them and put them through range of motion and to about 90 degrees.  Patient does not endorse any new or worsening pain with movements.  No evident deformities.  No apparent deformities of either lower extremity.  I can put the hips and knees into flexion and she can push against resistance.  Skin:    General: Skin is warm and dry.  Neurological:     Comments: Patient was consistently somnolent.  She was however arousable to light touch and voice.  Once awake she does answer questions appropriately.  No focal motor deficits.  Psychiatric:        Mood and Affect: Mood normal.     ED Results / Procedures / Treatments   Labs (all labs ordered are listed, but only abnormal results are displayed) Labs Reviewed  BASIC METABOLIC PANEL - Abnormal; Notable for the following components:      Result Value   Potassium 2.5 (*)    Glucose, Bld 104 (*)    Creatinine, Ser 1.07 (*)    GFR, Estimated 53 (*)    All other components within normal limits  HEPATIC FUNCTION PANEL -  Abnormal; Notable for the following components:   Total Protein 6.1 (*)    Bilirubin, Direct 0.3 (*)    All other components within normal limits  TSH - Abnormal; Notable for the following components:   TSH 13.511 (*)    All  other components within normal limits  ACETAMINOPHEN LEVEL - Abnormal; Notable for the following components:   Acetaminophen (Tylenol), Serum <10 (*)    All other components within normal limits  I-STAT VENOUS BLOOD GAS, ED - Abnormal; Notable for the following components:   pH, Ven 7.442 (*)    pO2, Ven 48 (*)    Bicarbonate 38.9 (*)    TCO2 41 (*)    Acid-Base Excess 12.0 (*)    Potassium 3.0 (*)    Calcium, Ion 1.13 (*)    All other components within normal limits  CBC WITH DIFFERENTIAL/PLATELET  AMMONIA  ETHANOL  URINALYSIS, ROUTINE W REFLEX MICROSCOPIC  RAPID URINE DRUG SCREEN, HOSP PERFORMED    EKG EKG Interpretation  Date/Time:  Saturday May 15 2022 05:31:51 EDT Ventricular Rate:  67 PR Interval:  142 QRS Duration: 92 QT Interval:  416 QTC Calculation: 439 R Axis:   -2 Text Interpretation: Normal sinus rhythm Cannot rule out Anterior infarct , age undetermined Abnormal ECG When compared with ECG of 18-Jun-2020 13:55, PREVIOUS ECG IS PRESENT decreased voltage with IVCD lateral leads since first previous. no acute appearing ischemic changes. Confirmed by Charlesetta Shanks [phone]) on 05/15/2022 9:37:01 AM  Radiology CT Cervical Spine Wo Contrast  Addendum Date: 05/15/2022   ADDENDUM REPORT: 05/15/2022 09:23 ADDENDUM: Study discussed by telephone with ED provider Cletis Media on 05/15/2022 at 0908 hours. Electronically Signed   By: Genevie Ann M.D.   On: 05/15/2022 09:23   Result Date: 05/15/2022 CLINICAL DATA:  76 year old female EXAM: CT CERVICAL SPINE WITHOUT CONTRAST TECHNIQUE: Multidetector CT imaging of the cervical spine was performed without intravenous contrast. Multiplanar CT image reconstructions were also generated. RADIATION DOSE REDUCTION:  This exam was performed according to the departmental dose-optimization program which includes automated exposure control, adjustment of the mA and/or kV according to patient size and/or use of iterative reconstruction technique. COMPARISON:  Head CT today.  Face CT 04/28/2010. CTA chest 07/09/2013. FINDINGS: Study is mildly degraded by motion artifact despite repeated imaging attempts. Alignment: Mild straightening of cervical lordosis. Cervicothoracic junction alignment is within normal limits. Bilateral posterior element alignment is within normal limits. Skull base and vertebrae: Visualized skull base is intact. No atlanto-occipital dissociation. C1 and C2 appear intact and aligned. C4 spinous process fracture (series 3, image 51). This level was not included in 2011 and irregular margins suggest this is an acute fracture. But C4 body and remaining posterior elements appear intact and aligned. Other cervical vertebrae appear intact. Soft tissues and spinal canal: No prevertebral fluid or swelling. No visible canal hematoma. Partially retropharyngeal course of the right carotid with some calcified carotid atherosclerosis. Diminutive or absent thyroid. Disc levels:  Mild for age cervical spine degeneration. Upper chest: Chronic appearing T3 greater than T2 superior endplate deformities are new since chest CTA 2014. The T3 level most resembles a Schmorl's node. IMPRESSION: 1. Positive for C4 spinous process fracture which appears acute. No other fracture identified in the cervical spine. This should be a stable injury. 2. Age indeterminate mild T2 superior endplate compression is new since 2014. With T3 superior endplate Schmorl's node incidentally noted. Electronically Signed: By: Genevie Ann M.D. On: 05/15/2022 09:01   CT HEAD WO CONTRAST (5MM)  Result Date: 05/15/2022 CLINICAL DATA:  76 year old female status post fall. Dizziness. Struck face. EXAM: CT HEAD WITHOUT CONTRAST TECHNIQUE: Contiguous axial images  were obtained from the base of the skull through the vertex without intravenous contrast. RADIATION DOSE REDUCTION: This exam was performed according  to the departmental dose-optimization program which includes automated exposure control, adjustment of the mA and/or kV according to patient size and/or use of iterative reconstruction technique. COMPARISON:  Head CT 07/11/2018. FINDINGS: Brain: Cerebral volume is within normal limits for age. No midline shift, ventriculomegaly, mass effect, evidence of mass lesion, intracranial hemorrhage or evidence of cortically based acute infarction. Gray-white matter differentiation appears stable since 2019 and within normal limits for age. Small cavum septum pellucidum (normal variant). Vascular: Calcified atherosclerosis at the skull base. No suspicious intracranial vascular hyperdensity. Skull: Stable. Hyperostosis of the calvarium, normal variant. Visible facial bones appear stable and intact. Sinuses/Orbits: Visualized paranasal sinuses and mastoids are stable and well aerated. Other: No acute orbit or scalp soft tissue injury identified. IMPRESSION: 1. No acute traumatic injury identified. 2. Stable since 2019 and normal for age non contrast CT appearance of the brain. Electronically Signed   By: Genevie Ann M.D.   On: 05/15/2022 08:55    Procedures .Marland KitchenLaceration Repair  Date/Time: 05/15/2022 5:01 PM  Performed by: Charlesetta Shanks, MD Authorized by: Charlesetta Shanks, MD   Consent:    Consent obtained:  Verbal   Consent given by:  Patient Anesthesia:    Anesthesia method:  Local infiltration   Local anesthetic:  Lidocaine 1% WITH epi Laceration details:    Location:  Lip   Lip location:  Upper exterior lip   Length (cm):  1   Depth (mm):  5 Pre-procedure details:    Preparation:  Patient was prepped and draped in usual sterile fashion Exploration:    Wound extent: muscle damage     Contaminated: no   Treatment:    Area cleansed with:  Saline and  Shur-Clens   Amount of cleaning:  Standard Skin repair:    Repair method:  Sutures   Suture size:  5-0   Suture technique:  Simple interrupted   Number of sutures:  2 Approximation:    Approximation:  Close Repair type:    Repair type:  Intermediate Post-procedure details:    Dressing:  Open (no dressing) Comments:     Patient had a approximately 1 cm vertically oriented laceration to the philtrum.  This did not go to the vermilion border.  Also had significant contusion and some titration to the inner mucosa.  Wound was extensively cleaned.  Good alignment with 2 Vicryl sutures interrupted.     Medications Ordered in ED Medications  lidocaine-EPINEPHrine (XYLOCAINE W/EPI) 2 %-1:200000 (PF) injection (  Not Given 05/15/22 1503)  potassium chloride 10 mEq in 100 mL IVPB (10 mEq Intravenous New Bag/Given 05/15/22 1606)  lactated ringers infusion ( Intravenous New Bag/Given 05/15/22 1453)  lidocaine-EPINEPHrine (PF) (XYLOCAINE-EPINEPHrine) 1 %-1:200000 (PF) injection 10 mL (10 mLs Infiltration Given by Other 05/15/22 1352)  potassium chloride SA (KLOR-CON M) CR tablet 40 mEq (40 mEq Oral Given 05/15/22 1228)  acetaminophen (TYLENOL) tablet 1,000 mg (1,000 mg Oral Given 05/15/22 1229)    ED Course/ Medical Decision Making/ A&P                           Medical Decision Making Amount and/or Complexity of Data Reviewed Labs: ordered.  Risk OTC drugs. Prescription drug management.   Patient presents with a fall and injury.  CT scans obtained through triage.  The patient was somnolent but awakened appropriately.  GCS 15 upon my first assessment.  Heart rate and blood pressures within normal range.  With advanced age, instability, fall and somnolence  we will proceed with broad diagnostic evaluation with lab work and imaging.  CT head and neck interpreted by radiology.  CT head without any intracranial bleeding or injury.  CT C-spine with stable C4 spinous process fracture.  So identified  small endplate fractures on thoracic spine.  The chemistries returned with a potassium of 2.5.  CBC normal without anemia or leukocytosis.  EKG reviewed by myself shows some new or interventricular conduction delay in the lateral leads.  No acute ischemic appearance.  Patient has been observed and rechecked multiple times.  Vital signs have remained stable.  Patient has been very somnolent but arousable.  I did speak with the patient's daughters.  One is on a mission trip in [place] and not available to come to the emergency department at this time.  The other daughter lives significantly out of town.  I have reviewed all of the findings.  Patient's daughter lives out of town talk to her mom yesterday at 67 AM and she seemed normal at that time.  That was the last time anyone had spoken with her.  I have reviewed the labs and findings with both daughters on the phone independently.  Patient's daughter who lives in [place] will be back in town tomorrow.  We will be available to assist in caring for her mother at that time.  Consult: Reviewed with Dr. Lorin Mercy for evaluation for admission.  Patient presents with gait instability and fall in her home.  Patient lives alone and has been independent although has had problems with frequent falls.  At this time with hypokalemia, gait instability high fall risk and currently no support at home for tonight, will plan for admission for rehydration, electrolyte replacement and evaluation for gait stability and pain control for spinous process fracture.        Final Clinical Impression(s) / ED Diagnoses Final diagnoses:  Fall, initial encounter  Closed fracture of spinous process of cervical vertebra, initial encounter [facility])  Thoracic compression fracture, closed, initial encounter ([place])  Hypokalemia  Imbalance  Lip laceration, initial encounter    Rx / [place] Orders ED Discharge Orders     None         Charlesetta Shanks, MD 05/15/22 1710

## 2022-05-15 NOTE — ED Notes (Addendum)
Spoke with Dr. Nevada Crane and patient has a c-4 fx and t-2, t-3 fx. Notified Theodis Blaze, triage PA. Notified charge RN for next available room.

## 2022-06-04 ENCOUNTER — Other Ambulatory Visit: Payer: Self-pay | Admitting: Internal Medicine

## 2022-06-04 DIAGNOSIS — F419 Anxiety disorder, unspecified: Secondary | ICD-10-CM

## 2022-06-07 ENCOUNTER — Other Ambulatory Visit: Payer: Self-pay | Admitting: Internal Medicine

## 2022-06-09 ENCOUNTER — Other Ambulatory Visit: Payer: Self-pay | Admitting: Internal Medicine

## 2022-06-17 ENCOUNTER — Other Ambulatory Visit: Payer: Self-pay | Admitting: Internal Medicine

## 2022-06-19 ENCOUNTER — Other Ambulatory Visit: Payer: Self-pay | Admitting: Internal Medicine

## 2022-06-29 ENCOUNTER — Other Ambulatory Visit: Payer: Self-pay | Admitting: Internal Medicine

## 2022-06-29 DIAGNOSIS — F419 Anxiety disorder, unspecified: Secondary | ICD-10-CM

## 2022-06-30 ENCOUNTER — Other Ambulatory Visit: Payer: Self-pay | Admitting: Internal Medicine

## 2022-07-02 ENCOUNTER — Telehealth: Payer: Self-pay | Admitting: Internal Medicine

## 2022-07-02 NOTE — Telephone Encounter (Signed)
Patient would like for you to order her a cologuard kit and have it sent to her home.

## 2022-07-05 ENCOUNTER — Other Ambulatory Visit: Payer: Self-pay

## 2022-07-05 DIAGNOSIS — Z1211 Encounter for screening for malignant neoplasm of colon: Secondary | ICD-10-CM

## 2022-07-05 NOTE — Telephone Encounter (Signed)
Yes, ok sen

## 2022-07-05 NOTE — Telephone Encounter (Signed)
Ordered today 

## 2022-07-19 ENCOUNTER — Telehealth: Payer: Self-pay | Admitting: Internal Medicine

## 2022-07-19 ENCOUNTER — Telehealth: Payer: Self-pay

## 2022-07-19 NOTE — Telephone Encounter (Signed)
Nurse Assessment Nurse: Jerene Bears, RN, Will Date/Time Eilene Ghazi Time): 07/12/2022 10:40:29 PM Confirm and document reason for call. If symptomatic, describe symptoms. ---Caller reports that she fell twice and medics came to her house to help her up from the fall. Pain is "excruciating" per caller. She reports that she fell on Sat and then again on Sunday. She has not been seen by a provider since falling. The pain is midline and lower back and flank pain. New onset of urinary incontinence. Does the patient have any new or worsening symptoms? ---Yes Will a triage be completed? ---Yes Related visit to physician within the last 2 weeks? ---No Does the PT have any chronic conditions? (i.e. diabetes, asthma, this includes High risk factors for pregnancy, etc.) ---Yes List chronic conditions. ---kidney disease Is this a behavioral health or substance abuse call? ---No Guidelines Guideline Title Affirmed Question Affirmed Notes Nurse Date/Time (Eastern Time)  Back Injury  [1] SEVERE PAIN in kidney area (flank) [2] follows direct blow to that site [place], Therapist, sports, Will 07/12/2022 10:44:53 PM  Disp. Time Eilene Ghazi Time) Disposition Final User 07/12/2022 10:49:28 PM Go to ED Now Yes Jerene Bears, RN, Will Final Disposition 07/12/2022 10:49:28 PM Go to ED Now Yes Jerene Bears, RN, Will Caller Disagree/Comply Disagree Caller Understands Yes PreDisposition Did not know what to do Care Advice Given Per Guideline GO TO ED NOW: * You need to be seen in the Emergency Department. * Go to the ED at ___________ [place] now. Drive carefully. CARE ADVICE given per Back Injury (Adult) guideline. Comments User: Janeece Agee, RN Date/Time Eilene Ghazi Time): 07/12/2022 10:51:09 PM caller reports that she wait until morning to see a provider. Referrals GO TO FACILITY REFUSED

## 2022-07-19 NOTE — Telephone Encounter (Signed)
I called patient and left message for her to return call to clinic.  If she calls please transfer call to me.

## 2022-07-19 NOTE — Telephone Encounter (Signed)
LVM for pt to rtn my call to schedule Awv with [facility] call back # [phone] 

## 2022-07-21 ENCOUNTER — Other Ambulatory Visit: Payer: Self-pay | Admitting: Internal Medicine

## 2022-07-22 ENCOUNTER — Telehealth: Payer: Self-pay | Admitting: Internal Medicine

## 2022-07-22 NOTE — Telephone Encounter (Signed)
Patient has two pills out, and she needs a prior auth started.

## 2022-07-22 NOTE — Telephone Encounter (Signed)
No PA required and pharmacy able to run script back and it is ready for pick up.  Patient informed.

## 2022-07-22 NOTE — Telephone Encounter (Signed)
PT calls today in regards to their RX for QUEtiapine (SEREQUEL) 100 MG tablet. Their pharmacy had let them know that this RX is requiring more information be sent from [place] to [place] (PT could not specify but this might be in regards to a prior authorization). I had let PT know that it looked like we hadn't received this PA form thus far and that I would route a message back to Dr.Burns and staff to see if there was anything else we could do.  CB for PT: [phone]

## 2022-07-31 ENCOUNTER — Other Ambulatory Visit: Payer: Self-pay | Admitting: Internal Medicine

## 2022-07-31 DIAGNOSIS — F419 Anxiety disorder, unspecified: Secondary | ICD-10-CM

## 2022-08-02 ENCOUNTER — Encounter (HOSPITAL_COMMUNITY): Payer: Self-pay

## 2022-08-02 ENCOUNTER — Emergency Department (HOSPITAL_COMMUNITY): Payer: Medicare HMO

## 2022-08-02 ENCOUNTER — Emergency Department (HOSPITAL_COMMUNITY)
Admission: EM | Admit: 2022-08-02 | Discharge: 2022-08-02 | Disposition: A | Discharge status: Home / Self Care | Payer: Medicare HMO | Attending: Emergency Medicine | Admitting: Emergency Medicine

## 2022-08-02 ENCOUNTER — Other Ambulatory Visit: Payer: Self-pay

## 2022-08-02 DIAGNOSIS — Z743 Need for continuous supervision: Secondary | ICD-10-CM | POA: Diagnosis not present

## 2022-08-02 DIAGNOSIS — R531 Weakness: Secondary | ICD-10-CM | POA: Diagnosis not present

## 2022-08-02 DIAGNOSIS — W1830XA Fall on same level, unspecified, initial encounter: Secondary | ICD-10-CM | POA: Diagnosis not present

## 2022-08-02 DIAGNOSIS — M25512 Pain in left shoulder: Secondary | ICD-10-CM | POA: Diagnosis not present

## 2022-08-02 DIAGNOSIS — W19XXXA Unspecified fall, initial encounter: Secondary | ICD-10-CM | POA: Diagnosis not present

## 2022-08-02 DIAGNOSIS — S51011A Laceration without foreign body of right elbow, initial encounter: Secondary | ICD-10-CM | POA: Diagnosis not present

## 2022-08-02 DIAGNOSIS — M25519 Pain in unspecified shoulder: Secondary | ICD-10-CM | POA: Diagnosis not present

## 2022-08-02 DIAGNOSIS — S43015A Anterior dislocation of left humerus, initial encounter: Secondary | ICD-10-CM | POA: Insufficient documentation

## 2022-08-02 DIAGNOSIS — M778 Other enthesopathies, not elsewhere classified: Secondary | ICD-10-CM | POA: Diagnosis not present

## 2022-08-02 DIAGNOSIS — S4992XA Unspecified injury of left shoulder and upper arm, initial encounter: Secondary | ICD-10-CM | POA: Diagnosis not present

## 2022-08-02 DIAGNOSIS — S43005A Unspecified dislocation of left shoulder joint, initial encounter: Secondary | ICD-10-CM | POA: Diagnosis not present

## 2022-08-02 DIAGNOSIS — M19021 Primary osteoarthritis, right elbow: Secondary | ICD-10-CM | POA: Diagnosis not present

## 2022-08-02 DIAGNOSIS — Y92013 Bedroom of single-family (private) house as the place of occurrence of the external cause: Secondary | ICD-10-CM | POA: Insufficient documentation

## 2022-08-02 DIAGNOSIS — I10 Essential (primary) hypertension: Secondary | ICD-10-CM | POA: Diagnosis not present

## 2022-08-02 MED ORDER — PROPOFOL 10 MG/ML IV BOLUS
81.0000 mg | Freq: Once | INTRAVENOUS | Status: AC
  Administered 2022-08-02: 50 mg via INTRAVENOUS
  Filled 2022-08-02: qty 20

## 2022-08-02 MED ORDER — HYDROMORPHONE HCL 1 MG/ML IJ SOLN
1.0000 mg | Freq: Once | INTRAMUSCULAR | Status: AC
  Administered 2022-08-02: 1 mg via INTRAVENOUS
  Filled 2022-08-02: qty 1

## 2022-08-02 MED ORDER — FENTANYL CITRATE PF 50 MCG/ML IJ SOSY
75.0000 ug | PREFILLED_SYRINGE | Freq: Once | INTRAMUSCULAR | Status: AC
  Administered 2022-08-02: 75 ug via INTRAMUSCULAR
  Filled 2022-08-02: qty 2

## 2022-08-02 MED ORDER — METHOCARBAMOL 500 MG PO TABS: 500.0000 mg | ORAL_TABLET | Freq: Two times a day (BID) | ORAL | 0 refills | Status: DC

## 2022-08-02 NOTE — ED Triage Notes (Signed)
Pt reports fall in bedroom at home due to neuropathy in feet. Patient scooted self to kitchen. Pain to left shoulder with ems sling in place. Contusion with pain to right elbow.  Ems admin 167mg of fentanyl.  Pt reports hitting head, denies, blood thinner usage and denies LOC.

## 2022-08-02 NOTE — Progress Notes (Signed)
Orthopedic Tech Progress Note Patient Details:  Dawn Kim [birth year] [id number]  A shoulder reduction took place with the RN calling and requesting a shoulder immobilizer. The shoulder immobilizer was applied to the pt after the reduction and will follow up as necessary.  Ortho Devices Type of Ortho Device: Shoulder immobilizer Ortho Device/Splint Location: LUE Ortho Device/Splint Interventions: Ordered, Application, Adjustment   Post Interventions Patient Tolerated: Fair Instructions Provided: Adjustment of device, Care of device  Arville Go 08/02/2022, 12:18 PM

## 2022-08-02 NOTE — Discharge Instructions (Addendum)
Please use Tylenol or ibuprofen for pain.  You may use 600 mg ibuprofen every 6 hours or 1000 mg of Tylenol every 6 hours.  You may choose to alternate between the 2.  This would be most effective.  Not to exceed 4 g of Tylenol within 24 hours.  Not to exceed 3200 mg ibuprofen 24 hours.  You can use the muscle relaxant I am prescribing in addition to the above to help with any breakthrough pain.  You can take it up to twice daily.  It is safe to take at night, but I would be cautious taking it during the day as it can cause some drowsiness.  Make sure that you are feeling awake and alert before you get behind the wheel of a car or operate a motor vehicle.  It is not a narcotic pain medication so you are able to take it if it is not making you drowsy and still pilot a vehicle or machinery safely.  Please keep the affected extremity in the sling that we have provided other than for bathing until you are able to follow-up with orthopedics, decreasing the motion of the shoulder will decrease the chance that the shoulder dislocates again before you are able to follow-up with another provider.

## 2022-08-02 NOTE — ED Provider Triage Note (Signed)
Emergency Medicine Provider Triage Evaluation Note  Dawn Kim , a 76 y.o. female  was evaluated in triage.  Pt complains of significant pain to the left upper extremity, left shoulder, left elbow after fall earlier today due to neuropathy in feet patient received 100 mcg of fentanyl in route, reports that her pain is not improved at all, still rates 10/10.  She reports decreased range of motion of the left lower extremity, cannot touch the contralateral shoulder..  Review of Systems  Positive: Shoulder/elbow injury Negative: Numbness, tingling, head injury, blood thinners  Physical Exam  BP 133/66 (BP Location: Right Arm)   Pulse 68   Temp 97.7 F (36.5 C) (Oral)   Resp (!) 24   SpO2 100%  Gen:   Awake, no distress   Resp:  Normal effort  MSK:  Questionable deformity of the left shoulder, although patient exam is somewhat limited secondary to pain and refusal to be ranged, possible slight anterior displacement of the humeral head, tenderness extending to left elbow, no deformity noted at the elbow. Other:  intact radial, ulnar pulses on the left  Medical Decision Making  Medically screening exam initiated at 9:20 AM.  Appropriate orders placed.  Dawn Kim was informed that the remainder of the evaluation will be completed by another provider, this initial triage assessment does not replace that evaluation, and the importance of remaining in the ED until their evaluation is complete.  Workup initiated   Dawn Kim, [place] 08/02/22 3810

## 2022-08-02 NOTE — ED Provider Notes (Signed)
[facility] DEPT Provider Note   CSN: [id number] Arrival date & time: 08/02/22  0847     History  Chief Complaint  Patient presents with   Shoulder Pain   Fall    Dawn Kim is a 76 y.o. 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  She has a small scrape to the right elbow, but otherwise reports significant pain, deformity of left shoulder.  She received 100 mcg of fentanyl in route, without significant improvement in pain.  She denies any head injury, neck injury, or other pain at this time.  She does not take a blood thinner.  She denies any nausea, vomiting.  She reports questionable previous shoulder dislocation sometime in the past.   Shoulder Pain Fall       Home Medications Prior to Admission medications   Medication Sig Start Date End Date Taking? Authorizing Provider  methocarbamol (ROBAXIN) 500 MG tablet Take 1 tablet (500 mg total) by mouth 2 (two) times daily. 08/02/22  Yes Camry Robello H, PA-C  acetaminophen (TYLENOL) 325 MG tablet Take 650 mg by mouth every 6 (six) hours as needed for mild pain or moderate pain.     [provider]  ALPRAZolam (XANAX) 0.5 MG tablet TAKE 1 TABLET BY MOUTH THREE TIMES DAILY AS NEEDED FOR ANXIETY . DO NOT EXCEED 3 PER 24 HOURS 06/29/22   Burns, Claudina Lick, MD  amLODipine (NORVASC) 5 MG tablet Take 1 tablet (5 mg total) by mouth daily. 11/09/21   Binnie Rail, MD  calcium carbonate (OS-CAL - DOSED IN MG OF ELEMENTAL CALCIUM) 1250 (500 Ca) MG tablet Take 1 tablet (500 mg of elemental calcium total) by mouth daily. 05/31/16   Nche, Charlene Brooke, NP  cholecalciferol (VITAMIN D) 1000 UNITS tablet Take 1,000 Units by mouth daily.    [provider]  cilostazol (PLETAL) 50 MG tablet Take 1/2 (one-half) tablet by mouth twice daily 06/04/22   Binnie Rail, MD  famotidine (PEPCID) 40 MG tablet TAKE 1 TABLET BY  MOUTH AT BEDTIME 06/08/22   Burns, Claudina Lick, MD  ferrous sulfate 325 (65 FE) MG EC tablet Take 325 mg by mouth daily with breakfast.    [provider]  fish oil-omega-3 fatty acids 1000 MG capsule Take 1 g by mouth 2 (two) times daily.     [provider]  furosemide (LASIX) 20 MG tablet Take 1 tablet (20 mg total) by mouth daily. 03/02/22   Binnie Rail, MD  levocetirizine (XYZAL) 5 MG tablet TAKE 1 TABLET BY MOUTH ONCE DAILY IN THE EVENING 04/27/22   Binnie Rail, MD  levothyroxine (SYNTHROID) 112 MCG tablet Take 1 tablet by mouth once daily 02/15/22   Binnie Rail, MD  Multiple Vitamin (MULTIVITAMIN) tablet Take 1 tablet by mouth daily.    [provider]  omeprazole (PRILOSEC) 40 MG capsule TAKE 1 CAPSULE BY MOUTH TWICE DAILY BEFORE A MEAL 06/17/22   Burns, Claudina Lick, MD  potassium chloride (KLOR-CON M) 10 MEQ tablet Take 2 tablets by mouth once daily 06/29/22   Binnie Rail, MD  QUEtiapine (SEROQUEL) 100 MG tablet TAKE 1 TABLET BY MOUTH AT BEDTIME 07/21/22   Binnie Rail, MD  simvastatin (ZOCOR) 40 MG tablet Take 1 tablet by mouth in the evening 06/09/22   Binnie Rail, MD  venlafaxine XR (EFFEXOR-XR) 150 MG 24 hr capsule Take 1 capsule (150  mg total) by mouth daily with breakfast. 04/28/22   Binnie Rail, MD  venlafaxine XR (EFFEXOR-XR) 75 MG 24 hr capsule TAKE 1 CAPSULE BY MOUTH ONCE DAILY WITH BREAKFAST OF 225 MG DAILY 04/26/22   Binnie Rail, MD      Allergies    Penicillins, Eszopiclone, Gabapentin, Sonata [zaleplon], Ambien [zolpidem], and Amoxicillin    Review of Systems   Review of Systems  Musculoskeletal:  Positive for arthralgias and joint swelling.  All other systems reviewed and are negative.   Physical Exam Updated Vital Signs BP 125/78   Pulse 62   Temp 98 F (36.7 C) (Oral)   Resp 18   SpO2 99%  Physical Exam Vitals and nursing note reviewed.  Constitutional:      General: She is not in acute distress.    Appearance: Normal  appearance.  HENT:     Head: Normocephalic and atraumatic.  Eyes:     General:        Right eye: No discharge.        Left eye: No discharge.  Cardiovascular:     Rate and Rhythm: Normal rate and regular rhythm.  Pulmonary:     Effort: Pulmonary effort is normal. No respiratory distress.  Musculoskeletal:        General: Deformity and signs of injury present.     Comments: Patient with anterior displacement of humeral head, inability to perform crossarm adduction, and significant tenderness as well as visual deformity over the left humeral head.  No evidence of step-off, deformity of the humerus, left elbow.  Skin:    General: Skin is warm and dry.     Comments: Minor contusion, skin scrapes over right shoulder  Neurological:     Mental Status: She is alert and oriented to person, place, and time.  Psychiatric:        Mood and Affect: Mood normal.        Behavior: Behavior normal.     ED Results / Procedures / Treatments   Labs (all labs ordered are listed, but only abnormal results are displayed) Labs Reviewed - No data to display  EKG None  Radiology DG Shoulder Left Portable  Result Date: 08/02/2022 CLINICAL DATA:  Post reduction LEFT shoulder EXAM: LEFT SHOULDER COMPARISON:  Earlier study 08/02/2022, 10/05/2021 FINDINGS: Previously identified anterior inferior LEFT glenohumeral dislocation reduced. Chronic osseous fragment again seen, unchanged since 10/05/2021. AC joint alignment normal. Bones demineralized. IMPRESSION: Reduction of previously identified LEFT glenohumeral dislocation. Electronically Signed   By: Lavonia Dana M.D.   On: 08/02/2022 12:22   DG Elbow 2 Views Right  Result Date: 08/02/2022 CLINICAL DATA:  Fall and left shoulder pain. Small cuts of the right elbow. EXAM: RIGHT ELBOW - 2 VIEW COMPARISON:  None Available. FINDINGS: Right elbow is located without acute fracture or large joint effusion. Spurring and degenerative changes involving the coronoid  process of the ulna. Normal alignment at the elbow. Mild enthesopathic changes involving the olecranon. Peripheral IV in the antecubital region. IMPRESSION: 1. No acute bone abnormality to the right elbow. 2. Degenerative changes involving the coronoid process of the ulna. Electronically Signed   By: Markus Daft M.D.   On: 08/02/2022 10:31   DG Shoulder Left  Result Date: 08/02/2022 CLINICAL DATA:  Fall and left shoulder pain. EXAM: LEFT SHOULDER - 2+ VIEW COMPARISON:  10/05/2021 FINDINGS: Anterior left shoulder dislocation. Again noted is a chronic bone fragment just lateral to the superior aspect of the left glenoid. Stable  alignment at the left [facility] joint. Proximal left humerus is intact. IMPRESSION: Left shoulder anterior dislocation. Electronically Signed   By: Markus Daft M.D.   On: 08/02/2022 10:29    Procedures Reduction of dislocation  Date/Time: 08/02/2022 2:06 PM  Performed by: Anselmo Pickler, PA-C Authorized by: Anselmo Pickler, PA-C  Consent: Verbal consent obtained. Written consent obtained. Risks and benefits: risks, benefits and alternatives were discussed Consent given by: patient Patient understanding: patient states understanding of the procedure being performed Patient consent: the patient's understanding of the procedure matches consent given Procedure consent: procedure consent matches procedure scheduled Relevant documents: relevant documents present and verified Test results: test results available and properly labeled Site marked: the operative site was marked Imaging studies: imaging studies available Patient identity confirmed: verbally with patient and arm band Time out: Immediately prior to procedure a "time out" was called to verify the correct patient, procedure, equipment, support staff and site/side marked as required. Preparation: Patient was prepped and draped in the usual sterile fashion. Local anesthesia used: no  Anesthesia: Local anesthesia  used: no  Sedation: Patient sedated: yes Sedation type: moderate (conscious) sedation Sedatives: propofol Analgesia: hydromorphone Sedation start date/time: 08/02/2022 11:58 AM Sedation end date/time: 08/02/2022 12:10 PM Vitals: Vital signs were monitored during sedation.  Patient tolerance: patient tolerated the procedure well with no immediate complications       Medications Ordered in ED Medications  fentaNYL (SUBLIMAZE) injection 75 mcg (75 mcg Intramuscular Given 08/02/22 0930)  HYDROmorphone (DILAUDID) injection 1 mg (1 mg Intravenous Given 08/02/22 1036)  HYDROmorphone (DILAUDID) injection 1 mg (1 mg Intravenous Given 08/02/22 1124)  propofol (DIPRIVAN) 10 mg/mL bolus/IV push 81 mg (50 mg Intravenous Given 08/02/22 1158)    ED Course/ Medical Decision Making/ A&P                           Medical Decision Making Amount and/or Complexity of Data Reviewed Radiology: ordered.  Risk Prescription drug management.   This is a patient who arrives in some distress with obvious deformity of left shoulder.  I have some concern for acute left shoulder dislocation plus or minus fracture, as well as additional injury to the right elbow, she did not have any head injury, low clinical suspicion for occult head or cervical spinal injury.  She does not take any blood thinners and she can accurately describe her fall.  I independently interpreted imaging including plain film of the left shoulder, right elbow which shows acute anterior dislocation of the left shoulder, no acute fracture or dislocation of the right elbow. I agree with the radiologist interpretation.  Patient with radiographic confirmation of left shoulder dislocation, we discussed methods for reduction including pain control, intra articular lidocaine, versus conscious sedation.  This patient is in considerable pain even after fentanyl x2 Dilaudid x1, she reports that she does not think that she can tolerate a nonsedated  attempt at reduction.  Reduction performed as above without significant difficulty, used FARES method.  Felt a audible clunk, patient with improved visualization of the anterior shoulder after successful reduction, performed radiographic imaging to confirm reduction, which shows successful reduction of left shoulder, radiologist agrees with this interpretation.  Patient discharged in stable condition at this time with sling, muscle relaxant, and close orthopedic follow-up.  Patient understands and agrees to plan. Final Clinical Impression(s) / ED Diagnoses Final diagnoses:  Fall, initial encounter  Anterior dislocation of left shoulder, initial encounter  Rx / DC Orders ED Discharge Orders          Ordered    methocarbamol (ROBAXIN) 500 MG tablet  2 times daily        08/02/22 1319              Jamile Sivils, [place], PA-C 08/02/22 1410    Gareth Morgan, MD 08/03/22 1040

## 2022-08-03 ENCOUNTER — Telehealth: Payer: Self-pay | Admitting: Internal Medicine

## 2022-08-03 DIAGNOSIS — F419 Anxiety disorder, unspecified: Secondary | ICD-10-CM

## 2022-08-03 MED ORDER — ALPRAZOLAM 0.5 MG PO TABS: ORAL_TABLET | ORAL | 0 refills | Status: DC

## 2022-08-03 NOTE — ED Provider Notes (Signed)
Past Medical History:  Diagnosis Date   Anxiety    Cataract    Bil   Depression    Hypercholesteremia    Hypertension    Thyroid disease     Physical Exam  BP 125/78   Pulse 62   Temp 98 F (36.7 C) (Oral)   Resp 18   SpO2 99%   Physical Exam  Procedures  .Sedation  Date/Time: 08/03/2022 10:36 AM  Performed by: Gareth Morgan, MD Authorized by: Gareth Morgan, MD   Consent:    Consent obtained:  Verbal   Consent given by:  Patient   Risks discussed:  Allergic reaction, dysrhythmia, inadequate sedation, nausea, respiratory compromise necessitating ventilatory assistance and intubation, prolonged sedation necessitating reversal, prolonged hypoxia resulting in organ damage and vomiting   Alternatives discussed:  Analgesia without sedation Universal protocol:    Immediately prior to procedure, a time out was called: yes     Patient identity confirmed:  Verbally with patient Indications:    Procedure performed:  Dislocation reduction   Procedure necessitating sedation performed by:  Physician performing sedation Pre-sedation assessment:    Time since last food or drink:  4   ASA classification: class 2 - patient with mild systemic disease     Mallampati score:  II - soft palate, uvula, fauces visible   Pre-sedation assessments completed and reviewed: airway patency, cardiovascular function, hydration status, mental status, nausea/vomiting and pain level   Immediate pre-procedure details:    Reassessment: Patient reassessed immediately prior to procedure     Verified: bag valve mask available, emergency equipment available, intubation equipment available, IV patency confirmed, oxygen available and suction available   Procedure details (see MAR for exact dosages):    Preoxygenation:  Nasal cannula   Sedation:  Propofol   Intended level of sedation: deep and moderate (conscious sedation)   Intra-procedure monitoring:  Blood pressure monitoring, cardiac monitor,  continuous capnometry, continuous pulse oximetry, frequent LOC assessments and frequent vital sign checks   Intra-procedure events: none     Total Provider sedation time (minutes):  15 Post-procedure details:    Procedure completion:  Tolerated well, no immediate complications Comments:     27mg propofol, adequate sedation, no complications   ED Course / MDM    Medical Decision Making Amount and/or Complexity of Data Reviewed Radiology: ordered.  Risk Prescription drug management.   Shared visit for shoulder dislocation with reduction performed by APP and sedation performed by me.        SGareth Morgan MD 08/03/22 1040

## 2022-08-03 NOTE — Telephone Encounter (Signed)
sent 

## 2022-08-03 NOTE — Telephone Encounter (Signed)
Patient Called checking status of refill request:  Call Back Number:  [phone]  Date of Last Office Visit:  03/12/22   Needs awv scheduled  Date of Next Office Visit: 09/03/22  Medication(s) to be Refilled:   ALPRAZOLAM 0.5 MG tablet. Take no more than 3 daily  Preferred Pharmacy:  [facility], [place]  [address], [place] [address]  Phone:  [phone]  Fax:  [phone]   ** Please notify patient to allow 48-72 hours to process** **Let patient know to contact pharmacy at the end of the day to make sure medication is ready. ** **If patient has not been seen in a year or longer, book an appointment **Advise to use MyChart for refill requests OR to contact their pharmacy

## 2022-08-05 NOTE — Telephone Encounter (Signed)
Transition Care Management Follow-up Telephone Call Date of discharge and from where: October 30th, 2023. [facility] How have you been since you were released from the hospital? Patient reports should is still sore but that she is doing okay. Any questions or concerns? No  Items Reviewed: Did the pt receive and understand the discharge instructions provided? Yes  Medications obtained and verified? Yes  Other? No  Any new allergies since your discharge? No  Dietary orders reviewed? No Do you have support at home? Yes   Home Care and Equipment/Supplies: Were home health services ordered? no If so, what is the name of the agency? N/A  Has the agency set up a time to come to the patient's home? not applicable Were any new equipment or medical supplies ordered?  No What is the name of the medical supply agency? N/A Were you able to get the supplies/equipment? not applicable Do you have any questions related to the use of the equipment or supplies? No  Functional Questionnaire: (I = Independent and D = Dependent) ADLs: I  Bathing/Dressing- I  Meal Prep- I  Eating- I  Maintaining continence-I  Transferring/Ambulation- I  Managing Meds- I  Follow up appointments reviewed:  PCP Hospital f/u appt confirmed? No  Patient to follow up with [facility] f/u appt confirmed? No  Patient encouraged to call Dr. Luanna Cole office for follow up per instructions on hospital discharge summary Are transportation arrangements needed? No  If their condition worsens, is the pt aware to call PCP or go to the Emergency Dept.? Yes Was the patient provided with contact information for the PCP's office or ED? Yes Was to pt encouraged to call back with questions or concerns? Yes

## 2022-08-24 ENCOUNTER — Telehealth: Payer: Self-pay | Admitting: Internal Medicine

## 2022-08-24 NOTE — Telephone Encounter (Signed)
Lvm for pt to rtn my call to schedule AWV with [facility] call back # [phone]

## 2022-08-24 NOTE — Telephone Encounter (Signed)
Pt called to say she will keep her appt with Dr. Quay Burow on 09/03/22 and decide from there when she will schedule the AWV appt.

## 2022-08-27 ENCOUNTER — Other Ambulatory Visit: Payer: Self-pay | Admitting: Internal Medicine

## 2022-08-27 DIAGNOSIS — F419 Anxiety disorder, unspecified: Secondary | ICD-10-CM

## 2022-09-03 ENCOUNTER — Ambulatory Visit: Payer: Medicare Other | Admitting: Internal Medicine

## 2022-09-06 ENCOUNTER — Other Ambulatory Visit: Payer: Self-pay | Admitting: Internal Medicine

## 2022-09-09 ENCOUNTER — Other Ambulatory Visit: Payer: Self-pay | Admitting: Internal Medicine

## 2022-09-09 ENCOUNTER — Other Ambulatory Visit: Payer: Self-pay

## 2022-09-11 ENCOUNTER — Other Ambulatory Visit: Payer: Self-pay | Admitting: Internal Medicine

## 2022-09-21 ENCOUNTER — Other Ambulatory Visit: Payer: Self-pay | Admitting: Internal Medicine

## 2022-09-21 ENCOUNTER — Telehealth: Payer: Self-pay | Admitting: Internal Medicine

## 2022-09-21 DIAGNOSIS — F419 Anxiety disorder, unspecified: Secondary | ICD-10-CM

## 2022-09-21 NOTE — Telephone Encounter (Signed)
Called and left message for patient.  30 day supply on file with pharmacy and she will need to pay for medication out of pocket until we see her next month to discuss.

## 2022-09-21 NOTE — Telephone Encounter (Signed)
Do not want her increasing her medication on her own.  She needs to stick with the 100 mg dose until she comes in for her next appointment.  If needed she can use GoodRx to purchase a 1 month supply

## 2022-09-21 NOTE — Telephone Encounter (Signed)
Rye from [facility] called stating the patient needs a refill on her Quetiapine (Seroquel) '100mg'$ . He states she is about a month early on her prescription because she was taking more than she was supposed too, stating when she only takes one it doesn't seem to work or help. He would like to know if Dr. Quay Burow wants to increase her dosage, change medications or have her wait until her refill is ready. He would like a callback to discuss, the best callback number is [phone].

## 2022-09-26 ENCOUNTER — Other Ambulatory Visit: Payer: Self-pay | Admitting: Internal Medicine

## 2022-10-15 ENCOUNTER — Other Ambulatory Visit: Payer: Self-pay | Admitting: Internal Medicine

## 2022-10-24 ENCOUNTER — Other Ambulatory Visit: Payer: Self-pay | Admitting: Internal Medicine

## 2022-10-24 DIAGNOSIS — F419 Anxiety disorder, unspecified: Secondary | ICD-10-CM

## 2022-10-26 ENCOUNTER — Other Ambulatory Visit: Payer: Self-pay | Admitting: Internal Medicine

## 2022-10-26 ENCOUNTER — Other Ambulatory Visit: Payer: Self-pay

## 2022-11-03 ENCOUNTER — Ambulatory Visit: Payer: Medicare Other | Admitting: Internal Medicine

## 2022-11-09 ENCOUNTER — Other Ambulatory Visit: Payer: Self-pay | Admitting: Internal Medicine

## 2022-11-10 ENCOUNTER — Encounter (HOSPITAL_COMMUNITY): Payer: Self-pay

## 2022-11-10 ENCOUNTER — Emergency Department (HOSPITAL_COMMUNITY): Payer: Medicare HMO

## 2022-11-10 ENCOUNTER — Other Ambulatory Visit: Payer: Self-pay

## 2022-11-10 ENCOUNTER — Emergency Department (HOSPITAL_COMMUNITY)
Admission: EM | Admit: 2022-11-10 | Discharge: 2022-11-10 | Disposition: A | Discharge status: Home / Self Care | Payer: Medicare HMO | Attending: Emergency Medicine | Admitting: Emergency Medicine

## 2022-11-10 ENCOUNTER — Telehealth: Payer: Self-pay

## 2022-11-10 DIAGNOSIS — I10 Essential (primary) hypertension: Secondary | ICD-10-CM | POA: Diagnosis not present

## 2022-11-10 DIAGNOSIS — E876 Hypokalemia: Secondary | ICD-10-CM | POA: Diagnosis not present

## 2022-11-10 DIAGNOSIS — D72829 Elevated white blood cell count, unspecified: Secondary | ICD-10-CM | POA: Diagnosis not present

## 2022-11-10 DIAGNOSIS — Y92002 Bathroom of unspecified non-institutional (private) residence single-family (private) house as the place of occurrence of the external cause: Secondary | ICD-10-CM | POA: Insufficient documentation

## 2022-11-10 DIAGNOSIS — R7309 Other abnormal glucose: Secondary | ICD-10-CM | POA: Diagnosis not present

## 2022-11-10 DIAGNOSIS — S43005A Unspecified dislocation of left shoulder joint, initial encounter: Secondary | ICD-10-CM

## 2022-11-10 DIAGNOSIS — S0990XA Unspecified injury of head, initial encounter: Secondary | ICD-10-CM | POA: Diagnosis not present

## 2022-11-10 DIAGNOSIS — J209 Acute bronchitis, unspecified: Secondary | ICD-10-CM | POA: Insufficient documentation

## 2022-11-10 DIAGNOSIS — S43015A Anterior dislocation of left humerus, initial encounter: Secondary | ICD-10-CM | POA: Diagnosis not present

## 2022-11-10 DIAGNOSIS — W01198A Fall on same level from slipping, tripping and stumbling with subsequent striking against other object, initial encounter: Secondary | ICD-10-CM | POA: Insufficient documentation

## 2022-11-10 DIAGNOSIS — Z79899 Other long term (current) drug therapy: Secondary | ICD-10-CM | POA: Insufficient documentation

## 2022-11-10 DIAGNOSIS — S4992XA Unspecified injury of left shoulder and upper arm, initial encounter: Secondary | ICD-10-CM | POA: Diagnosis present

## 2022-11-10 LAB — CBC WITH DIFFERENTIAL/PLATELET
Abs Immature Granulocytes: 0.07 10*3/uL (ref 0.00–0.07)
Basophils Absolute: 0 10*3/uL (ref 0.0–0.1)
Basophils Relative: 0 %
Eosinophils Absolute: 0.1 10*3/uL (ref 0.0–0.5)
Eosinophils Relative: 1 %
HCT: 39.7 % (ref 36.0–46.0)
Hemoglobin: 12.3 g/dL (ref 12.0–15.0)
Immature Granulocytes: 1 %
Lymphocytes Relative: 11 %
Lymphs Abs: 1.3 10*3/uL (ref 0.7–4.0)
MCH: 30.3 pg (ref 26.0–34.0)
MCHC: 31 g/dL (ref 30.0–36.0)
MCV: 97.8 fL (ref 80.0–100.0)
Monocytes Absolute: 0.7 10*3/uL (ref 0.1–1.0)
Monocytes Relative: 6 %
Neutro Abs: 9.3 10*3/uL — ABNORMAL HIGH (ref 1.7–7.7)
Neutrophils Relative %: 81 %
Platelets: 211 10*3/uL (ref 150–400)
RBC: 4.06 MIL/uL (ref 3.87–5.11)
RDW: 12.7 % (ref 11.5–15.5)
WBC: 11.4 10*3/uL — ABNORMAL HIGH (ref 4.0–10.5)
nRBC: 0 % (ref 0.0–0.2)

## 2022-11-10 LAB — BASIC METABOLIC PANEL
Anion gap: 10 (ref 5–15)
BUN: 13 mg/dL (ref 8–23)
CO2: 27 mmol/L (ref 22–32)
Calcium: 8.5 mg/dL — ABNORMAL LOW (ref 8.9–10.3)
Chloride: 102 mmol/L (ref 98–111)
Creatinine, Ser: 0.91 mg/dL (ref 0.44–1.00)
GFR, Estimated: >60 mL/min (ref >60)
Glucose, Bld: 141 mg/dL — ABNORMAL HIGH (ref 70–99)
Potassium: 3.4 mmol/L — ABNORMAL LOW (ref 3.5–5.1)
Sodium: 139 mmol/L (ref 135–145)

## 2022-11-10 MED ORDER — FENTANYL CITRATE PF 50 MCG/ML IJ SOSY
50.0000 ug | PREFILLED_SYRINGE | Freq: Once | INTRAMUSCULAR | Status: AC
  Administered 2022-11-10: 50 ug via INTRAVENOUS
  Filled 2022-11-10: qty 1

## 2022-11-10 MED ORDER — ALBUTEROL SULFATE HFA 108 (90 BASE) MCG/ACT IN AERS
1.0000 | INHALATION_SPRAY | Freq: Once | RESPIRATORY_TRACT | Status: AC
  Administered 2022-11-10: 2 via RESPIRATORY_TRACT
  Filled 2022-11-10: qty 6.7

## 2022-11-10 MED ORDER — PREDNISONE 10 MG PO TABS: 20.0000 mg | ORAL_TABLET | Freq: Every day | ORAL | 0 refills | Status: AC

## 2022-11-10 MED ORDER — DOXYCYCLINE HYCLATE 100 MG PO CAPS: 100.0000 mg | ORAL_CAPSULE | Freq: Two times a day (BID) | ORAL | 0 refills | Status: AC

## 2022-11-10 MED ORDER — FENTANYL CITRATE PF 50 MCG/ML IJ SOSY
25.0000 ug | PREFILLED_SYRINGE | Freq: Once | INTRAMUSCULAR | Status: AC
  Administered 2022-11-10: 25 ug via INTRAVENOUS
  Filled 2022-11-10: qty 1

## 2022-11-10 MED ORDER — HYDROMORPHONE HCL 1 MG/ML IJ SOLN
0.5000 mg | Freq: Once | INTRAMUSCULAR | Status: AC
  Administered 2022-11-10: 0.5 mg via INTRAVENOUS
  Filled 2022-11-10: qty 1

## 2022-11-10 NOTE — ED Provider Notes (Signed)
Reduction of dislocation  Date/Time: 11/10/2022 8:06 AM  Performed by: Deno Etienne, DO Authorized by: Deno Etienne, DO  Consent: Verbal consent obtained. Consent given by: patient Patient understanding: patient states understanding of the procedure being performed Imaging studies: imaging studies available Patient identity confirmed: verbally with patient Time out: Immediately prior to procedure a "time out" was called to verify the correct patient, procedure, equipment, support staff and site/side marked as required. Local anesthesia used: no  Anesthesia: Local anesthesia used: no  Sedation: Patient sedated: no  Patient tolerance: patient tolerated the procedure well with no immediate complications       Deno Etienne, DO 11/10/22 5329

## 2022-11-10 NOTE — Progress Notes (Addendum)
Transition of Care [facility]) - Emergency Department Mini Assessment   Patient Details  Name: Dawn Kim MRN: [id number] Date of Birth: [birth year]  [facility] CM/SW Contact:    Kimber Relic, LCSW Phone Number: 11/10/2022, 11:47 AM   Clinical Narrative: RN contacted this writer to inform family is wanting to speak with TOC. This Probation officer spoke with the pt's daughter, Dawn Kim, who states the pt is needing home health - not rehab. Daughter reports she encourages her mom to keep her cane near the bed, remove rugs, etc. Pt states her mother has not listened to her or any of her recommendations. This CSW informed we can provide resources for in home/elder care. This CSW has added resources to AVS. Agency for [place] will be [facility]. Daughter notified. This CSW also notified EDPs (MD/PA) via secure chat. No further TOC needs.   Addend @ 12:13 PM Spoke with PA who expressed concern about pt being d/c home with [facility] versus short term rehab. PA feels patient would benefit from rehab given recent falls, including here at the hospital. This CSW informed that the pt's daughter, Dawn Kim, was adamant about not wanting rehab and even mentioned that "getting her to go to rehab would be like pulling teeth." Encouraged provider to speak with the pt and family from a provider/medical standpoint. Informed that daughter also mentioned she was under the impression that the pt is ready to go. TOC will work pt up for SNF if patient and family changes their mind. TOC following.  Addend @ 12:35 PM Noted PA's addendum; it appears patient and family still did not want rehab. Pt was discharged. PA states she was stable for discharge at this time.   ED Mini Assessment: What brought you to the Emergency Department? : fall  Barriers to Discharge: No Barriers Identified     Means of departure: Car       Patient Contact and Communications Key Contact 1: Museum/gallery conservator (daughter)   Spoke with: Museum/gallery conservator  (daughter) Contact Date: 11/10/22,   Contact time: 1105 Contact Phone Number: [phone] Call outcome: Family only wanting home health services - not rehab.         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[place]) 12/09/2016   Osteopenia 06/21/2016   Bilateral leg edema 05/31/2016   Hypothyroidism 10/22/2015   Anxiety and depression 10/22/2015   Anxiety 10/22/2015   GERD (gastroesophageal reflux disease) 10/22/2015   Insomnia 10/22/2015   Bilateral foot pain 10/22/2015   Bilateral knee pain 10/22/2015   HTN (hypertension) 01/20/2012   Hypercholesterolemia 01/20/2012   PCP:  Binnie Rail, MD Pharmacy:   [facility] ORDER) [place], [place] [address] Phone: [phone] Fax: [phone]  [place], [place] [address] Phone: [phone] Fax: [phone]

## 2022-11-10 NOTE — ED Provider Notes (Addendum)
Accepted handoff at shift change from [facility] PA-C. Please see prior provider note for more detail.   Briefly: Patient is 77 y.o.   DDX: concern for patient with fall at home, she had a left shoulder dislocation, she is being treated for acute bronchitis, at time my evaluation her workup is overall complete, however she is handed off with plan to ambulate to see if she is safe for discharge home.  With assistance patient is able to ambulate with some difficulty but she is somewhat unsteady on her feet   Plan: consult PT, home health face to face, daughter will pick her up around noon today, from medical perspective I think that she is safe to go home although I have some concerns about her overall ability to take care of herself at home alone given her advanced, and recent falls, now with 1 restricted limb.  I do think that rehab or inpatient facility would be most appropriate, but after discussion with daughter and will try home health or other options prior to forcing her into an inpatient facility as she does not want to go at this time.      RISR  EDTHIS    Dawn Pickler, PA-C 11/10/22 1610    Dawn Etienne, DO 11/10/22 9604  Attempted to discharge into care of of patient's daughter, she had another slow slip and fall to the ground shortly prior to attempted discharge into daughter's care, after further discussion with daughter it does not seem like she is able to stay at home with her mother, mother is refusing to use assistance to walk on her own, she has decreased ability to use cane, walker, consult to PT, OT, as well as social work placed.  After fall reveals no new traumatic injuries, she continues to have baseline mental status, no new numbness, weakness, she has no new cut or other injury.  Patient had formal evaluation by social work about options for home health face-to-face PT OT, versus inpatient rehab, or SNF, I have retracted conversation with the patient  and her daughter, they do not wish for any long-term care, they do not wish to stay for additional PT evaluation, and feel comfortable with patient discharged home with modifications of some of her walking precautions at the home.  She was able to ambulate back and forth in the room with her normal one-handed cane assistance, and she was able to rise and transfer without significant difficulty with only minimal assistance of the right hand.  I think that she is stable for discharge at this time although I did encourage close discussion of long-term care facility with her PCP or return to the emergency department if any ongoing concerns for her ability to ambulate.   Dawn Pickler, PA-C 11/10/22 [place], [place], DO 11/10/22 1457

## 2022-11-10 NOTE — ED Provider Notes (Signed)
[place] [facility] AT [facility] Provider Note   CSN: [id number] Arrival date & time: 11/10/22  0411     History  Chief Complaint  Patient presents with   Fall   Shoulder Pain    Dawn Kim is a 77 y.o. female.  HPI   Medical history including hypertension, depression, presents after a mechanical fall.  Patient states that trying To use the restroom unfortunately tripped over the carpet, states she fell onto her left side, she states she did hit her head but denies loss of conscious, states that she has severe pain in her left shoulder, she does not endorse any complaints, denies any neck pain back pain chest pain abdominal pain pain in the other 3 extremities.  Patient is currently not on any anticoag's.    Home Medications Prior to Admission medications   Medication Sig Start Date End Date Taking? Authorizing Provider  doxycycline (VIBRAMYCIN) 100 MG capsule Take 1 capsule (100 mg total) by mouth 2 (two) times daily for 7 days. 11/10/22 11/17/22 Yes Marcello Fennel, PA-C  furosemide (LASIX) 20 MG tablet Take 1 tablet by mouth once daily 10/26/22   Binnie Rail, MD  potassium chloride (KLOR-CON M) 10 MEQ tablet Take 2 tablets by mouth once daily 10/26/22   Binnie Rail, MD  predniSONE (DELTASONE) 10 MG tablet Take 2 tablets (20 mg total) by mouth daily for 5 days. 11/10/22 11/15/22 Yes Marcello Fennel, PA-C  acetaminophen (TYLENOL) 325 MG tablet Take 650 mg by mouth every 6 (six) hours as needed for mild pain or moderate pain.     [provider]  ALPRAZolam (XANAX) 0.5 MG tablet TAKE 1 TABLET BY MOUTH THREE TIMES DAILY AS NEEDED FOR ANXIETY . DO NOT EXCEED 3 PER 24 HOURS ** DO NOT FILL EARLY** 10/25/22   Binnie Rail, MD  amLODipine (NORVASC) 5 MG tablet Take 1 tablet by mouth once daily 10/15/22   Binnie Rail, MD  calcium carbonate (OS-CAL - DOSED IN MG OF ELEMENTAL CALCIUM) 1250 (500 Ca) MG tablet Take 1 tablet (500 mg of elemental  calcium total) by mouth daily. 05/31/16   Nche, Charlene Brooke, NP  cholecalciferol (VITAMIN D) 1000 UNITS tablet Take 1,000 Units by mouth daily.    [provider]  cilostazol (PLETAL) 50 MG tablet Take 0.5 tablets (25 mg total) by mouth 2 (two) times daily. Annual appt due in May must see provider for future refills 11/09/22   Binnie Rail, MD  famotidine (PEPCID) 40 MG tablet TAKE 1 TABLET BY MOUTH AT BEDTIME 09/09/22   Binnie Rail, MD  ferrous sulfate 325 (65 FE) MG EC tablet Take 325 mg by mouth daily with breakfast.    [provider]  fish oil-omega-3 fatty acids 1000 MG capsule Take 1 g by mouth 2 (two) times daily.     [provider]  levocetirizine (XYZAL) 5 MG tablet TAKE 1 TABLET BY MOUTH ONCE DAILY IN THE EVENING 08/30/22   Binnie Rail, MD  levothyroxine (SYNTHROID) 112 MCG tablet Take 1 tablet by mouth once daily 08/30/22   Binnie Rail, MD  methocarbamol (ROBAXIN) 500 MG tablet Take 1 tablet (500 mg total) by mouth 2 (two) times daily. 08/02/22   Prosperi, Christian H, PA-C  Multiple Vitamin (MULTIVITAMIN) tablet Take 1 tablet by mouth daily.    [provider]  omeprazole (PRILOSEC) 40 MG capsule TAKE 1 CAPSULE BY MOUTH TWICE DAILY BEFORE A MEAL  08/30/22   Binnie Rail, MD  QUEtiapine (SEROQUEL) 100 MG tablet TAKE 1 TABLET BY MOUTH AT BEDTIME 09/06/22   Binnie Rail, MD  simvastatin (ZOCOR) 40 MG tablet Take 1 tablet by mouth in the evening 08/30/22   Binnie Rail, MD  venlafaxine XR (EFFEXOR-XR) 150 MG 24 hr capsule TAKE 1 CAPSULE BY MOUTH ONCE DAILY WITH BREAKFAST 08/30/22   Binnie Rail, MD  venlafaxine XR (EFFEXOR-XR) 75 MG 24 hr capsule TAKE 1 CAPSULE BY MOUTH ONCE DAILY WITH BREAKFAST OF 225 MG DAILY 04/26/22   Binnie Rail, MD      Allergies    Penicillins, Eszopiclone, Gabapentin, Sonata [zaleplon], Ambien [zolpidem], and Amoxicillin    Review of Systems   Review of Systems  Constitutional:  Negative for chills and  fever.  Respiratory:  Negative for shortness of breath.   Cardiovascular:  Negative for chest pain.  Gastrointestinal:  Negative for abdominal pain.  Musculoskeletal:        Left shoulder pain  Neurological:  Negative for headaches.    Physical Exam Updated Vital Signs BP (!) 152/94 (BP Location: Right Arm)   Pulse 78   Resp 18   SpO2 95%  Physical Exam Vitals and nursing note reviewed.  Constitutional:      General: She is not in acute distress.    Appearance: She is not ill-appearing.  HENT:     Head: Normocephalic and atraumatic.     Comments: There is no deformity of the head present no raccoon eyes or Battle sign noted.    Nose: No congestion.     Mouth/Throat:     Mouth: Mucous membranes are dry.     Pharynx: Oropharynx is clear.  Eyes:     Extraocular Movements: Extraocular movements intact.     Conjunctiva/sclera: Conjunctivae normal.  Cardiovascular:     Rate and Rhythm: Normal rate and regular rhythm.     Pulses: Normal pulses.     Heart sounds: No murmur heard.    No friction rub. No gallop.  Pulmonary:     Effort: No respiratory distress.     Breath sounds: Wheezing present. No rhonchi or rales.     Comments: Chest is nontender to palpation, no evidence of respiratory distress, patient has slight wheezing present on exam no rales or rhonchi present. Abdominal:     Palpations: Abdomen is soft.     Tenderness: There is no abdominal tenderness. There is no right CVA tenderness or left CVA tenderness.     Comments: Abdomen is soft nontender to palpation.  Musculoskeletal:     Comments: Spine was palpated, she had tenderness noted along her entire spine, no crepitus deformities noted, no pelvis instability no leg shortening.  Patient is able to move her lower extremities without difficulty, she is moving her right upper arm without difficulty, patient has difficulty moving at her left shoulder due to pain, she had point tenderness on her anterior aspect of her  deltoid, she is moving her fingers wrist and elbow compartments are soft she has 2+ radial pulses 2-second capillary refill.  Skin:    General: Skin is warm and dry.  Neurological:     Mental Status: She is alert.     Comments: No facial asymmetry no difficulty with word finding following two-step commands there is no unilateral weakness present.  Psychiatric:        Mood and Affect: Mood normal.     ED Results / Procedures / Treatments  Labs (all labs ordered are listed, but only abnormal results are displayed) Labs Reviewed  BASIC METABOLIC PANEL - Abnormal; Notable for the following components:      Result Value   Potassium 3.4 (*)    Glucose, Bld 141 (*)    Calcium 8.5 (*)    All other components within normal limits  CBC WITH DIFFERENTIAL/PLATELET - Abnormal; Notable for the following components:   WBC 11.4 (*)    Neutro Abs 9.3 (*)    All other components within normal limits    EKG None  Radiology DG Shoulder Left  Result Date: 11/10/2022 CLINICAL DATA:  Post reduction film. EXAM: LEFT SHOULDER - 2+ VIEW COMPARISON:  November 10, 2018 for a 5:40 a.m. and August 02, 2022 FINDINGS: An anterior dislocation remains in the right shoulder. Deformity of the lateral humeral head is essentially stable since August 02, 2022 consistent with a Hill-Sachs deformity. A bone fragment just lateral to the superior aspect of the glenoid is unchanged and nonacute. No other acute bony abnormalities. IMPRESSION: An anterior dislocation remains in the right shoulder. Deformity of the lateral humeral head consistent with a Hill-Sachs deformity is unchanged as are bony fragments lateral to the superior glenoid. Electronically Signed   By: Dorise Bullion III M.D.   On: 11/10/2022 07:28   CT Lumbar Spine Wo Contrast  Result Date: 11/10/2022 CLINICAL DATA:  77 year old female status post fall all the way to bathroom. Pain. EXAM: CT LUMBAR SPINE WITHOUT CONTRAST TECHNIQUE: Multidetector CT imaging  of the lumbar spine was performed without intravenous contrast administration. Multiplanar CT image reconstructions were also generated. RADIATION DOSE REDUCTION: This exam was performed according to the departmental dose-optimization program which includes automated exposure control, adjustment of the mA and/or kV according to patient size and/or use of iterative reconstruction technique. COMPARISON:  Thoracic spine CT today.  Lumbar spine CT 11/03/2019. FINDINGS: Segmentation: When numbering from the skull base today, transitional lumbosacral anatomy becomes apparent, S1 is fully lumbarized. Lowest full size ribs are T12, with hypoplastic ribs or unfused transverse process ossification centers at L1. And this is a different numbering system than used on previous lumbar imaging. Correlation with radiographs is recommended prior to any operative intervention. Alignment: Mildly exaggerated lumbar lordosis is stable since 2021, with mild chronic retrolisthesis at multiple levels, underlying mild levoconvex lumbar scoliosis. Vertebrae: Thoracic levels are detailed separately. L1 superior endplate compression is chronic and stable since 2021. Moderate L2 inferior endplate compression is new since 2021. There is mild retropulsion of the posteroinferior L2 endplate. And a right L2 transverse process fracture is new since 2021 although age indeterminate (series 3, image 95). L3 degenerative sclerosis, inferior endplate irregularity, and left body benign bone island are stable since 2021. But minimally displaced right L3 transverse process fracture is new. Mild L4 superior endplate Schmorl's node is chronic and stable. But right L4 transverse process fracture is new since 2021. L5 and S1 appears stable and intact. Visible sacrum and SI joints appear stable and intact. Paraspinal and other soft tissues: Aortoiliac calcified atherosclerosis. Normal caliber abdominal aorta. Surgically absent gallbladder. Chronic large bowel  diverticulosis, especially in the sigmoid colon. Benign left renal upper pole exophytic cyst with simple fluid density (no follow-up imaging recommended). Lumbar paraspinal soft tissues are within normal limits. Disc levels: Widespread chronic lumbar spine degeneration has not significantly changed since 2021. Multilevel mild-to-moderate lumbar spinal stenosis results. No severe lumbar spinal stenosis. Chronic lower lumbar facet arthropathy. And high chronic vacuum facet at the bilateral  S1-S2 levels. IMPRESSION: 1. 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 This is concordant with cervical and thoracic spine numbering today, different from prior lumbar imaging. Correlation with radiographs is recommended prior to any operative intervention. 2. Moderate L2 inferior endplate compression fracture, and right side transverse process fractures of L2 through L4 are new since 2021 and age indeterminate. If specific therapy such as vertebroplasty is desired, Lumbar MRI without contrast or Nuclear Medicine Whole-body Bone Scan would best determine acuity. 3. Chronic L1 compression is stable since 2021. And advanced lumbar spine degeneration has not significantly changed. 4. Thoracic spine CT reported separately. Electronically Signed   By: Genevie Ann M.D.   On: 11/10/2022 06:29   CT Thoracic Spine Wo Contrast  Result Date: 11/10/2022 CLINICAL DATA:  77 year old female status post fall all the way to bathroom. Pain. EXAM: CT THORACIC SPINE WITHOUT CONTRAST TECHNIQUE: Multidetector CT images of the thoracic were obtained using the standard protocol without intravenous contrast. RADIATION DOSE REDUCTION: This exam was performed according to the departmental dose-optimization program which includes automated exposure control, adjustment of the mA and/or kV according to patient size and/or use of iterative reconstruction technique. COMPARISON:  Cervical spine CT today reported separately. Prior cervical spine CT  05/15/2022. CTA chest 07/09/2013. Lumbar spine CT 11/03/2019. FINDINGS: Limited cervical spine imaging:  Reported separately today. Thoracic spine segmentation:  Normal. Alignment: Thoracic kyphosis has not significantly changed since 2014. Vertebrae: T1 vertebral body has become partially fragmented and sclerotic since August, was grossly intact on cervical spine CT at that time. Mild adjacent T2 superior endplate compression appears stable although less sclerotic. Chronic T3 superior endplate compression and irregularity is stable since August. Mild T4 superior endplate compression is new since 2014 and age indeterminate. T5 and T6 appear to remain intact. T7 and T8 both demonstrate partially sclerotic superior endplate compression which is moderate at the former. Both levels were normal in 2014. T9 appears intact. T10 demonstrates mild superior endplate compression, new since 2014. T10, T11, and T12 all demonstrate mild superior endplate compression. T10 was intact in 2014. T11 and T12 levels are stable from the 2021 lumbar CT. There is no associated retropulsion at any thoracic level. Thoracic pedicles and posterior elements appear to remain intact and aligned. Rib motion artifact. There are chronic fractures of the right 12th, left 11th rib costovertebral junctions. No acute posterior rib fracture identified. L1 is reported with the lumbar spine today separately. Paraspinal and other soft tissues: Respiratory motion and atelectasis. Partially visible calcified coronary artery atherosclerosis. Calcified aortic atherosclerosis. No pericardial or pleural effusion. Surgically absent gallbladder. Negative visible noncontrast upper abdominal viscera; simple fluid density exophytic left renal upper pole cyst was present in 2021 (no follow-up imaging recommended). Thoracic paraspinal soft tissues remain within normal limits. Disc levels: Mild for age thoracic spine degeneration. No CT evidence of thoracic spinal  stenosis. IMPRESSION: 1. Widespread thoracic spine compression fractures, many age indeterminate (see #2). But no retropulsion or complicating features associated. If specific therapy is desired, Thoracic MRI without contrast or Nuclear Medicine Whole-body Bone Scan would confirm candidacy for vertebroplasty. 2. T1 compression and vertebral body sclerosis are new since August. And T4, T7, T8, and T10 compression fractures are age indeterminate - but of these T4 might be most acute. 3. T2, T3, T11, and T12 compression fractures are chronic along with posterior 11th and 12th rib fractures. 4.  Aortic Atherosclerosis (ICD10-I70.0). Electronically Signed   By: Genevie Ann M.D.   On: 11/10/2022 06:19  CT Cervical Spine Wo Contrast  Result Date: 11/10/2022 CLINICAL DATA:  77 year old female status post fall all the way to bathroom. Pain. C4 spinous process fracture last year. EXAM: CT CERVICAL SPINE WITHOUT CONTRAST TECHNIQUE: Multidetector CT imaging of the cervical spine was performed without intravenous contrast. Multiplanar CT image reconstructions were also generated. RADIATION DOSE REDUCTION: This exam was performed according to the departmental dose-optimization program which includes automated exposure control, adjustment of the mA and/or kV according to patient size and/or use of iterative reconstruction technique. COMPARISON:  Head CT today.  Cervical spine CT 05/15/2022. FINDINGS: Study is intermittently degraded by motion artifact despite repeated imaging attempts. Sagittal series 6 is most diagnostic. Alignment: Stable straightening of cervical lordosis from last year. Cervicothoracic junction alignment is within normal limits. Bilateral posterior element alignment is within normal limits. Skull base and vertebrae: Visualized skull base is intact. No atlanto-occipital dissociation. C1 and C2 appear stable and intact. Chronic ununited C4 spinous process fracture remains nondisplaced. No new osseous abnormality  identified in the cervical spine. Soft tissues and spinal canal: No prevertebral fluid or swelling. No visible canal hematoma. Partially retropharyngeal carotids, normal variant. Disc levels:  Mild for age cervical spine degeneration. Upper chest: Abnormal T1 vertebral body, new since August. See thoracic spine CT today reported separately. IMPRESSION: 1. Mild motion artifact. Chronic ununited C4 spinous process fracture. No acute traumatic injury identified in the cervical spine. 2. Abnormal T1 vertebral body, new since August. See Thoracic Spine CT today reported separately. Electronically Signed   By: Genevie Ann M.D.   On: 11/10/2022 06:07   CT Head Wo Contrast  Result Date: 11/10/2022 CLINICAL DATA:  77 year old female status post fall all the way to bathroom. Pain. EXAM: CT HEAD WITHOUT CONTRAST TECHNIQUE: Contiguous axial images were obtained from the base of the skull through the vertex without intravenous contrast. RADIATION DOSE REDUCTION: This exam was performed according to the departmental dose-optimization program which includes automated exposure control, adjustment of the mA and/or kV according to patient size and/or use of iterative reconstruction technique. COMPARISON:  Head CT 05/15/2022. FINDINGS: Brain: Mild motion artifact despite repeated imaging attempts. Cerebral volume remains normal for age. No midline shift, ventriculomegaly, mass effect, evidence of mass lesion, intracranial hemorrhage or evidence of cortically based acute infarction. Gray-white differentiation stable and within normal limits. Vascular: Calcified atherosclerosis at the skull base. No suspicious intracranial vascular hyperdensity. Skull: Motion artifact at the skull base. No acute osseous abnormality identified. Sinuses/Orbits: Visualized paranasal sinuses and mastoids are stable and well aerated. Other: Mild motion artifact. Mild right zygoma region soft tissue superficial hematoma or contusion series 3, image 26. No  posttraumatic soft tissue gas identified. No other acute orbit or scalp soft tissue injury identified. IMPRESSION: 1. Right zygoma region superficial soft tissue hematoma or contusion. 2. Mild motion artifact. No other acute traumatic injury or acute intracranial abnormality identified. Electronically Signed   By: Genevie Ann M.D.   On: 11/10/2022 06:04   DG Chest Portable 1 View  Result Date: 11/10/2022 CLINICAL DATA:  77 year old female with history of trauma from a fall. EXAM: PORTABLE CHEST 1 VIEW COMPARISON:  Chest x-ray 06/18/2020. FINDINGS: Lung volumes are low. Coarse interstitial markings are noted throughout the lungs bilaterally, most evident in the mid to lower lungs, where there is also some peribronchial cuffing. Possible small left pleural effusion. No definite right pleural effusion. No pneumothorax. No evidence of pulmonary edema. Heart size is upper limits of normal. The patient is rotated to the left  on today's exam, resulting in distortion of the mediastinal contours and reduced diagnostic sensitivity and specificity for mediastinal pathology. Atherosclerotic calcifications are noted in the thoracic aorta. IMPRESSION: 1. The appearance of the chest may suggest interstitial lung disease. Alternatively, in the appropriate clinical setting, the possibility of acute bronchitis should be considered. If there is clinical concern for interstitial lung disease, follow-up nonemergent outpatient high-resolution chest CT should be obtained. 2. Possible small left pleural effusion. 3. Aortic atherosclerosis. Electronically Signed   By: Vinnie Langton M.D.   On: 11/10/2022 06:01   DG Pelvis Portable  Result Date: 11/10/2022 CLINICAL DATA:  77 year old female with history of trauma from a fall. EXAM: PORTABLE PELVIS 1-2 VIEWS COMPARISON:  06/13/2019. FINDINGS: There is no evidence of pelvic fracture or diastasis. No pelvic bone lesions are seen. Degenerative changes of osteoarthritis are noted in the hip  joints bilaterally. Two safety pins project over the upper pelvis, presumably exterior to the patient. IMPRESSION: 1. No acute radiographic abnormality of the bony pelvis. 2. Mild bilateral hip joint osteoarthritis. Electronically Signed   By: Vinnie Langton M.D.   On: 11/10/2022 05:59   DG Shoulder Left  Result Date: 11/10/2022 CLINICAL DATA:  77 year old female with history of trauma from a fall complaining of left shoulder pain. EXAM: LEFT SHOULDER - 2+ VIEW COMPARISON:  Left shoulder radiograph 08/02/2022. FINDINGS: Anterior subcoracoid dislocation. Glenoid appears impacted upon the superolateral aspect of the humeral head where there is a focal defect, suspicious for a Hill-Sachs fracture. No other definite acute displaced fracture confidently identified. IMPRESSION: 1. Anterior subcoracoid left shoulder dislocation with probable Hill-Sachs type fracture, as above. Electronically Signed   By: Vinnie Langton M.D.   On: 11/10/2022 05:59    Procedures Reduction of dislocation  Date/Time: 11/10/2022 7:19 AM  Performed by: Marcello Fennel, PA-C Authorized by: Marcello Fennel, PA-C  Consent: Verbal consent obtained. Risks and benefits: risks, benefits and alternatives were discussed Consent given by: patient Patient identity confirmed: verbally with patient Time out: Immediately prior to procedure a "time out" was called to verify the correct patient, procedure, equipment, support staff and site/side marked as required. Preparation: Patient was prepped and draped in the usual sterile fashion. Local anesthesia used: no  Anesthesia: Local anesthesia used: no  Sedation: Patient sedated: no  Patient tolerance: patient tolerated the procedure well with no immediate complications       Medications Ordered in ED Medications  albuterol (VENTOLIN HFA) 108 (90 Base) MCG/ACT inhaler 1-2 puff (has no administration in time range)  HYDROmorphone (DILAUDID) injection 0.5 mg (0.5 mg  Intravenous Given 11/10/22 0454)  fentaNYL (SUBLIMAZE) injection 50 mcg (50 mcg Intravenous Given 11/10/22 0626)  fentaNYL (SUBLIMAZE) injection 25 mcg (25 mcg Intravenous Given 11/10/22 0640)    ED Course/ Medical Decision Making/ A&P Clinical Course as of 11/10/22 0738  Wed Nov 10, 2022  0732 Mechanical fall, age indeterminate fracture T1, L2, not tender compared to normal. +Left shoulder dislocation, sling. Needs to walk prior to dispo. Dispo on doxy for acute bronchitis [CP]    Clinical Course User Index [CP] Anselmo Pickler, PA-C                             Medical Decision Making Amount and/or Complexity of Data Reviewed Labs: ordered. Radiology: ordered.  Risk Prescription drug management.   This patient presents to the ED for concern of fall, this involves an extensive number of treatment options,  and is a complaint that carries with it a high risk of complications and morbidity.  The differential diagnosis includes intracranial bleed, thoracic/abdominal trauma, orthopedic injury    Additional history obtained:  Additional history obtained from EMS External records from outside source obtained and reviewed including an ER notes   Co morbidities that complicate the patient evaluation  Hypertension  Social Determinants of Health:  Geriatric    Lab Tests:  I Ordered, and personally interpreted labs.  The pertinent results include: CBC shows slight leukocytosis of 11.4, BMP reveals potassium 3.4, glucose 141, calcium 8.5   Imaging Studies ordered:  I ordered imaging studies including chest x-ray, pelvic x-ray, left shoulder x-ray, CT head and neck thoracic spine lumbar spine I independently visualized and interpreted imaging which showed CT head, C-spine both negative for acute findings, thoracic spine reveals T1 compression fracture new since August, T4 T7-T8 and T10 compression fractures are age-indeterminate, CT lumbar spine, L2 endplate compression fracture  new since 2021, chest x-ray reveals interstitial lung disease, versus possible acute bronchitis, x-ray of the left shoulder reveals anterior dislocation I agree with the radiologist interpretation, pelvis x-ray unremarkable   Cardiac Monitoring:  The patient was maintained on a cardiac monitor.  I personally viewed and interpreted the cardiac monitored which showed an underlying rhythm of: n/a   Medicines ordered and prescription drug management:  I ordered medication including Dilaudid for pain I have reviewed the patients home medicines and have made adjustments as needed  Critical Interventions:  Left-sided shoulder dislocation recommend reduction patient agreed this plan tolerated well repeat films showed reduction of shoulder   Reevaluation:  Presents with a fall, will obtain lab work imaging and reassess  Imaging reveals left-sided shoulder dislocation  Patient is reassessed resting comfortably will continue to monitor.   Consultations Obtained:  N/a    Test Considered:  CT AP-deferred my suspicion for an abdominal trauma is low at this time abdomen soft nontender no evident trauma present my exam.    Rule out low suspicion for intracranial head bleed as patient denies loss of conscious, is not on anticoagulant, she does not endorse headaches, paresthesia/weakness in the upper and lower extremities, no focal deficits present on my exam ct head negative.  Low suspicion for spinal cord abnormality or spinal fracture spine was palpated was nontender to palpation, patient has full range of motion in the upper and lower extremities. CT imaging reveals age-indeterminate fractures most concerning at T1 and L2, on reassessment of her back she does has not significantly tenderness, states she has chronic pain and states this feels like her normal, I doubt these are new at this time. Suspicion for thoracic/abdominal trauma is low at this time as no evident trauma present my exam  both which are nontender to palpation.    Dispostion and problem list  Due shift change patient will be handed off to [facility]  Ensure that patient can ambulate and tolerate p.o., if patient is unable to do so likely will need ED hold need PT OT eval and social work consult as patient lives home alone.   Left shoulder dislocation-patient is placed in a sling, will have her follow-up with orthopedics for further evaluation. Acute bronchitis-patient's slight expiratory wheezing present my exam, will provide with a short course of steroids, and start on short course of antibiotics follow-up with PCP for reassessment.             Final Clinical Impression(s) / ED Diagnoses Final diagnoses:  Dislocation of  left shoulder joint, initial encounter  Acute bronchitis, unspecified organism    Rx / DC Orders ED Discharge Orders          Ordered    doxycycline (VIBRAMYCIN) 100 MG capsule  2 times daily        11/10/22 0709    predniSONE (DELTASONE) 10 MG tablet  Daily        11/10/22 0709              Marcello Fennel, PA-C 11/10/22 8250    Quintella Reichert, MD 11/10/22 [phone]

## 2022-11-10 NOTE — ED Triage Notes (Signed)
Pt arrives [facility] from home after falling trying to get to the bathroom. Pt c/o left shoulder/arm pain. No LOC. Pt does not take blood thinners.

## 2022-11-10 NOTE — ED Notes (Signed)
Shoulder sling applied by Horris Latino, Medic

## 2022-11-10 NOTE — Discharge Instructions (Signed)
Left shoulder dislocation-able to reduce the shoulder, placed in a sling please wear at all times.  Like to follow-up with orthopedics for further evaluation Bronchitis-started on steroids and antibiotics please take as prescribed, given inhaler please use 1 to 2 puffs as needed for wheezing.  Please follow your primary care doctor for reassessment

## 2022-11-10 NOTE — ED Notes (Signed)
Pt fell leaning over to pick up her phone. NT assisted pt to the ground

## 2022-11-10 NOTE — Telephone Encounter (Signed)
Left message for patient to call back to schedule Medicare Annual Wellness Visit   Last AWV  10/11/18  Please schedule at anytime with [facility] if patient calls the office back.    30 Minutes appointment   Any questions, please call me at [phone]

## 2022-11-12 ENCOUNTER — Ambulatory Visit: Payer: Medicare Other | Admitting: Internal Medicine

## 2022-11-18 ENCOUNTER — Encounter: Payer: Self-pay | Admitting: Internal Medicine

## 2022-11-18 NOTE — Progress Notes (Signed)
Subjective:    Patient ID: Dawn Kim, female    DOB: 06-Mar-1946, 77 y.o.   MRN: [id number]     HPI Harmanie is here for follow up of her chronic medical problems, including htn, hld, hypothyroidism, gerd, insomnia, anxiety, depression, CKD  ED 2/7 - fall at home -> left shoulder dislocation.  No other injuries.  ? Able to care for self   She fell again when she was at home just after the hospital     Falls -   Sleep -   Medications and allergies reviewed with patient and updated if appropriate.  Current Outpatient Medications on File Prior to Visit  Medication Sig Dispense Refill   acetaminophen (TYLENOL) 325 MG tablet Take 650 mg by mouth every 6 (six) hours as needed for mild pain or moderate pain.      ALPRAZolam (XANAX) 0.5 MG tablet TAKE 1 TABLET BY MOUTH THREE TIMES DAILY AS NEEDED FOR ANXIETY . DO NOT EXCEED 3 PER 24 HOURS ** DO NOT FILL EARLY** 90 tablet 0   amLODipine (NORVASC) 5 MG tablet Take 1 tablet by mouth once daily 90 tablet 0   calcium carbonate (OS-CAL - DOSED IN MG OF ELEMENTAL CALCIUM) 1250 (500 Ca) MG tablet Take 1 tablet (500 mg of elemental calcium total) by mouth daily.     cholecalciferol (VITAMIN D) 1000 UNITS tablet Take 1,000 Units by mouth daily.     cilostazol (PLETAL) 50 MG tablet Take 0.5 tablets (25 mg total) by mouth 2 (two) times daily. Annual appt due in May must see provider for future refills 60 tablet 5   famotidine (PEPCID) 40 MG tablet TAKE 1 TABLET BY MOUTH AT BEDTIME 90 tablet 0   ferrous sulfate 325 (65 FE) MG EC tablet Take 325 mg by mouth daily with breakfast.     fish oil-omega-3 fatty acids 1000 MG capsule Take 1 g by mouth 2 (two) times daily.      furosemide (LASIX) 20 MG tablet Take 1 tablet by mouth once daily 30 tablet 0   levocetirizine (XYZAL) 5 MG tablet TAKE 1 TABLET BY MOUTH ONCE DAILY IN THE EVENING 90 tablet 0   levothyroxine (SYNTHROID) 112 MCG tablet Take 1 tablet by mouth once daily 90 tablet 0   Multiple  Vitamin (MULTIVITAMIN) tablet Take 1 tablet by mouth daily.     omeprazole (PRILOSEC) 40 MG capsule TAKE 1 CAPSULE BY MOUTH TWICE DAILY BEFORE A MEAL 180 capsule 0   potassium chloride (KLOR-CON M) 10 MEQ tablet Take 2 tablets by mouth once daily 60 tablet 0   QUEtiapine (SEROQUEL) 100 MG tablet TAKE 1 TABLET BY MOUTH AT BEDTIME 90 tablet 0   simvastatin (ZOCOR) 40 MG tablet Take 1 tablet by mouth in the evening 90 tablet 0   venlafaxine XR (EFFEXOR-XR) 150 MG 24 hr capsule TAKE 1 CAPSULE BY MOUTH ONCE DAILY WITH BREAKFAST 90 capsule 0   venlafaxine XR (EFFEXOR-XR) 75 MG 24 hr capsule TAKE 1 CAPSULE BY MOUTH ONCE DAILY WITH BREAKFAST OF 225 MG DAILY 90 capsule 0   No current facility-administered medications on file prior to visit.     Review of Systems  Constitutional:  Negative for fever.  Respiratory:  Positive for shortness of breath. Negative for cough and wheezing.   Cardiovascular:  Positive for leg swelling (controlled). Negative for chest pain and palpitations.  Musculoskeletal:  Positive for arthralgias (knees).  Neurological:  Positive for dizziness (occ when she gets up),  weakness (left arm (dislocation), leg weakness) and headaches. Negative for light-headedness and numbness.  Psychiatric/Behavioral:  The patient is nervous/anxious.        Objective:   Vitals:   11/19/22 1547  BP: 138/80  Pulse: 70  Temp: 98.4 F (36.9 C)  SpO2: 98%   BP Readings from Last 3 Encounters:  11/19/22 138/80  11/10/22 (!) 142/71  08/02/22 125/78   Wt Readings from Last 3 Encounters:  03/02/22 179 lb (81.2 kg)  09/01/21 178 lb (80.7 kg)  03/30/21 186 lb 6.4 oz (84.6 kg)   Body mass index is 31.71 kg/m.    Physical Exam     Lab Results  Component Value Date   WBC 11.4 (H) 11/10/2022   HGB 12.3 11/10/2022   HCT 39.7 11/10/2022   PLT 211 11/10/2022   GLUCOSE 141 (H) 11/10/2022   CHOL 150 03/02/2022   TRIG 133.0 03/02/2022   HDL 52.30 03/02/2022   LDLDIRECT 86.0  09/01/2021   LDLCALC 71 03/02/2022   ALT 17 05/15/2022   AST 25 05/15/2022   NA 139 11/10/2022   K 3.4 (L) 11/10/2022   CL 102 11/10/2022   CREATININE 0.91 11/10/2022   BUN 13 11/10/2022   CO2 27 11/10/2022   TSH 13.511 (H) 05/15/2022   INR 2.33 (H) 12/02/2009   HGBA1C 5.5 09/01/2021     Assessment & Plan:    See Problem List for Assessment and Plan of chronic medical problems.

## 2022-11-19 ENCOUNTER — Encounter: Payer: Self-pay | Admitting: Internal Medicine

## 2022-11-19 ENCOUNTER — Other Ambulatory Visit: Payer: Self-pay

## 2022-11-19 ENCOUNTER — Ambulatory Visit (INDEPENDENT_AMBULATORY_CARE_PROVIDER_SITE_OTHER): Payer: Medicare HMO | Admitting: Internal Medicine

## 2022-11-19 VITALS — BP 138/80 | HR 70 | Temp 98.4°F | Ht 63.0 in

## 2022-11-19 DIAGNOSIS — F419 Anxiety disorder, unspecified: Secondary | ICD-10-CM

## 2022-11-19 DIAGNOSIS — R296 Repeated falls: Secondary | ICD-10-CM

## 2022-11-19 DIAGNOSIS — R600 Localized edema: Secondary | ICD-10-CM

## 2022-11-19 DIAGNOSIS — E7800 Pure hypercholesterolemia, unspecified: Secondary | ICD-10-CM

## 2022-11-19 DIAGNOSIS — I10 Essential (primary) hypertension: Secondary | ICD-10-CM | POA: Diagnosis not present

## 2022-11-19 DIAGNOSIS — E039 Hypothyroidism, unspecified: Secondary | ICD-10-CM

## 2022-11-19 DIAGNOSIS — N1831 Chronic kidney disease, stage 3a: Secondary | ICD-10-CM

## 2022-11-19 DIAGNOSIS — G4709 Other insomnia: Secondary | ICD-10-CM

## 2022-11-19 DIAGNOSIS — F32A Depression, unspecified: Secondary | ICD-10-CM

## 2022-11-19 NOTE — Patient Instructions (Addendum)
      Blood work was ordered.   The lab is on the first floor.    Medications changes include :   decrease seroquel to 50 mg daily for two weeks and then stop.      Think about seeing neurology for further evaluation of the falls.    Think about seeing psychiatry for evaluation and help with your sleep.    Return in about 3 months (around 02/17/2023) for follow up.

## 2022-11-20 LAB — TSH: TSH: 17.87 mIU/L — ABNORMAL HIGH (ref 0.40–4.50)

## 2022-11-20 MED ORDER — OMEPRAZOLE 40 MG PO CPDR: DELAYED_RELEASE_CAPSULE | ORAL | 0 refills | Status: DC

## 2022-11-20 MED ORDER — AMLODIPINE BESYLATE 5 MG PO TABS: 5.0000 mg | ORAL_TABLET | Freq: Every day | ORAL | 0 refills | Status: DC

## 2022-11-20 MED ORDER — SIMVASTATIN 40 MG PO TABS: 40.0000 mg | ORAL_TABLET | Freq: Every evening | ORAL | 0 refills | Status: DC

## 2022-11-20 MED ORDER — LEVOTHYROXINE SODIUM 112 MCG PO TABS: 112.0000 ug | ORAL_TABLET | Freq: Every day | ORAL | 0 refills | Status: DC

## 2022-11-20 MED ORDER — LEVOTHYROXINE SODIUM 125 MCG PO TABS: 125.0000 ug | ORAL_TABLET | Freq: Every day | ORAL | 3 refills | Status: DC

## 2022-11-20 MED ORDER — CILOSTAZOL 50 MG PO TABS: 25.0000 mg | ORAL_TABLET | Freq: Two times a day (BID) | ORAL | 5 refills | Status: DC

## 2022-11-20 NOTE — Assessment & Plan Note (Addendum)
Chronic BP well controlled Continue amlodipine 5 mg daily

## 2022-11-20 NOTE — Assessment & Plan Note (Signed)
Chronic-stable.

## 2022-11-20 NOTE — Assessment & Plan Note (Signed)
Chronic  Clinically euthyroid Check tsh and will titrate med dose if needed Currently taking levothyroxine 112 mcg daily

## 2022-11-20 NOTE — Assessment & Plan Note (Signed)
Chronic Controlled, stable Continue lasix 20 mg daily  

## 2022-11-20 NOTE — Addendum Note (Signed)
Addended by: Binnie Rail on: 11/20/2022 05:26 PM   Modules accepted: Orders

## 2022-11-20 NOTE — Assessment & Plan Note (Signed)
Chronic Feels anxious - anxiety has never been controlled On effexor 225 mg daily, xanax 0.5 mg tid Not sleeping - taper off seroquel Discussed tapering off effexor and starting something new Would benefit from seeing psychiatry but she does not think it is covered by her insurance - encouraged daughter to call insurance to see coverage Continue current medications

## 2022-11-20 NOTE — Assessment & Plan Note (Signed)
Chronic Likely multifactorial Has chronic severe b/l Knee OA, physical deconditioning both likely contributing Discussed neurology eval to see if anything else is contributing - she will consider it Deferred PT  Using walker - stressed using that consistently

## 2022-11-20 NOTE — Assessment & Plan Note (Signed)
Chronic °Check lipid panel  °Continue simvastatin 40 mg daily °

## 2022-11-20 NOTE — Assessment & Plan Note (Signed)
Chronic Not sleeping despite seroquel 100 mg nightly - will taper off Has tried several medication in the past - they were not effective or caused side effects Will re-evaluate at next visit Advised same sleep / wake times, no napping, stop all caffeine except one cup of coffee in am

## 2022-11-22 ENCOUNTER — Telehealth: Payer: Self-pay | Admitting: Internal Medicine

## 2022-11-22 NOTE — Telephone Encounter (Signed)
Called number back and wrong number given in message.  If they call back okay to give verbals per Dr. Quay Burow.

## 2022-11-22 NOTE — Telephone Encounter (Signed)
Okay to give verbal.  Not sure if the patient really wants to do PT or not, but it would be good for her to do it.

## 2022-11-22 NOTE — Telephone Encounter (Signed)
Message left for return call for verbals.  Did not want to leave info because regarding patient's name on voicemail.  If they call back okay to give verbals.

## 2022-11-22 NOTE — Telephone Encounter (Signed)
Amy Hyatt from [facility] called requesting whether or not this patient needs Physical Therapy. A referral was put in when she was in the hospital and they just want to confirm if this patient needs PT or not and if they could get a verbal order. Best callback number for Amy is [phone].

## 2022-11-23 ENCOUNTER — Telehealth: Payer: Self-pay | Admitting: Internal Medicine

## 2022-11-23 NOTE — Telephone Encounter (Signed)
Pt called trying to see if she need to make another appt for to have her labs done? Pt stated if Dr. Quay Burow could prescribe her some sleeping medicine that she mention to her before.   Pt would like a call back with update.

## 2022-11-24 ENCOUNTER — Other Ambulatory Visit: Payer: Self-pay | Admitting: Internal Medicine

## 2022-11-24 DIAGNOSIS — F419 Anxiety disorder, unspecified: Secondary | ICD-10-CM

## 2022-11-24 MED ORDER — BELSOMRA 15 MG PO TABS: 15.0000 mg | ORAL_TABLET | Freq: Every evening | ORAL | 0 refills | Status: DC | PRN

## 2022-11-24 NOTE — Telephone Encounter (Signed)
Patient's daughter called and would like to discuss her mother's sleep medication - she states it needs to be changed.  Please call daughter Luetta Nutting - [phone]

## 2022-11-24 NOTE — Telephone Encounter (Signed)
I still think she would benefit from seeing psychiatry to help with sleep medication.  She needs to come off the Seroquel before I can start a different medication.  Like we discussed at her visit she is tried 5 different sleep medications and either had side effects or the stop working so I do not think it would be easy to find a medication that will work well for her.  Medication also will likely cause more memory issues or increase her risk of falling.  When she is off the Seroquel if you want to try something we can, but we have to see what is approved by her insurance.

## 2022-11-24 NOTE — Telephone Encounter (Signed)
b

## 2022-11-24 NOTE — Telephone Encounter (Signed)
Belsomra sent to pharmacy - will likely need PA

## 2022-11-25 ENCOUNTER — Other Ambulatory Visit: Payer: Self-pay | Admitting: Internal Medicine

## 2022-11-26 ENCOUNTER — Other Ambulatory Visit: Payer: Self-pay | Admitting: Internal Medicine

## 2022-11-26 ENCOUNTER — Other Ambulatory Visit: Payer: Self-pay

## 2022-12-02 ENCOUNTER — Telehealth (INDEPENDENT_AMBULATORY_CARE_PROVIDER_SITE_OTHER): Payer: Medicare HMO | Admitting: Family Medicine

## 2022-12-02 ENCOUNTER — Telehealth: Payer: Self-pay | Admitting: Internal Medicine

## 2022-12-02 ENCOUNTER — Other Ambulatory Visit: Payer: Self-pay | Admitting: Internal Medicine

## 2022-12-02 DIAGNOSIS — R051 Acute cough: Secondary | ICD-10-CM | POA: Diagnosis not present

## 2022-12-02 DIAGNOSIS — R0981 Nasal congestion: Secondary | ICD-10-CM | POA: Diagnosis not present

## 2022-12-02 MED ORDER — BENZONATATE 100 MG PO CAPS: ORAL_CAPSULE | ORAL | 0 refills | Status: DC

## 2022-12-02 MED ORDER — DOXYCYCLINE HYCLATE 100 MG PO TABS: 100.0000 mg | ORAL_TABLET | Freq: Two times a day (BID) | ORAL | 0 refills | Status: DC

## 2022-12-02 NOTE — Telephone Encounter (Signed)
Message left for patient to contact me and let me know if she was able to pick up script or not.

## 2022-12-02 NOTE — Progress Notes (Signed)
Virtual Visit via Telephone Note  I connected with Dawn Kim on 12/02/22 at  5:00 PM EST by telephone and verified that I am speaking with the correct person using two identifiers.   I discussed the limitations of performing an evaluation and management service by telephone and requested permission for a phone visit. The patient expressed understanding and agreed to proceed.  Location patient:  [place] Location provider: work or home office Participants present for the call: patient, provider, daughter Patient did not have a visit with me in the prior 7 days to address this/these issue(s).   History of Present Illness:  Acute telemedicine visit for a cough and congestion: -Onset: 3 days ago -Symptoms include: laryngitis, cough, she had a little wheezing a few times - no better, nasal congestion -no wheezing now -Denies: fever, SOB, CP, NVD, known sick contacts -Has tried: nothing -Pertinent past medical history: see below -Pertinent medication allergies:  Allergies  Allergen Reactions   Penicillins Hives and Anaphylaxis    Has patient had a PCN reaction causing immediate rash, facial/tongue/throat swelling, SOB or lightheadedness with hypotension: No Has patient had a PCN reaction causing severe rash involving mucus membranes or skin necrosis: Yes  Has patient had a PCN reaction that required hospitalization: No Has patient had a PCN reaction occurring within the last 10 years: No  If all of the above answers are "NO", then may proceed with Cephalosporin use.    Eszopiclone Other (See Comments)    Ataxia, confusion   Gabapentin Other (See Comments)    Caused parkinsonism   Sonata [Zaleplon]     Confusion, memory difficulties, ataxia   Ambien [Zolpidem]     Confusion, hung over feeling   Amoxicillin Rash    Has patient had a PCN reaction causing immediate rash, facial/tongue/throat swelling, SOB or lightheadedness with hypotension: No  Has patient had a PCN reaction causing  severe rash involving mucus membranes or skin necrosis: Yes Has patient had a PCN reaction that required hospitalization No Has patient had a PCN reaction occurring within the last 10 years: No If all of the above answers are "NO", then may proceed with Cephalosporin use.    -COVID-19 vaccine status: Immunization History  Administered Date(s) Administered   COVID-19, mRNA, vaccine(Comirnaty)12 years and older 07/13/2022   Fluad Quad(high Dose 65+) 06/05/2019, 07/11/2020, 07/08/2021   Influenza Split 07/04/2012   Influenza, High Dose Seasonal PF 06/21/2016, 06/12/2018   Influenza,inj,Quad PF,6+ Mos 06/25/2014   Influenza-Unspecified 06/05/2015, 07/05/2017   PFIZER(Purple Top)SARS-COV-2 Vaccination 10/15/2019, 11/04/2019, 07/18/2020   PNEUMOCOCCAL CONJUGATE-20 04/25/2022   Pneumococcal Conjugate-13 09/01/2016   Pneumococcal Polysaccharide-23 10/06/2017   Respiratory Syncytial Virus Vaccine,Recomb Aduvanted(Arexvy) 07/13/2022   Tdap 07/31/2018   Zoster Recombinat (Shingrix) 03/04/2017, 05/25/2017      Past Medical History:  Diagnosis Date   Anxiety    Cataract    Bil   Depression    Hypercholesteremia    Hypertension    Thyroid disease     Current Outpatient Medications on File Prior to Visit  Medication Sig Dispense Refill   acetaminophen (TYLENOL) 325 MG tablet Take 650 mg by mouth every 6 (six) hours as needed for mild pain or moderate pain.      ALPRAZolam (XANAX) 0.5 MG tablet TAKE 1 TABLET BY MOUTH THREE TIMES DAILY AS NEEDED FOR ANXIETY. DO NOT EXCEED 3 PER 24 HOURS **DO NOT FILL EARLY** 90 tablet 0   amLODipine (NORVASC) 5 MG tablet Take 1 tablet (5 mg total) by mouth daily. For  high blood pressure 90 tablet 0   calcium carbonate (OS-CAL - DOSED IN MG OF ELEMENTAL CALCIUM) 1250 (500 Ca) MG tablet Take 1 tablet (500 mg of elemental calcium total) by mouth daily.     cholecalciferol (VITAMIN D) 1000 UNITS tablet Take 1,000 Units by mouth daily.     cilostazol (PLETAL) 50  MG tablet Take 0.5 tablets (25 mg total) by mouth 2 (two) times daily. For circulation 60 tablet 5   famotidine (PEPCID) 40 MG tablet TAKE 1 TABLET BY MOUTH AT BEDTIME 90 tablet 0   ferrous sulfate 325 (65 FE) MG EC tablet Take 325 mg by mouth daily with breakfast.     fish oil-omega-3 fatty acids 1000 MG capsule Take 1 g by mouth 2 (two) times daily.      furosemide (LASIX) 20 MG tablet Take 1 tablet by mouth once daily 30 tablet 0   levocetirizine (XYZAL) 5 MG tablet TAKE 1 TABLET BY MOUTH ONCE DAILY IN THE EVENING 90 tablet 0   levothyroxine (SYNTHROID) 125 MCG tablet Take 1 tablet (125 mcg total) by mouth daily. For thyroid 90 tablet 3   Multiple Vitamin (MULTIVITAMIN) tablet Take 1 tablet by mouth daily.     omeprazole (PRILOSEC) 40 MG capsule TAKE 1 CAPSULE BY MOUTH TWICE DAILY BEFORE A MEAL.  FOR HEARTBURN 180 capsule 0   potassium chloride (KLOR-CON M) 10 MEQ tablet Take 2 tablets by mouth once daily 60 tablet 0   simvastatin (ZOCOR) 40 MG tablet Take 1 tablet (40 mg total) by mouth every evening. For cholesterol 90 tablet 0   Suvorexant (BELSOMRA) 15 MG TABS Take 1 tablet (15 mg total) by mouth at bedtime as needed. 30 tablet 0   venlafaxine XR (EFFEXOR-XR) 150 MG 24 hr capsule TAKE 1 CAPSULE BY MOUTH ONCE DAILY WITH BREAKFAST 90 capsule 0   venlafaxine XR (EFFEXOR-XR) 75 MG 24 hr capsule TAKE 1 CAPSULE BY MOUTH ONCE DAILY WITH BREAKFAST OF 225 MG DAILY 90 capsule 0   No current facility-administered medications on file prior to visit.    Observations/Objective: Patient sounds cheerful and well on the phone. I do not appreciate any SOB. Speech and thought processing are grossly intact. Patient reported vitals:  Assessment and Plan:  Nasal congestion  Acute cough  -we discussed possible serious and likely etiologies, options for evaluation and workup, limitations of telemedicine visit vs in person visit, treatment, treatment risks and precautions. Pt prefers to treat via  telemedicine empirically rather than in person at this moment. Query covid19, VURI vs other. She agrees to do covid test at home. Advised if positive and desires antiviral to contact PCP - did let her know I will be out of the office tomorrow. Sent cough rx and discussed symptomatic care and precuations in terms of seeking care. She requests and abx. Discussed risks, indications. Opted to send doxy for in case worsening or not improving as expected per her preference. Discussed inhaler, but she does not feel sneeds as reports the wheezing was very mild and transient.  Advised to seek prompt in person care if worsening, new symptoms arise, or if is not improving with treatment as expected per our conversation of expected course. Discussed options for follow up care. Did let this patient know that I do telemedicine on Tuesdays and Thursdays for [facility] and those are the days I am logged into the system. Advised to schedule follow up visit with PCP, [facility] virtual visits or [facility] if any further questions or concerns  to avoid delays in care.   I discussed the assessment and treatment plan with the patient. The patient was provided an opportunity to ask questions and all were answered. The patient agreed with the plan and demonstrated an understanding of the instructions.    Follow Up Instructions:  I did not refer this patient for an OV with me in the next 24 hours for this/these issue(s).  I discussed the assessment and treatment plan with the patient. The patient was provided an opportunity to ask questions and all were answered. The patient agreed with the plan and demonstrated an understanding of the instructions.   I spent 15 minutes on the date of this visit in the care of this patient. See summary of tasks completed to properly care for this patient in the detailed notes above which also included counseling of above, review of PMH, medications, allergies, evaluation of the patient and ordering  and/or  instructing patient on testing and care options.     Lucretia Kern, DO

## 2022-12-02 NOTE — Patient Instructions (Signed)
  HOME CARE TIPS:  -COVID19 testing information: [email or web address]  Most pharmacies also offer testing and home test kits. If the Covid19 test is positive and you desire antiviral treatment, please contact a [facility] or schedule a follow up virtual visit through your primary care office or through the [facility].  Other test to treat options: ConnectRV.is?[email or web address]  -I sent the medication(s) we discussed to your pharmacy: Meds ordered this encounter  Medications   benzonatate (TESSALON PERLES) 100 MG capsule    Sig: 1-2 capsules up to twice daily as needed for cough.    Dispense:  30 capsule    Refill:  0   doxycycline (VIBRA-TABS) 100 MG tablet    Sig: Take 1 tablet (100 mg total) by mouth 2 (two) times daily.    Dispense:  14 tablet    Refill:  0     -can use tylenol or aleve if needed for fevers, aches and pains per instructions  -nasal saline sinus rinses twice daily  -stay hydrated, drink plenty of fluids and eat small healthy meals - avoid dairy -follow up with your doctor in 2-3 days unless improving and feeling better  -stay home while sick, except to seek medical care. If you have COVID19, you will likely be contagious for 7-10 days. Flu or Influenza is likely contagious for about 7 days. Other respiratory viral infections remain contagious for 5-10+ days depending on the virus and many other factors. Wear a good mask that fits snugly (such as N95 or KN95) if around others to reduce the risk of transmission.  It was nice to meet you today, and I really hope you are feeling better soon. I help [facility] out with telemedicine visits on Tuesdays and Thursdays and am happy to help if you need a follow up virtual visit on those days. Otherwise, if you have any concerns or questions following this visit please schedule a follow up visit with your Primary Care doctor or seek care at a local urgent care clinic  to avoid delays in care.    Seek in person care or schedule a follow up video visit promptly if your symptoms worsen, new concerns arise or you are not improving with treatment. Call 911 and/or seek emergency care if your symptoms are severe or life threatening.

## 2022-12-03 ENCOUNTER — Other Ambulatory Visit: Payer: Self-pay | Admitting: Internal Medicine

## 2022-12-03 ENCOUNTER — Other Ambulatory Visit: Payer: Self-pay

## 2022-12-06 ENCOUNTER — Telehealth: Payer: Self-pay | Admitting: Internal Medicine

## 2022-12-06 NOTE — Telephone Encounter (Signed)
I know she was here just a little while ago, but if this is a brand-new symptom it should be evaluated further.  There are some medications that we could consider, but there are side effects.

## 2022-12-06 NOTE — Telephone Encounter (Signed)
Pt states she is losing control of her bladder, she can barley make it to the bathroom before she urinates on herself.   Pt wants to know if there's any medication she can take to help control this issue

## 2022-12-07 ENCOUNTER — Other Ambulatory Visit: Payer: Self-pay | Admitting: Internal Medicine

## 2022-12-07 NOTE — Telephone Encounter (Signed)
Called Pt and spoke with her daughter , states if she continues to have this problem they will call the office to make an appt.

## 2022-12-10 ENCOUNTER — Telehealth: Payer: Self-pay | Admitting: Internal Medicine

## 2022-12-10 NOTE — Telephone Encounter (Signed)
Called Pt and left voicemail

## 2022-12-10 NOTE — Telephone Encounter (Signed)
Pt is wanting to speak with Dr burns about her thyroids tried scheduling appt she really wanted to speak with her Doctor can someone please call pt back.  Best call back [phone]

## 2022-12-13 ENCOUNTER — Telehealth: Payer: Self-pay | Admitting: Internal Medicine

## 2022-12-13 ENCOUNTER — Other Ambulatory Visit: Payer: Self-pay | Admitting: Internal Medicine

## 2022-12-13 DIAGNOSIS — G4709 Other insomnia: Secondary | ICD-10-CM

## 2022-12-13 MED ORDER — QUETIAPINE FUMARATE 50 MG PO TABS: 50.0000 mg | ORAL_TABLET | Freq: Every day | ORAL | 5 refills | Status: DC

## 2022-12-13 NOTE — Telephone Encounter (Signed)
Seroquel sent to pharmacy

## 2022-12-13 NOTE — Telephone Encounter (Signed)
Patient called and stated that the medication Suvorexant ([place]) 15 MG TABS  has not been working for her. Patient is requesting to be put back on the previous medication she was on Seroquel. She stated that the prev. Medication worked better for her and would like that medication to be called into her pharmacy on file [address] .  Best callback number is [phone].

## 2022-12-20 ENCOUNTER — Other Ambulatory Visit: Payer: Self-pay | Admitting: Internal Medicine

## 2022-12-20 NOTE — Telephone Encounter (Signed)
Patient is no longer taking the Effexor XR - if you would like to put her on something else that would be fine - - please send it to [facility] on [address].

## 2022-12-23 ENCOUNTER — Other Ambulatory Visit: Payer: Self-pay | Admitting: Internal Medicine

## 2022-12-23 DIAGNOSIS — F419 Anxiety disorder, unspecified: Secondary | ICD-10-CM

## 2022-12-31 ENCOUNTER — Other Ambulatory Visit: Payer: Self-pay | Admitting: Internal Medicine

## 2023-01-09 ENCOUNTER — Encounter: Payer: Self-pay | Admitting: Internal Medicine

## 2023-01-09 DIAGNOSIS — R739 Hyperglycemia, unspecified: Secondary | ICD-10-CM | POA: Insufficient documentation

## 2023-01-09 NOTE — Progress Notes (Signed)
Subjective:    Patient ID: Dawn Kim, female    DOB: 1946-03-29, 77 y.o.   MRN: [id number]      HPI Dawn Kim is here for a Physical exam and her chronic medical problems.      Medications and allergies reviewed with patient and updated if appropriate.  Current Outpatient Medications on File Prior to Visit  Medication Sig Dispense Refill  . acetaminophen (TYLENOL) 325 MG tablet Take 650 mg by mouth every 6 (six) hours as needed for mild pain or moderate pain.     . calcium carbonate (OS-CAL - DOSED IN MG OF ELEMENTAL CALCIUM) 1250 (500 Ca) MG tablet Take 1 tablet (500 mg of elemental calcium total) by mouth daily.    . cholecalciferol (VITAMIN D) 1000 UNITS tablet Take 5,000 Units by mouth daily.    . ferrous sulfate 325 (65 FE) MG EC tablet Take 325 mg by mouth daily with breakfast.    . fish oil-omega-3 fatty acids 1000 MG capsule Take 1 g by mouth 2 (two) times daily.     [name] levothyroxine (SYNTHROID) 125 MCG tablet Take 1 tablet (125 mcg total) by mouth daily. For thyroid 90 tablet 3  . Multiple Vitamin (MULTIVITAMIN) tablet Take 1 tablet by mouth daily.    . QUEtiapine (SEROQUEL) 50 MG tablet Take 1 tablet (50 mg total) by mouth at bedtime. 30 tablet 5  . venlafaxine XR (EFFEXOR-XR) 150 MG 24 hr capsule TAKE 1 CAPSULE BY MOUTH ONCE DAILY WITH BREAKFAST 90 capsule 0   No current facility-administered medications on file prior to visit.    Review of Systems     Objective:  There were no vitals filed for this visit. There were no vitals filed for this visit. There is no height or weight on file to calculate BMI.  BP Readings from Last 3 Encounters:  01/19/23 (!) 144/76  11/19/22 138/80  11/10/22 (!) 142/71    Wt Readings from Last 3 Encounters:  03/02/22 179 lb (81.2 kg)  09/01/21 178 lb (80.7 kg)  03/30/21 186 lb 6.4 oz (84.6 kg)       Physical Exam Constitutional: She appears well-developed and well-nourished. No distress.  HENT:  Head: Normocephalic  and atraumatic.  Right Ear: External ear normal. Normal ear canal and TM Left Ear: External ear normal.  Normal ear canal and TM Mouth/Throat: Oropharynx is clear and moist.  Eyes: Conjunctivae normal.  Neck: Neck supple. No tracheal deviation present. No thyromegaly present.  No carotid bruit  Cardiovascular: Normal rate, regular rhythm and normal heart sounds.   No murmur heard.  No edema. Pulmonary/Chest: Effort normal and breath sounds normal. No respiratory distress. She has no wheezes. She has no rales.  Breast: deferred   Abdominal: Soft. She exhibits no distension. There is no tenderness.  Lymphadenopathy: She has no cervical adenopathy.  Skin: Skin is warm and dry. She is not diaphoretic.  Psychiatric: She has a normal mood and affect. Her behavior is normal.     Lab Results  Component Value Date   WBC 5.9 01/17/2023   HGB 13.3 01/17/2023   HCT 39.8 01/17/2023   PLT 288.0 01/17/2023   GLUCOSE 98 01/17/2023   CHOL 141 01/17/2023   TRIG 130.0 01/17/2023   HDL 51.10 01/17/2023   LDLDIRECT 86.0 09/01/2021   LDLCALC 64 01/17/2023   ALT 17 01/17/2023   AST 22 01/17/2023   NA 142 01/17/2023   K 3.5 01/17/2023   CL 104 01/17/2023   CREATININE  1.06 01/17/2023   BUN 10 01/17/2023   CO2 27 01/17/2023   TSH 0.66 01/17/2023   INR 2.33 (H) 12/02/2009   HGBA1C 5.2 01/17/2023         Assessment & Plan:   Physical exam: Screening blood work  ordered Exercise   Weight   Substance abuse  none   Reviewed recommended immunizations.   Health Maintenance  Topic Date Due  . Medicare Annual Wellness (AWV)  10/12/2019  . DEXA SCAN  08/09/2020  . COVID-19 Vaccine (5 - 2023-24 season) 11/13/2022  . INFLUENZA VACCINE  05/05/2023  . DTaP/Tdap/Td (2 - Td or Tdap) 07/31/2028  . Pneumonia Vaccine 36+ Years old  Completed  . Hepatitis C Screening  Completed  . Zoster Vaccines- Shingrix  Completed  . HPV VACCINES  Aged Out  . Fecal DNA (Cologuard)  Discontinued           See Problem List for Assessment and Plan of chronic medical problems.     This encounter was created in error - please disregard.

## 2023-01-09 NOTE — Patient Instructions (Signed)
      Blood work was ordered.   The lab is on the first floor.    Medications changes include :       A referral was ordered for XXX.     Someone will call you to schedule an appointment.    No follow-ups on file.  

## 2023-01-10 ENCOUNTER — Encounter: Payer: Medicare HMO | Admitting: Internal Medicine

## 2023-01-10 DIAGNOSIS — I10 Essential (primary) hypertension: Secondary | ICD-10-CM

## 2023-01-10 DIAGNOSIS — Z0000 Encounter for general adult medical examination without abnormal findings: Secondary | ICD-10-CM

## 2023-01-10 DIAGNOSIS — I700 Atherosclerosis of aorta: Secondary | ICD-10-CM

## 2023-01-10 DIAGNOSIS — E876 Hypokalemia: Secondary | ICD-10-CM

## 2023-01-10 DIAGNOSIS — E7800 Pure hypercholesterolemia, unspecified: Secondary | ICD-10-CM

## 2023-01-10 DIAGNOSIS — E039 Hypothyroidism, unspecified: Secondary | ICD-10-CM

## 2023-01-10 DIAGNOSIS — N1831 Chronic kidney disease, stage 3a: Secondary | ICD-10-CM

## 2023-01-10 DIAGNOSIS — F419 Anxiety disorder, unspecified: Secondary | ICD-10-CM

## 2023-01-10 DIAGNOSIS — G4709 Other insomnia: Secondary | ICD-10-CM

## 2023-01-10 DIAGNOSIS — R739 Hyperglycemia, unspecified: Secondary | ICD-10-CM

## 2023-01-10 DIAGNOSIS — K219 Gastro-esophageal reflux disease without esophagitis: Secondary | ICD-10-CM

## 2023-01-10 NOTE — Assessment & Plan Note (Signed)
Chronic Check lipid panel  Continue simvastatin 40 mg daily  

## 2023-01-10 NOTE — Assessment & Plan Note (Signed)
Chronic Check a1c Low sugar / carb diet Stressed regular exercise  

## 2023-01-10 NOTE — Assessment & Plan Note (Signed)
Chronic Several medication have not been effective Back on seroquel 50 mg HS now

## 2023-01-10 NOTE — Assessment & Plan Note (Signed)
Chronic CMP 

## 2023-01-10 NOTE — Assessment & Plan Note (Signed)
Chronic GERD controlled Continue famotidine 40 mg daily, omeprazole 40 mg bid  

## 2023-01-10 NOTE — Assessment & Plan Note (Signed)
Chronic BP well controlled Continue amlodipine 5 mg daily  

## 2023-01-10 NOTE — Assessment & Plan Note (Signed)
Chronic  Clinically euthyroid Check tsh and will titrate med dose if needed Currently taking levothyroxine 125 mcg daily 

## 2023-01-10 NOTE — Progress Notes (Signed)
Subjective:    Patient ID: Dawn Kim, female    DOB: 1946-03-29, 77 y.o.   MRN: [id number]      HPI Dawn Kim is here for a Physical exam and her chronic medical problems.      Medications and allergies reviewed with patient and updated if appropriate.  Current Outpatient Medications on File Prior to Visit  Medication Sig Dispense Refill  . acetaminophen (TYLENOL) 325 MG tablet Take 650 mg by mouth every 6 (six) hours as needed for mild pain or moderate pain.     . calcium carbonate (OS-CAL - DOSED IN MG OF ELEMENTAL CALCIUM) 1250 (500 Ca) MG tablet Take 1 tablet (500 mg of elemental calcium total) by mouth daily.    . cholecalciferol (VITAMIN D) 1000 UNITS tablet Take 5,000 Units by mouth daily.    . ferrous sulfate 325 (65 FE) MG EC tablet Take 325 mg by mouth daily with breakfast.    . fish oil-omega-3 fatty acids 1000 MG capsule Take 1 g by mouth 2 (two) times daily.     [name] levothyroxine (SYNTHROID) 125 MCG tablet Take 1 tablet (125 mcg total) by mouth daily. For thyroid 90 tablet 3  . Multiple Vitamin (MULTIVITAMIN) tablet Take 1 tablet by mouth daily.    . QUEtiapine (SEROQUEL) 50 MG tablet Take 1 tablet (50 mg total) by mouth at bedtime. 30 tablet 5  . venlafaxine XR (EFFEXOR-XR) 150 MG 24 hr capsule TAKE 1 CAPSULE BY MOUTH ONCE DAILY WITH BREAKFAST 90 capsule 0   No current facility-administered medications on file prior to visit.    Review of Systems     Objective:  There were no vitals filed for this visit. There were no vitals filed for this visit. There is no height or weight on file to calculate BMI.  BP Readings from Last 3 Encounters:  01/19/23 (!) 144/76  11/19/22 138/80  11/10/22 (!) 142/71    Wt Readings from Last 3 Encounters:  03/02/22 179 lb (81.2 kg)  09/01/21 178 lb (80.7 kg)  03/30/21 186 lb 6.4 oz (84.6 kg)       Physical Exam Constitutional: She appears well-developed and well-nourished. No distress.  HENT:  Head: Normocephalic  and atraumatic.  Right Ear: External ear normal. Normal ear canal and TM Left Ear: External ear normal.  Normal ear canal and TM Mouth/Throat: Oropharynx is clear and moist.  Eyes: Conjunctivae normal.  Neck: Neck supple. No tracheal deviation present. No thyromegaly present.  No carotid bruit  Cardiovascular: Normal rate, regular rhythm and normal heart sounds.   No murmur heard.  No edema. Pulmonary/Chest: Effort normal and breath sounds normal. No respiratory distress. She has no wheezes. She has no rales.  Breast: deferred   Abdominal: Soft. She exhibits no distension. There is no tenderness.  Lymphadenopathy: She has no cervical adenopathy.  Skin: Skin is warm and dry. She is not diaphoretic.  Psychiatric: She has a normal mood and affect. Her behavior is normal.     Lab Results  Component Value Date   WBC 5.9 01/17/2023   HGB 13.3 01/17/2023   HCT 39.8 01/17/2023   PLT 288.0 01/17/2023   GLUCOSE 98 01/17/2023   CHOL 141 01/17/2023   TRIG 130.0 01/17/2023   HDL 51.10 01/17/2023   LDLDIRECT 86.0 09/01/2021   LDLCALC 64 01/17/2023   ALT 17 01/17/2023   AST 22 01/17/2023   NA 142 01/17/2023   K 3.5 01/17/2023   CL 104 01/17/2023   CREATININE  1.06 01/17/2023   BUN 10 01/17/2023   CO2 27 01/17/2023   TSH 0.66 01/17/2023   INR 2.33 (H) 12/02/2009   HGBA1C 5.2 01/17/2023         Assessment & Plan:   Physical exam: Screening blood work  ordered Exercise   Weight   Substance abuse  none   Reviewed recommended immunizations.   Health Maintenance  Topic Date Due  . Medicare Annual Wellness (AWV)  10/12/2019  . DEXA SCAN  08/09/2020  . COVID-19 Vaccine (5 - 2023-24 season) 11/13/2022  . INFLUENZA VACCINE  05/05/2023  . DTaP/Tdap/Td (2 - Td or Tdap) 07/31/2028  . Pneumonia Vaccine 36+ Years old  Completed  . Hepatitis C Screening  Completed  . Zoster Vaccines- Shingrix  Completed  . HPV VACCINES  Aged Out  . Fecal DNA (Cologuard)  Discontinued           See Problem List for Assessment and Plan of chronic medical problems.     This encounter was created in error - please disregard.

## 2023-01-10 NOTE — Assessment & Plan Note (Signed)
Chronic Anxiety has never truly been controlled per patient Currently taking Effexor 225 mg daily which she does not want to change and alprazolam 0.5 mg 3 times daily Continue current medications Advised looking into psychiatry for medication adjustment

## 2023-01-10 NOTE — Patient Instructions (Addendum)
Blood work was ordered.   The lab is on the first floor.    Medications changes include :       A referral was ordered for XXX.     Someone will call you to schedule an appointment.    Return in about 6 months (around 07/13/2023) for follow up.   Health Maintenance, Female Adopting a healthy lifestyle and getting preventive care are important in promoting health and wellness. Ask your health care provider about: The right schedule for you to have regular tests and exams. Things you can do on your own to prevent diseases and keep yourself healthy. What should I know about diet, weight, and exercise? Eat a healthy diet  Eat a diet that includes plenty of vegetables, fruits, low-fat dairy products, and lean protein. Do not eat a lot of foods that are high in solid fats, added sugars, or sodium. Maintain a healthy weight Body mass index (BMI) is used to identify weight problems. It estimates body fat based on height and weight. Your health care provider can help determine your BMI and help you achieve or maintain a healthy weight. Get regular exercise Get regular exercise. This is one of the most important things you can do for your health. Most adults should: Exercise for at least 150 minutes each week. The exercise should increase your heart rate and make you sweat (moderate-intensity exercise). Do strengthening exercises at least twice a week. This is in addition to the moderate-intensity exercise. Spend less time sitting. Even light physical activity can be beneficial. Watch cholesterol and blood lipids Have your blood tested for lipids and cholesterol at 79 years of age, then have this test every 5 years. Have your cholesterol levels checked more often if: Your lipid or cholesterol levels are high. You are older than 79 years of age. You are at high risk for heart disease. What should I know about cancer screening? Depending on your health history and family history,  you may need to have cancer screening at various ages. This may include screening for: Breast cancer. Cervical cancer. Colorectal cancer. Skin cancer. Lung cancer. What should I know about heart disease, diabetes, and high blood pressure? Blood pressure and heart disease High blood pressure causes heart disease and increases the risk of stroke. This is more likely to develop in people who have high blood pressure readings or are overweight. Have your blood pressure checked: Every 3-5 years if you are 24-52 years of age. Every year if you are 102 years old or older. Diabetes Have regular diabetes screenings. This checks your fasting blood sugar level. Have the screening done: Once every three years after age 67 if you are at a normal weight and have a low risk for diabetes. More often and at a younger age if you are overweight or have a high risk for diabetes. What should I know about preventing infection? Hepatitis B If you have a higher risk for hepatitis B, you should be screened for this virus. Talk with your health care provider to find out if you are at risk for hepatitis B infection. Hepatitis C Testing is recommended for: Everyone born from [birth year] through 1965. Anyone with known risk factors for hepatitis C. Sexually transmitted infections (STIs) Get screened for STIs, including gonorrhea and chlamydia, if: You are sexually active and are younger than 79 years of age. You are older than 79 years of age and your health care provider tells you that you are at  increased risk for chlamydia or gonorrhea. Ask your health care provider if you are at risk. Ask your health care provider about whether you are at high risk for HIV. Your health care provider may recommend a prescription medicine to help prevent HIV infection. If you choose to take medicine to prevent HIV, you should first get tested for HIV. You should then be tested every 3 months for as long as you  are taking the medicine. Pregnancy If you are about to stop having your period (premenopausal) and you may become pregnant, seek counseling before you get pregnant. Take 400 to 800 micrograms (mcg) of folic acid every day if you become pregnant. Ask for birth control (contraception) if you want to prevent pregnancy. Osteoporosis and menopause Osteoporosis is a disease in which the bones lose minerals and strength with aging. This can result in bone fractures. If you are 65 years old or older, or if you are at risk for osteoporosis and fractures, ask your health care provider if you should: Be screened for bone loss. Take a calcium or vitamin D supplement to lower your risk of fractures. Be given hormone replacement therapy (HRT) to treat symptoms of menopause. Follow these instructions at home: Alcohol use Do not drink alcohol if: Your health care provider tells you not to drink. You are pregnant, may be pregnant, or are planning to become pregnant. If you drink alcohol: Limit how much you have to: 0-1 drink a day. Know how much alcohol is in your drink. In the [place] one drink equals one 12 oz bottle of beer (355 mL), one 5 oz glass of wine (148 mL), or one 1 oz glass of hard liquor (44 mL). Lifestyle Do not use any products that contain nicotine or tobacco. These products include cigarettes, chewing tobacco, and vaping devices, such as e-cigarettes. If you need help quitting, ask your health care provider. Do not use street drugs. Do not share needles. Ask your health care provider for help if you need support or information about quitting drugs. General instructions Schedule regular health, dental, and eye exams. Stay current with your vaccines. Tell your health care provider if: You often feel depressed. You have ever been abused or do not feel safe at home. Summary Adopting a healthy lifestyle and getting preventive care are important in promoting health and wellness. Follow your  health care provider's instructions about healthy diet, exercising, and getting tested or screened for diseases. Follow your health care provider's instructions on monitoring your cholesterol and blood pressure. This information is not intended to replace advice given to you by your health care provider. Make sure you discuss any questions you have with your health care provider. Document Revised: 02/09/2021 Document Reviewed: 02/09/2021 Elsevier Patient Education  2023 [employer]  

## 2023-01-10 NOTE — Assessment & Plan Note (Signed)
Chronic Secondary to diuretic Continue potassium potassium chloride 20 mill equivalents daily

## 2023-01-10 NOTE — Assessment & Plan Note (Signed)
Chronic History of compression fracture, fracture of humerus greater tuberosity, tibia fracture DEXA due-ordered High risk of falls Discussed fall prevention-uses walker Encouraged regular exercise with walker Continue calcium and vitamin D Check vitamin D level

## 2023-01-10 NOTE — Assessment & Plan Note (Signed)
Chronic Continue simvastatin 40 mg daily Encouraged healthy diet 

## 2023-01-11 ENCOUNTER — Encounter: Payer: Medicare HMO | Admitting: Internal Medicine

## 2023-01-11 ENCOUNTER — Encounter: Payer: Self-pay | Admitting: Internal Medicine

## 2023-01-11 ENCOUNTER — Telehealth: Payer: Self-pay | Admitting: Internal Medicine

## 2023-01-11 DIAGNOSIS — R296 Repeated falls: Secondary | ICD-10-CM

## 2023-01-11 DIAGNOSIS — R739 Hyperglycemia, unspecified: Secondary | ICD-10-CM

## 2023-01-11 DIAGNOSIS — F419 Anxiety disorder, unspecified: Secondary | ICD-10-CM

## 2023-01-11 DIAGNOSIS — N1831 Chronic kidney disease, stage 3a: Secondary | ICD-10-CM

## 2023-01-11 DIAGNOSIS — E7800 Pure hypercholesterolemia, unspecified: Secondary | ICD-10-CM

## 2023-01-11 DIAGNOSIS — I700 Atherosclerosis of aorta: Secondary | ICD-10-CM

## 2023-01-11 DIAGNOSIS — G4709 Other insomnia: Secondary | ICD-10-CM

## 2023-01-11 DIAGNOSIS — E876 Hypokalemia: Secondary | ICD-10-CM

## 2023-01-11 DIAGNOSIS — I10 Essential (primary) hypertension: Secondary | ICD-10-CM

## 2023-01-11 DIAGNOSIS — M85839 Other specified disorders of bone density and structure, unspecified forearm: Secondary | ICD-10-CM

## 2023-01-11 DIAGNOSIS — K219 Gastro-esophageal reflux disease without esophagitis: Secondary | ICD-10-CM

## 2023-01-11 DIAGNOSIS — E039 Hypothyroidism, unspecified: Secondary | ICD-10-CM

## 2023-01-11 DIAGNOSIS — R413 Other amnesia: Secondary | ICD-10-CM

## 2023-01-11 DIAGNOSIS — Z0000 Encounter for general adult medical examination without abnormal findings: Secondary | ICD-10-CM

## 2023-01-11 NOTE — Assessment & Plan Note (Signed)
Chronic Mild, stable CMP

## 2023-01-11 NOTE — Assessment & Plan Note (Signed)
Chronic Check a1c Low sugar / carb diet Stressed regular exercise  

## 2023-01-11 NOTE — Assessment & Plan Note (Signed)
Chronic Continue simvastatin 40 mg daily Encouraged healthy diet 

## 2023-01-11 NOTE — Assessment & Plan Note (Signed)
Chronic Anxiety has never truly been controlled per patient Currently taking Effexor 225 mg daily which she does not want to change and alprazolam 0.5 mg 3 times daily Continue current medications Advised looking into psychiatry for medication adjustment

## 2023-01-11 NOTE — Assessment & Plan Note (Signed)
Chronic  Most recent TSH was elevated and due for repeat Check tsh and will titrate med dose if needed Currently taking levothyroxine 125 mcg daily

## 2023-01-11 NOTE — Assessment & Plan Note (Signed)
Chronic Several medication have not been effective Back on seroquel 50 mg HS now

## 2023-01-11 NOTE — Assessment & Plan Note (Signed)
Chronic BP well controlled CMP, CBC Continue amlodipine 5 mg daily

## 2023-01-11 NOTE — Assessment & Plan Note (Signed)
Chronic Check lipid panel  Continue simvastatin 40 mg daily  

## 2023-01-11 NOTE — Assessment & Plan Note (Signed)
Chronic History of compression fracture, fracture of humerus greater tuberosity, tibia fracture DEXA due-ordered High risk of falls Discussed fall prevention-uses walker Encouraged regular exercise with walker Continue calcium and vitamin D Check vitamin D level

## 2023-01-11 NOTE — Assessment & Plan Note (Signed)
Chronic GERD controlled Continue famotidine 40 mg daily, omeprazole 40 mg bid  

## 2023-01-11 NOTE — Telephone Encounter (Signed)
Patient's niece Melody called and said patient would like orders for blood work put in prior to her appointment on 01/19/23. Melody would like a callback to schedule the blood work at [phone].

## 2023-01-11 NOTE — Assessment & Plan Note (Signed)
Chronic Secondary to diuretic Continue potassium potassium chloride 20 mEq daily

## 2023-01-12 DIAGNOSIS — R413 Other amnesia: Secondary | ICD-10-CM | POA: Insufficient documentation

## 2023-01-12 NOTE — Telephone Encounter (Signed)
Lab appointment made for Monday.

## 2023-01-12 NOTE — Telephone Encounter (Signed)
Blood work ordered.

## 2023-01-15 ENCOUNTER — Other Ambulatory Visit: Payer: Self-pay | Admitting: Internal Medicine

## 2023-01-17 ENCOUNTER — Other Ambulatory Visit (INDEPENDENT_AMBULATORY_CARE_PROVIDER_SITE_OTHER): Payer: Medicare HMO

## 2023-01-17 ENCOUNTER — Other Ambulatory Visit: Payer: Self-pay

## 2023-01-17 DIAGNOSIS — E039 Hypothyroidism, unspecified: Secondary | ICD-10-CM | POA: Diagnosis not present

## 2023-01-17 DIAGNOSIS — M85839 Other specified disorders of bone density and structure, unspecified forearm: Secondary | ICD-10-CM

## 2023-01-17 DIAGNOSIS — N1831 Chronic kidney disease, stage 3a: Secondary | ICD-10-CM | POA: Diagnosis not present

## 2023-01-17 DIAGNOSIS — R739 Hyperglycemia, unspecified: Secondary | ICD-10-CM

## 2023-01-17 DIAGNOSIS — R296 Repeated falls: Secondary | ICD-10-CM | POA: Diagnosis not present

## 2023-01-17 DIAGNOSIS — R413 Other amnesia: Secondary | ICD-10-CM | POA: Diagnosis not present

## 2023-01-17 DIAGNOSIS — I10 Essential (primary) hypertension: Secondary | ICD-10-CM | POA: Diagnosis not present

## 2023-01-17 DIAGNOSIS — E7800 Pure hypercholesterolemia, unspecified: Secondary | ICD-10-CM

## 2023-01-17 LAB — CBC WITH DIFFERENTIAL/PLATELET
Basophils Absolute: 0 10*3/uL (ref 0.0–0.1)
Basophils Relative: 0.2 % (ref 0.0–3.0)
Eosinophils Absolute: 0.1 10*3/uL (ref 0.0–0.7)
Eosinophils Relative: 2.1 % (ref 0.0–5.0)
HCT: 39.8 % (ref 36.0–46.0)
Hemoglobin: 13.3 g/dL (ref 12.0–15.0)
Lymphocytes Relative: 26.3 % (ref 12.0–46.0)
Lymphs Abs: 1.5 10*3/uL (ref 0.7–4.0)
MCHC: 33.5 g/dL (ref 30.0–36.0)
MCV: 92.4 fl (ref 78.0–100.0)
Monocytes Absolute: 0.5 10*3/uL (ref 0.1–1.0)
Monocytes Relative: 8.2 % (ref 3.0–12.0)
Neutro Abs: 3.7 10*3/uL (ref 1.4–7.7)
Neutrophils Relative %: 63.2 % (ref 43.0–77.0)
Platelets: 288 10*3/uL (ref 150.0–400.0)
RBC: 4.3 Mil/uL (ref 3.87–5.11)
RDW: 13.4 % (ref 11.5–15.5)
WBC: 5.9 10*3/uL (ref 4.0–10.5)

## 2023-01-17 LAB — VITAMIN B12: Vitamin B-12: 771 pg/mL (ref 211–911)

## 2023-01-17 LAB — COMPREHENSIVE METABOLIC PANEL
ALT: 17 U/L (ref 0–35)
AST: 22 U/L (ref 0–37)
Albumin: 4.3 g/dL (ref 3.5–5.2)
Alkaline Phosphatase: 34 U/L — ABNORMAL LOW (ref 39–117)
BUN: 10 mg/dL (ref 6–23)
CO2: 27 mEq/L (ref 19–32)
Calcium: 9.4 mg/dL (ref 8.4–10.5)
Chloride: 104 mEq/L (ref 96–112)
Creatinine, Ser: 1.06 mg/dL (ref 0.40–1.20)
GFR: 50.27 mL/min — ABNORMAL LOW (ref >60.00)
Glucose, Bld: 98 mg/dL (ref 70–99)
Potassium: 3.5 mEq/L (ref 3.5–5.1)
Sodium: 142 mEq/L (ref 135–145)
Total Bilirubin: 0.5 mg/dL (ref 0.2–1.2)
Total Protein: 6.6 g/dL (ref 6.0–8.3)

## 2023-01-17 LAB — VITAMIN D 25 HYDROXY (VIT D DEFICIENCY, FRACTURES): VITD: 50.61 ng/mL (ref 30.00–100.00)

## 2023-01-17 LAB — HEMOGLOBIN A1C: Hgb A1c MFr Bld: 5.2 % (ref 4.6–6.5)

## 2023-01-17 LAB — LIPID PANEL
Cholesterol: 141 mg/dL (ref 0–200)
HDL: 51.1 mg/dL (ref >39.00)
LDL Cholesterol: 64 mg/dL (ref 0–99)
NonHDL: 89.77
Total CHOL/HDL Ratio: 3
Triglycerides: 130 mg/dL (ref 0.0–149.0)
VLDL: 26 mg/dL (ref 0.0–40.0)

## 2023-01-17 LAB — TSH: TSH: 0.66 u[IU]/mL (ref 0.35–5.50)

## 2023-01-18 ENCOUNTER — Encounter: Payer: Self-pay | Admitting: Internal Medicine

## 2023-01-18 NOTE — Progress Notes (Unsigned)
Subjective:    Patient ID: Dawn Kim, female    DOB: 04/08/1946, 77 y.o.   MRN: [id number]      HPI Dawn Kim is here for a Physical exam and her chronic medical problems.   Had labs   Medications and allergies reviewed with patient and updated if appropriate.  Current Outpatient Medications on File Prior to Visit  Medication Sig Dispense Refill   acetaminophen (TYLENOL) 325 MG tablet Take 650 mg by mouth every 6 (six) hours as needed for mild pain or moderate pain.      ALPRAZolam (XANAX) 0.5 MG tablet TAKE 1 TABLET BY MOUTH THREE TIMES DAILY AS NEEDED FOR  ANXIETY.  DO  NOT  EXCEED  3  TABS  PER  24  HOURS--  DO  NOT  FILL  EARLY 90 tablet 0   amLODipine (NORVASC) 5 MG tablet Take 1 tablet (5 mg total) by mouth daily. For high blood pressure 90 tablet 0   calcium carbonate (OS-CAL - DOSED IN MG OF ELEMENTAL CALCIUM) 1250 (500 Ca) MG tablet Take 1 tablet (500 mg of elemental calcium total) by mouth daily.     cholecalciferol (VITAMIN D) 1000 UNITS tablet Take 1,000 Units by mouth daily.     cilostazol (PLETAL) 50 MG tablet Take 0.5 tablets (25 mg total) by mouth 2 (two) times daily. For circulation 60 tablet 5   famotidine (PEPCID) 40 MG tablet TAKE 1 TABLET BY MOUTH AT BEDTIME 90 tablet 0   ferrous sulfate 325 (65 FE) MG EC tablet Take 325 mg by mouth daily with breakfast.     fish oil-omega-3 fatty acids 1000 MG capsule Take 1 g by mouth 2 (two) times daily.      furosemide (LASIX) 20 MG tablet Take 1 tablet by mouth once daily 30 tablet 0   levocetirizine (XYZAL) 5 MG tablet TAKE 1 TABLET BY MOUTH ONCE DAILY IN THE EVENING 90 tablet 0   levothyroxine (SYNTHROID) 125 MCG tablet Take 1 tablet (125 mcg total) by mouth daily. For thyroid 90 tablet 3   Multiple Vitamin (MULTIVITAMIN) tablet Take 1 tablet by mouth daily.     omeprazole (PRILOSEC) 40 MG capsule TAKE 1 CAPSULE BY MOUTH TWICE DAILY BEFORE A MEAL 180 capsule 0   potassium chloride (KLOR-CON M) 10 MEQ tablet Take 2  tablets by mouth once daily 60 tablet 0   QUEtiapine (SEROQUEL) 50 MG tablet Take 1 tablet (50 mg total) by mouth at bedtime. 30 tablet 5   simvastatin (ZOCOR) 40 MG tablet Take 1 tablet by mouth in the evening 90 tablet 0   venlafaxine XR (EFFEXOR-XR) 150 MG 24 hr capsule TAKE 1 CAPSULE BY MOUTH ONCE DAILY WITH BREAKFAST 90 capsule 0   venlafaxine XR (EFFEXOR-XR) 75 MG 24 hr capsule TAKE 1 CAPSULE BY MOUTH ONCE DAILY WITH BREAKFAST OF 225 MG DAILY 90 capsule 0   No current facility-administered medications on file prior to visit.    Review of Systems     Objective:  There were no vitals filed for this visit. There were no vitals filed for this visit. There is no height or weight on file to calculate BMI.  BP Readings from Last 3 Encounters:  11/19/22 138/80  11/10/22 (!) 142/71  08/02/22 125/78    Wt Readings from Last 3 Encounters:  03/02/22 179 lb (81.2 kg)  09/01/21 178 lb (80.7 kg)  03/30/21 186 lb 6.4 oz (84.6 kg)       Physical Exam Constitutional:  She appears well-developed and well-nourished. No distress.  HENT:  Head: Normocephalic and atraumatic.  Right Ear: External ear normal. Normal ear canal and TM Left Ear: External ear normal.  Normal ear canal and TM Mouth/Throat: Oropharynx is clear and moist.  Eyes: Conjunctivae normal.  Neck: Neck supple. No tracheal deviation present. No thyromegaly present.  No carotid bruit  Cardiovascular: Normal rate, regular rhythm and normal heart sounds.   No murmur heard.  No edema. Pulmonary/Chest: Effort normal and breath sounds normal. No respiratory distress. She has no wheezes. She has no rales.  Breast: deferred   Abdominal: Soft. She exhibits no distension. There is no tenderness.  Lymphadenopathy: She has no cervical adenopathy.  Skin: Skin is warm and dry. She is not diaphoretic.  Psychiatric: She has a normal mood and affect. Her behavior is normal.     Lab Results  Component Value Date   WBC 5.9 01/17/2023    HGB 13.3 01/17/2023   HCT 39.8 01/17/2023   PLT 288.0 01/17/2023   GLUCOSE 98 01/17/2023   CHOL 141 01/17/2023   TRIG 130.0 01/17/2023   HDL 51.10 01/17/2023   LDLDIRECT 86.0 09/01/2021   LDLCALC 64 01/17/2023   ALT 17 01/17/2023   AST 22 01/17/2023   NA 142 01/17/2023   K 3.5 01/17/2023   CL 104 01/17/2023   CREATININE 1.06 01/17/2023   BUN 10 01/17/2023   CO2 27 01/17/2023   TSH 0.66 01/17/2023   INR 2.33 (H) 12/02/2009   HGBA1C 5.2 01/17/2023         Assessment & Plan:   Physical exam: Screening blood work  reviewed Exercise   Weight   Substance abuse  none   Reviewed recommended immunizations.   Health Maintenance  Topic Date Due   Medicare Annual Wellness (AWV)  10/12/2019   DEXA SCAN  08/09/2020   INFLUENZA VACCINE  05/05/2023   DTaP/Tdap/Td (2 - Td or Tdap) 07/31/2028   Pneumonia Vaccine 19+ Years old  Completed   COVID-19 Vaccine  Completed   Hepatitis C Screening  Completed   Zoster Vaccines- Shingrix  Completed   HPV VACCINES  Aged Out   Fecal DNA (Cologuard)  Discontinued          See Problem List for Assessment and Plan of chronic medical problems.

## 2023-01-18 NOTE — Patient Instructions (Addendum)
Have an xray downstairs.    Medications changes include :   stop amlodipine and start valsartan 80 mg daily ( for blood pressure) - that may improve your leg swelling.  Stop pletal (cilostazol) - no longer needed    Elevate your legs when you sit.  Eat a low salt diet. Start wearing compression socks.  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      Do your exercises for your left shoulder.       Return in about 6 months (around 07/21/2023) for follow up, Schedule DEXA-[place].     Health Maintenance, Female Adopting a healthy lifestyle and getting preventive care are important in promoting health and wellness. Ask your health care provider about: The right schedule for you to have regular tests and exams. Things you can do on your own to prevent diseases and keep yourself healthy. What should I know about diet, weight, and exercise? Eat a healthy diet  Eat a diet that includes plenty of vegetables, fruits, low-fat dairy products, and lean protein. Do not eat a lot of foods that are high in solid fats, added sugars, or sodium. Maintain a healthy weight Body mass index (BMI) is used to identify weight problems. It estimates body fat based on height and weight. Your health care provider can help determine your BMI and help you achieve or maintain a healthy weight. Get regular exercise Get regular exercise. This is one of the most important things you can do for your health. Most adults should: Exercise for at least 150 minutes each week. The exercise should increase your heart rate and make you sweat (moderate-intensity exercise). Do strengthening exercises at least twice a week. This is in addition to the moderate-intensity exercise. Spend less time sitting. Even light physical activity can be beneficial. Watch cholesterol and blood lipids Have your blood tested for lipids and cholesterol at 79 years of age, then have this test every 5 years. Have your  cholesterol levels checked more often if: Your lipid or cholesterol levels are high. You are older than 79 years of age. You are at high risk for heart disease. What should I know about cancer screening? Depending on your health history and family history, you may need to have cancer screening at various ages. This may include screening for: Breast cancer. Cervical cancer. Colorectal cancer. Skin cancer. Lung cancer. What should I know about heart disease, diabetes, and high blood pressure? Blood pressure and heart disease High blood pressure causes heart disease and increases the risk of stroke. This is more likely to develop in people who have high blood pressure readings or are overweight. Have your blood pressure checked: Every 3-5 years if you are 24-62 years of age. Every year if you are 76 years old or older. Diabetes Have regular diabetes screenings. This checks your fasting blood sugar level. Have the screening done: Once every three years after age 76 if you are at a normal weight and have a low risk for diabetes. More often and at a younger age if you are overweight or have a high risk for diabetes. What should I know about preventing infection? Hepatitis B If you have a higher risk for hepatitis B, you should be screened for this virus. Talk with your health care provider to find out if you are at risk for hepatitis B infection. Hepatitis C Testing is recommended for: Everyone born from [birth year] through 1967. Anyone with known risk factors for hepatitis C. Sexually  transmitted infections (STIs) Get screened for STIs, including gonorrhea and chlamydia, if: You are sexually active and are younger than 79 years of age. You are older than 79 years of age and your health care provider tells you that you are at risk for this type of infection. Your sexual activity has changed since you were last screened, and you are at increased risk for chlamydia or gonorrhea. Ask your health care  provider if you are at risk. Ask your health care provider about whether you are at high risk for HIV. Your health care provider may recommend a prescription medicine to help prevent HIV infection. If you choose to take medicine to prevent HIV, you should first get tested for HIV. You should then be tested every 3 months for as long as you are taking the medicine. Pregnancy If you are about to stop having your period (premenopausal) and you may become pregnant, seek counseling before you get pregnant. Take 400 to 800 micrograms (mcg) of folic acid every day if you become pregnant. Ask for birth control (contraception) if you want to prevent pregnancy. Osteoporosis and menopause Osteoporosis is a disease in which the bones lose minerals and strength with aging. This can result in bone fractures. If you are 98 years old or older, or if you are at risk for osteoporosis and fractures, ask your health care provider if you should: Be screened for bone loss. Take a calcium or vitamin D supplement to lower your risk of fractures. Be given hormone replacement therapy (HRT) to treat symptoms of menopause. Follow these instructions at home: Alcohol use Do not drink alcohol if: Your health care provider tells you not to drink. You are pregnant, may be pregnant, or are planning to become pregnant. If you drink alcohol: Limit how much you have to: 0-1 drink a day. Know how much alcohol is in your drink. In the [place] one drink equals one 12 oz bottle of beer (355 mL), one 5 oz glass of wine (148 mL), or one 1 oz glass of hard liquor (44 mL). Lifestyle Do not use any products that contain nicotine or tobacco. These products include cigarettes, chewing tobacco, and vaping devices, such as e-cigarettes. If you need help quitting, ask your health care provider. Do not use street drugs. Do not share needles. Ask your health care provider for help if you need support or information about quitting drugs. General  instructions Schedule regular health, dental, and eye exams. Stay current with your vaccines. Tell your health care provider if: You often feel depressed. You have ever been abused or do not feel safe at home. Summary Adopting a healthy lifestyle and getting preventive care are important in promoting health and wellness. Follow your health care provider's instructions about healthy diet, exercising, and getting tested or screened for diseases. Follow your health care provider's instructions on monitoring your cholesterol and blood pressure. This information is not intended to replace advice given to you by your health care provider. Make sure you discuss any questions you have with your health care provider. Document Revised: 02/09/2021 Document Reviewed: 02/09/2021 Elsevier Patient Education  2023 [employer].

## 2023-01-19 ENCOUNTER — Ambulatory Visit (INDEPENDENT_AMBULATORY_CARE_PROVIDER_SITE_OTHER): Payer: Medicare HMO | Admitting: Internal Medicine

## 2023-01-19 ENCOUNTER — Ambulatory Visit (INDEPENDENT_AMBULATORY_CARE_PROVIDER_SITE_OTHER): Payer: Medicare HMO

## 2023-01-19 VITALS — BP 144/76 | HR 77 | Temp 98.2°F | Ht 63.0 in

## 2023-01-19 DIAGNOSIS — Z0000 Encounter for general adult medical examination without abnormal findings: Secondary | ICD-10-CM

## 2023-01-19 DIAGNOSIS — E039 Hypothyroidism, unspecified: Secondary | ICD-10-CM

## 2023-01-19 DIAGNOSIS — M25512 Pain in left shoulder: Secondary | ICD-10-CM

## 2023-01-19 DIAGNOSIS — G8929 Other chronic pain: Secondary | ICD-10-CM

## 2023-01-19 DIAGNOSIS — E7800 Pure hypercholesterolemia, unspecified: Secondary | ICD-10-CM

## 2023-01-19 DIAGNOSIS — N1831 Chronic kidney disease, stage 3a: Secondary | ICD-10-CM | POA: Diagnosis not present

## 2023-01-19 DIAGNOSIS — G4709 Other insomnia: Secondary | ICD-10-CM | POA: Diagnosis not present

## 2023-01-19 DIAGNOSIS — K219 Gastro-esophageal reflux disease without esophagitis: Secondary | ICD-10-CM

## 2023-01-19 DIAGNOSIS — R296 Repeated falls: Secondary | ICD-10-CM

## 2023-01-19 DIAGNOSIS — F419 Anxiety disorder, unspecified: Secondary | ICD-10-CM | POA: Diagnosis not present

## 2023-01-19 DIAGNOSIS — M85839 Other specified disorders of bone density and structure, unspecified forearm: Secondary | ICD-10-CM

## 2023-01-19 DIAGNOSIS — M19012 Primary osteoarthritis, left shoulder: Secondary | ICD-10-CM | POA: Diagnosis not present

## 2023-01-19 DIAGNOSIS — R739 Hyperglycemia, unspecified: Secondary | ICD-10-CM

## 2023-01-19 DIAGNOSIS — I700 Atherosclerosis of aorta: Secondary | ICD-10-CM

## 2023-01-19 DIAGNOSIS — R600 Localized edema: Secondary | ICD-10-CM

## 2023-01-19 DIAGNOSIS — I10 Essential (primary) hypertension: Secondary | ICD-10-CM | POA: Diagnosis not present

## 2023-01-19 DIAGNOSIS — F32A Depression, unspecified: Secondary | ICD-10-CM

## 2023-01-19 MED ORDER — POTASSIUM CHLORIDE CRYS ER 10 MEQ PO TBCR: 20.0000 meq | EXTENDED_RELEASE_TABLET | Freq: Every day | ORAL | 1 refills | Status: AC

## 2023-01-19 MED ORDER — VALSARTAN 80 MG PO TABS
80.0000 mg | ORAL_TABLET | Freq: Every day | ORAL | 3 refills | Status: DC
Start: 2023-01-19 — End: 2023-10-17

## 2023-01-19 MED ORDER — FUROSEMIDE 20 MG PO TABS: 20.0000 mg | ORAL_TABLET | Freq: Every day | ORAL | 1 refills | Status: DC

## 2023-01-19 NOTE — Assessment & Plan Note (Signed)
Chronic GERD controlled Continue famotidine 40 mg daily, omeprazole 40 mg bid  

## 2023-01-19 NOTE — Assessment & Plan Note (Signed)
Chronic CMP, CBC 

## 2023-01-19 NOTE — Assessment & Plan Note (Signed)
Chronic DEXA due-ordered Not exercising regularly Continue calcium and vitamin D supplementation

## 2023-01-19 NOTE — Assessment & Plan Note (Signed)
Chronic Continue simvastatin 40 mg daily Encouraged healthy diet 

## 2023-01-19 NOTE — Assessment & Plan Note (Signed)
Chronic Check lipid panel  Continue simvastatin 40 mg daily  

## 2023-01-19 NOTE — Assessment & Plan Note (Signed)
Chronic Check a1c Low sugar / carb diet Stressed regular exercise  

## 2023-01-19 NOTE — Assessment & Plan Note (Signed)
Chronic BP slightly elevated - ok for age Has leg edema - doubt amlodipine is contributing much but will stop and start diovan 80 mg daily She is sedentary - advised elevating legs consistently Low sodium diet Walk as much as possible

## 2023-01-19 NOTE — Assessment & Plan Note (Signed)
Chronic Several medication have not been effective Continue seroquel 50 mg HS  I would like to avoid increasing this Discussed 1 cup of coffee in the morning-eliminate all other caffeine-she does admit that she has been drinking some caffeine during the day Advise not napping during the day-she states she does do this and is probably more than she realizes which is affecting her sleep at night

## 2023-01-19 NOTE — Assessment & Plan Note (Addendum)
Chronic  elevate legs as much as possible when sitting She is sedentary  - advised walking as much as possible  Low sodium diet Will stop amlodipine 5 mg daily Continue lasix 20 mg daily Start wearing compression socks

## 2023-01-19 NOTE — Assessment & Plan Note (Signed)
Chronic Anxiety has never truly been controlled per patient, but okay now Currently taking Effexor 225 mg daily which she does not want to change and alprazolam 0.5 mg 3 times daily We had discussed her seeing a psychiatrist to help better control her anxiety and sleep

## 2023-01-19 NOTE — Assessment & Plan Note (Addendum)
chronic Has had 2 dislocations in the shoulder Has decreased range of motion and pain-last dislocation 2 months ago Discussed sports medicine referral, physical therapy She does not know some exercises that she can deal and advised her to start doing them at home Will get an x-ray today

## 2023-01-19 NOTE — Assessment & Plan Note (Signed)
Chronic  Clinically euthyroid Check tsh and will titrate med dose if needed Currently taking levothyroxine 125 mcg daily 

## 2023-01-21 ENCOUNTER — Telehealth: Payer: Self-pay | Admitting: Internal Medicine

## 2023-01-21 NOTE — Telephone Encounter (Signed)
PT calls today following up on their recent results for their x-ray. I had informed PT that we had received the imaging back but that it had not been reviewed yet.  CB: [phone]

## 2023-01-21 NOTE — Telephone Encounter (Signed)
Pt called back inquiring about her xray results. Pt also stated that she is having a lot of pain in her shoulders as well. Please call pt back with update.

## 2023-01-21 NOTE — Telephone Encounter (Signed)
Message left for patient that we will notify her of results once Dr. Lawerance Bach has reviewed them so I can advise.

## 2023-01-23 NOTE — Telephone Encounter (Signed)
See result note.  

## 2023-01-24 ENCOUNTER — Telehealth: Payer: Self-pay | Admitting: Internal Medicine

## 2023-01-24 MED ORDER — VENLAFAXINE HCL ER 75 MG PO CP24: ORAL_CAPSULE | ORAL | 1 refills | Status: DC

## 2023-01-24 NOTE — Telephone Encounter (Addendum)
Refill has been sent to [facility] 

## 2023-01-24 NOTE — Telephone Encounter (Signed)
Patient would like a callback about x-ray results. Best callback is [phone].

## 2023-01-24 NOTE — Telephone Encounter (Signed)
Left message for patient to return call to clinic.  If she calls back okay to give her info below:  Her shoulder x-ray shows an impaction fracture which is related to having a dislocation in the shoulder.  The joint is in place.  This type of fracture does not require surgery - we can have her see sports medicine for review of the xray and to discuss conservative treatment.

## 2023-01-24 NOTE — Telephone Encounter (Signed)
Prescription Request  01/24/2023  LOV: 01/19/2023  What is the name of the medication or equipment? venlafaxine XR (EFFEXOR-XR) 75 MG 24 hr capsule   Have you contacted your pharmacy to request a refill? No   Which pharmacy would you like this sent to?  [facility] on [facility]  Patient notified that their request is being sent to the clinical staff for review and that they should receive a response within 2 business days.   Please advise at [facility] [phone]

## 2023-01-25 ENCOUNTER — Other Ambulatory Visit: Payer: Self-pay | Admitting: Internal Medicine

## 2023-01-25 DIAGNOSIS — F419 Anxiety disorder, unspecified: Secondary | ICD-10-CM

## 2023-01-26 NOTE — Telephone Encounter (Signed)
Patient called back and said that pharmacy told her that she has to have a prior authorization submitted

## 2023-01-27 ENCOUNTER — Telehealth: Payer: Self-pay

## 2023-01-27 ENCOUNTER — Other Ambulatory Visit (HOSPITAL_COMMUNITY): Payer: Self-pay

## 2023-01-27 NOTE — Telephone Encounter (Signed)
PA submitted via CMM. Key: [id number]. Status pending.

## 2023-01-27 NOTE — Telephone Encounter (Signed)
Pharmacy Patient Advocate Encounter   Received notification from [facility] that prior authorization for ALPRAZolam 0.5MG  tablets is required/requested.   PA submitted on 01/27/23 to (ins) [employer] via CoverMyMeds Key or (Medicaid) confirmation # [id number] Status is pending

## 2023-01-27 NOTE — Telephone Encounter (Signed)
Pt states she is needing a PA for her   ALPRAZolam (XANAX) 0.5 MG tablet

## 2023-01-28 ENCOUNTER — Other Ambulatory Visit (HOSPITAL_COMMUNITY): Payer: Self-pay

## 2023-01-28 NOTE — Telephone Encounter (Signed)
Patient Advocate Encounter  Prior Authorization for ALPRAZolam 0.5MG  tablets  has been approved with [employer].    Per [facility] test claim, copay for 30 days supply is $0  Effective dates: 01/27/23 through 10/04/23

## 2023-01-28 NOTE — Telephone Encounter (Signed)
PA approved. Effective dates: 01/27/23 through 10/04/23

## 2023-02-17 ENCOUNTER — Ambulatory Visit: Payer: Medicare HMO | Admitting: Internal Medicine

## 2023-02-22 ENCOUNTER — Other Ambulatory Visit: Payer: Self-pay | Admitting: Internal Medicine

## 2023-02-22 DIAGNOSIS — F419 Anxiety disorder, unspecified: Secondary | ICD-10-CM

## 2023-02-23 ENCOUNTER — Other Ambulatory Visit: Payer: Self-pay | Admitting: Internal Medicine

## 2023-03-10 ENCOUNTER — Other Ambulatory Visit: Payer: Self-pay | Admitting: Internal Medicine

## 2023-03-11 ENCOUNTER — Other Ambulatory Visit: Payer: Self-pay | Admitting: Internal Medicine

## 2023-03-20 ENCOUNTER — Other Ambulatory Visit: Payer: Self-pay | Admitting: Internal Medicine

## 2023-04-30 ENCOUNTER — Other Ambulatory Visit: Payer: Self-pay | Admitting: Internal Medicine

## 2023-05-16 ENCOUNTER — Other Ambulatory Visit: Payer: Self-pay | Admitting: Internal Medicine

## 2023-05-16 DIAGNOSIS — F419 Anxiety disorder, unspecified: Secondary | ICD-10-CM

## 2023-05-22 ENCOUNTER — Other Ambulatory Visit: Payer: Self-pay

## 2023-05-22 ENCOUNTER — Emergency Department (HOSPITAL_COMMUNITY): Payer: Medicare HMO

## 2023-05-22 ENCOUNTER — Emergency Department (HOSPITAL_COMMUNITY)
Admission: EM | Admit: 2023-05-22 | Discharge: 2023-05-22 | Disposition: A | Discharge status: Home / Self Care | Payer: Medicare HMO | Attending: Emergency Medicine | Admitting: Emergency Medicine

## 2023-05-22 ENCOUNTER — Encounter (HOSPITAL_COMMUNITY): Payer: Self-pay | Admitting: Emergency Medicine

## 2023-05-22 DIAGNOSIS — M1812 Unilateral primary osteoarthritis of first carpometacarpal joint, left hand: Secondary | ICD-10-CM | POA: Diagnosis not present

## 2023-05-22 DIAGNOSIS — I517 Cardiomegaly: Secondary | ICD-10-CM | POA: Insufficient documentation

## 2023-05-22 DIAGNOSIS — W19XXXA Unspecified fall, initial encounter: Secondary | ICD-10-CM | POA: Insufficient documentation

## 2023-05-22 DIAGNOSIS — E039 Hypothyroidism, unspecified: Secondary | ICD-10-CM | POA: Insufficient documentation

## 2023-05-22 DIAGNOSIS — I700 Atherosclerosis of aorta: Secondary | ICD-10-CM | POA: Diagnosis not present

## 2023-05-22 DIAGNOSIS — R410 Disorientation, unspecified: Secondary | ICD-10-CM | POA: Diagnosis not present

## 2023-05-22 DIAGNOSIS — Z79899 Other long term (current) drug therapy: Secondary | ICD-10-CM | POA: Diagnosis not present

## 2023-05-22 DIAGNOSIS — R001 Bradycardia, unspecified: Secondary | ICD-10-CM | POA: Diagnosis not present

## 2023-05-22 DIAGNOSIS — Z1152 Encounter for screening for COVID-19: Secondary | ICD-10-CM | POA: Diagnosis not present

## 2023-05-22 DIAGNOSIS — R7309 Other abnormal glucose: Secondary | ICD-10-CM | POA: Diagnosis not present

## 2023-05-22 DIAGNOSIS — R4182 Altered mental status, unspecified: Secondary | ICD-10-CM | POA: Diagnosis not present

## 2023-05-22 DIAGNOSIS — M47816 Spondylosis without myelopathy or radiculopathy, lumbar region: Secondary | ICD-10-CM | POA: Insufficient documentation

## 2023-05-22 DIAGNOSIS — R0989 Other specified symptoms and signs involving the circulatory and respiratory systems: Secondary | ICD-10-CM | POA: Diagnosis not present

## 2023-05-22 DIAGNOSIS — S0990XA Unspecified injury of head, initial encounter: Secondary | ICD-10-CM | POA: Diagnosis not present

## 2023-05-22 DIAGNOSIS — I10 Essential (primary) hypertension: Secondary | ICD-10-CM | POA: Diagnosis not present

## 2023-05-22 DIAGNOSIS — Z043 Encounter for examination and observation following other accident: Secondary | ICD-10-CM | POA: Diagnosis not present

## 2023-05-22 DIAGNOSIS — M19042 Primary osteoarthritis, left hand: Secondary | ICD-10-CM | POA: Diagnosis not present

## 2023-05-22 LAB — COMPREHENSIVE METABOLIC PANEL
ALT: 18 U/L (ref 0–44)
AST: 29 U/L (ref 15–41)
Albumin: 3.3 g/dL — ABNORMAL LOW (ref 3.5–5.0)
Alkaline Phosphatase: 36 U/L — ABNORMAL LOW (ref 38–126)
Anion gap: 11 (ref 5–15)
BUN: 17 mg/dL (ref 8–23)
CO2: 25 mmol/L (ref 22–32)
Calcium: 8.7 mg/dL — ABNORMAL LOW (ref 8.9–10.3)
Chloride: 105 mmol/L (ref 98–111)
Creatinine, Ser: 0.91 mg/dL (ref 0.44–1.00)
GFR, Estimated: >60 mL/min (ref >60)
Glucose, Bld: 165 mg/dL — ABNORMAL HIGH (ref 70–99)
Potassium: 4 mmol/L (ref 3.5–5.1)
Sodium: 141 mmol/L (ref 135–145)
Total Bilirubin: 0.5 mg/dL (ref 0.3–1.2)
Total Protein: 5.3 g/dL — ABNORMAL LOW (ref 6.5–8.1)

## 2023-05-22 LAB — LACTIC ACID, PLASMA: Lactic Acid, Venous: 1.4 mmol/L (ref 0.5–1.9)

## 2023-05-22 LAB — CBC WITH DIFFERENTIAL/PLATELET
Abs Immature Granulocytes: 0.01 10*3/uL (ref 0.00–0.07)
Basophils Absolute: 0 10*3/uL (ref 0.0–0.1)
Basophils Relative: 0 %
Eosinophils Absolute: 0.1 10*3/uL (ref 0.0–0.5)
Eosinophils Relative: 2 %
HCT: 40.3 % (ref 36.0–46.0)
Hemoglobin: 12.7 g/dL (ref 12.0–15.0)
Immature Granulocytes: 0 %
Lymphocytes Relative: 18 %
Lymphs Abs: 1.4 10*3/uL (ref 0.7–4.0)
MCH: 30.3 pg (ref 26.0–34.0)
MCHC: 31.5 g/dL (ref 30.0–36.0)
MCV: 96.2 fL (ref 80.0–100.0)
Monocytes Absolute: 0.6 10*3/uL (ref 0.1–1.0)
Monocytes Relative: 8 %
Neutro Abs: 5.5 10*3/uL (ref 1.7–7.7)
Neutrophils Relative %: 72 %
Platelets: 209 10*3/uL (ref 150–400)
RBC: 4.19 MIL/uL (ref 3.87–5.11)
RDW: 12.7 % (ref 11.5–15.5)
WBC: 7.7 10*3/uL (ref 4.0–10.5)
nRBC: 0 % (ref 0.0–0.2)

## 2023-05-22 LAB — CK: Total CK: 252 U/L — ABNORMAL HIGH (ref 38–234)

## 2023-05-22 LAB — BRAIN NATRIURETIC PEPTIDE: B Natriuretic Peptide: 80.7 pg/mL (ref 0.0–100.0)

## 2023-05-22 LAB — URINALYSIS, ROUTINE W REFLEX MICROSCOPIC
Bilirubin Urine: NEGATIVE
Glucose, UA: 50 mg/dL — AB
Hgb urine dipstick: NEGATIVE
Ketones, ur: NEGATIVE mg/dL
Leukocytes,Ua: NEGATIVE
Nitrite: NEGATIVE
Protein, ur: NEGATIVE mg/dL
Specific Gravity, Urine: 1.015 (ref 1.005–1.030)
pH: 5 (ref 5.0–8.0)

## 2023-05-22 LAB — CBG MONITORING, ED
Glucose-Capillary: 60 mg/dL — ABNORMAL LOW (ref 70–99)
Glucose-Capillary: 118 mg/dL — ABNORMAL HIGH (ref 70–99)

## 2023-05-22 LAB — TROPONIN I (HIGH SENSITIVITY): Troponin I (High Sensitivity): 4 ng/L (ref <18)

## 2023-05-22 LAB — SARS CORONAVIRUS 2 BY RT PCR: SARS Coronavirus 2 by RT PCR: NEGATIVE

## 2023-05-22 MED ORDER — LACTATED RINGERS IV BOLUS
1000.0000 mL | Freq: Once | INTRAVENOUS | Status: AC
  Administered 2023-05-22: 1000 mL via INTRAVENOUS

## 2023-05-22 MED ORDER — DEXTROSE 50 % IV SOLN
INTRAVENOUS | Status: AC
  Administered 2023-05-22: 50 mL
  Filled 2023-05-22: qty 50

## 2023-05-22 NOTE — ED Notes (Signed)
Patient's CBG found to be 60. Due to her AMS I did not feel comfortable giving her fluids PO, so I gave one amp D50.

## 2023-05-22 NOTE — ED Triage Notes (Signed)
Patient BIB [facility] c/o possible fall.  EMS reports they found patient in bed after family called EMS stating that patient had called them and told them she had fallen.  EMS reports finding patient's house in disarray like she was trying to pull herself up off the floor.  EMS reports patient is confused, GCS 14.  HR 55-65 afib, no history CBG 100 130/100 20 g LAC 500 mL LR

## 2023-05-22 NOTE — ED Provider Notes (Signed)
[place] [facility] AT [facility] Provider Note   CSN: [id number] Arrival date & time: 05/22/23  2050     History Chief Complaint  Patient presents with   Fall   Altered Mental Status    HPI EVALISE DELLY is a 77 y.o. female presenting for chief complaint of fall and altered mental status. Brought in by ambulance reported as having fallen earlier today and being unable to get up.  By time ambulance arrived she had gotten into her bed. She denies any concerns besides left wrist pain. Otherwise ambulatory tolerating p.o. intake prior to this per the patient. Patient unable to give much collateral history. Called both numbers for family no answer. Was told by EMS family is coming.  Will add further history if able to get further collateral. Patient's recorded medical, surgical, social, medication list and allergies were reviewed in the Snapshot window as part of the initial history.   Review of Systems   Review of Systems  Constitutional:  Negative for chills and fever.  HENT:  Negative for ear pain and sore throat.   Eyes:  Negative for pain and visual disturbance.  Respiratory:  Negative for cough and shortness of breath.   Cardiovascular:  Negative for chest pain and palpitations.  Gastrointestinal:  Negative for abdominal pain and vomiting.  Genitourinary:  Negative for dysuria and hematuria.  Musculoskeletal:  Negative for arthralgias and back pain.  Skin:  Negative for color change and rash.  Neurological:  Negative for seizures and syncope.  All other systems reviewed and are negative.   Physical Exam Updated Vital Signs BP 128/68   Pulse (!) 55   Temp (!) 96.1 F (35.6 C) (Rectal)   Resp (!) 25   SpO2 98%  Physical Exam Vitals and nursing note reviewed.  Constitutional:      General: She is not in acute distress.    Appearance: She is well-developed.  HENT:     Head: Normocephalic and atraumatic.  Eyes:     Conjunctiva/sclera:  Conjunctivae normal.  Cardiovascular:     Rate and Rhythm: Normal rate and regular rhythm.     Heart sounds: No murmur heard. Pulmonary:     Effort: Pulmonary effort is normal. No respiratory distress.     Breath sounds: Normal breath sounds.  Abdominal:     General: There is no distension.     Palpations: Abdomen is soft.     Tenderness: There is no abdominal tenderness. There is no right CVA tenderness or left CVA tenderness.  Musculoskeletal:        General: No swelling or tenderness. Normal range of motion.     Cervical back: Neck supple.  Skin:    General: Skin is warm and dry.  Neurological:     General: No focal deficit present.     Mental Status: She is alert and oriented to person, place, and time. Mental status is at baseline.     Cranial Nerves: No cranial nerve deficit.      ED Course/ Medical Decision Making/ A&P    Procedures Procedures   Medications Ordered in ED Medications  dextrose 50 % solution (50 mLs  Given 05/22/23 2105)  lactated ringers bolus 1,000 mL (1,000 mLs Intravenous New Bag/Given 05/22/23 2232)   Medical Decision Making:   CADENCE LAMAY is a 77 y.o. female who presented to the ED today with altered mental status detailed above.    Additional history discussed with patient's family/caregivers.  Patient placed  on continuous vitals and telemetry monitoring while in ED which was reviewed periodically.  Complete initial physical exam performed, notably the patient  was AMS.    Reviewed and confirmed nursing documentation for past medical history, family history, social history.    Initial Assessment:   With the patient's presentation of altered mental status, most likely diagnosis is delerium 2/2 infectious etiology (UTI/CAP/URI) vs metabolic abnormality (Na/K/Mg/Ca) vs nonspecific etiology. Other diagnoses were considered including (but not limited to) CVA, ICH, intracranial mass, critical dehydration, heptatic dysfunction, uremia, hypercarbia,  intoxication, endrocrine abnormality, toxidrome. These are considered less likely due to history of present illness and physical exam findings.   This is most consistent with an acute life/limb threatening illness complicated by underlying chronic conditions.  Initial Plan:  CTH to evaluate for intracranial etiology of patient's symptoms  Screening labs including CBC and Metabolic panel to evaluate for infectious or metabolic etiology of disease.  Urinalysis with reflex culture ordered to evaluate for UTI or relevant urologic/nephrologic pathology.  CXR to evaluate for structural/infectious intrathoracic pathology.  Objective evaluation as below reviewed   Initial Study Results:   Laboratory  All laboratory results reviewed without evidence of clinically relevant pathology.     EKG EKG was reviewed independently. Rate, rhythm, axis, intervals all examined and without medically relevant abnormality. ST segments without concerns for elevations.    Radiology:  All images reviewed independently. Agree with radiology report at this time.   CT HEAD WO CONTRAST ( )  Result Date: 05/22/2023 CLINICAL DATA:  Head trauma, minor (Age >= 65y) EXAM: CT HEAD WITHOUT CONTRAST TECHNIQUE: Contiguous axial images were obtained from the base of the skull through the vertex without intravenous contrast. RADIATION DOSE REDUCTION: This exam was performed according to the departmental dose-optimization program which includes automated exposure control, adjustment of the mA and/or kV according to patient size and/or use of iterative reconstruction technique. COMPARISON:  11/10/2022 FINDINGS: Brain: No acute intracranial abnormality. Specifically, no hemorrhage, hydrocephalus, mass lesion, acute infarction, or significant intracranial injury. Vascular: No hyperdense vessel or unexpected calcification. Skull: No acute calvarial abnormality. Sinuses/Orbits: No acute findings Other: None IMPRESSION: No acute intracranial  abnormality. Electronically Signed   By: Charlett Nose M.D.   On: 05/22/2023 22:27   DG Pelvis Portable  Result Date: 05/22/2023 CLINICAL DATA:  Fall EXAM: PORTABLE PELVIS 1-2 VIEWS COMPARISON:  None Available. FINDINGS: Patient is slightly rotated. There is no evidence of pelvic fracture or diastasis. No pelvic bone lesions are seen. There are degenerative changes of the lumbar spine. IMPRESSION: No evidence of fracture. Degenerative changes of the lumbar spine. Electronically Signed   By: Darliss Cheney M.D.   On: 05/22/2023 21:15   DG Chest Portable 1 View  Result Date: 05/22/2023 CLINICAL DATA:  Fall.  Confusion. EXAM: PORTABLE CHEST 1 VIEW COMPARISON:  11/10/2022 FINDINGS: Stable cardiomegaly. Aortic atherosclerotic calcification. Pulmonary venous congestion. No focal consolidation, pleural effusion, or pneumothorax. No displaced rib fractures. IMPRESSION: Cardiomegaly and pulmonary venous congestion. Electronically Signed   By: Minerva Fester M.D.   On: 05/22/2023 21:15   DG Wrist Complete Left  Result Date: 05/22/2023 CLINICAL DATA:  Fall EXAM: LEFT WRIST - COMPLETE 3+ VIEW COMPARISON:  None Available. FINDINGS: There is no definite acute fracture. Scapholunate alignment appears abnormal on the AP views. There is radiocarpal joint space narrowing compatible with degenerative change. There is soft tissue swelling surrounding the wrist. There are degenerative changes of the first carpometacarpal joint and first metacarpophalangeal joint. IMPRESSION: 1. No definite acute fracture.  2. Scapholunate alignment appears abnormal on the AP views, raising the possibility of scapholunate ligament injury. Consider dedicated scaphoid view for further evaluation. 3. Soft tissue swelling surrounding the wrist. Electronically Signed   By: Darliss Cheney M.D.   On: 05/22/2023 21:13    Final Assessment and Plan:   Finally received a call back from patient's daughter. They state that she lives at home alone and has  episodes like this occasionally but it resolves and returns to her baseline.  We discussed her risk factors.  Infection is still considered possible but her workup has been negative, intracranial hemorrhage is been ruled out by CT she does not have any traumatic injuries nor pneumonia identified.  She is continuing to improve.  She was hypoglycemic on arrival but states that she had not had time to have dinner yet. Appeared slightly dehydrated with an elevated CK treated with IV fluids. Had a shared medical decision making with patient's daughter Hospital doctor.  Discussed keeping patient in observation and placement versus discharge this evening.  Amber stated that they are sending a cousin Ms. Melody Delford Field to see the patient in the emergency room.  They are hopeful that Melody will be able to take the patient home this evening.  Given negative studies and patient's return to baseline I think this would be reasonable.  Melodee will see the patient here in the emergency room to make sure they are in agreement. Patient's most likely etiology of her altered mental status is polypharmacy given her comorbid use of Seroquel and Xanax given her age. I recommended she stop these medications if she is to continue living at home unsupervised.  I pulled the patient aside and private to discuss her situation.  We discussed how she is living in a very tenuous living situation and is at high risk for a fall with severe injury.  Discussed that she would likely be safer if placed in a living facility but patient adamantly denied this service. States that she would rather be injured then go to a facility.  States that she is working on selling her house and moving in with a family member which would be a more tenable living situation. For tonight, there is no acute indication for further intervention.  Anticipate that patient's family member will assess her at bedside and if at her mental status baseline she will be discharged.  If  off her baseline, she may need admission for polypharmacy and delirium.  Reassessment: Family has arrived and stated that she is at her baseline.  We discussed her risk for polypharmacy is likely etiology of her delirium that is now resolved. They stated they will work on taking her Seroquel down.  Notably on initial presentation she was hypothermic and bradycardic she does have a history of hypothyroidism so myxedema is considered, but her mental status resolved spontaneously and her last TSH was normal after initiation of a higher dose treatment.  Those findings are inconsistent with myxedema coma. If patient is to have interval worsening then thyroid disease should be considered as the primary diagnosis until proven otherwise. Family feels she is at her baseline and would like to take her home immediately so repeating thyroid studies at this time would be superfluous when they are planning to follow-up with her primary care provider in the synchronicity.  Disposition:  I have considered need for hospitalization, however, considering all of the above, I believe this patient is stable for discharge at this time.  Patient/family educated about specific  return precautions for given chief complaint and symptoms.  Patient/family educated about follow-up with PCP.     Patient/family expressed understanding of return precautions and need for follow-up. Patient spoken to regarding all imaging and laboratory results and appropriate follow up for these results. All education provided in verbal form with additional information in written form. Time was allowed for answering of patient questions. Patient discharged.    Emergency Department Medication Summary:   Medications  dextrose 50 % solution (50 mLs  Given 05/22/23 2105)  lactated ringers bolus 1,000 mL (1,000 mLs Intravenous New Bag/Given 05/22/23 2232)    Clinical Impression:  1. Altered mental status, unspecified altered mental status type       Data Unavailable   Final Clinical Impression(s) / ED Diagnoses Final diagnoses:  Altered mental status, unspecified altered mental status type    Rx / DC Orders ED Discharge Orders     None         Glyn Ade, MD 05/22/23 2307

## 2023-05-25 ENCOUNTER — Telehealth: Payer: Self-pay | Admitting: Internal Medicine

## 2023-05-25 MED ORDER — TRAZODONE HCL 50 MG PO TABS: 50.0000 mg | ORAL_TABLET | Freq: Every day | ORAL | 5 refills | Status: DC

## 2023-05-25 NOTE — Telephone Encounter (Signed)
I agree with stopping Seroquel.  Okay to discontinue.  We can try trazodone 50 mg nightly.  She is already on antidepressant that increases serotonin and on a rare occasion you can have too much serotonin and have side effects so we need to monitor for this-the symptoms would include  Agitation or restlessness. Abnormal eye movements. Diarrhea. Fast heartbeat and high blood pressure. Hallucinations. Increased body temperature. Loss of coordination. Nausea and vomiting.   She has a follow-up appointment with me in October.  This can be moved up if needed.  Trazodone sent to pharmacy.

## 2023-05-25 NOTE — Telephone Encounter (Signed)
Patient fell on 05/22/2023 and went to the hospital - She thinks that the seroquel is too much for her.  She wants to know if she can go back on the trazadone.  Patient doesn't want to take the serequel.  Please call her niece Jackelyn Poling  - [phone]  Pharmacy:  [facility]

## 2023-05-26 NOTE — Telephone Encounter (Signed)
Left message for return phone call to her daughter Joice Lofts to return phone call to clinic.  If she calls back please transfer to me.  Niece is not listed on HIPAA so do not release any info to her.   Thanks

## 2023-05-27 ENCOUNTER — Telehealth: Payer: Self-pay

## 2023-05-27 LAB — CULTURE, BLOOD (ROUTINE X 2)
Culture: NO GROWTH
Culture: NO GROWTH
Special Requests: ADEQUATE

## 2023-05-27 NOTE — Telephone Encounter (Signed)
Transition Care Management Unsuccessful Follow-up Telephone Call  Date of discharge and from where:  05/21/2023 [facility]  Attempts:  1st Attempt  Reason for unsuccessful TCM follow-up call:  Left voice message  Garwood Wentzell [facility]  [facility] Population Health Community Resource Care Guide   [email or web address]  ?? [phone]   Website: [email or web address]  [email or web address]

## 2023-05-30 ENCOUNTER — Telehealth: Payer: Self-pay

## 2023-05-30 NOTE — Telephone Encounter (Signed)
Transition Care Management Unsuccessful Follow-up Telephone Call  Date of discharge and from where:  05/22/2023 [facility]  Attempts:  2nd Attempt  Reason for unsuccessful TCM follow-up call:  Left voice message  Asencion Loveday [facility]  [facility] Population Health Community Resource Care Guide   [email or web address]  ?? [phone]   Website: [email or web address]  [email or web address]

## 2023-06-05 ENCOUNTER — Other Ambulatory Visit: Payer: Self-pay | Admitting: Internal Medicine

## 2023-06-09 ENCOUNTER — Other Ambulatory Visit: Payer: Self-pay | Admitting: Internal Medicine

## 2023-06-09 DIAGNOSIS — F419 Anxiety disorder, unspecified: Secondary | ICD-10-CM

## 2023-06-16 ENCOUNTER — Other Ambulatory Visit: Payer: Self-pay | Admitting: Internal Medicine

## 2023-06-16 DIAGNOSIS — F419 Anxiety disorder, unspecified: Secondary | ICD-10-CM

## 2023-07-04 ENCOUNTER — Telehealth: Payer: Self-pay | Admitting: Internal Medicine

## 2023-07-04 ENCOUNTER — Other Ambulatory Visit: Payer: Self-pay

## 2023-07-04 MED ORDER — OMEPRAZOLE 40 MG PO CPDR: DELAYED_RELEASE_CAPSULE | ORAL | 2 refills | Status: DC

## 2023-07-04 NOTE — Telephone Encounter (Signed)
Prescription Request  07/04/2023  LOV: 01/19/2023  What is the name of the medication or equipment? omeprazole (PRILOSEC) 40 MG capsule   Have you contacted your pharmacy to request a refill? No   Which pharmacy would you like this sent to?  [facility] [address] [place], [place] - [address] [place] [address] Phone: [phone] Fax: [phone]  Patient notified that their request is being sent to the clinical staff for review and that they should receive a response within 2 business days.   Please advise at Mobile [phone] (mobile)

## 2023-07-04 NOTE — Telephone Encounter (Signed)
Faxed in today. 

## 2023-07-12 ENCOUNTER — Other Ambulatory Visit: Payer: Self-pay | Admitting: Internal Medicine

## 2023-07-18 ENCOUNTER — Inpatient Hospital Stay: Admission: RE | Admit: 2023-07-18 | Payer: Medicare HMO | Source: Ambulatory Visit

## 2023-07-24 ENCOUNTER — Encounter: Payer: Self-pay | Admitting: Internal Medicine

## 2023-07-24 NOTE — Progress Notes (Unsigned)
Subjective:    Patient ID: Dawn Kim, female    DOB: 08-05-1946, 77 y.o.   MRN: [id number]     HPI Dawn Kim is here for follow up of her chronic medical problems.   Trazodone is working for 4 hrs.  Goes to bed at 8:30-9 pm.  May nap x 1 hour during the day but not more.  Drinks one cup of coffee in the morning only.    Still living on her own.    Memory- she is here alone - she denies any concerns.     Medications and allergies reviewed with patient and updated if appropriate.  Current Outpatient Medications on File Prior to Visit  Medication Sig Dispense Refill   acetaminophen (TYLENOL) 325 MG tablet Take 650 mg by mouth every 6 (six) hours as needed for mild pain or moderate pain.      ALPRAZolam (XANAX) 0.5 MG tablet TAKE 1 TABLET BY MOUTH THREE TIMES DAILY AS NEEDED FOR ANXIETY . DO NOT EXCEED 3 PER 24 HOURS 90 tablet 0   calcium carbonate (OS-CAL - DOSED IN MG OF ELEMENTAL CALCIUM) 1250 (500 Ca) MG tablet Take 1 tablet (500 mg of elemental calcium total) by mouth daily.     cholecalciferol (VITAMIN D) 1000 UNITS tablet Take 5,000 Units by mouth daily.     famotidine (PEPCID) 40 MG tablet TAKE 1 TABLET BY MOUTH AT BEDTIME 90 tablet 0   ferrous sulfate 325 (65 FE) MG EC tablet Take 325 mg by mouth daily with breakfast.     fish oil-omega-3 fatty acids 1000 MG capsule Take 1 g by mouth 2 (two) times daily.      furosemide (LASIX) 20 MG tablet Take 1 tablet by mouth once daily 90 tablet 0   levocetirizine (XYZAL) 5 MG tablet TAKE 1 TABLET BY MOUTH ONCE DAILY IN THE EVENING 90 tablet 0   levothyroxine (SYNTHROID) 125 MCG tablet Take 1 tablet (125 mcg total) by mouth daily. For thyroid 90 tablet 3   Multiple Vitamin (MULTIVITAMIN) tablet Take 1 tablet by mouth daily.     omeprazole (PRILOSEC) 40 MG capsule TAKE 1 CAPSULE BY MOUTH TWICE DAILY BEFORE A MEAL 180 capsule 2   potassium chloride (KLOR-CON M) 10 MEQ tablet Take 2 tablets (20 mEq total) by mouth daily. 180 tablet  1   simvastatin (ZOCOR) 40 MG tablet Take 1 tablet by mouth in the evening 90 tablet 0   traZODone (DESYREL) 50 MG tablet Take 1 tablet (50 mg total) by mouth at bedtime. 30 tablet 5   valsartan (DIOVAN) 80 MG tablet Take 1 tablet (80 mg total) by mouth daily. For blood pressure 90 tablet 3   venlafaxine XR (EFFEXOR-XR) 150 MG 24 hr capsule TAKE 1 CAPSULE BY MOUTH ONCE DAILY WITH BREAKFAST 90 capsule 0   venlafaxine XR (EFFEXOR-XR) 75 MG 24 hr capsule TAKE 1 CAPSULE BY MOUTH ONCE DAILY WITH BREAKFAST 90 capsule 0   No current facility-administered medications on file prior to visit.     Review of Systems  Constitutional:  Negative for fever.  Respiratory:  Negative for cough, shortness of breath and wheezing.   Cardiovascular:  Negative for chest pain, palpitations and leg swelling.  Neurological:  Negative for light-headedness and headaches.       Objective:   Vitals:   07/25/23 1413  BP: 138/86  Pulse: 84  Temp: 98 F (36.7 C)  SpO2: 99%   BP Readings from Last 3 Encounters:  07/25/23 138/86  05/22/23 (!) 122/59  01/19/23 (!) 144/76   Wt Readings from Last 3 Encounters:  03/02/22 179 lb (81.2 kg)  09/01/21 178 lb (80.7 kg)  03/30/21 186 lb 6.4 oz (84.6 kg)   Body mass index is 31.71 kg/m.    Physical Exam Constitutional:      General: She is not in acute distress.    Appearance: Normal appearance.  HENT:     Head: Normocephalic and atraumatic.  Eyes:     Conjunctiva/sclera: Conjunctivae normal.  Cardiovascular:     Rate and Rhythm: Normal rate and regular rhythm.     Heart sounds: Normal heart sounds.  Pulmonary:     Effort: Pulmonary effort is normal. No respiratory distress.     Breath sounds: Normal breath sounds. No wheezing.  Musculoskeletal:     Cervical back: Neck supple.     Right lower leg: No edema.     Left lower leg: No edema.  Lymphadenopathy:     Cervical: No cervical adenopathy.  Skin:    General: Skin is warm and dry.     Findings: No  rash.  Neurological:     Mental Status: She is alert. Mental status is at baseline.  Psychiatric:        Mood and Affect: Mood normal.        Behavior: Behavior normal.        Lab Results  Component Value Date   WBC 7.7 05/22/2023   HGB 12.7 05/22/2023   HCT 40.3 05/22/2023   PLT 209 05/22/2023   GLUCOSE 165 (H) 05/22/2023   CHOL 141 01/17/2023   TRIG 130.0 01/17/2023   HDL 51.10 01/17/2023   LDLDIRECT 86.0 09/01/2021   LDLCALC 64 01/17/2023   ALT 18 05/22/2023   AST 29 05/22/2023   NA 141 05/22/2023   K 4.0 05/22/2023   CL 105 05/22/2023   CREATININE 0.91 05/22/2023   BUN 17 05/22/2023   CO2 25 05/22/2023   TSH 0.66 01/17/2023   INR 2.33 (H) 12/02/2009   HGBA1C 5.2 01/17/2023     Assessment & Plan:    See Problem List for Assessment and Plan of chronic medical problems.

## 2023-07-24 NOTE — Patient Instructions (Addendum)
      Blood work was ordered.   The lab is on the first floor.    Medications changes include :   Stop cilostazol.   Increase trazodone to 100 mg nightly     Return in about 6 months (around 01/23/2024) for Physical Exam.

## 2023-07-25 ENCOUNTER — Ambulatory Visit: Payer: Medicare HMO | Admitting: Internal Medicine

## 2023-07-25 VITALS — BP 138/86 | HR 84 | Temp 98.0°F | Ht 63.0 in

## 2023-07-25 DIAGNOSIS — R600 Localized edema: Secondary | ICD-10-CM

## 2023-07-25 DIAGNOSIS — G4709 Other insomnia: Secondary | ICD-10-CM

## 2023-07-25 DIAGNOSIS — F32A Depression, unspecified: Secondary | ICD-10-CM

## 2023-07-25 DIAGNOSIS — I10 Essential (primary) hypertension: Secondary | ICD-10-CM

## 2023-07-25 DIAGNOSIS — E039 Hypothyroidism, unspecified: Secondary | ICD-10-CM

## 2023-07-25 DIAGNOSIS — R413 Other amnesia: Secondary | ICD-10-CM

## 2023-07-25 DIAGNOSIS — K219 Gastro-esophageal reflux disease without esophagitis: Secondary | ICD-10-CM

## 2023-07-25 DIAGNOSIS — E7800 Pure hypercholesterolemia, unspecified: Secondary | ICD-10-CM

## 2023-07-25 DIAGNOSIS — N1831 Chronic kidney disease, stage 3a: Secondary | ICD-10-CM | POA: Diagnosis not present

## 2023-07-25 DIAGNOSIS — F419 Anxiety disorder, unspecified: Secondary | ICD-10-CM | POA: Diagnosis not present

## 2023-07-25 LAB — LIPID PANEL
Cholesterol: 199 mg/dL (ref 0–200)
HDL: 56.7 mg/dL (ref >39.00)
LDL Cholesterol: 116 mg/dL — ABNORMAL HIGH (ref 0–99)
NonHDL: 142.75
Total CHOL/HDL Ratio: 4
Triglycerides: 133 mg/dL (ref 0.0–149.0)
VLDL: 26.6 mg/dL (ref 0.0–40.0)

## 2023-07-25 LAB — COMPREHENSIVE METABOLIC PANEL
ALT: 18 U/L (ref 0–35)
AST: 20 U/L (ref 0–37)
Albumin: 4.3 g/dL (ref 3.5–5.2)
Alkaline Phosphatase: 45 U/L (ref 39–117)
BUN: 19 mg/dL (ref 6–23)
CO2: 34 meq/L — ABNORMAL HIGH (ref 19–32)
Calcium: 9.4 mg/dL (ref 8.4–10.5)
Chloride: 101 meq/L (ref 96–112)
Creatinine, Ser: 0.94 mg/dL (ref 0.40–1.20)
GFR: 57.85 mL/min — ABNORMAL LOW (ref >60.00)
Glucose, Bld: 95 mg/dL (ref 70–99)
Potassium: 4.2 meq/L (ref 3.5–5.1)
Sodium: 144 meq/L (ref 135–145)
Total Bilirubin: 0.6 mg/dL (ref 0.2–1.2)
Total Protein: 6.7 g/dL (ref 6.0–8.3)

## 2023-07-25 LAB — TSH: TSH: 0.6 u[IU]/mL (ref 0.35–5.50)

## 2023-07-25 MED ORDER — VENLAFAXINE HCL ER 75 MG PO CP24: ORAL_CAPSULE | ORAL | Status: DC

## 2023-07-25 MED ORDER — TRAZODONE HCL 100 MG PO TABS: 100.0000 mg | ORAL_TABLET | Freq: Every day | ORAL | 1 refills | Status: DC

## 2023-07-25 NOTE — Assessment & Plan Note (Addendum)
Chronic Controlled  Currently taking Effexor 75 mg daily and alprazolam 0.5 mg 3 times daily

## 2023-07-25 NOTE — Assessment & Plan Note (Addendum)
She denies memory concerns Will monitor

## 2023-07-25 NOTE — Assessment & Plan Note (Signed)
Chronic  elevate legs as much as possible when sitting She is sedentary  - advised walking as much as possible  Low sodium diet Continue lasix 20 mg daily Start wearing compression socks

## 2023-07-25 NOTE — Assessment & Plan Note (Addendum)
Chronic Several medication have not been effective Increase trazodone to 100 mg nightly Encouraged regular exercise Stress no caffeine except for first thing in the morning and to keep it to a minimum Advise not napping during the day

## 2023-07-25 NOTE — Assessment & Plan Note (Signed)
Chronic BP slightly elevated - ok for age Continue diovan 80 mg daily Low sodium diet Walk as much as possible

## 2023-07-25 NOTE — Assessment & Plan Note (Signed)
Chronic Reasonably controlled Continue Effexor 225 mg daily, alprazolam 0.5 mg 3 times daily

## 2023-07-25 NOTE — Assessment & Plan Note (Signed)
Chronic °Check lipid panel  °Continue simvastatin 40 mg daily °

## 2023-07-25 NOTE — Assessment & Plan Note (Signed)
Chronic CMP, CBC 

## 2023-07-25 NOTE — Assessment & Plan Note (Signed)
Chronic GERD controlled Continue famotidine 40 mg daily, omeprazole 40 mg bid  

## 2023-07-25 NOTE — Assessment & Plan Note (Signed)
Chronic  Clinically euthyroid Check tsh and will titrate med dose if needed Currently taking levothyroxine 125 mcg daily  

## 2023-07-27 ENCOUNTER — Telehealth: Payer: Self-pay | Admitting: Internal Medicine

## 2023-07-27 NOTE — Telephone Encounter (Signed)
Patient returned Carla's call about her lab results. She would like a call back at [phone].

## 2023-07-28 NOTE — Telephone Encounter (Signed)
Message left for patient to return call to clinic.  If she or Hospital doctor (niece) call back please provide info below:  Cholesterol is okay.  Kidney function is minimally decreased and we will monitor this.  Liver tests are normal.  Thyroid functions in the normal range.

## 2023-08-04 DIAGNOSIS — H52223 Regular astigmatism, bilateral: Secondary | ICD-10-CM | POA: Diagnosis not present

## 2023-08-04 DIAGNOSIS — Z135 Encounter for screening for eye and ear disorders: Secondary | ICD-10-CM | POA: Diagnosis not present

## 2023-08-04 DIAGNOSIS — H5203 Hypermetropia, bilateral: Secondary | ICD-10-CM | POA: Diagnosis not present

## 2023-08-04 DIAGNOSIS — H524 Presbyopia: Secondary | ICD-10-CM | POA: Diagnosis not present

## 2023-08-04 DIAGNOSIS — H04123 Dry eye syndrome of bilateral lacrimal glands: Secondary | ICD-10-CM | POA: Diagnosis not present

## 2023-08-04 DIAGNOSIS — Z961 Presence of intraocular lens: Secondary | ICD-10-CM | POA: Diagnosis not present

## 2023-08-05 ENCOUNTER — Other Ambulatory Visit: Payer: Self-pay | Admitting: Internal Medicine

## 2023-08-05 DIAGNOSIS — F419 Anxiety disorder, unspecified: Secondary | ICD-10-CM

## 2023-08-29 DIAGNOSIS — H524 Presbyopia: Secondary | ICD-10-CM | POA: Diagnosis not present

## 2023-09-02 ENCOUNTER — Other Ambulatory Visit: Payer: Self-pay | Admitting: Internal Medicine

## 2023-09-02 DIAGNOSIS — F419 Anxiety disorder, unspecified: Secondary | ICD-10-CM

## 2023-10-02 ENCOUNTER — Other Ambulatory Visit: Payer: Self-pay | Admitting: Internal Medicine

## 2023-10-02 DIAGNOSIS — F419 Anxiety disorder, unspecified: Secondary | ICD-10-CM

## 2023-10-08 ENCOUNTER — Other Ambulatory Visit: Payer: Self-pay | Admitting: Internal Medicine

## 2023-10-14 ENCOUNTER — Other Ambulatory Visit: Payer: Self-pay | Admitting: Internal Medicine

## 2023-10-23 ENCOUNTER — Other Ambulatory Visit: Payer: Self-pay | Admitting: Internal Medicine

## 2023-10-28 ENCOUNTER — Other Ambulatory Visit: Payer: Self-pay | Admitting: Internal Medicine

## 2023-10-31 NOTE — Telephone Encounter (Signed)
Copied from CRM [id number]. Topic: Clinical - Prescription Issue >> Oct 31, 2023  3:49 PM Eunice Blase wrote: Reason for CRM: Pt called would like doctor to increase dosage of ALPRAZolam (XANAX) 0.5 MG tablet. Please call pt at [phone].

## 2023-11-01 ENCOUNTER — Telehealth: Payer: Self-pay | Admitting: Internal Medicine

## 2023-11-01 NOTE — Telephone Encounter (Signed)
I will not increase her alprazolam.

## 2023-11-01 NOTE — Telephone Encounter (Unsigned)
Copied from CRM [id number]. Topic: Clinical - Prescription Issue >> Nov 01, 2023  3:17 PM Florestine Avers wrote: Reason for CRMPatient wanted to get a message to Dr. Lawerance Bach, she wanted to know if she would increase her Lorazepam. Patient is requesting a call back as soon as possible. Patient wants to know if she is going to increase it before its time for the next refill.

## 2023-11-02 ENCOUNTER — Other Ambulatory Visit: Payer: Self-pay | Admitting: Internal Medicine

## 2023-11-02 DIAGNOSIS — F419 Anxiety disorder, unspecified: Secondary | ICD-10-CM

## 2023-11-02 MED ORDER — ALPRAZOLAM 0.5 MG PO TABS: ORAL_TABLET | ORAL | 0 refills | Status: DC

## 2023-11-02 NOTE — Telephone Encounter (Unsigned)
Copied from CRM [id number]. Topic: Clinical - Medication Refill >> Nov 02, 2023  4:05 PM Dennison Nancy wrote: Most Recent Primary Care Visit:  Provider: Pincus Sanes  Department: [facility]  Visit Type: OFFICE VISIT  Date: 07/25/2023  Medication: ALPRAZolam Prudy Feeler) 0.5 MG tablet  Has the patient contacted their pharmacy? No pharmacy reach out to the patient that medication was deny   (Agent: If no, request that the patient contact the pharmacy for the refill. If patient does not wish to contact the pharmacy document the reason why and proceed with request.) (Agent: If yes, when and what did the pharmacy advise?)  Is this the correct pharmacy for this prescription? Yes If no, delete pharmacy and type the correct one.  This is the patient's preferred pharmacy:  [facility] [address], [place] - [address] [place] [address] Phone: [phone] Fax: [phone]   Has the prescription been filled recently? Yes  Is the patient out of the medication? No  Has the patient been seen for an appointment in the last year OR does the patient have an upcoming appointment? Yes  Can we respond through MyChart? No , call the patient   Agent: Please be advised that Rx refills may take up to 3 business days. We ask that you follow-up with your pharmacy.

## 2023-11-02 NOTE — Telephone Encounter (Signed)
Attempted to reach patient quite a few times but number given in chart is a non working number.

## 2023-11-02 NOTE — Telephone Encounter (Signed)
If she calls back please let her know that her alprazolam will not be increased.

## 2023-11-06 ENCOUNTER — Other Ambulatory Visit: Payer: Self-pay | Admitting: Internal Medicine

## 2023-11-23 ENCOUNTER — Other Ambulatory Visit: Payer: Self-pay | Admitting: Internal Medicine

## 2023-11-23 DIAGNOSIS — F419 Anxiety disorder, unspecified: Secondary | ICD-10-CM

## 2023-12-03 ENCOUNTER — Other Ambulatory Visit: Payer: Self-pay | Admitting: Internal Medicine

## 2023-12-08 ENCOUNTER — Ambulatory Visit: Payer: Self-pay | Admitting: Internal Medicine

## 2023-12-08 NOTE — Telephone Encounter (Signed)
  Chief Complaint: Pain in both feet, both legs, both shoulders, and lower back Symptoms: Pain Frequency: 1-2 weeks it has been worse but she states it all has been going on for a long time Pertinent Negatives: Patient denies chest pain, swelling, rashes, fevers, difficulty breathing,  Disposition: [] ED /[] Urgent Care (no appt availability in office) / [] Appointment(In office/virtual)/ []  [place] [facility]/ [] Home Care/ [] Refused Recommended Disposition /[] [place] [facility]/ []  Follow-up with PCP Additional Notes: Patient called and advised that she has had "a great many falls" and she has had a lot of pain in both legs and both feet.  She states both shoulders and lower back also hurt. Patient denies any recent falls but states her last fall was about six months ago.  She states that she has had problems ever since.  Patient states that she takes Tylenol but that doesn't help. Patient states that she can walk and she would like for her PCP to possibly send her in some medication for her pain.  She is advised that with her pain being so severe, it is recommended that she is seen by a provider in the next 4 hours.  She states she doesn't want to go out anywhere, she is hoping that her PCP can just get her some medication to help her pain.  Patient is also advised by this RN that if she gets in too much pain and needs medical help not to hesitate to call 911 for assistance.  Patient states that she will call them if she needs to she doesn't mind.   Reason for Disposition  [1] SEVERE pain (e.g., excruciating, unable to do any normal activities) AND [2] not improved after 2 hours of pain medicine  Answer Assessment - Initial Assessment Questions 1. ONSET: "When did the pain start?"      "Quite a long time" 2. LOCATION: "Where is the pain located?"      Both legs and both feet 3. PAIN: "How bad is the pain?"    (Scale 1-10; or mild, moderate, severe)   -  MILD (1-3): doesn't interfere with  normal activities    -  MODERATE (4-7): interferes with normal activities (e.g., work or school) or awakens from sleep, limping    -  SEVERE (8-10): excruciating pain, unable to do any normal activities, unable to walk     8-9 4. WORK OR EXERCISE: "Has there been any recent work or exercise that involved this part of the body?"      Daily use 5. CAUSE: "What do you think is causing the leg pain?"     Falls in the past  (over 6 months ago) 6. OTHER SYMPTOMS: "Do you have any other symptoms?" (e.g., chest pain, back pain, breathing difficulty, swelling, rash, fever, numbness, weakness)     Back pain and pain in both shoulders  Protocols used: Leg Pain-A-AH

## 2023-12-08 NOTE — Telephone Encounter (Signed)
 RN attempted 3 calls to reach pt. No VM could be left d/t lack of VM.

## 2023-12-09 ENCOUNTER — Ambulatory Visit: Payer: Self-pay | Admitting: Internal Medicine

## 2023-12-09 NOTE — Telephone Encounter (Signed)
  Chief Complaint: Duplicate contact  Disposition: [] ED /[] Urgent Care (no appt availability in office) / [] Appointment(In office/virtual)/ []  [place] [facility]/ [] Home Care/ [] Refused Recommended Disposition /[] [facility]/ [x]  Follow-up with PCP Additional Notes: Patient calling to follow up on triage from yesterday, states she has not received a call back and would like to speak to someone in the office. Warm tx to clinic, Denny Peon, to call to speak with patient.   Copied from CRM [id number]. Topic: Clinical - Red Word Triage >> Dec 09, 2023  1:51 PM Dawn Kim wrote: [facility] that prompted transfer to Nurse Triage: Patient called in stating that she has pain in her legs feet and shoulders Reason for Disposition  Caller has already spoken with another triager or PCP AND has further questions AND triager able to answer questions.  Answer Assessment - Initial Assessment Questions N/A Patient called requesting to speak to clinic regarding yesterday's triage. States she has not had a callback yet.  Protocols used: No Contact or Duplicate Contact Call-A-AH

## 2023-12-13 NOTE — Telephone Encounter (Signed)
 Attempted to reach patient today.  She will need an office visit before any medication can be given for pain control.

## 2023-12-26 ENCOUNTER — Other Ambulatory Visit: Payer: Self-pay | Admitting: Internal Medicine

## 2023-12-26 DIAGNOSIS — F419 Anxiety disorder, unspecified: Secondary | ICD-10-CM

## 2024-01-01 ENCOUNTER — Emergency Department (HOSPITAL_COMMUNITY)

## 2024-01-01 ENCOUNTER — Emergency Department (HOSPITAL_COMMUNITY)
Admission: EM | Admit: 2024-01-01 | Discharge: 2024-01-01 | Disposition: A | Discharge status: Home / Self Care | Attending: Emergency Medicine | Admitting: Emergency Medicine

## 2024-01-01 DIAGNOSIS — E039 Hypothyroidism, unspecified: Secondary | ICD-10-CM | POA: Insufficient documentation

## 2024-01-01 DIAGNOSIS — N183 Chronic kidney disease, stage 3 unspecified: Secondary | ICD-10-CM | POA: Diagnosis not present

## 2024-01-01 DIAGNOSIS — Z743 Need for continuous supervision: Secondary | ICD-10-CM | POA: Diagnosis not present

## 2024-01-01 DIAGNOSIS — W19XXXA Unspecified fall, initial encounter: Secondary | ICD-10-CM

## 2024-01-01 DIAGNOSIS — M545 Low back pain, unspecified: Secondary | ICD-10-CM | POA: Diagnosis not present

## 2024-01-01 DIAGNOSIS — M5459 Other low back pain: Secondary | ICD-10-CM | POA: Diagnosis not present

## 2024-01-01 DIAGNOSIS — M5135 Other intervertebral disc degeneration, thoracolumbar region: Secondary | ICD-10-CM | POA: Diagnosis not present

## 2024-01-01 DIAGNOSIS — I129 Hypertensive chronic kidney disease with stage 1 through stage 4 chronic kidney disease, or unspecified chronic kidney disease: Secondary | ICD-10-CM | POA: Insufficient documentation

## 2024-01-01 DIAGNOSIS — M5126 Other intervertebral disc displacement, lumbar region: Secondary | ICD-10-CM | POA: Diagnosis not present

## 2024-01-01 DIAGNOSIS — Z471 Aftercare following joint replacement surgery: Secondary | ICD-10-CM | POA: Diagnosis not present

## 2024-01-01 DIAGNOSIS — I959 Hypotension, unspecified: Secondary | ICD-10-CM | POA: Diagnosis not present

## 2024-01-01 DIAGNOSIS — G8929 Other chronic pain: Secondary | ICD-10-CM | POA: Diagnosis not present

## 2024-01-01 DIAGNOSIS — M47816 Spondylosis without myelopathy or radiculopathy, lumbar region: Secondary | ICD-10-CM | POA: Diagnosis not present

## 2024-01-01 DIAGNOSIS — R001 Bradycardia, unspecified: Secondary | ICD-10-CM | POA: Diagnosis not present

## 2024-01-01 DIAGNOSIS — M549 Dorsalgia, unspecified: Secondary | ICD-10-CM | POA: Diagnosis not present

## 2024-01-01 DIAGNOSIS — Z79899 Other long term (current) drug therapy: Secondary | ICD-10-CM | POA: Insufficient documentation

## 2024-01-01 DIAGNOSIS — Z96652 Presence of left artificial knee joint: Secondary | ICD-10-CM | POA: Insufficient documentation

## 2024-01-01 DIAGNOSIS — R410 Disorientation, unspecified: Secondary | ICD-10-CM | POA: Diagnosis not present

## 2024-01-01 DIAGNOSIS — M5136 Other intervertebral disc degeneration, lumbar region with discogenic back pain only: Secondary | ICD-10-CM | POA: Diagnosis not present

## 2024-01-01 DIAGNOSIS — M25562 Pain in left knee: Secondary | ICD-10-CM | POA: Diagnosis not present

## 2024-01-01 DIAGNOSIS — M2578 Osteophyte, vertebrae: Secondary | ICD-10-CM | POA: Diagnosis not present

## 2024-01-01 LAB — CBC WITH DIFFERENTIAL/PLATELET
Abs Immature Granulocytes: 0.09 10*3/uL — ABNORMAL HIGH (ref 0.00–0.07)
Basophils Absolute: 0 10*3/uL (ref 0.0–0.1)
Basophils Relative: 0 %
Eosinophils Absolute: 0.2 10*3/uL (ref 0.0–0.5)
Eosinophils Relative: 2 %
HCT: 45.3 % (ref 36.0–46.0)
Hemoglobin: 14.3 g/dL (ref 12.0–15.0)
Immature Granulocytes: 1 %
Lymphocytes Relative: 26 %
Lymphs Abs: 1.8 10*3/uL (ref 0.7–4.0)
MCH: 30.8 pg (ref 26.0–34.0)
MCHC: 31.6 g/dL (ref 30.0–36.0)
MCV: 97.6 fL (ref 80.0–100.0)
Monocytes Absolute: 0.6 10*3/uL (ref 0.1–1.0)
Monocytes Relative: 9 %
Neutro Abs: 4.3 10*3/uL (ref 1.7–7.7)
Neutrophils Relative %: 62 %
Platelets: 234 10*3/uL (ref 150–400)
RBC: 4.64 MIL/uL (ref 3.87–5.11)
RDW: 12.2 % (ref 11.5–15.5)
WBC: 7 10*3/uL (ref 4.0–10.5)
nRBC: 0 % (ref 0.0–0.2)

## 2024-01-01 LAB — RAPID URINE DRUG SCREEN, HOSP PERFORMED
Amphetamines: NOT DETECTED
Barbiturates: NOT DETECTED
Benzodiazepines: POSITIVE — AB
Cocaine: NOT DETECTED
Opiates: NOT DETECTED
Tetrahydrocannabinol: NOT DETECTED

## 2024-01-01 LAB — URINALYSIS, ROUTINE W REFLEX MICROSCOPIC
Bilirubin Urine: NEGATIVE
Glucose, UA: NEGATIVE mg/dL
Hgb urine dipstick: NEGATIVE
Ketones, ur: NEGATIVE mg/dL
Nitrite: NEGATIVE
Protein, ur: NEGATIVE mg/dL
Specific Gravity, Urine: 1.013 (ref 1.005–1.030)
pH: 5 (ref 5.0–8.0)

## 2024-01-01 LAB — COMPREHENSIVE METABOLIC PANEL WITH GFR
ALT: 20 U/L (ref 0–44)
AST: 27 U/L (ref 15–41)
Albumin: 3.7 g/dL (ref 3.5–5.0)
Alkaline Phosphatase: 36 U/L — ABNORMAL LOW (ref 38–126)
Anion gap: 7 (ref 5–15)
BUN: 23 mg/dL (ref 8–23)
CO2: 29 mmol/L (ref 22–32)
Calcium: 9.1 mg/dL (ref 8.9–10.3)
Chloride: 104 mmol/L (ref 98–111)
Creatinine, Ser: 0.82 mg/dL (ref 0.44–1.00)
GFR, Estimated: >60 mL/min (ref >60)
Glucose, Bld: 83 mg/dL (ref 70–99)
Potassium: 4.5 mmol/L (ref 3.5–5.1)
Sodium: 140 mmol/L (ref 135–145)
Total Bilirubin: 1.1 mg/dL (ref 0.0–1.2)
Total Protein: 6.3 g/dL — ABNORMAL LOW (ref 6.5–8.1)

## 2024-01-01 LAB — TSH: TSH: 0.306 u[IU]/mL — ABNORMAL LOW (ref 0.350–4.500)

## 2024-01-01 NOTE — ED Notes (Signed)
 [facility] called

## 2024-01-01 NOTE — ED Triage Notes (Addendum)
 Patient BIB EMS from home C/o fall this morning  Patient broke phone during fall, put sign in her back car window stating CALL 911 Passerby called 911 EMS reports little food in the home, appears to be compliant with medications; but a/o x 2  C/o shobr with EMS - 98% RA, clear lung sounds   Patient reports her back hurts and legs hurt and have hurt for a good bit - patient says she fell last night Says she wants to go home, came here because she's sick and cannot breathe Patient provides name/dob/year - but states she doesn't want to talk    Ems reports patient is confused at baseline, but appears more confused today

## 2024-01-01 NOTE — ED Provider Notes (Signed)
 [place] EMERGENCY DEPARTMENT AT [facility] Provider Note   CSN: [id number] Arrival date & time: 01/01/24  1241     History  Chief Complaint  Patient presents with   Back Pain    Dawn Kim is a 78 y.o. 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 She reports that she falls often when her thyroid is low. She had a mechanical fall this morning and could not get up without assistance. She lives alone. Her phone broke during the fall so she scooted to her front door with a sign that said "call 911" and a neighbor called. She complains of lower back pain, bilateral knee pain, and right ankle pain. She denies hitting her head or LOC. She denies bleeding, dizziness, confusion, syncope.  She reports frequent fall and "nerve pain " in her feet. She does not use a walker. She reports that her R knee "gives way" without warning. She has previous CT scans 1 year ago that showed retropulsion and multiple acute and chronic fractures. Add'l hx by phone call with the patient's daughter.  She reports that patient is at her baseline mental status.  Daughter reports "I do not think she falls a lot."  But does report she has a history of falls.   Back Pain      Home Medications Prior to Admission medications   Medication Sig Start Date End Date Taking? Authorizing Provider  acetaminophen (TYLENOL) 325 MG tablet Take 650 mg by mouth every 6 (six) hours as needed for mild pain or moderate pain.     [provider]  ALPRAZolam (XANAX) 0.5 MG tablet TAKE 1 TABLET BY MOUTH THREE TIMES DAILY AS NEEDED FOR ANXIETY . DO NOT EXCEED 3 PER 24 HOURS 12/26/23   Burns, Bobette Mo, MD  calcium carbonate (OS-CAL - DOSED IN MG OF ELEMENTAL CALCIUM) 1250 (500 Ca) MG tablet Take 1 tablet (500 mg of elemental calcium total) by mouth daily. 05/31/16   Nche, Bonna Gains, NP  cholecalciferol (VITAMIN  D) 1000 UNITS tablet Take 5,000 Units by mouth daily.    [provider]  famotidine (PEPCID) 40 MG tablet TAKE 1 TABLET BY MOUTH AT BEDTIME 11/24/23   Burns, Bobette Mo, MD  ferrous sulfate 325 (65 FE) MG EC tablet Take 325 mg by mouth daily with breakfast.    [provider]  fish oil-omega-3 fatty acids 1000 MG capsule Take 1 g by mouth 2 (two) times daily.     [provider]  furosemide (LASIX) 20 MG tablet Take 1 tablet by mouth once daily 10/10/23   Pincus Sanes, MD  levocetirizine (XYZAL) 5 MG tablet TAKE 1 TABLET BY MOUTH ONCE DAILY IN THE EVENING 11/07/23   Pincus Sanes, MD  levothyroxine (SYNTHROID) 125 MCG tablet TAKE 1 TABLET BY MOUTH ONCE DAILY. FOR THYROID** NEW DOSE** 10/24/23   Pincus Sanes, MD  Multiple Vitamin (MULTIVITAMIN) tablet Take 1 tablet by mouth daily.    [provider]  omeprazole (PRILOSEC) 40 MG capsule TAKE 1 CAPSULE BY MOUTH TWICE DAILY BEFORE A MEAL 07/04/23   Burns, Bobette Mo, MD  potassium chloride (KLOR-CON M) 10 MEQ tablet Take 2 tablets (20 mEq total) by mouth daily. 01/19/23   Pincus Sanes, MD  simvastatin (ZOCOR) 40 MG tablet Take 1 tablet by mouth in the evening 12/05/23   Pincus Sanes, MD  traZODone (DESYREL) 100 MG tablet Take 1 tablet (100 mg total) by mouth at bedtime. 07/25/23   Pincus Sanes, MD  valsartan (DIOVAN) 80 MG tablet Take 1 tablet by mouth once daily for blood pressure 10/17/23   Pincus Sanes, MD  venlafaxine XR (EFFEXOR-XR) 75 MG 24 hr capsule TAKE 1 CAPSULE BY MOUTH ONCE DAILY WITH BREAKFAST 10/17/23   Pincus Sanes, MD      Allergies    Penicillins, Eszopiclone, Gabapentin, Sonata [zaleplon], Ambien [zolpidem], and Amoxicillin    Review of Systems   Review of Systems  Musculoskeletal:  Positive for back pain.    Physical Exam Updated Vital Signs BP (!) 154/60   Pulse (!) 57   Temp 98.8 F (37.1 C) (Oral)   Resp 17   SpO2 97%  Physical Exam Vitals and nursing note reviewed.   Constitutional:      General: She is not in acute distress.    Appearance: She is well-developed. She is not diaphoretic.  HENT:     Head: Normocephalic and atraumatic.     Right Ear: External ear normal.     Left Ear: External ear normal.     Nose: Nose normal.     Mouth/Throat:     Mouth: Mucous membranes are moist.  Eyes:     General: No scleral icterus.    Conjunctiva/sclera: Conjunctivae normal.  Cardiovascular:     Rate and Rhythm: Normal rate and regular rhythm.     Heart sounds: Normal heart sounds. No murmur heard.    No friction rub. No gallop.  Pulmonary:     Effort: Pulmonary effort is normal. No respiratory distress.     Breath sounds: Normal breath sounds.  Abdominal:     General: Bowel sounds are normal. There is no distension.     Palpations: Abdomen is soft. There is no mass.     Tenderness: There is no abdominal tenderness. There is no guarding.  Musculoskeletal:     Cervical back: Normal range of motion.  Skin:    General: Skin is warm and dry.  Neurological:     Mental Status: She is alert and oriented to person, place, and time.  Psychiatric:        Behavior: Behavior normal.    ED Results / Procedures / Treatments   Labs (all labs ordered are listed, but only abnormal results are displayed) Labs Reviewed  URINALYSIS, ROUTINE W REFLEX MICROSCOPIC  COMPREHENSIVE METABOLIC PANEL WITH GFR  CBC WITH DIFFERENTIAL/PLATELET  RAPID URINE DRUG SCREEN, HOSP PERFORMED    EKG None  Radiology No results found.  Procedures Procedures    Medications Ordered in ED Medications - No data to display  ED Course/ Medical Decision Making/ A&P                                 Medical Decision Making Amount and/or Complexity of Data Reviewed Labs: ordered. Radiology: ordered.   Patient here for recurrent falls.  She states is because her knees give out on her.  Differential diagnosis for weakness also includes neurogenic claudication, myelopathy.  She  has normal strength in her bilateral lower extremities.  There are no obvious signs of deformity, bruising.  She has maintained reflexes.  I reviewed the patient's labs which are without acute finding.  Her urine seems to show asymptomatic bacteria and she is at her baseline mental status.  I visualized interpreted all images.  No acute findings on lumbar or knee x-ray.  Patient appears otherwise appropriate for discharge at this time is asking to be discharged.        Final Clinical Impression(s) / ED Diagnoses Final diagnoses:  None    Rx / DC Orders ED Discharge Orders     None         Arthor Captain, PA-C 01/01/24 1708    Terrilee Files, MD 01/02/24 1119

## 2024-01-01 NOTE — Discharge Instructions (Signed)
 SEEK IMMEDIATE MEDICAL ATTENTION IF: New numbness, tingling, weakness, or problem with the use of your arms or legs.  Severe back pain not relieved with medications.  Change in bowel or bladder control.  Increasing pain in any areas of the body (such as chest or abdominal pain).  Shortness of breath, dizziness or fainting.  Nausea (feeling sick to your stomach), vomiting, fever, or sweats.

## 2024-01-05 ENCOUNTER — Other Ambulatory Visit: Payer: Self-pay | Admitting: Internal Medicine

## 2024-01-10 ENCOUNTER — Other Ambulatory Visit: Payer: Self-pay | Admitting: Internal Medicine

## 2024-01-10 ENCOUNTER — Telehealth: Payer: Self-pay | Admitting: Internal Medicine

## 2024-01-10 NOTE — Telephone Encounter (Signed)
 I did not call patient regarding any scripts.  Called and left message

## 2024-01-10 NOTE — Telephone Encounter (Signed)
 Copied from CRM [id number]. Topic: Clinical - Medication Refill >> Jan 10, 2024 11:41 AM Alcus Dad wrote: Patient missed call about prescription furosemide (LASIX) 20 MG tablet [[id number]]. Patient just got this medication on 4/4.

## 2024-01-22 NOTE — Progress Notes (Unsigned)
 Subjective:    Patient ID: Dawn Kim, female    DOB: 1945-12-07, 78 y.o.   MRN: [id number]     HPI Dawn Kim is here for follow up of her chronic medical problems.    Medications and allergies reviewed with patient and updated if appropriate.  Current Outpatient Medications on File Prior to Visit  Medication Sig Dispense Refill   acetaminophen  (TYLENOL ) 325 MG tablet Take 650 mg by mouth every 6 (six) hours as needed for mild pain or moderate pain.      ALPRAZolam  (XANAX ) 0.5 MG tablet TAKE 1 TABLET BY MOUTH THREE TIMES DAILY AS NEEDED FOR ANXIETY . DO NOT EXCEED 3 PER 24 HOURS 90 tablet 0   calcium  carbonate (OS-CAL - DOSED IN MG OF ELEMENTAL CALCIUM ) 1250 (500 Ca) MG tablet Take 1 tablet (500 mg of elemental calcium  total) by mouth daily.     cholecalciferol  (VITAMIN D ) 1000 UNITS tablet Take 5,000 Units by mouth daily.     famotidine  (PEPCID ) 40 MG tablet TAKE 1 TABLET BY MOUTH AT BEDTIME 90 tablet 0   ferrous sulfate  325 (65 FE) MG EC tablet Take 325 mg by mouth daily with breakfast.     fish oil-omega-3 fatty acids 1000 MG capsule Take 1 g by mouth 2 (two) times daily.      furosemide  (LASIX ) 20 MG tablet Take 1 tablet by mouth once daily 90 tablet 0   levocetirizine (XYZAL ) 5 MG tablet TAKE 1 TABLET BY MOUTH ONCE DAILY IN THE EVENING 90 tablet 0   levothyroxine  (SYNTHROID ) 125 MCG tablet TAKE 1 TABLET BY MOUTH ONCE DAILY. FOR THYROID ** NEW DOSE** 90 tablet 0   Multiple Vitamin (MULTIVITAMIN) tablet Take 1 tablet by mouth daily.     omeprazole  (PRILOSEC) 40 MG capsule TAKE 1 CAPSULE BY MOUTH TWICE DAILY BEFORE A MEAL 180 capsule 2   potassium chloride  (KLOR-CON  M) 10 MEQ tablet Take 2 tablets (20 mEq total) by mouth daily. 180 tablet 1   simvastatin  (ZOCOR ) 40 MG tablet Take 1 tablet by mouth in the evening 90 tablet 0   traZODone  (DESYREL ) 100 MG tablet TAKE 1 TABLET BY MOUTH AT BEDTIME 90 tablet 0   valsartan  (DIOVAN ) 80 MG tablet Take 1 tablet by mouth once daily for  blood pressure 90 tablet 0   venlafaxine  XR (EFFEXOR -XR) 75 MG 24 hr capsule TAKE 1 CAPSULE BY MOUTH ONCE DAILY WITH BREAKFAST 90 capsule 0   No current facility-administered medications on file prior to visit.     Review of Systems     Objective:  There were no vitals filed for this visit. BP Readings from Last 3 Encounters:  01/01/24 130/82  07/25/23 138/86  05/22/23 (!) 122/59   Wt Readings from Last 3 Encounters:  03/02/22 179 lb (81.2 kg)  09/01/21 178 lb (80.7 kg)  03/30/21 186 lb 6.4 oz (84.6 kg)   There is no height or weight on file to calculate BMI.    Physical Exam     Lab Results  Component Value Date   WBC 7.0 01/01/2024   HGB 14.3 01/01/2024   HCT 45.3 01/01/2024   PLT 234 01/01/2024   GLUCOSE 83 01/01/2024   CHOL 199 07/25/2023   TRIG 133.0 07/25/2023   HDL 56.70 07/25/2023   LDLDIRECT 86.0 09/01/2021   LDLCALC 116 (H) 07/25/2023   ALT 20 01/01/2024   AST 27 01/01/2024   NA 140 01/01/2024   K 4.5 01/01/2024   CL 104  01/01/2024   CREATININE 0.82 01/01/2024   BUN 23 01/01/2024   CO2 29 01/01/2024   TSH 0.306 (L) 01/01/2024   INR 2.33 (H) 12/02/2009   HGBA1C 5.2 01/17/2023     Assessment & Plan:    See Problem List for Assessment and Plan of chronic medical problems.

## 2024-01-22 NOTE — Patient Instructions (Addendum)
      Blood work was ordered.       Medications changes include :   decrease dose of venlafaxine  to 37.5 mg daily x 1 month.  We can start something new after you are done with this.     A referral was ordered for neurology and care management and someone will call you to schedule an appointment.     Return in about 6 months (around 07/24/2024) for Physical Exam.

## 2024-01-23 ENCOUNTER — Encounter: Payer: Self-pay | Admitting: Internal Medicine

## 2024-01-23 ENCOUNTER — Ambulatory Visit (INDEPENDENT_AMBULATORY_CARE_PROVIDER_SITE_OTHER): Payer: Medicare HMO | Admitting: Internal Medicine

## 2024-01-23 VITALS — BP 136/70 | HR 61 | Ht 63.0 in

## 2024-01-23 DIAGNOSIS — R600 Localized edema: Secondary | ICD-10-CM

## 2024-01-23 DIAGNOSIS — N1831 Chronic kidney disease, stage 3a: Secondary | ICD-10-CM

## 2024-01-23 DIAGNOSIS — I10 Essential (primary) hypertension: Secondary | ICD-10-CM

## 2024-01-23 DIAGNOSIS — G4709 Other insomnia: Secondary | ICD-10-CM | POA: Diagnosis not present

## 2024-01-23 DIAGNOSIS — E7800 Pure hypercholesterolemia, unspecified: Secondary | ICD-10-CM | POA: Diagnosis not present

## 2024-01-23 DIAGNOSIS — E039 Hypothyroidism, unspecified: Secondary | ICD-10-CM | POA: Diagnosis not present

## 2024-01-23 DIAGNOSIS — M85839 Other specified disorders of bone density and structure, unspecified forearm: Secondary | ICD-10-CM

## 2024-01-23 DIAGNOSIS — R413 Other amnesia: Secondary | ICD-10-CM

## 2024-01-23 DIAGNOSIS — E2839 Other primary ovarian failure: Secondary | ICD-10-CM

## 2024-01-23 DIAGNOSIS — R296 Repeated falls: Secondary | ICD-10-CM

## 2024-01-23 DIAGNOSIS — F32A Depression, unspecified: Secondary | ICD-10-CM

## 2024-01-23 DIAGNOSIS — K219 Gastro-esophageal reflux disease without esophagitis: Secondary | ICD-10-CM | POA: Diagnosis not present

## 2024-01-23 DIAGNOSIS — F419 Anxiety disorder, unspecified: Secondary | ICD-10-CM | POA: Diagnosis not present

## 2024-01-23 LAB — COMPREHENSIVE METABOLIC PANEL WITH GFR
ALT: 13 U/L (ref 0–35)
AST: 15 U/L (ref 0–37)
Albumin: 4.2 g/dL (ref 3.5–5.2)
Alkaline Phosphatase: 51 U/L (ref 39–117)
BUN: 23 mg/dL (ref 6–23)
CO2: 31 meq/L (ref 19–32)
Calcium: 9.5 mg/dL (ref 8.4–10.5)
Chloride: 101 meq/L (ref 96–112)
Creatinine, Ser: 1.07 mg/dL (ref 0.40–1.20)
GFR: 49.35 mL/min — ABNORMAL LOW (ref >60.00)
Glucose, Bld: 95 mg/dL (ref 70–99)
Potassium: 4.2 meq/L (ref 3.5–5.1)
Sodium: 140 meq/L (ref 135–145)
Total Bilirubin: 0.5 mg/dL (ref 0.2–1.2)
Total Protein: 7 g/dL (ref 6.0–8.3)

## 2024-01-23 LAB — CBC WITH DIFFERENTIAL/PLATELET
Basophils Absolute: 0 10*3/uL (ref 0.0–0.1)
Basophils Relative: 0.2 % (ref 0.0–3.0)
Eosinophils Absolute: 0.2 10*3/uL (ref 0.0–0.7)
Eosinophils Relative: 1.8 % (ref 0.0–5.0)
HCT: 44.1 % (ref 36.0–46.0)
Hemoglobin: 14.5 g/dL (ref 12.0–15.0)
Lymphocytes Relative: 25.5 % (ref 12.0–46.0)
Lymphs Abs: 2.5 10*3/uL (ref 0.7–4.0)
MCHC: 32.9 g/dL (ref 30.0–36.0)
MCV: 94.2 fl (ref 78.0–100.0)
Monocytes Absolute: 0.7 10*3/uL (ref 0.1–1.0)
Monocytes Relative: 7.3 % (ref 3.0–12.0)
Neutro Abs: 6.5 10*3/uL (ref 1.4–7.7)
Neutrophils Relative %: 65.2 % (ref 43.0–77.0)
Platelets: 310 10*3/uL (ref 150.0–400.0)
RBC: 4.69 Mil/uL (ref 3.87–5.11)
RDW: 13.1 % (ref 11.5–15.5)
WBC: 9.9 10*3/uL (ref 4.0–10.5)

## 2024-01-23 LAB — LIPID PANEL
Cholesterol: 159 mg/dL (ref 0–200)
HDL: 46.5 mg/dL (ref >39.00)
LDL Cholesterol: 83 mg/dL (ref 0–99)
NonHDL: 112.08
Total CHOL/HDL Ratio: 3
Triglycerides: 143 mg/dL (ref 0.0–149.0)
VLDL: 28.6 mg/dL (ref 0.0–40.0)

## 2024-01-23 LAB — TSH: TSH: 2.11 u[IU]/mL (ref 0.35–5.50)

## 2024-01-23 LAB — VITAMIN B12: Vitamin B-12: 786 pg/mL (ref 211–911)

## 2024-01-23 LAB — VITAMIN D 25 HYDROXY (VIT D DEFICIENCY, FRACTURES): VITD: 48.6 ng/mL (ref 30.00–100.00)

## 2024-01-23 MED ORDER — ALPRAZOLAM 0.5 MG PO TABS: ORAL_TABLET | ORAL | 0 refills | Status: DC

## 2024-01-23 MED ORDER — VENLAFAXINE HCL ER 37.5 MG PO CP24: 37.5000 mg | ORAL_CAPSULE | Freq: Every day | ORAL | 0 refills | Status: DC

## 2024-01-23 NOTE — Assessment & Plan Note (Addendum)
 Chronic BP slightly elevated - ok for age Continue diovan  80 mg daily Low sodium diet cmp

## 2024-01-23 NOTE — Assessment & Plan Note (Signed)
Chronic °Check lipid panel  °Continue simvastatin 40 mg daily °

## 2024-01-23 NOTE — Assessment & Plan Note (Signed)
 Chronic Likely multifactorial Has chronic severe b/l Knee OA, chronic back pain and physical deconditioning all likely contributing Using cane or walker  Should not be living by herself - daughter would like to look into options - referral to care management

## 2024-01-23 NOTE — Assessment & Plan Note (Signed)
Chronic GERD controlled Continue famotidine 40 mg daily, omeprazole 40 mg bid  

## 2024-01-23 NOTE — Assessment & Plan Note (Signed)
Chronic  Clinically euthyroid Check tsh and will titrate med dose if needed Currently taking levothyroxine 125 mcg daily  

## 2024-01-23 NOTE — Assessment & Plan Note (Signed)
 Chronic  improved elevate legs as much as possible when sitting She is sedentary  - advised walking as much as possible  Low sodium diet Continue lasix  20 mg daily

## 2024-01-23 NOTE — Assessment & Plan Note (Signed)
 Chronic DEXA due-she deferred Not exercising regularly Continue calcium  and vitamin D  supplementation Check vitamin d  daily

## 2024-01-23 NOTE — Assessment & Plan Note (Signed)
 Chronic Several medication have not been effective Continue  trazodone  100 mg nightly Stress no caffeine except for first thing in the morning and to keep it to a minimum Advise not napping during the day

## 2024-01-23 NOTE — Assessment & Plan Note (Signed)
 Daughter has concerns regarding her memory Patient  has denied issues up until now but agrees today to be evaluated Referral to neurology Tsh, B12

## 2024-01-23 NOTE — Assessment & Plan Note (Signed)
 Chronic Not controlled Anxiety and depression varies Currently taking Effexor  75 mg daily and alprazolam  0.5 mg 3 times daily Will taper off effexor  - decrease dose to 37.5 mg daily x 1 month and then stop Can start a different SSRI after off this Continue alprazolam  0.5 mg tid -- would like to eventually decrease this dose and ideally get her off that medication

## 2024-01-23 NOTE — Assessment & Plan Note (Signed)
Chronic CMP, CBC 

## 2024-01-25 ENCOUNTER — Other Ambulatory Visit: Payer: Self-pay | Admitting: Internal Medicine

## 2024-01-26 ENCOUNTER — Telehealth: Payer: Self-pay | Admitting: *Deleted

## 2024-01-26 NOTE — Progress Notes (Signed)
 Complex Care Management Note  Care Guide Note 01/26/2024 Name: ORLANDO THALMANN MRN: [id number] DOB: Jun 04, 1946  MERCED BROUGHAM is a 78 y.o. year old female who sees Burns, Beckey Bourgeois, MD for primary care. I reached out to Boni Busman by phone today to offer complex care management services.  Ms. Pol was given information about Complex Care Management services today including:   The Complex Care Management services include support from the care team which includes your Nurse Care Manager, Clinical Social Worker, or Pharmacist.  The Complex Care Management team is here to help remove barriers to the health concerns and goals most important to you. Complex Care Management services are voluntary, and the patient may decline or stop services at any time by request to their care team member.   Complex Care Management Consent Status: Patient agreed to services and verbal consent obtained.   Follow up plan:  Telephone appointment with complex care management team member scheduled for:  02/14/2024  Encounter Outcome:  Patient Scheduled  Kandis Ormond, CMA [place]  [facility], [facility] Guide Direct Dial: [phone]  Fax: [phone] Website: [email or web address]

## 2024-02-05 ENCOUNTER — Other Ambulatory Visit: Payer: Self-pay | Admitting: Internal Medicine

## 2024-02-08 ENCOUNTER — Other Ambulatory Visit: Payer: Self-pay | Admitting: Internal Medicine

## 2024-02-08 ENCOUNTER — Telehealth: Payer: Self-pay | Admitting: *Deleted

## 2024-02-08 NOTE — Progress Notes (Signed)
 Complex Care Management Care Guide Note  02/08/2024 Name: Dawn Kim MRN: [id number] DOB: Feb 03, 1946  Dawn Kim is a 78 y.o. year old female who is a primary care patient of Colene Dauphin, MD and is actively engaged with the care management team. I reached out to Boni Busman by phone today to assist with re-scheduling  with the Licensed Clinical Child psychotherapist.  Follow up plan: Unsuccessful telephone outreach attempt made. A HIPAA compliant phone message was left for the patient providing contact information and requesting a return call.  Barnie Bora  [facility]  Value[facility]Based Care Institute, [facility] Guide  Direct Dial: [phone]  Fax [phone]

## 2024-02-10 ENCOUNTER — Ambulatory Visit: Payer: Self-pay

## 2024-02-10 ENCOUNTER — Other Ambulatory Visit: Payer: Self-pay | Admitting: Internal Medicine

## 2024-02-10 MED ORDER — SERTRALINE HCL 50 MG PO TABS: 50.0000 mg | ORAL_TABLET | Freq: Every day | ORAL | 5 refills | Status: DC

## 2024-02-10 NOTE — Telephone Encounter (Signed)
 Sent in sertraline  for her to start taking - one pill a day and we can increase as needed

## 2024-02-10 NOTE — Telephone Encounter (Signed)
 We decided to get her off the Effexor  which is why she is almost done with it.  As we discussed at her last visit I told her we can start a different medication when she is office if she wishes.  So if she is feeling depressed or anxious I would recommend that we start a different medication now that she is almost off the Effexor .  If her shoulder is bothering her I would recommend seeing sports medicine-can refer if needed.

## 2024-02-10 NOTE — Telephone Encounter (Signed)
 Chief Complaint: "feeling down" Symptoms: "down, "sad" Frequency: since Effexor  dose was decreased 4/21 Pertinent Negatives: Patient denies SI or HI Disposition: [] ED /[] Urgent Care (no appt availability in office) / [] Appointment(In office/virtual)/ []  [facility]/ [] Home Care/ [] Refused Recommended Disposition /[] [facility]/ [x]  Follow-up with PCP Additional Notes: Pt's Effexor  dosage was decreased 4/21. Pt states she medication will be finished 5/21. Pt is wondering what she should take or do after that. Pt states she notices a difference since the dosage was decreased. Pt states "it's not working." Pt endorses feeling down and sad. Pt denies HI or SI. Denies functional impairment and states she can still perform ADLs, but dose endorse she doesn't leave the house much. Pt states she prefers it that way. Pt reports decreased energy. Pt states she fell in her kitchen last month and her R shoulder still hurts from that fall. Pt reports 7-8/10 R shoulder pain at night and uses Tylenol  and Voltaren  gel. RN advised pt RN would relay symptoms and concerns about medication adjustments to the office for follow-up, as well as symptoms related to her R shoulder. RN advised pt if she becomes weak or unable to perform ADLs or care for herself, or if she has thoughts of hurting herself or others, she should go to the ED. Pt verbalized understanding.    Copied from CRM [id number]. Topic: Clinical - Medication Question >> Feb 10, 2024  8:41 AM Jenice Mitts wrote: Reason for CRM: Patient is calling because she doesn't have any refills on her venlafaxine  XR (EFFEXOR -XR) 37.5 MG 24 hr capsule because it's not working for her, so she wanting to know is there gonna be more of this medication or is she gonna be prescribed something else She would like a call to know what to do before the medication runs out this month   Number on file is current Answer Assessment - Initial Assessment Questions 1.  CONCERN: "What happened that made you call today?"     Effexor  is not working - dosage was decreased 2. DEPRESSION SYMPTOM SCREENING: "How are you feeling overall?" (e.g., decreased energy, increased sleeping or difficulty sleeping, difficulty concentrating, feelings of sadness, guilt, hopelessness, or worthlessness)     "Sad", "down" 3. RISK OF HARM - SUICIDAL IDEATION:  "Do you ever have thoughts of hurting or killing yourself?"  (e.g., yes, no, no but preoccupation with thoughts about death)   - INTENT:  "Do you have thoughts of hurting or killing yourself right NOW?" (e.g., yes, no, N/A)   - PLAN: "Do you have a specific plan for how you would do this?" (e.g., gun, knife, overdose, no plan, N/A)     No - "I'm  a Christian" 4. RISK OF HARM - HOMICIDAL IDEATION:  "Do you ever have thoughts of hurting or killing someone else?"  (e.g., yes, no, no but preoccupation with thoughts about death)   - INTENT:  "Do you have thoughts of hurting or killing someone right NOW?" (e.g., yes, no, N/A)   - PLAN: "Do you have a specific plan for how you would do this?" (e.g., gun, knife, no plan, N/A)      No  5. FUNCTIONAL IMPAIRMENT: "How have things been going for you overall? Have you had more difficulty than usual doing your normal daily activities?"  (e.g., better, same, worse; self-care, school, work, Curator)     States she feels down/decreased energy since Effexor  dose was decreased 6. SUPPORT: "Who is with you now?" "Who do you  live with?" "Do you have family or friends who you can talk to?"      "Most of the people I have known in my life have passed except for one person", mentions a daughter 84. STRESSORS: "Has there been any new stress or recent changes in your life?"     "Well my husband passed, he left it all on me (house), I am trying to hold on and stay here, my sister passed away in a nursing home. I went to so many to find the best place, I can't see myself there" 10. OTHER: "Do you have  any other physical symptoms right now?" (e.g., fever)    "I stay in my house right much, I don't go out much", "I'll go if I need to but I don't have the desire or ability, I am tired", has enough medicine Effexor  to last til 5/21.  "Ive fallen so much I dislocated my shoulder twice", "I'm not one to take a lot of medicines." Endorses she slipped on the kitchen floor 4/12 and hurt her R shoulder. It still hurts. "When will it stop hurting?" Uses Tylenol  and Voltaren . At night it hurts 7-8/10, "enough to keep me up." "So I just get up and take some more Tylenol ."  Protocols used: Depression-A-AH

## 2024-02-13 ENCOUNTER — Encounter: Payer: Self-pay | Admitting: Physician Assistant

## 2024-02-14 ENCOUNTER — Telehealth: Payer: Self-pay | Admitting: *Deleted

## 2024-02-15 ENCOUNTER — Telehealth: Payer: Self-pay | Admitting: Internal Medicine

## 2024-02-15 NOTE — Telephone Encounter (Signed)
 Copied from CRM [id number]. Topic: General - Other >> Feb 15, 2024  9:12 AM Albertha Alosa wrote: Reason for CRM: Patient daughter Dawn Kim called in regarding call from social worker for patient, stated she was scheduled to speak with her yesterday and no one called would like for someone contact her to get rescheduled to speak with the social worker her callback number is  [phone]  ---  Not sure who to direct pt to, please assist.

## 2024-02-16 NOTE — Progress Notes (Unsigned)
 Complex Care Management Care Guide Note  02/16/2024 Name: Dawn Kim MRN: [id number] DOB: 09-26-1946  Dawn Kim is a 78 y.o. year old female who is a primary care patient of Colene Dauphin, MD and is actively engaged with the care management team. I reached out to Boni Busman by phone today to assist with re-scheduling  with the Licensed Clinical Child psychotherapist.  Follow up plan: Unsuccessful telephone outreach attempt made. A HIPAA compliant phone message was left for the patient providing contact information and requesting a return call.  Barnie Bora  [facility]  Value-Based Care Institute, [facility] Guide  Direct Dial: [phone]  Fax [phone]

## 2024-02-17 NOTE — Progress Notes (Signed)
 Complex Care Management Care Guide Note  02/17/2024 Name: NITA LETSCHE MRN: [id number] DOB: 1946-03-11  EMMALYSE NICHOLES is a 78 y.o. year old female who is a primary care patient of Colene Dauphin, MD and is actively engaged with the care management team. I reached out to Boni Busman by phone today to assist with re-scheduling  with the Licensed Clinical Child psychotherapist.  Follow up plan: Telephone appointment with complex care management team member scheduled for:  03/07/24 LCSW and 5/20 with BSW   Barnie Bora  [facility], [facility] Guide  Direct Dial: [phone]  Fax [phone]

## 2024-02-21 ENCOUNTER — Other Ambulatory Visit: Payer: Self-pay

## 2024-02-21 NOTE — Patient Instructions (Signed)
 Visit Information  Thank you for taking time to visit with me today. Please don't hesitate to contact me if I can be of assistance to you before our next scheduled appointment.  Our next appointment is by telephone on 03/13/24 at 10am Please call the care guide team at [phone] if you need to cancel or reschedule your appointment.    Please call the Suicide and Crisis Lifeline: 988 call the [place]  National Suicide Prevention Lifeline: (248)510-9940 or TTY: 970-119-8717 TTY 318-812-6768) to talk to a trained counselor call 1-800-273-TALK (toll free, 24 hour hotline) go to [facility] [address] [place] [phone]) call 911 if you are experiencing a Mental Health or Behavioral Health Crisis or need someone to talk to.  Patient verbalizes understanding of instructions and care plan provided today and agrees to view in MyChart. Active MyChart status and patient understanding of how to access instructions and care plan via MyChart confirmed with patient.     Haven Lion, BSW [place]  Value Based Care Institute Social Worker, [facility] [phone]

## 2024-02-21 NOTE — Patient Outreach (Signed)
 Complex Care Management   Visit Note  02/21/2024  Name:  Dawn Kim MRN: [id number] DOB: 26-Dec-1945  Situation: Referral received for Complex Care Management related to assisted living I obtained verbal consent from Caregiver.  Visit completed with caregiver.  on the phone   Assessment: BSW outreached patients daughter to speak about the options of assisted living, patient would prefer to stay in her own home and would prefer home aid. Resources were sent for aide services as well as a few community resources.   SDOH Interventions    Flowsheet Row Patient Outreach Telephone from 02/21/2024 in [place] [facility] Clinical Support from 10/11/2018 in [facility] Clinical Support from 10/06/2017 in [facility]  SDOH Interventions     Food Insecurity Interventions Community Resources Provided -- --  Housing Interventions Community Resources Provided -- --  Utilities Interventions Community Resources Provided -- --  Depression Interventions/Treatment  -- Medication  [pt reports improved mood,  refused additional counseling resources] Medication  Financial Strain Interventions Community Resources Provided -- --         Recommendation:   No recommendations at this time.  Follow Up Plan:   Telephone follow-up on 02/11/24 at 10am  Haven Lion, BSW [place]  Value Based Care Institute Social Worker, [facility] [phone]

## 2024-02-24 ENCOUNTER — Other Ambulatory Visit: Payer: Self-pay | Admitting: Internal Medicine

## 2024-02-24 DIAGNOSIS — F419 Anxiety disorder, unspecified: Secondary | ICD-10-CM

## 2024-02-24 MED ORDER — ALPRAZOLAM 0.5 MG PO TABS: ORAL_TABLET | ORAL | 0 refills | Status: DC

## 2024-02-24 NOTE — Telephone Encounter (Unsigned)
 Copied from CRM [id number]. Topic: Clinical - Medication Refill >> Feb 24, 2024  2:28 PM Dewanda Foots wrote: Medication: ALPRAZolam  (XANAX ) 0.5 MG tablet  Has the patient contacted their pharmacy? Yes (Agent: If no, request that the patient contact the pharmacy for the refill. If patient does not wish to contact the pharmacy document the reason why and proceed with request.) (Agent: If yes, when and what did the pharmacy advise?)  This is the patient's preferred pharmacy:  [facility] [address], [place] - [address] [place] [address] Phone: [phone] Fax: [phone]  Is this the correct pharmacy for this prescription? Yes If no, delete pharmacy and type the correct one.   Has the prescription been filled recently? No  Is the patient out of the medication? No, she has 2-3 more and she would like this filled today please. With the holiday, the pharmacy is going to be closed Monday  Has the patient been seen for an appointment in the last year OR does the patient have an upcoming appointment? Yes  Can we respond through MyChart? No, please call [phone] with any updates.   Agent: Please be advised that Rx refills may take up to 3 business days. We ask that you follow-up with your pharmacy.

## 2024-03-02 ENCOUNTER — Telehealth: Payer: Self-pay

## 2024-03-02 NOTE — Telephone Encounter (Signed)
 Spoke with patient today.

## 2024-03-02 NOTE — Telephone Encounter (Signed)
 We have discussed this several times in the past and she has tried several other medications and had side effects so we do not have many options.  I would recommend she continue with the trazodone  and try to do natural things that might help with her sleep.

## 2024-03-02 NOTE — Telephone Encounter (Signed)
 Copied from CRM [id number]. Topic: Clinical - Medical Advice >> Mar 02, 2024 10:13 AM Turkey A wrote: Reason for CRM: Patient called to let Dr.Burns traZODone  (DESYREL ) 100 MG tablet is not working for her at all. Patient took one hand half for two nights and it did not help at all. Patient would like something prescribed for rest

## 2024-03-07 ENCOUNTER — Encounter: Payer: Self-pay | Admitting: Licensed Clinical Social Worker

## 2024-03-07 ENCOUNTER — Telehealth: Payer: Self-pay | Admitting: *Deleted

## 2024-03-07 ENCOUNTER — Telehealth: Payer: Self-pay | Admitting: Licensed Clinical Social Worker

## 2024-03-10 ENCOUNTER — Other Ambulatory Visit: Payer: Self-pay | Admitting: Internal Medicine

## 2024-03-12 ENCOUNTER — Other Ambulatory Visit: Payer: Self-pay | Admitting: Internal Medicine

## 2024-03-12 MED ORDER — FAMOTIDINE 40 MG PO TABS: 40.0000 mg | ORAL_TABLET | Freq: Every day | ORAL | 0 refills | Status: DC

## 2024-03-12 NOTE — Telephone Encounter (Signed)
 Copied from CRM [id number]. Topic: Clinical - Medication Refill >> Mar 12, 2024  9:34 AM Zipporah Him wrote: Medication: famotidine  (PEPCID ) 40 MG tablet  Has the patient contacted their pharmacy? Yes States to remind dr  This is the patient's preferred pharmacy:  [facility] [address] [place] - [address] [place] [address] Phone: [phone] Fax: [phone]  Is this the correct pharmacy for this prescription? Yes If no, delete pharmacy and type the correct one.   Has the prescription been filled recently? Yes  Is the patient out of the medication? Yes  Has the patient been seen for an appointment in the last year OR does the patient have an upcoming appointment? Yes  Can we respond through MyChart? No  Agent: Please be advised that Rx refills may take up to 3 business days. We ask that you follow-up with your pharmacy.

## 2024-03-12 NOTE — Telephone Encounter (Signed)
 Copied from CRM [id number]. Topic: Clinical - Medication Refill >> Mar 12, 2024  9:33 AM Dawn Kim wrote: Medication: famotidine  (PEPCID ) 40 MG tablet  Has the patient contacted their pharmacy? Yes (Agent: If no, request that the patient contact the pharmacy for the refill. If patient does not wish to contact the pharmacy document the reason why and proceed with request.) (Agent: If yes, when and what did the pharmacy advise?)  This is the patient's preferred pharmacy:  [facility] [address] [place] - [address] [place] [address] Phone: [phone] Fax: [phone]  Is this the correct pharmacy for this prescription? Yes If no, delete pharmacy and type the correct one.   Has the prescription been filled recently? No  Is the patient out of the medication? Yes  Has the patient been seen for an appointment in the last year OR does the patient have an upcoming appointment? Yes  Can we respond through MyChart? No  Agent: Please be advised that Rx refills may take up to 3 business days. We ask that you follow-up with your pharmacy.

## 2024-03-13 ENCOUNTER — Other Ambulatory Visit: Payer: Self-pay

## 2024-03-13 ENCOUNTER — Other Ambulatory Visit: Payer: Self-pay | Admitting: Internal Medicine

## 2024-03-13 NOTE — Patient Outreach (Signed)
 Complex Care Management   Visit Note  03/13/2024  Name:  Dawn Kim MRN: [id number] DOB: Dec 21, 1945  Situation: Referral received for Complex Care Management related to SDOH Barriers:  Food insecurity and assisted living. I obtained verbal consent from Caregiver.  Visit completed with caregiver  on the phone  Background:   Past Medical History:  Diagnosis Date   Anxiety    Cataract    Bil   Depression    Hypercholesteremia    Hypertension    Thyroid  disease     Assessment: BSW outreached patients daughter Ennis Hart to follow up about the resources provided. Caregiver confirmed that they had received the resources and would reach out if they needed further assistance.       Recommendation:   No recommendations at this time.  Follow Up Plan:   Patient has met all care management goals. Care Management case will be closed. Patient has been provided contact information should new needs arise.   Haven Lion, BSW [place]  Value [facility] Care Institute Social Worker, [facility] [phone]

## 2024-03-13 NOTE — Patient Instructions (Addendum)
 Visit Information  Thank you for taking time to visit with me today. Please don't hesitate to contact me if I can be of assistance to you.  Your next care management appointment is no further scheduled appointments.   Patient has met all care management goals. Care Management case will be closed. Patient has been provided contact information should new needs arise.   Please call the care guide team at [phone] if you need to cancel, schedule, or reschedule an appointment.   Please call the Suicide and Crisis Lifeline: 988 call the [place]  National Suicide Prevention Lifeline: (416)601-1073 or TTY: 9154284269 TTY 8384538817) to talk to a trained counselor call 1-800-273-TALK (toll free, 24 hour hotline) go to [facility] [address], [place] [phone]) call 911 if you are experiencing a Mental Health or Behavioral Health Crisis or need someone to talk to.  Dawn Kim, BSW [place]  [facility] Social Worker, [facility] [phone]

## 2024-03-14 ENCOUNTER — Other Ambulatory Visit: Payer: Self-pay | Admitting: Internal Medicine

## 2024-03-15 ENCOUNTER — Other Ambulatory Visit: Payer: Self-pay | Admitting: Internal Medicine

## 2024-03-19 ENCOUNTER — Telehealth: Payer: Self-pay

## 2024-03-19 NOTE — Progress Notes (Signed)
 Complex Care Management Care Guide Note  03/19/2024 Name: Dawn Kim MRN: [id number] DOB: 01-Jul-1946  Dawn Kim is a 78 y.o. year old female who is a primary care patient of Colene Dauphin, MD and is actively engaged with the care management team. I reached out to Boni Busman by phone today to assist with re-scheduling  with the Licensed Clinical Child psychotherapist.  Follow up plan: Unsuccessful telephone outreach attempt made. A HIPAA compliant phone message was left for the patient providing contact information and requesting a return call.  [facility], [facility] Guide  Direct Dial: [phone]  Fax [phone]

## 2024-03-22 NOTE — Progress Notes (Signed)
 Complex Care Management Note  Care Guide Note 03/22/2024 Name: Dawn Kim MRN: [id number] DOB: 1946/01/25  Dawn Kim is a 78 y.o. year old female who sees Burns, Beckey Bourgeois, MD for primary care. I reached out to Boni Busman by phone today to offer complex care management services.  Ms. Callan was given information about Complex Care Management services today including:   The Complex Care Management services include support from the care team which includes your Nurse Care Manager, Clinical Social Worker, or Pharmacist.  The Complex Care Management team is here to help remove barriers to the health concerns and goals most important to you. Complex Care Management services are voluntary, and the patient may decline or stop services at any time by request to their care team member.   Complex Care Management Consent Status: Patient agreed to services and verbal consent obtained.   Follow up plan:  Telephone appointment with complex care management team member scheduled for:  03-13-24  Encounter Outcome:  Patient Scheduled   Dawn Kim [facility], [facility] Guide  Direct Dial: [phone]  Fax [phone]

## 2024-03-26 ENCOUNTER — Other Ambulatory Visit: Payer: Self-pay | Admitting: Internal Medicine

## 2024-03-26 DIAGNOSIS — F419 Anxiety disorder, unspecified: Secondary | ICD-10-CM

## 2024-04-01 ENCOUNTER — Other Ambulatory Visit: Payer: Self-pay | Admitting: Internal Medicine

## 2024-04-03 ENCOUNTER — Other Ambulatory Visit: Payer: Self-pay | Admitting: Internal Medicine

## 2024-04-16 ENCOUNTER — Ambulatory Visit: Admitting: Physician Assistant

## 2024-04-16 ENCOUNTER — Ambulatory Visit

## 2024-04-18 ENCOUNTER — Other Ambulatory Visit: Payer: Self-pay | Admitting: Internal Medicine

## 2024-04-18 DIAGNOSIS — F419 Anxiety disorder, unspecified: Secondary | ICD-10-CM

## 2024-04-19 NOTE — Telephone Encounter (Signed)
 Copied from CRM [id number]. Topic: Clinical - Medication Question >> Apr 19, 2024 10:01 AM Chasity T wrote: Reason for CRM: Patient is calling in regarding medication ALPRAZolam  (XANAX ) 0.5 MG tablet and wants to see if that can be sent over today so she can pick both medications at once.

## 2024-05-01 ENCOUNTER — Telehealth: Payer: Self-pay | Admitting: Internal Medicine

## 2024-05-01 NOTE — Telephone Encounter (Unsigned)
 Copied from CRM #[id number]. Topic: General - Other >> May 01, 2024 10:14 AM Zebedee SAUNDERS wrote: Reason for CRM: Pt wants lab work for arthritis. Pt stated she discussed with Dr. Geofm. Pt wants someone to call her back at [phone].

## 2024-05-01 NOTE — Telephone Encounter (Signed)
 We should probably do this with her next visit so we make sure we order the correct blood work.  If she wishes we can move up her routine follow-up appointment.  I do not think she has any autoimmune arthritis-I do think hers is osteoarthritis, but we can check blood work with her next appointment.

## 2024-05-02 NOTE — Telephone Encounter (Signed)
 Spoke with patient this morning and she has appointment on 8/11.

## 2024-05-13 NOTE — Patient Instructions (Addendum)
      Blood work was ordered.       Medications changes include :   None    A referral was ordered and someone will call you to schedule an appointment.     No follow-ups on file.

## 2024-05-13 NOTE — Progress Notes (Unsigned)
    Subjective:    Patient ID: Dawn Kim, female    DOB: September 17, 1946, 78 y.o.   MRN: [id number]      HPI Dawn Kim is here for No chief complaint on file.   Joint pain -      Medications and allergies reviewed with patient and updated if appropriate.  Current Outpatient Medications on File Prior to Visit  Medication Sig Dispense Refill   acetaminophen  (TYLENOL ) 325 MG tablet Take 650 mg by mouth every 6 (six) hours as needed for mild pain or moderate pain.      ALPRAZolam  (XANAX ) 0.5 MG tablet TAKE 1 TABLET BY MOUTH THREE TIMES DAILY AS NEEDED FOR ANXIETY . DO NOT EXCEED 3 PER 24 HOURS 90 tablet 0   calcium  carbonate (OS-CAL - DOSED IN MG OF ELEMENTAL CALCIUM ) 1250 (500 Ca) MG tablet Take 1 tablet (500 mg of elemental calcium  total) by mouth daily.     cholecalciferol  (VITAMIN D ) 1000 UNITS tablet Take 5,000 Units by mouth daily.     famotidine  (PEPCID ) 40 MG tablet Take 1 tablet (40 mg total) by mouth at bedtime. 90 tablet 0   ferrous sulfate  325 (65 FE) MG EC tablet Take 325 mg by mouth daily with breakfast.     fish oil-omega-3 fatty acids 1000 MG capsule Take 1 g by mouth 2 (two) times daily.      furosemide  (LASIX ) 20 MG tablet Take 1 tablet by mouth once daily 90 tablet 0   levocetirizine (XYZAL ) 5 MG tablet TAKE 1 TABLET BY MOUTH ONCE DAILY IN THE EVENING 90 tablet 0   levothyroxine  (SYNTHROID ) 125 MCG tablet TAKE 1 TABLET BY MOUTH ONCE DAILY FOR THYROID  90 tablet 0   Multiple Vitamin (MULTIVITAMIN) tablet Take 1 tablet by mouth daily.     omeprazole  (PRILOSEC) 40 MG capsule TAKE 1 CAPSULE BY MOUTH TWICE DAILY BEFORE A MEAL 180 capsule 0   potassium chloride  (KLOR-CON  M) 10 MEQ tablet Take 2 tablets (20 mEq total) by mouth daily. 180 tablet 1   sertraline  (ZOLOFT ) 50 MG tablet Take 1 tablet (50 mg total) by mouth daily. 30 tablet 5   simvastatin  (ZOCOR ) 40 MG tablet Take 1 tablet by mouth in the evening 90 tablet 0   traZODone  (DESYREL ) 100 MG tablet TAKE 1 TABLET BY MOUTH  AT BEDTIME 90 tablet 1   valsartan  (DIOVAN ) 80 MG tablet Take 1 tablet by mouth once daily for blood pressure 90 tablet 0   No current facility-administered medications on file prior to visit.    Review of Systems     Objective:  There were no vitals filed for this visit. BP Readings from Last 3 Encounters:  01/23/24 136/70  01/01/24 130/82  07/25/23 138/86   Wt Readings from Last 3 Encounters:  03/02/22 179 lb (81.2 kg)  09/01/21 178 lb (80.7 kg)  03/30/21 186 lb 6.4 oz (84.6 kg)   There is no height or weight on file to calculate BMI.    Physical Exam         Assessment & Plan:    See Problem List for Assessment and Plan of chronic medical problems.

## 2024-05-14 ENCOUNTER — Ambulatory Visit: Admitting: Internal Medicine

## 2024-05-14 ENCOUNTER — Ambulatory Visit: Admitting: Physician Assistant

## 2024-05-14 ENCOUNTER — Encounter: Payer: Self-pay | Admitting: Internal Medicine

## 2024-05-14 VITALS — BP 134/76 | HR 63 | Temp 98.0°F | Ht 63.0 in

## 2024-05-14 DIAGNOSIS — F32A Depression, unspecified: Secondary | ICD-10-CM

## 2024-05-14 DIAGNOSIS — E039 Hypothyroidism, unspecified: Secondary | ICD-10-CM | POA: Diagnosis not present

## 2024-05-14 DIAGNOSIS — G4709 Other insomnia: Secondary | ICD-10-CM

## 2024-05-14 DIAGNOSIS — M25562 Pain in left knee: Secondary | ICD-10-CM

## 2024-05-14 DIAGNOSIS — F419 Anxiety disorder, unspecified: Secondary | ICD-10-CM | POA: Diagnosis not present

## 2024-05-14 DIAGNOSIS — M25561 Pain in right knee: Secondary | ICD-10-CM | POA: Diagnosis not present

## 2024-05-14 DIAGNOSIS — K219 Gastro-esophageal reflux disease without esophagitis: Secondary | ICD-10-CM | POA: Diagnosis not present

## 2024-05-14 DIAGNOSIS — E7800 Pure hypercholesterolemia, unspecified: Secondary | ICD-10-CM

## 2024-05-14 DIAGNOSIS — M2550 Pain in unspecified joint: Secondary | ICD-10-CM | POA: Insufficient documentation

## 2024-05-14 DIAGNOSIS — M545 Low back pain, unspecified: Secondary | ICD-10-CM

## 2024-05-14 DIAGNOSIS — G6289 Other specified polyneuropathies: Secondary | ICD-10-CM

## 2024-05-14 DIAGNOSIS — R739 Hyperglycemia, unspecified: Secondary | ICD-10-CM

## 2024-05-14 DIAGNOSIS — R600 Localized edema: Secondary | ICD-10-CM

## 2024-05-14 DIAGNOSIS — G8929 Other chronic pain: Secondary | ICD-10-CM

## 2024-05-14 DIAGNOSIS — I10 Essential (primary) hypertension: Secondary | ICD-10-CM

## 2024-05-14 DIAGNOSIS — G629 Polyneuropathy, unspecified: Secondary | ICD-10-CM | POA: Insufficient documentation

## 2024-05-14 DIAGNOSIS — N1831 Chronic kidney disease, stage 3a: Secondary | ICD-10-CM

## 2024-05-14 DIAGNOSIS — Z9181 History of falling: Secondary | ICD-10-CM | POA: Insufficient documentation

## 2024-05-14 LAB — CBC WITH DIFFERENTIAL/PLATELET
Basophils Absolute: 0 K/uL (ref 0.0–0.1)
Basophils Relative: 0.3 % (ref 0.0–3.0)
Eosinophils Absolute: 0.1 K/uL (ref 0.0–0.7)
Eosinophils Relative: 2.6 % (ref 0.0–5.0)
HCT: 42.7 % (ref 36.0–46.0)
Hemoglobin: 13.8 g/dL (ref 12.0–15.0)
Lymphocytes Relative: 33.6 % (ref 12.0–46.0)
Lymphs Abs: 1.7 K/uL (ref 0.7–4.0)
MCHC: 32.4 g/dL (ref 30.0–36.0)
MCV: 93.6 fl (ref 78.0–100.0)
Monocytes Absolute: 0.4 K/uL (ref 0.1–1.0)
Monocytes Relative: 8.7 % (ref 3.0–12.0)
Neutro Abs: 2.8 K/uL (ref 1.4–7.7)
Neutrophils Relative %: 54.8 % (ref 43.0–77.0)
Platelets: 213 K/uL (ref 150.0–400.0)
RBC: 4.56 Mil/uL (ref 3.87–5.11)
RDW: 13.1 % (ref 11.5–15.5)
WBC: 5.1 K/uL (ref 4.0–10.5)

## 2024-05-14 LAB — C-REACTIVE PROTEIN: CRP: <1 mg/dL (ref 0.5–20.0)

## 2024-05-14 LAB — HEMOGLOBIN A1C: Hgb A1c MFr Bld: 5.4 % (ref 4.6–6.5)

## 2024-05-14 LAB — COMPREHENSIVE METABOLIC PANEL WITH GFR
ALT: 13 U/L (ref 0–35)
AST: 16 U/L (ref 0–37)
Albumin: 4.3 g/dL (ref 3.5–5.2)
Alkaline Phosphatase: 39 U/L (ref 39–117)
BUN: 17 mg/dL (ref 6–23)
CO2: 31 meq/L (ref 19–32)
Calcium: 9 mg/dL (ref 8.4–10.5)
Chloride: 103 meq/L (ref 96–112)
Creatinine, Ser: 0.86 mg/dL (ref 0.40–1.20)
GFR: 64 mL/min (ref >60.00)
Glucose, Bld: 86 mg/dL (ref 70–99)
Potassium: 4.4 meq/L (ref 3.5–5.1)
Sodium: 141 meq/L (ref 135–145)
Total Bilirubin: 0.7 mg/dL (ref 0.2–1.2)
Total Protein: 6.4 g/dL (ref 6.0–8.3)

## 2024-05-14 LAB — LIPID PANEL
Cholesterol: 136 mg/dL (ref 0–200)
HDL: 42.2 mg/dL (ref >39.00)
LDL Cholesterol: 65 mg/dL (ref 0–99)
NonHDL: 94.25
Total CHOL/HDL Ratio: 3
Triglycerides: 147 mg/dL (ref 0.0–149.0)
VLDL: 29.4 mg/dL (ref 0.0–40.0)

## 2024-05-14 LAB — SEDIMENTATION RATE: Sed Rate: 2 mm/h (ref 0–30)

## 2024-05-14 LAB — TSH: TSH: 0.3 u[IU]/mL — ABNORMAL LOW (ref 0.35–5.50)

## 2024-05-14 MED ORDER — LEVOCETIRIZINE DIHYDROCHLORIDE 5 MG PO TABS: 5.0000 mg | ORAL_TABLET | Freq: Every evening | ORAL | 1 refills | Status: AC

## 2024-05-14 NOTE — Assessment & Plan Note (Signed)
 Chronic Check a1c Low sugar / carb diet Stressed regular exercise

## 2024-05-14 NOTE — Assessment & Plan Note (Signed)
 Chronic BP controlled Continue diovan  80 mg daily Low sodium diet Cmp

## 2024-05-14 NOTE — Assessment & Plan Note (Signed)
 Chronic Deferred PT Would benefit from Rolator especially given history of falls

## 2024-05-14 NOTE — Assessment & Plan Note (Signed)
 Chronic Several medication have not been effective Continue  trazodone  100 mg nightly

## 2024-05-14 NOTE — Assessment & Plan Note (Signed)
 Chronic  Clinically euthyroid Check tsh and will titrate med dose if needed Currently taking levothyroxine  125 mcg daily

## 2024-05-14 NOTE — Assessment & Plan Note (Signed)
 Chronic  improved elevate legs as much as possible when sitting She is sedentary  - advised walking as much as possible  Low sodium diet Continue lasix  20 mg daily

## 2024-05-14 NOTE — Assessment & Plan Note (Signed)
 Having nerve pain in the feet - probable neuropathy This is likely affecting her balance Start using Rolator

## 2024-05-14 NOTE — Assessment & Plan Note (Signed)
Chronic °Check lipid panel  °Continue simvastatin 40 mg daily °

## 2024-05-14 NOTE — Assessment & Plan Note (Signed)
 Chronic History of several falls No recent falls Likely related to probable neuropathy, OA, deconditioning

## 2024-05-14 NOTE — Assessment & Plan Note (Signed)
Chronic CMP, CBC 

## 2024-05-14 NOTE — Assessment & Plan Note (Signed)
 Chronic GERD controlled Continue famotidine  40 mg daily, omeprazole  40 mg bid

## 2024-05-14 NOTE — Assessment & Plan Note (Signed)
 Chronic Not controlled Anxiety and depression  Controlled Continue sertraline  50 mg daily Continue alprazolam  0.5 mg tid -- would like to eventually decrease this dose and ideally get her off that medication

## 2024-05-14 NOTE — Assessment & Plan Note (Signed)
 Chronic lower back pain Likely increases risk of falls Would benefit from rolator

## 2024-05-14 NOTE — Assessment & Plan Note (Signed)
 Chronic She is concerned about possible of autoimmune disease Has known OA - may have an autoimmune disease as well so it is reasonable to check labs Ana, esr, crp, ccp, rf

## 2024-05-15 ENCOUNTER — Telehealth: Payer: Self-pay

## 2024-05-15 NOTE — Telephone Encounter (Signed)
 Copied from CRM [id number]. Topic: General - Other >> May 15, 2024  1:53 PM Robinson H wrote: Reason for CRM: Patient forgot to ask provider at visit on 8/11 if she needs a Covid booster based on her age she doesn't want to risk it.  [place] [phone]

## 2024-05-16 LAB — CYCLIC CITRUL PEPTIDE ANTIBODY, IGG: Cyclic Citrullin Peptide Ab: <16 U

## 2024-05-16 LAB — RHEUMATOID FACTOR: Rheumatoid fact SerPl-aCnc: <10 [IU]/mL (ref <14)

## 2024-05-16 LAB — ANA: Anti Nuclear Antibody (ANA): NEGATIVE

## 2024-05-16 NOTE — Telephone Encounter (Signed)
 Spoke with patient today.

## 2024-05-17 ENCOUNTER — Ambulatory Visit: Payer: Self-pay | Admitting: Internal Medicine

## 2024-05-17 ENCOUNTER — Telehealth: Payer: Self-pay

## 2024-05-17 MED ORDER — LEVOTHYROXINE SODIUM 112 MCG PO TABS: 112.0000 ug | ORAL_TABLET | Freq: Every day | ORAL | 3 refills | Status: AC

## 2024-05-17 NOTE — Telephone Encounter (Signed)
 Patient was informed that when labs were resulted I could call her on yesterday.

## 2024-05-17 NOTE — Telephone Encounter (Signed)
 Copied from CRM #[id number]. Topic: Clinical - Medication Question >> May 17, 2024  4:00 PM Dawn Kim wrote: Reason for CRM: Patient called in regarding thyroid  medication, patient stated that the pharmacy called and stated they are out of there of the medication, patient stated what should she do in the mean time while she waiting for it to be restock, would like a callback

## 2024-05-17 NOTE — Telephone Encounter (Signed)
 Copied from CRM [id number]. Topic: Clinical - Lab/Test Results >> May 17, 2024 10:17 AM Montie POUR wrote: Reason for CRM:  Please call Katelyne with her lab results at [phone]. Thanks

## 2024-05-19 ENCOUNTER — Other Ambulatory Visit: Payer: Self-pay | Admitting: Internal Medicine

## 2024-05-19 DIAGNOSIS — F419 Anxiety disorder, unspecified: Secondary | ICD-10-CM

## 2024-05-21 ENCOUNTER — Other Ambulatory Visit: Payer: Self-pay | Admitting: Internal Medicine

## 2024-05-21 DIAGNOSIS — F419 Anxiety disorder, unspecified: Secondary | ICD-10-CM

## 2024-05-21 NOTE — Telephone Encounter (Signed)
 Copied from CRM #[id number]. Topic: General - Other >> May 21, 2024  9:50 AM Vena HERO wrote: Reason for CRM: Pt called in to let Dr Geofm know that everything is fine with her medication and she received everything.

## 2024-05-21 NOTE — Telephone Encounter (Signed)
 Reason for CRM: Pt called in to let Dr Geofm know that everything is fine with her medication and she received everything.

## 2024-05-23 ENCOUNTER — Other Ambulatory Visit: Payer: Self-pay | Admitting: Internal Medicine

## 2024-05-28 ENCOUNTER — Ambulatory Visit: Admitting: Physician Assistant

## 2024-05-28 ENCOUNTER — Telehealth: Payer: Self-pay

## 2024-05-28 ENCOUNTER — Ambulatory Visit

## 2024-05-28 NOTE — Telephone Encounter (Signed)
 Spoke with patient today and [employer] has called her and she is taken care of.

## 2024-05-28 NOTE — Telephone Encounter (Signed)
 Copied from CRM #[id number]. Topic: General - Other >> May 28, 2024  9:34 AM Hamdi H wrote: Reason for CRM: Patient called in today requesting something to help her walk. She stated that she discussed this already with her provider at her last visit this month. Her provider told her that she will order this for her and deal with her medicare as well for this. Patient wanted to know the status on this and if someone can call her back to give her an update. [phone].

## 2024-05-28 NOTE — Telephone Encounter (Signed)
 Order sent to [facility].  They will contact patient once ran through insurance and processed.

## 2024-06-06 ENCOUNTER — Other Ambulatory Visit: Payer: Self-pay | Admitting: Internal Medicine

## 2024-06-07 ENCOUNTER — Telehealth: Payer: Self-pay

## 2024-06-07 NOTE — Telephone Encounter (Signed)
 Spoke with patient today.

## 2024-06-07 NOTE — Telephone Encounter (Signed)
 Copied from CRM [id number]. Topic: General - Other >> Jun 07, 2024 10:12 AM Dawn Kim wrote: Reason for CRM: Patient wants to schedule a covid vaccine, and wants a return phone call.

## 2024-06-08 ENCOUNTER — Other Ambulatory Visit: Payer: Self-pay | Admitting: Internal Medicine

## 2024-06-13 ENCOUNTER — Ambulatory Visit: Payer: Self-pay

## 2024-06-13 NOTE — Telephone Encounter (Signed)
 FYI Only or Action Required?: FYI only for provider.  Patient was last seen in primary care on 05/14/2024 by Geofm Glade PARAS, MD.  Called Nurse Triage reporting Nasal Congestion.  Symptoms began about a month ago.  Interventions attempted: Nothing.  Symptoms are: unchanged.  Triage Disposition: Home Care  Patient/caregiver understands and will follow disposition?: Yes   Copied from CRM [id number]. Topic: Clinical - Medication Question >> Jun 13, 2024  8:42 AM Suzen RAMAN wrote: Reason for CRM: patient experience congestion denies other symptoms and would like to know if there is something she can take OTC or can she be prescribed a medication for congestion.  [phone] Reason for Disposition  [1] Nasal allergies AND [2] only certain times of year (hay fever) AND [3] hay fever diagnosis has been confirmed by a doctor (or NP/PA)  Answer Assessment - Initial Assessment Questions 1. SYMPTOM: What's the main symptom you're concerned about? (e.g., runny nose, stuffiness, sneezing, itching)     Runny nose, drainage/cough 2. SEVERITY: How bad is it? What does it keep you from doing? (e.g., sleeping, working)      worsening 3. EYES: Are your eyes also red, watery, and itchy?      resolved 4. TRIGGER: What pollen or other allergic substance do you think is causing the symptoms?      unknown 5. TREATMENT: What medicine are you using? What medicine worked best in the past?     nothing 6. OTHER SYMPTOMS: Do you have any other symptoms? (e.g., coughing, difficulty breathing, wheezing)     Post nasal drip cough 7. PREGNANCY: Is there any chance you are pregnant? When was your last menstrual period?     N/a  Answer Assessment - Initial Assessment Questions 1. REASON FOR CALL: What is the main reason for your call? or How can I best help you?     Patient asked for recent thyroid  results.  Results given per Dr. Geofm, patient verbalizes understanding: Thyroid  dose  is slightly high so we need to get decreased thyroid  medication dose to 112 mcg daily.  New prescription sent to pharmacy.  We can wait and recheck her thyroid  at her next visit.  Protocols used: Nasal Allergies (Hay Fever)-A-AH, Information Only Call - No Triage-A-AH

## 2024-06-14 ENCOUNTER — Other Ambulatory Visit: Payer: Self-pay

## 2024-06-14 MED ORDER — COVID-19 MRNA VAC-TRIS(PFIZER) 30 MCG/0.3ML IM SUSY: 0.3000 mL | PREFILLED_SYRINGE | Freq: Once | INTRAMUSCULAR | 0 refills | Status: AC

## 2024-06-14 NOTE — Telephone Encounter (Unsigned)
 Copied from CRM #[id number]. Topic: Clinical - Medication Question >> Jun 14, 2024  2:55 PM Thersia BROCKS wrote: Reason for CRM: Patient called in regarding Covid prescription to be sent to   [facility] [address] [place] - [address] [place] [address] Phone: [phone] Fax: [phone]

## 2024-06-14 NOTE — Telephone Encounter (Signed)
Spoke with patient today and all questions answered.  

## 2024-06-14 NOTE — Telephone Encounter (Signed)
 Vaccine ordered.

## 2024-06-18 ENCOUNTER — Other Ambulatory Visit: Payer: Self-pay | Admitting: Internal Medicine

## 2024-06-18 DIAGNOSIS — F419 Anxiety disorder, unspecified: Secondary | ICD-10-CM

## 2024-06-19 ENCOUNTER — Other Ambulatory Visit: Payer: Self-pay | Admitting: Internal Medicine

## 2024-06-19 DIAGNOSIS — F419 Anxiety disorder, unspecified: Secondary | ICD-10-CM

## 2024-06-19 NOTE — Telephone Encounter (Signed)
 Copied from CRM [id number]. Topic: Clinical - Medication Refill >> Jun 19, 2024  9:54 AM Tinnie C wrote: Medication: ALPRAZolam  (XANAX ) 0.5 MG tablet   Has the patient contacted their pharmacy? Yes (Agent: If no, request that the patient contact the pharmacy for the refill. If patient does not wish to contact the pharmacy document the reason why and proceed with request.) (Agent: If yes, when and what did the pharmacy advise?)  No refills.  This is the patient's preferred pharmacy:  [facility] [address], [place] - [address] [place] [address] Phone: [phone] Fax: [phone]  Is this the correct pharmacy for this prescription? Yes If no, delete pharmacy and type the correct one.   Has the prescription been filled recently? Yes  Is the patient out of the medication? No, will be on 19th  Has the patient been seen for an appointment in the last year OR does the patient have an upcoming appointment? Yes  Can we respond through MyChart? No  Agent: Please be advised that Rx refills may take up to 3 business days. We ask that you follow-up with your pharmacy.

## 2024-07-02 ENCOUNTER — Telehealth: Payer: Self-pay | Admitting: Internal Medicine

## 2024-07-02 NOTE — Telephone Encounter (Unsigned)
 Copied from CRM [id number]. Topic: Clinical - Medication Question >> Jul 02, 2024  4:19 PM Wess RAMAN wrote: Reason for CRM: Patient received letter from [facility] stating individuals 34 or older will experience side effects from ALPRAZolam  (XANAX ) 0.5 MG tablet. She would like a call to discuss this matter.  Callback #: [phone]

## 2024-07-03 NOTE — Telephone Encounter (Signed)
Spoke with patient and all questions answered.

## 2024-07-05 ENCOUNTER — Other Ambulatory Visit: Payer: Self-pay | Admitting: Internal Medicine

## 2024-07-13 ENCOUNTER — Other Ambulatory Visit: Payer: Self-pay | Admitting: Internal Medicine

## 2024-07-13 DIAGNOSIS — F419 Anxiety disorder, unspecified: Secondary | ICD-10-CM

## 2024-07-24 ENCOUNTER — Ambulatory Visit: Payer: Self-pay

## 2024-07-24 NOTE — Telephone Encounter (Signed)
 FYI Only or Action Required?: Action required by provider: clinical question for provider.  Patient was last seen in primary care on 05/14/2024 by Geofm Glade PARAS, MD.  Called Nurse Triage reporting Depression.  Symptoms began several weeks ago.  Interventions attempted: Nothing.  Symptoms are: unchanged.Pt. States more depressed, lives alone, no family. Asking for medication to be changed or adjusted. Declines OV due to transportation issues.  Triage Disposition: See PCP Within 2 Weeks  Patient/caregiver understands and will follow disposition?: No, refuses disposition      Copied from CRM #[id number]. Topic: Clinical - Red Word Triage >> Jul 24, 2024 10:53 AM Alfonso ORN wrote: Red Word that prompted transfer to Nurse Triage: pt requesting increase in depression medication because of increased depression symptoms Reason for Disposition  [1] Started on anti-depressant medication < 2 weeks ago AND [2] not feeling any better  Answer Assessment - Initial Assessment Questions 1. CONCERN: What happened that made you call today?     depression 2. DEPRESSION SYMPTOM SCREENING: How are you feeling overall? (e.g., decreased energy, increased sleeping or difficulty sleeping, difficulty concentrating, feelings of sadness, guilt, hopelessness, or worthlessness)     sad 3. RISK OF HARM - SUICIDAL IDEATION:  Do you ever have thoughts of hurting or killing yourself?  (e.g., yes, no, no but preoccupation with thoughts about death)     no 4. RISK OF HARM - HOMICIDAL IDEATION:  Do you ever have thoughts of hurting or killing someone else?  (e.g., yes, no, no but preoccupation with thoughts about death)     no 5. FUNCTIONAL IMPAIRMENT: How have things been going for you overall? Have you had more difficulty than usual doing your normal daily activities?  (e.g., better, same, worse; self-care, school, work, interactions)     worse 6. SUPPORT: Who is with you now? Who do you live with? Do you  have family or friends who you can talk to?      Friend 7. THERAPIST: Do you have a counselor or therapist? If Yes, ask: What is their name?     no 8. STRESSORS: Has there been any new stress or recent changes in your life?     no 9. ALCOHOL  USE OR SUBSTANCE USE (DRUG USE): Do you drink alcohol  or use any illegal drugs?     no 10. OTHER: Do you have any other physical symptoms right now? (e.g., fever)       no 11. PREGNANCY: Is there any chance you are pregnant? When was your last menstrual period?       no  Protocols used: Depression-A-AH

## 2024-07-25 ENCOUNTER — Ambulatory Visit: Payer: Self-pay

## 2024-07-25 ENCOUNTER — Telehealth: Payer: Self-pay

## 2024-07-25 ENCOUNTER — Other Ambulatory Visit: Payer: Self-pay | Admitting: Internal Medicine

## 2024-07-25 MED ORDER — SERTRALINE HCL 100 MG PO TABS: 100.0000 mg | ORAL_TABLET | Freq: Every day | ORAL | 1 refills | Status: AC

## 2024-07-25 NOTE — Telephone Encounter (Signed)
 FYI Only or Action Required?: Action required by provider: clinical question for provider.  Patient was last seen in primary care on 05/14/2024 by Geofm Glade PARAS, MD.  Called Nurse Triage reporting Medication Consultation.  Symptoms began a week ago.  Interventions attempted: Prescription medications: sertraline .  Symptoms are: unchanged.  Triage Disposition: See Physician Within 24 Hours  Patient/caregiver understands and will follow disposition?: No, wishes to speak with PCP   Copied from CRM #[id number]. Topic: Clinical - Red Word Triage >> Jul 25, 2024 10:52 AM Ashley SAUNDERS wrote: Red Word that prompted transfer to Nurse Triage: Depression. Triaged yesterday. Can not travel to be seen. Reason for Disposition  [1] Depression AND [2] getting worse (e.g., sleeping poorly, less able to do activities of daily living)  Answer Assessment - Initial Assessment Questions Additional info: 1) Patient is requesting for Dr. Geofm to increase her sertraline  dose. She is maintained on 50mg  and feels this is losing its effect, feels she may need a higher dose. She also has xanax  prn but states she only uses those when she is very upset. Offered appointment but due to travel issues she declines at this time. States if medication cannot be increased without a visit, it is ok, she will just wait until next appointment.     1. CONCERN: What happened that made you call today?     A lot on her mind 2. DEPRESSION SYMPTOM SCREENING: How are you feeling overall? (e.g., decreased energy, increased sleeping or difficulty sleeping, difficulty concentrating, feelings of sadness, guilt, hopelessness, or worthlessness)     A lot on her mind recently 3. RISK OF HARM - SUICIDAL IDEATION:  Do you ever have thoughts of hurting or killing yourself?  (e.g., yes, no, no but preoccupation with thoughts about death)     Denies  4. RISK OF HARM - HOMICIDAL IDEATION:  Do you ever have thoughts of hurting or killing  someone else?  (e.g., yes, no, no but preoccupation with thoughts about death)     Denies  5. FUNCTIONAL IMPAIRMENT: How have things been going for you overall? Have you had more difficulty than usual doing your normal daily activities?  (e.g., better, same, worse; self-care, school, work, interactions)     More difficulty but daughter helps  6. SUPPORT: Who is with you now? Who do you live with? Do you have family or friends who you can talk to?      Daughter 7. THERAPIST: Do you have a counselor or therapist? If Yes, ask: What is their name?     f 8. STRESSORS: Has there been any new stress or recent changes in your life?     Increased worry  9. ALCOHOL  USE OR SUBSTANCE USE (DRUG USE): Do you drink alcohol  or use any illegal drugs?      10. OTHER: Do you have any other physical symptoms right now? (e.g., fever)  Denies all other symptoms  Protocols used: Depression-A-AH

## 2024-07-25 NOTE — Telephone Encounter (Signed)
 Previous message sent from yesterday.

## 2024-07-25 NOTE — Telephone Encounter (Signed)
 See other phone note

## 2024-07-25 NOTE — Telephone Encounter (Signed)
 Copied from CRM [id number]. Topic: Clinical - Prescription Issue >> Jul 25, 2024 10:50 AM Ashley R wrote: Reason for CRM: Request dosage change on sertraline  (ZOLOFT ) 50 MG tablet. Unable to be seen as she does not drive.

## 2024-07-25 NOTE — Addendum Note (Signed)
 Addended by: GEOFM GLADE PARAS on: 07/25/2024 11:37 AM   Modules accepted: Orders

## 2024-07-25 NOTE — Telephone Encounter (Signed)
 If she agrees lets increase the sertraline   100 mg daily-hopefully this will help.  In the past we have talked about her seeing a psychiatrist to see if getting her on different medications would better help her anxiety and depression.  She should think about this.  She should come to see me in a month to reevaluate, sooner if needed.  New prescription for sertraline  sent for 100 mg daily

## 2024-07-26 ENCOUNTER — Ambulatory Visit: Admitting: Physician Assistant

## 2024-07-26 ENCOUNTER — Ambulatory Visit

## 2024-07-26 NOTE — Telephone Encounter (Signed)
 Spoke with patient today and information.  She will have daughter call back to schedule appointment.

## 2024-07-30 ENCOUNTER — Encounter: Admitting: Internal Medicine

## 2024-08-06 ENCOUNTER — Telehealth: Payer: Self-pay

## 2024-08-06 ENCOUNTER — Ambulatory Visit

## 2024-08-06 VITALS — Ht 62.0 in | Wt 179.0 lb

## 2024-08-06 DIAGNOSIS — Z0000 Encounter for general adult medical examination without abnormal findings: Secondary | ICD-10-CM

## 2024-08-06 DIAGNOSIS — G4709 Other insomnia: Secondary | ICD-10-CM

## 2024-08-06 NOTE — Progress Notes (Signed)
 Subjective:   Dawn Kim is a 78 y.o. female who presents for a Medicare Annual Wellness Visit.  Allergies (verified) Penicillins, Eszopiclone , Gabapentin , Sonata  [zaleplon ], Ambien  [zolpidem ], and Amoxicillin    History: Past Medical History:  Diagnosis Date   Anxiety    Cataract    Bil   Depression    Hypercholesteremia    Hypertension    Thyroid  disease    Past Surgical History:  Procedure Laterality Date   CHOLECYSTECTOMY     COLONOSCOPY     FOOT SURGERY  2011   right foot   JOINT REPLACEMENT  2010   left knee   POLYPECTOMY     REPLACEMENT TOTAL KNEE Left    TONSILLECTOMY     Family History  Problem Relation Age of Onset   Heart disease Mother    Drug abuse Sister    Social History   Occupational History   Occupation: Retired   Tobacco Use   Smoking status: Former    Current packs/day: 0.00    Types: Cigarettes    Quit date: 11/04/2009    Years since quitting: 14.7   Smokeless tobacco: Never  Vaping Use   Vaping status: Never Used  Substance and Sexual Activity   Alcohol  use: No   Drug use: No   Sexual activity: Not Currently   Tobacco Counseling Counseling given: Not Answered  SDOH Screenings   Food Insecurity: No Food Insecurity (08/06/2024)  Housing: Unknown (08/06/2024)  Transportation Needs: No Transportation Needs (08/06/2024)  Utilities: Not At Risk (08/06/2024)  Depression (PHQ2-9): Medium Risk (08/06/2024)  Financial Resource Strain: Low Risk  (02/21/2024)  Physical Activity: Inactive (08/06/2024)  Social Connections: Socially Isolated (08/06/2024)  Stress: No Stress Concern Present (08/06/2024)  Tobacco Use: Medium Risk (08/06/2024)  Health Literacy: Adequate Health Literacy (08/06/2024)   Depression Screen    08/06/2024    1:46 PM 05/14/2024    1:56 PM 11/19/2022    3:47 PM 03/24/2021    3:00 PM 03/16/2021    1:25 PM 07/11/2020    3:14 PM 12/12/2019   10:41 AM  PHQ 2/9 Scores  PHQ - 2 Score 0 0 0 0 0 0   PHQ- 9 Score 7 2 3        Exception Documentation       Patient refusal     Goals Addressed               This Visit's Progress     Patient Stated (pt-stated)        Patient stated she plans to continue living       Visit info / Clinical Intake: Medicare Wellness Visit Type:: Subsequent Annual Wellness Visit Medicare Wellness Visit Mode:: Telephone If telephone:: video declined If telephone or video:: unable to obtan vitals due to lack of equipment Interpreter Needed?: No Pre-visit prep was completed: yes AWV questionnaire completed by patient prior to visit?: no Living arrangements:: (!) lives alone Patient's Overall Health Status Rating: (!) fair Typical amount of pain: none Does pain affect daily life?: no Are you currently prescribed opioids?: no  Dietary Habits and Nutritional Risks How many meals a day?: 3 Eats fruit and vegetables daily?: (!) no Most meals are obtained by: preparing own meals In the last 2 weeks, have you had any of the following?: -- (none) Diabetic:: no  Functional Status Activities of Daily Living (to include ambulation/medication): Independent Ambulation: Independent with device- listed below Home Assistive Devices/Equipment: Eyeglasses; Dentures (specify type); Walker (specify Type); Cane Medication Administration: Independent  Home Management: Independent Manage your own finances?: yes Primary transportation is: family/friends Concerns about vision?: no *vision screening is required for WTM* Concerns about hearing?: no  Fall Screening Falls in the past year?: 0 Number of falls in past year: 0 Was there an injury with Fall?: 0 Fall Risk Category Calculator: 0 Patient Fall Risk Level: Low Fall Risk  Fall Risk Patient at Risk for Falls Due to: History of fall(s); Impaired balance/gait Fall risk Follow up: Falls evaluation completed; Falls prevention discussed  Home and Transportation Safety: All rugs have non-skid backing?: N/A, no rugs All stairs or  steps have railings?: N/A, no stairs Grab bars in the bathtub or shower?: yes Have non-skid surface in bathtub or shower?: yes Good home lighting?: yes Regular seat belt use?: yes Hospital stays in the last year:: no  Cognitive Assessment Difficulty concentrating, remembering, or making decisions? : no Will 6CIT or Mini Cog be Completed: yes What year is it?: 0 points What month is it?: 0 points Give patient an address phrase to remember (5 components): [address] [place], [place] About what time is it?: 0 points Count backwards from 20 to 1: 0 points Say the months of the year in reverse: 0 points Repeat the address phrase from earlier: 0 points 6 CIT Score: 0 points  Advance Directives (For Healthcare) Does Patient Have a Medical Advance Directive?: No Would patient like information on creating a medical advance directive?: No - Guardian declined  Reviewed/Updated  Reviewed/Updated: Medical History; Surgical History; Family History; Medications; Allergies; Care Teams; Patient Goals        Objective:    Today's Vitals   08/06/24 1337  Weight: 179 lb (81.2 kg)  Height: 5' 2 (1.575 m)   Body mass index is 32.74 kg/m.  Current Medications (verified) Outpatient Encounter Medications as of 08/06/2024  Medication Sig   acetaminophen  (TYLENOL ) 325 MG tablet Take 650 mg by mouth every 6 (six) hours as needed for mild pain or moderate pain.    ALPRAZolam  (XANAX ) 0.5 MG tablet TAKE 1 TABLET BY MOUTH THREE TIMES DAILY AS NEEDED FOR ANXIETY . DO NOT EXCEED 3 PER 24 HOURS   calcium  carbonate (OS-CAL - DOSED IN MG OF ELEMENTAL CALCIUM ) 1250 (500 Ca) MG tablet Take 1 tablet (500 mg of elemental calcium  total) by mouth daily.   cholecalciferol  (VITAMIN D ) 1000 UNITS tablet Take 5,000 Units by mouth daily.   famotidine  (PEPCID ) 40 MG tablet Take 1 tablet (40 mg total) by mouth at bedtime.   ferrous sulfate  325 (65 FE) MG EC tablet Take 325 mg by mouth daily with breakfast.   fish  oil-omega-3 fatty acids 1000 MG capsule Take 1 g by mouth 2 (two) times daily.    furosemide  (LASIX ) 20 MG tablet Take 1 tablet by mouth once daily   levocetirizine (XYZAL ) 5 MG tablet Take 1 tablet (5 mg total) by mouth every evening.   levothyroxine  (SYNTHROID ) 112 MCG tablet Take 1 tablet (112 mcg total) by mouth daily.   Multiple Vitamin (MULTIVITAMIN) tablet Take 1 tablet by mouth daily.   omeprazole  (PRILOSEC) 40 MG capsule TAKE 1 CAPSULE BY MOUTH TWICE DAILY BEFORE A MEAL   potassium chloride  (KLOR-CON  M) 10 MEQ tablet Take 2 tablets (20 mEq total) by mouth daily.   sertraline  (ZOLOFT ) 100 MG tablet Take 1 tablet (100 mg total) by mouth daily.   simvastatin  (ZOCOR ) 40 MG tablet Take 1 tablet by mouth in the evening   traZODone  (DESYREL ) 100 MG tablet TAKE  1 TABLET BY MOUTH AT BEDTIME   valsartan  (DIOVAN ) 80 MG tablet Take 1 tablet by mouth once daily for blood pressure   No facility-administered encounter medications on file as of 08/06/2024.   Hearing/Vision screen Hearing Screening - Comments:: Denies hearing difficulties   Vision Screening - Comments:: Wears rx glasses - up to date with routine eye exams  Immunizations and Health Maintenance Health Maintenance  Topic Date Due   Influenza Vaccine  05/04/2024   COVID-19 Vaccine (6 - 2025-26 season) 06/04/2024   DEXA SCAN  01/22/2025 (Originally 08/09/2020)   Medicare Annual Wellness (AWV)  08/06/2025   DTaP/Tdap/Td (2 - Td or Tdap) 07/31/2028   Pneumococcal Vaccine: 50+ Years  Completed   Zoster Vaccines- Shingrix  Completed   Meningococcal B Vaccine  Aged Out   Mammogram  Discontinued   Hepatitis C Screening  Discontinued   Fecal DNA (Cologuard)  Discontinued        Assessment/Plan:  This is a routine wellness examination for Mackinzie.  Patient Care Team: Geofm Glade PARAS, MD as PCP - General (Internal Medicine)  I have personally reviewed and noted the following in the patient's chart:   Medical and social history Use  of alcohol , tobacco or illicit drugs  Current medications and supplements including opioid prescriptions. Functional ability and status Nutritional status Physical activity Advanced directives List of other physicians Hospitalizations, surgeries, and ER visits in previous 12 months Vitals Screenings to include cognitive, depression, and falls Referrals and appointments  No orders of the defined types were placed in this encounter.  In addition, I have reviewed and discussed with patient certain preventive protocols, quality metrics, and best practice recommendations. A written personalized care plan for preventive services as well as general preventive health recommendations were provided to patient.   Verdie CHRISTELLA Saba, CMA   08/06/2024   Return in 1 year (on 08/06/2025).  After Visit Summary: (Declined) Due to this being a telephonic visit, with patients personalized plan was offered to patient but patient Declined AVS at this time   Nurse Notes: Scheduled a 2026 AWV/CPE appt.

## 2024-08-06 NOTE — Telephone Encounter (Signed)
 requesting a return phonecall by nurse to discuss worsening insomnia and current medication

## 2024-08-06 NOTE — Patient Instructions (Addendum)
 Dawn Kim,  Thank you for taking the time for your Medicare Wellness Visit. I appreciate your continued commitment to your health goals. Please review the care plan we discussed, and feel free to reach out if I can assist you further.  Please note that Annual Wellness Visits do not include a physical exam. Some assessments may be limited, especially if the visit was conducted virtually. If needed, we may recommend an in-person follow-up with your provider.  Ongoing Care Seeing your primary care provider every 3 to 6 months helps us  monitor your health and provide consistent, personalized care.  Referrals If a referral was made during today's visit and you haven't received any updates within two weeks, please contact the referred provider directly to check on the status.  Recommended Screenings:  Health Maintenance  Topic Date Due   Flu Shot  05/04/2024   COVID-19 Vaccine (6 - 2025-26 season) 06/04/2024   DEXA scan (bone density measurement)  01/22/2025*   Medicare Annual Wellness Visit  08/06/2025   DTaP/Tdap/Td vaccine (2 - Td or Tdap) 07/31/2028   Pneumococcal Vaccine for age over 26  Completed   Zoster (Shingles) Vaccine  Completed   Meningitis B Vaccine  Aged Out   Breast Cancer Screening  Discontinued   Hepatitis C Screening  Discontinued   Cologuard (Stool DNA test)  Discontinued  *Topic was postponed. The date shown is not the original due date.       08/06/2024    1:40 PM  Advanced Directives  Does Patient Have a Medical Advance Directive? No  Would patient like information on creating a medical advance directive? No - Guardian declined    Vision: Annual vision screenings are recommended for early detection of glaucoma, cataracts, and diabetic retinopathy. These exams can also reveal signs of chronic conditions such as diabetes and high blood pressure.  Dental: Annual dental screenings help detect early signs of oral cancer, gum disease, and other conditions linked to  overall health, including heart disease and diabetes.

## 2024-08-07 ENCOUNTER — Telehealth: Payer: Self-pay

## 2024-08-07 ENCOUNTER — Other Ambulatory Visit (HOSPITAL_COMMUNITY): Payer: Self-pay

## 2024-08-07 DIAGNOSIS — F419 Anxiety disorder, unspecified: Secondary | ICD-10-CM

## 2024-08-07 DIAGNOSIS — G4709 Other insomnia: Secondary | ICD-10-CM

## 2024-08-07 MED ORDER — DOXEPIN HCL 6 MG PO TABS: 6.0000 mg | ORAL_TABLET | Freq: Every day | ORAL | 5 refills | Status: DC

## 2024-08-07 NOTE — Telephone Encounter (Signed)
 Pharmacy Patient Advocate Encounter   Received notification from CoverMyMeds that prior authorization for Doxepin HCl 6MG  tablets  is required/requested.   Insurance verification completed.   The patient is insured through [employer].   Per test claim: PA required; PA submitted to above mentioned insurance via Latent Key/confirmation #/EOC [id number] Status is pending

## 2024-08-07 NOTE — Telephone Encounter (Signed)
 We have tried several medications in the past that have not been effective.  Stop trazodone   We will try doxepin 6 mg nightly.  I will send a prescription to her pharmacy.  We will see if it is covered and see if it is effective.

## 2024-08-07 NOTE — Addendum Note (Signed)
 Addended by: GEOFM GLADE PARAS on: 08/07/2024 11:42 AM   Modules accepted: Orders

## 2024-08-07 NOTE — Telephone Encounter (Signed)
 Copied from CRM #[id number]. Topic: Clinical - Prescription Issue >> Aug 07, 2024  4:11 PM Sasha M wrote: Reason for CRM: Pt called to let us  know that her pharmacy told her to call us  because her insurance is requiring a PA for medication. I explained to her that we did receive that information and Dr Geofm has to fill out that paperwork to send to the insurance company. She is concerned for the length of time it may take (she thought it would be done this week) because she needs the medication and wants to ensure that Dr Geofm is aware of what is going on.

## 2024-08-07 NOTE — Progress Notes (Signed)
 Subjective:   Dawn Kim is a 78 y.o. female who presents for a Medicare Annual Wellness Visit.  Visit Complete: Virtual I connected with on by an audio enabled telemedicine application and verified that I am speaking with the correct person using two identifiers.   Patient Location: Home   Provider Location: Office/Clinic   I discussed the limitations of evaluation and management by telemedicine. The patient expressed understanding and agreed to proceed.   Vital Signs: Because this visit was a virtual/telehealth visit, some criteria may be missing or patient reported. Any vitals not documented were not able to be obtained and vitals that have been documented are patient reported.   VideoDeclined- This patient declined Librarian, academic. Therefore the visit was completed with audio only.   Persons Participating in Visit: Patient.   Allergies (verified) Penicillins, Eszopiclone , Gabapentin , Sonata  [zaleplon ], Ambien  [zolpidem ], and Amoxicillin    History: Past Medical History:  Diagnosis Date   Anxiety    Cataract    Bil   Depression    Hypercholesteremia    Hypertension    Thyroid  disease    Past Surgical History:  Procedure Laterality Date   CHOLECYSTECTOMY     COLONOSCOPY     FOOT SURGERY  2011   right foot   JOINT REPLACEMENT  2010   left knee   POLYPECTOMY     REPLACEMENT TOTAL KNEE Left    TONSILLECTOMY     Family History  Problem Relation Age of Onset   Heart disease Mother    Drug abuse Sister    Social History   Occupational History   Occupation: Retired   Tobacco Use   Smoking status: Former    Current packs/day: 0.00    Types: Cigarettes    Quit date: 11/04/2009    Years since quitting: 14.7   Smokeless tobacco: Never  Vaping Use   Vaping status: Never Used  Substance and Sexual Activity   Alcohol  use: No   Drug use: No   Sexual activity: Not Currently   Tobacco Counseling Counseling given: Not  Answered  SDOH Screenings   Food Insecurity: No Food Insecurity (08/06/2024)  Housing: Unknown (08/06/2024)  Transportation Needs: No Transportation Needs (08/06/2024)  Utilities: Not At Risk (08/06/2024)  Depression (PHQ2-9): Medium Risk (08/06/2024)  Financial Resource Strain: Low Risk  (02/21/2024)  Physical Activity: Inactive (08/06/2024)  Social Connections: Socially Isolated (08/06/2024)  Stress: No Stress Concern Present (08/06/2024)  Tobacco Use: Medium Risk (08/06/2024)  Health Literacy: Adequate Health Literacy (08/06/2024)   Depression Screen    08/06/2024    1:46 PM 05/14/2024    1:56 PM 11/19/2022    3:47 PM 03/24/2021    3:00 PM 03/16/2021    1:25 PM 07/11/2020    3:14 PM 12/12/2019   10:41 AM  PHQ 2/9 Scores  PHQ - 2 Score 0 0 0 0 0 0   PHQ- 9 Score 7 2 3       Exception Documentation       Patient refusal     Goals Addressed               This Visit's Progress     Patient Stated (pt-stated)        Patient stated she plans to continue living       Visit info / Clinical Intake: Medicare Wellness Visit Type:: Subsequent Annual Wellness Visit Medicare Wellness Visit Mode:: Telephone If telephone:: video declined If telephone or video:: unable to obtan vitals due to lack  of equipment Interpreter Needed?: No Pre-visit prep was completed: yes AWV questionnaire completed by patient prior to visit?: no Living arrangements:: (!) lives alone Patient's Overall Health Status Rating: (!) fair Typical amount of pain: none Does pain affect daily life?: no Are you currently prescribed opioids?: no  Dietary Habits and Nutritional Risks How many meals a day?: 3 Eats fruit and vegetables daily?: (!) no Most meals are obtained by: preparing own meals In the last 2 weeks, have you had any of the following?: -- (none) Diabetic:: no  Functional Status Activities of Daily Living (to include ambulation/medication): Independent Ambulation: Independent with device- listed  below Home Assistive Devices/Equipment: Eyeglasses; Dentures (specify type); Walker (specify Type); Cane Medication Administration: Independent Home Management: Independent Manage your own finances?: yes Primary transportation is: family/friends Concerns about vision?: no *vision screening is required for WTM* Concerns about hearing?: no  Fall Screening Falls in the past year?: 0 Number of falls in past year: 0 Was there an injury with Fall?: 0 Fall Risk Category Calculator: 0 Patient Fall Risk Level: Low Fall Risk  Fall Risk Patient at Risk for Falls Due to: History of fall(s); Impaired balance/gait Fall risk Follow up: Falls evaluation completed; Falls prevention discussed  Home and Transportation Safety: All rugs have non-skid backing?: N/A, no rugs All stairs or steps have railings?: N/A, no stairs Grab bars in the bathtub or shower?: yes Have non-skid surface in bathtub or shower?: yes Good home lighting?: yes Regular seat belt use?: yes Hospital stays in the last year:: no  Cognitive Assessment Difficulty concentrating, remembering, or making decisions? : no Will 6CIT or Mini Cog be Completed: yes What year is it?: 0 points What month is it?: 0 points Give patient an address phrase to remember (5 components): [address] [place], [place] About what time is it?: 0 points Count backwards from 20 to 1: 0 points Say the months of the year in reverse: 0 points Repeat the address phrase from earlier: 0 points 6 CIT Score: 0 points  Advance Directives (For Healthcare) Does Patient Have a Medical Advance Directive?: No Would patient like information on creating a medical advance directive?: No - Guardian declined  Reviewed/Updated  Reviewed/Updated: Medical History; Surgical History; Family History; Medications; Allergies; Care Teams; Patient Goals        Objective:    Today's Vitals   08/06/24 1337  Weight: 179 lb (81.2 kg)  Height: 5' 2 (1.575 m)   Body mass  index is 32.74 kg/m.  Current Medications (verified) Outpatient Encounter Medications as of 08/06/2024  Medication Sig   acetaminophen  (TYLENOL ) 325 MG tablet Take 650 mg by mouth every 6 (six) hours as needed for mild pain or moderate pain.    ALPRAZolam  (XANAX ) 0.5 MG tablet TAKE 1 TABLET BY MOUTH THREE TIMES DAILY AS NEEDED FOR ANXIETY . DO NOT EXCEED 3 PER 24 HOURS   calcium  carbonate (OS-CAL - DOSED IN MG OF ELEMENTAL CALCIUM ) 1250 (500 Ca) MG tablet Take 1 tablet (500 mg of elemental calcium  total) by mouth daily.   cholecalciferol  (VITAMIN D ) 1000 UNITS tablet Take 5,000 Units by mouth daily.   famotidine  (PEPCID ) 40 MG tablet Take 1 tablet (40 mg total) by mouth at bedtime.   ferrous sulfate  325 (65 FE) MG EC tablet Take 325 mg by mouth daily with breakfast.   fish oil-omega-3 fatty acids 1000 MG capsule Take 1 g by mouth 2 (two) times daily.    furosemide  (LASIX ) 20 MG tablet Take 1 tablet by  mouth once daily   levocetirizine (XYZAL ) 5 MG tablet Take 1 tablet (5 mg total) by mouth every evening.   levothyroxine  (SYNTHROID ) 112 MCG tablet Take 1 tablet (112 mcg total) by mouth daily.   Multiple Vitamin (MULTIVITAMIN) tablet Take 1 tablet by mouth daily.   omeprazole  (PRILOSEC) 40 MG capsule TAKE 1 CAPSULE BY MOUTH TWICE DAILY BEFORE A MEAL   potassium chloride  (KLOR-CON  M) 10 MEQ tablet Take 2 tablets (20 mEq total) by mouth daily.   sertraline  (ZOLOFT ) 100 MG tablet Take 1 tablet (100 mg total) by mouth daily.   simvastatin  (ZOCOR ) 40 MG tablet Take 1 tablet by mouth in the evening   traZODone  (DESYREL ) 100 MG tablet TAKE 1 TABLET BY MOUTH AT BEDTIME   valsartan  (DIOVAN ) 80 MG tablet Take 1 tablet by mouth once daily for blood pressure   No facility-administered encounter medications on file as of 08/06/2024.   Hearing/Vision screen Hearing Screening - Comments:: Denies hearing difficulties   Vision Screening - Comments:: Wears rx glasses - up to date with routine eye exams   Immunizations and Health Maintenance Health Maintenance  Topic Date Due   Influenza Vaccine  05/04/2024   COVID-19 Vaccine (6 - 2025-26 season) 06/04/2024   DEXA SCAN  01/22/2025 (Originally 08/09/2020)   Medicare Annual Wellness (AWV)  08/06/2025   DTaP/Tdap/Td (2 - Td or Tdap) 07/31/2028   Pneumococcal Vaccine: 50+ Years  Completed   Zoster Vaccines- Shingrix  Completed   Meningococcal B Vaccine  Aged Out   Mammogram  Discontinued   Hepatitis C Screening  Discontinued   Fecal DNA (Cologuard)  Discontinued        Assessment/Plan:  This is a routine wellness examination for Aleigha.  Patient Care Team: Geofm Glade PARAS, MD as PCP - General (Internal Medicine)  I have personally reviewed and noted the following in the patient's chart:   Medical and social history Use of alcohol , tobacco or illicit drugs  Current medications and supplements including opioid prescriptions. Functional ability and status Nutritional status Physical activity Advanced directives List of other physicians Hospitalizations, surgeries, and ER visits in previous 12 months Vitals Screenings to include cognitive, depression, and falls Referrals and appointments  No orders of the defined types were placed in this encounter.  In addition, I have reviewed and discussed with patient certain preventive protocols, quality metrics, and best practice recommendations. A written personalized care plan for preventive services as well as general preventive health recommendations were provided to patient.   Verdie CHRISTELLA Saba, CMA   08/07/2024   Return in 1 year (on 08/06/2025).  After Visit Summary: (Declined) Due to this being a telephonic visit, with patients personalized plan was offered to patient but patient Declined AVS at this time   Nurse Notes: Scheduled a 2026 AWV/CPE appt.

## 2024-08-08 ENCOUNTER — Other Ambulatory Visit (HOSPITAL_COMMUNITY): Payer: Self-pay

## 2024-08-08 NOTE — Telephone Encounter (Signed)
 Spoke with patient today.

## 2024-08-09 ENCOUNTER — Other Ambulatory Visit (HOSPITAL_COMMUNITY): Payer: Self-pay

## 2024-08-09 NOTE — Telephone Encounter (Signed)
 Pharmacy Patient Advocate Encounter  Received notification from [employer] that Prior Authorization for Doxepin HCl 6MG  tablets   has been DENIED.  See denial reason below. No denial letter attached in CMM. Will attach denial letter to Media tab once received.  PLEASE BE ADVISE OUTCOME FORM PLAN The drug you asked for is not listed in your preferred drug list (formulary). The preferred drug(s), you may not have tried, are: Belsomra  tablet WITHOUT PT 30 DAY SUPPLY COPAY $12.15 SEE TEST BILLING BELOW   Your provider needs to give us  medical reasons why the preferred drug(s) would not work for you and/or would have bad side effects. Sometimes a preferred drug needs more review for approval. Additionally, some preferred drugs listed may be the same drugs with different strengths or forms. [employer] may only require one strength or form of that drug to be tried. PA #/Case ID/Reference #: [id number]

## 2024-08-12 MED ORDER — BELSOMRA 10 MG PO TABS: 10.0000 mg | ORAL_TABLET | Freq: Every evening | ORAL | 2 refills | Status: DC | PRN

## 2024-08-12 NOTE — Addendum Note (Signed)
 Addended by: GEOFM GLADE PARAS on: 08/12/2024 01:33 PM   Modules accepted: Orders

## 2024-08-12 NOTE — Telephone Encounter (Signed)
 Doxepin is not covered   We can try belsomra  again -- I will send a prescription to the pharmacy.  She needs to take this for at least a month to see if it works or not.  Stop trazodone .

## 2024-08-13 MED ORDER — ALPRAZOLAM 0.5 MG PO TABS: ORAL_TABLET | ORAL | 0 refills | Status: DC

## 2024-08-13 NOTE — Telephone Encounter (Signed)
 Rx sent.

## 2024-08-13 NOTE — Telephone Encounter (Signed)
 Spoke with patient today.

## 2024-08-13 NOTE — Addendum Note (Signed)
 Addended by: GEOFM GLADE PARAS on: 08/13/2024 11:47 AM   Modules accepted: Orders

## 2024-08-14 ENCOUNTER — Other Ambulatory Visit: Payer: Self-pay | Admitting: Internal Medicine

## 2024-08-14 ENCOUNTER — Ambulatory Visit

## 2024-08-14 ENCOUNTER — Ambulatory Visit: Admitting: Physician Assistant

## 2024-08-23 ENCOUNTER — Other Ambulatory Visit (HOSPITAL_COMMUNITY): Payer: Self-pay

## 2024-08-27 ENCOUNTER — Telehealth: Payer: Self-pay

## 2024-08-27 NOTE — Telephone Encounter (Signed)
 Copied from CRM [id number]. Topic: Clinical - Prescription Issue >> Aug 27, 2024  9:52 AM Dawn Kim wrote: Reason for CRM: Patient was prescribed Suvorexant  (BELSOMRA ) 10 MG TABS and begin taking it 11/11. Patient is only sleeping 3 to 4 hours per night  since taking the Belsomra . Patient is requesting an increase in her trazodone  instead. Patient CB# [phone] >> Aug 27, 2024  4:10 PM Dawn Kim wrote: Pt calling to check the status of her med increase request. Ive instructed the pt the provider has not gotten a chance to review the message but will as soon as she can.

## 2024-08-27 NOTE — Telephone Encounter (Signed)
 Copied from CRM [id number]. Topic: Clinical - Prescription Issue >> Aug 27, 2024  9:52 AM Dawn Kim wrote: Reason for CRM: Patient was prescribed Suvorexant  (BELSOMRA ) 10 MG TABS and begin taking it 11/11. Patient is only sleeping 3 to 4 hours per night  since taking the Belsomra . Patient is requesting an increase in her trazodone  instead. Patient CB# [phone]

## 2024-08-28 ENCOUNTER — Telehealth: Payer: Self-pay

## 2024-08-28 MED ORDER — TRAZODONE HCL 150 MG PO TABS
150.0000 mg | ORAL_TABLET | Freq: Every day | ORAL | 0 refills | Status: AC
Start: 2024-08-28 — End: ?

## 2024-08-28 NOTE — Addendum Note (Signed)
 Addended by: GEOFM GLADE PARAS on: 08/28/2024 02:52 PM   Modules accepted: Orders

## 2024-08-28 NOTE — Telephone Encounter (Signed)
 Okay-prescription sent to pharmacy.  Sometimes between sertraline  and trazodone  can cause an increase in sertraline  that is too much.  If she experiences any agitation, hallucinations, changes in heart rate, blood pressure, nausea or diarrhea or tremor she needs to let us  know

## 2024-08-28 NOTE — Telephone Encounter (Signed)
 Have her try taking two belsomra  at night (20 mg) to see if that works better.  If that does not work we can consider increasing trazodone  to 150 mg, but we cannot go any higher than that because she is already on sertraline .  We could consider a different medication at nighttime besides the trazodone  called mirtazapine.

## 2024-08-28 NOTE — Telephone Encounter (Signed)
 Copied from CRM [id number]. Topic: Clinical - Prescription Issue >> Aug 27, 2024  9:52 AM Darshell M wrote: Reason for CRM: Patient was prescribed Suvorexant  (BELSOMRA ) 10 MG TABS and begin taking it 11/11. Patient is only sleeping 3 to 4 hours per night  since taking the Belsomra . Patient is requesting an increase in her trazodone  instead. Patient CB# [phone] >> Aug 28, 2024 11:06 AM Pinkey ORN wrote: Patient called wanting to know rather or not Geofm, Glade PARAS, MD completed her request to increase her medication. Patient states she'll just work with the prescription she currently have.  >> Aug 27, 2024  4:10 PM Winona R wrote: Pt calling to check the status of her med increase request. Ive instructed the pt the provider has not gotten a chance to review the message but will as soon as she can.

## 2024-08-28 NOTE — Telephone Encounter (Signed)
 Addressed.

## 2024-08-28 NOTE — Telephone Encounter (Addendum)
 See other phone note

## 2024-09-01 ENCOUNTER — Other Ambulatory Visit: Payer: Self-pay | Admitting: Internal Medicine

## 2024-09-01 DIAGNOSIS — F419 Anxiety disorder, unspecified: Secondary | ICD-10-CM

## 2024-09-03 ENCOUNTER — Other Ambulatory Visit: Payer: Self-pay | Admitting: Internal Medicine

## 2024-09-03 ENCOUNTER — Telehealth: Payer: Self-pay

## 2024-09-03 DIAGNOSIS — F419 Anxiety disorder, unspecified: Secondary | ICD-10-CM

## 2024-09-03 MED ORDER — ALPRAZOLAM 0.5 MG PO TABS: ORAL_TABLET | ORAL | 0 refills | Status: DC

## 2024-09-03 NOTE — Telephone Encounter (Signed)
 Xanax refilled.

## 2024-09-10 ENCOUNTER — Other Ambulatory Visit: Payer: Self-pay | Admitting: Internal Medicine

## 2024-10-02 ENCOUNTER — Other Ambulatory Visit: Payer: Self-pay | Admitting: Internal Medicine

## 2024-10-02 DIAGNOSIS — F419 Anxiety disorder, unspecified: Secondary | ICD-10-CM

## 2024-10-31 ENCOUNTER — Other Ambulatory Visit: Payer: Self-pay | Admitting: Internal Medicine

## 2024-10-31 DIAGNOSIS — F419 Anxiety disorder, unspecified: Secondary | ICD-10-CM

## 2024-11-20 ENCOUNTER — Ambulatory Visit: Admitting: Internal Medicine

## 2025-08-09 ENCOUNTER — Ambulatory Visit

## 2025-08-09 ENCOUNTER — Encounter: Admitting: Internal Medicine
